# Patient Record
Sex: Male | Born: 1955
Health system: Southern US, Community
[De-identification: ages and names within clinical notes are randomized; demographics above are authoritative.]

## PROBLEM LIST (undated history)

## (undated) DIAGNOSIS — E039 Hypothyroidism, unspecified: Secondary | ICD-10-CM

## (undated) DIAGNOSIS — K219 Gastro-esophageal reflux disease without esophagitis: Secondary | ICD-10-CM

## (undated) DIAGNOSIS — C801 Malignant (primary) neoplasm, unspecified: Secondary | ICD-10-CM

## (undated) DIAGNOSIS — Z974 Presence of external hearing-aid: Secondary | ICD-10-CM

## (undated) DIAGNOSIS — Z972 Presence of dental prosthetic device (complete) (partial): Secondary | ICD-10-CM

## (undated) DIAGNOSIS — M1990 Unspecified osteoarthritis, unspecified site: Secondary | ICD-10-CM

## (undated) DIAGNOSIS — E079 Disorder of thyroid, unspecified: Secondary | ICD-10-CM

## (undated) DIAGNOSIS — I10 Essential (primary) hypertension: Secondary | ICD-10-CM

## (undated) DIAGNOSIS — E785 Hyperlipidemia, unspecified: Secondary | ICD-10-CM

## (undated) HISTORY — DX: Essential (primary) hypertension: I10

## (undated) HISTORY — PX: CHOLECYSTECTOMY: SHX55

## (undated) HISTORY — DX: Malignant (primary) neoplasm, unspecified: C80.1

## (undated) HISTORY — DX: Unspecified osteoarthritis, unspecified site: M19.90

## (undated) HISTORY — DX: Disorder of thyroid, unspecified: E07.9

## (undated) HISTORY — DX: Gastro-esophageal reflux disease without esophagitis: K21.9

## (undated) HISTORY — PX: COLONOSCOPY: SHX174

---

## 1991-09-28 HISTORY — PX: REPLACEMENT TOTAL KNEE: SUR1224

## 2001-09-27 DIAGNOSIS — C801 Malignant (primary) neoplasm, unspecified: Secondary | ICD-10-CM

## 2001-09-27 HISTORY — DX: Malignant (primary) neoplasm, unspecified: C80.1

## 2001-09-27 HISTORY — PX: SINUS SURGERY WITH INSTATRAK: SHX5215

## 2004-06-29 ENCOUNTER — Ambulatory Visit: Payer: Self-pay | Admitting: Oncology

## 2004-07-28 ENCOUNTER — Ambulatory Visit: Payer: Self-pay | Admitting: Oncology

## 2004-09-09 ENCOUNTER — Ambulatory Visit: Payer: Self-pay | Admitting: Otolaryngology

## 2004-10-27 ENCOUNTER — Ambulatory Visit: Payer: Self-pay | Admitting: Oncology

## 2004-11-30 ENCOUNTER — Other Ambulatory Visit: Payer: Self-pay

## 2004-12-07 ENCOUNTER — Inpatient Hospital Stay: Payer: Self-pay | Admitting: Unknown Physician Specialty

## 2005-01-26 ENCOUNTER — Ambulatory Visit: Payer: Self-pay | Admitting: Oncology

## 2005-02-25 ENCOUNTER — Ambulatory Visit: Payer: Self-pay | Admitting: Oncology

## 2005-05-10 ENCOUNTER — Ambulatory Visit: Payer: Self-pay | Admitting: Unknown Physician Specialty

## 2005-05-25 ENCOUNTER — Ambulatory Visit: Payer: Self-pay | Admitting: Pain Medicine

## 2005-06-08 ENCOUNTER — Ambulatory Visit: Payer: Self-pay | Admitting: Pain Medicine

## 2005-06-10 ENCOUNTER — Ambulatory Visit: Payer: Self-pay | Admitting: Oncology

## 2005-06-22 ENCOUNTER — Ambulatory Visit: Payer: Self-pay | Admitting: Pain Medicine

## 2005-06-27 ENCOUNTER — Ambulatory Visit: Payer: Self-pay | Admitting: Oncology

## 2005-07-06 ENCOUNTER — Ambulatory Visit: Payer: Self-pay | Admitting: Physician Assistant

## 2005-12-07 ENCOUNTER — Ambulatory Visit: Payer: Self-pay | Admitting: Oncology

## 2005-12-26 ENCOUNTER — Ambulatory Visit: Payer: Self-pay | Admitting: Oncology

## 2006-06-15 ENCOUNTER — Ambulatory Visit: Payer: Self-pay | Admitting: Oncology

## 2006-06-27 ENCOUNTER — Ambulatory Visit: Payer: Self-pay | Admitting: Oncology

## 2006-10-14 ENCOUNTER — Ambulatory Visit: Payer: Self-pay | Admitting: Ophthalmology

## 2006-12-21 ENCOUNTER — Ambulatory Visit: Payer: Self-pay | Admitting: Oncology

## 2006-12-27 ENCOUNTER — Ambulatory Visit: Payer: Self-pay | Admitting: Oncology

## 2007-03-10 ENCOUNTER — Emergency Department: Payer: Self-pay | Admitting: Emergency Medicine

## 2007-03-10 ENCOUNTER — Other Ambulatory Visit: Payer: Self-pay

## 2007-04-12 ENCOUNTER — Ambulatory Visit: Payer: Self-pay | Admitting: Unknown Physician Specialty

## 2007-05-29 ENCOUNTER — Ambulatory Visit: Payer: Self-pay | Admitting: Oncology

## 2007-06-21 ENCOUNTER — Ambulatory Visit: Payer: Self-pay | Admitting: Oncology

## 2007-06-28 ENCOUNTER — Ambulatory Visit: Payer: Self-pay | Admitting: Oncology

## 2007-06-29 ENCOUNTER — Ambulatory Visit: Payer: Self-pay | Admitting: Gastroenterology

## 2007-11-26 ENCOUNTER — Ambulatory Visit: Payer: Self-pay | Admitting: Oncology

## 2007-12-20 ENCOUNTER — Ambulatory Visit: Payer: Self-pay | Admitting: Oncology

## 2007-12-27 ENCOUNTER — Ambulatory Visit: Payer: Self-pay | Admitting: Oncology

## 2008-01-26 ENCOUNTER — Ambulatory Visit: Payer: Self-pay | Admitting: Oncology

## 2008-03-03 ENCOUNTER — Emergency Department: Payer: Self-pay | Admitting: Emergency Medicine

## 2008-06-27 ENCOUNTER — Ambulatory Visit: Payer: Self-pay | Admitting: Oncology

## 2008-07-25 ENCOUNTER — Ambulatory Visit: Payer: Self-pay | Admitting: Oncology

## 2008-07-28 ENCOUNTER — Ambulatory Visit: Payer: Self-pay | Admitting: Oncology

## 2008-12-26 ENCOUNTER — Ambulatory Visit: Payer: Self-pay | Admitting: Oncology

## 2009-01-23 ENCOUNTER — Ambulatory Visit: Payer: Self-pay | Admitting: Oncology

## 2009-01-25 ENCOUNTER — Ambulatory Visit: Payer: Self-pay | Admitting: Oncology

## 2009-02-25 ENCOUNTER — Ambulatory Visit: Payer: Self-pay | Admitting: Oncology

## 2009-03-08 ENCOUNTER — Ambulatory Visit: Payer: Self-pay | Admitting: Otolaryngology

## 2009-03-27 ENCOUNTER — Ambulatory Visit: Payer: Self-pay | Admitting: Oncology

## 2009-04-23 ENCOUNTER — Ambulatory Visit: Payer: Self-pay | Admitting: Oncology

## 2009-04-27 ENCOUNTER — Ambulatory Visit: Payer: Self-pay | Admitting: Oncology

## 2009-08-07 ENCOUNTER — Ambulatory Visit: Payer: Self-pay | Admitting: Unknown Physician Specialty

## 2009-08-27 ENCOUNTER — Ambulatory Visit: Payer: Self-pay | Admitting: Oncology

## 2009-08-29 ENCOUNTER — Ambulatory Visit: Payer: Self-pay | Admitting: Oncology

## 2009-09-27 ENCOUNTER — Ambulatory Visit: Payer: Self-pay | Admitting: Oncology

## 2009-09-27 HISTORY — PX: BACK SURGERY: SHX140

## 2009-10-06 ENCOUNTER — Ambulatory Visit: Payer: Self-pay | Admitting: Otolaryngology

## 2010-01-12 ENCOUNTER — Emergency Department: Payer: Self-pay | Admitting: Emergency Medicine

## 2010-02-25 ENCOUNTER — Ambulatory Visit: Payer: Self-pay | Admitting: Oncology

## 2010-02-27 ENCOUNTER — Ambulatory Visit: Payer: Self-pay | Admitting: Oncology

## 2010-03-27 ENCOUNTER — Ambulatory Visit: Payer: Self-pay | Admitting: Oncology

## 2010-05-20 ENCOUNTER — Ambulatory Visit: Payer: Self-pay | Admitting: Otolaryngology

## 2010-07-13 ENCOUNTER — Ambulatory Visit: Payer: Self-pay | Admitting: Oncology

## 2010-07-28 ENCOUNTER — Ambulatory Visit: Payer: Self-pay | Admitting: Oncology

## 2011-01-12 ENCOUNTER — Ambulatory Visit: Payer: Self-pay | Admitting: Oncology

## 2011-01-26 ENCOUNTER — Ambulatory Visit: Payer: Self-pay | Admitting: Oncology

## 2011-07-15 ENCOUNTER — Ambulatory Visit: Payer: Self-pay | Admitting: Oncology

## 2011-07-29 ENCOUNTER — Ambulatory Visit: Payer: Self-pay | Admitting: Oncology

## 2011-11-29 ENCOUNTER — Ambulatory Visit: Payer: Self-pay | Admitting: Unknown Physician Specialty

## 2011-12-10 ENCOUNTER — Ambulatory Visit: Payer: Self-pay | Admitting: Unknown Physician Specialty

## 2011-12-10 LAB — URINALYSIS, COMPLETE
Bacteria: NONE SEEN
Bilirubin,UR: NEGATIVE
Blood: NEGATIVE
Glucose,UR: NEGATIVE mg/dL (ref 0–75)
Ketone: NEGATIVE
Leukocyte Esterase: NEGATIVE
Nitrite: NEGATIVE
Ph: 6 (ref 4.5–8.0)
Protein: NEGATIVE
RBC,UR: 1 /HPF (ref 0–5)
Specific Gravity: 1.012 (ref 1.003–1.030)
Squamous Epithelial: NONE SEEN
WBC UR: 1 /HPF (ref 0–5)

## 2011-12-10 LAB — BASIC METABOLIC PANEL
Anion Gap: 12 (ref 7–16)
BUN: 12 mg/dL (ref 7–18)
Calcium, Total: 9.2 mg/dL (ref 8.5–10.1)
Chloride: 100 mmol/L (ref 98–107)
Co2: 26 mmol/L (ref 21–32)
Creatinine: 0.92 mg/dL (ref 0.60–1.30)
EGFR (African American): >60
EGFR (Non-African Amer.): >60
Glucose: 107 mg/dL — ABNORMAL HIGH (ref 65–99)
Osmolality: 276 (ref 275–301)
Potassium: 3.7 mmol/L (ref 3.5–5.1)
Sodium: 138 mmol/L (ref 136–145)

## 2011-12-10 LAB — CBC
HCT: 39.9 % — ABNORMAL LOW (ref 40.0–52.0)
HGB: 13.5 g/dL (ref 13.0–18.0)
MCH: 30.1 pg (ref 26.0–34.0)
MCHC: 34 g/dL (ref 32.0–36.0)
MCV: 89 fL (ref 80–100)
Platelet: 280 10*3/uL (ref 150–440)
RBC: 4.5 10*6/uL (ref 4.40–5.90)
RDW: 13.6 % (ref 11.5–14.5)
WBC: 5.9 10*3/uL (ref 3.8–10.6)

## 2011-12-10 LAB — MRSA PCR SCREENING

## 2011-12-17 ENCOUNTER — Ambulatory Visit: Payer: Self-pay | Admitting: Unknown Physician Specialty

## 2012-01-13 ENCOUNTER — Ambulatory Visit: Payer: Self-pay | Admitting: Oncology

## 2012-01-13 LAB — COMPREHENSIVE METABOLIC PANEL
Albumin: 4.4 g/dL (ref 3.4–5.0)
Alkaline Phosphatase: 71 U/L (ref 50–136)
Anion Gap: 7 (ref 7–16)
BUN: 15 mg/dL (ref 7–18)
Bilirubin,Total: 0.4 mg/dL (ref 0.2–1.0)
Calcium, Total: 9.1 mg/dL (ref 8.5–10.1)
Chloride: 102 mmol/L (ref 98–107)
Co2: 29 mmol/L (ref 21–32)
Creatinine: 1 mg/dL (ref 0.60–1.30)
EGFR (African American): >60
EGFR (Non-African Amer.): >60
Glucose: 100 mg/dL — ABNORMAL HIGH (ref 65–99)
Osmolality: 277 (ref 275–301)
Potassium: 3.7 mmol/L (ref 3.5–5.1)
SGOT(AST): 23 U/L (ref 15–37)
SGPT (ALT): 32 U/L
Sodium: 138 mmol/L (ref 136–145)
Total Protein: 7.6 g/dL (ref 6.4–8.2)

## 2012-01-13 LAB — CBC CANCER CENTER
Basophil #: 0 x10 3/mm (ref 0.0–0.1)
Basophil %: 0.9 %
Eosinophil #: 0.2 x10 3/mm (ref 0.0–0.7)
Eosinophil %: 4.1 %
HCT: 40.6 % (ref 40.0–52.0)
HGB: 13.5 g/dL (ref 13.0–18.0)
Lymphocyte #: 2.2 x10 3/mm (ref 1.0–3.6)
Lymphocyte %: 39.6 %
MCH: 29.5 pg (ref 26.0–34.0)
MCHC: 33.3 g/dL (ref 32.0–36.0)
MCV: 88 fL (ref 80–100)
Monocyte #: 0.4 x10 3/mm (ref 0.2–1.0)
Monocyte %: 7.7 %
Neutrophil #: 2.6 x10 3/mm (ref 1.4–6.5)
Neutrophil %: 47.7 %
Platelet: 250 x10 3/mm (ref 150–440)
RBC: 4.59 10*6/uL (ref 4.40–5.90)
RDW: 13.7 % (ref 11.5–14.5)
WBC: 5.5 x10 3/mm (ref 3.8–10.6)

## 2012-01-13 LAB — TSH: Thyroid Stimulating Horm: 1.18 u[IU]/mL

## 2012-01-26 ENCOUNTER — Ambulatory Visit: Payer: Self-pay | Admitting: Oncology

## 2012-07-18 ENCOUNTER — Ambulatory Visit: Payer: Self-pay | Admitting: Oncology

## 2012-07-18 LAB — COMPREHENSIVE METABOLIC PANEL
Albumin: 4.3 g/dL (ref 3.4–5.0)
Alkaline Phosphatase: 79 U/L (ref 50–136)
Anion Gap: 10 (ref 7–16)
BUN: 10 mg/dL (ref 7–18)
Bilirubin,Total: 0.5 mg/dL (ref 0.2–1.0)
Calcium, Total: 9.3 mg/dL (ref 8.5–10.1)
Chloride: 101 mmol/L (ref 98–107)
Co2: 27 mmol/L (ref 21–32)
Creatinine: 1.03 mg/dL (ref 0.60–1.30)
EGFR (African American): >60
EGFR (Non-African Amer.): >60
Glucose: 94 mg/dL (ref 65–99)
Osmolality: 274 (ref 275–301)
Potassium: 4.1 mmol/L (ref 3.5–5.1)
SGOT(AST): 21 U/L (ref 15–37)
SGPT (ALT): 35 U/L (ref 12–78)
Sodium: 138 mmol/L (ref 136–145)
Total Protein: 7.5 g/dL (ref 6.4–8.2)

## 2012-07-18 LAB — CBC CANCER CENTER
Basophil #: 0.1 x10 3/mm (ref 0.0–0.1)
Basophil %: 1 %
Eosinophil #: 0.1 x10 3/mm (ref 0.0–0.7)
Eosinophil %: 2.3 %
HCT: 40.6 % (ref 40.0–52.0)
HGB: 13.5 g/dL (ref 13.0–18.0)
Lymphocyte #: 1.9 x10 3/mm (ref 1.0–3.6)
Lymphocyte %: 32.4 %
MCH: 29 pg (ref 26.0–34.0)
MCHC: 33.2 g/dL (ref 32.0–36.0)
MCV: 87 fL (ref 80–100)
Monocyte #: 0.6 x10 3/mm (ref 0.2–1.0)
Monocyte %: 11 %
Neutrophil #: 3.1 x10 3/mm (ref 1.4–6.5)
Neutrophil %: 53.3 %
Platelet: 273 x10 3/mm (ref 150–440)
RBC: 4.65 10*6/uL (ref 4.40–5.90)
RDW: 13.5 % (ref 11.5–14.5)
WBC: 5.7 x10 3/mm (ref 3.8–10.6)

## 2012-07-18 LAB — TSH: Thyroid Stimulating Horm: 0.625 u[IU]/mL

## 2012-07-28 ENCOUNTER — Ambulatory Visit: Payer: Self-pay | Admitting: Oncology

## 2012-11-22 HISTORY — PX: BREAST SURGERY: SHX581

## 2012-12-05 ENCOUNTER — Encounter: Payer: Self-pay | Admitting: *Deleted

## 2012-12-14 ENCOUNTER — Encounter: Payer: Self-pay | Admitting: General Surgery

## 2012-12-14 ENCOUNTER — Ambulatory Visit (INDEPENDENT_AMBULATORY_CARE_PROVIDER_SITE_OTHER): Payer: PRIVATE HEALTH INSURANCE | Admitting: General Surgery

## 2012-12-14 VITALS — BP 138/90 | HR 88 | Resp 14 | Ht 73.0 in | Wt 162.0 lb

## 2012-12-14 DIAGNOSIS — N62 Hypertrophy of breast: Secondary | ICD-10-CM

## 2012-12-14 NOTE — Progress Notes (Addendum)
Subjective:     Patient ID: Marcus Wright, male   DOB: 1955-12-29, 57 y.o.   MRN: [id number]  HPI Patient here today following up from a right breast biopsy done 11-22-12, benign. Currently being treated for ear infection but has not started medications yet saw Dr Willeen Cass today.   Review of Systems  Constitutional: Negative.   HENT: Positive for ear pain.   Respiratory: Negative.   Cardiovascular: Negative.        Objective:   Physical Exam Thickening behind right areolar areais about 2.5-3.0 cm in diameter and less tenderthat at the time of his original evaluation on February 26.     Assessment:     Gynecomastia     Plan:     The options for management were reviewed: 1) expectant management as he has had a previous episode resolved spontaneously in the past versus 2) treatment with tamoxifen. The slight risk of DVT with this medication was reviewed.  As the patient is presently asymptomatic, observation will be planned. He was encouraged to call if he develops discomfort in the interval or appreciates an increasing size. We'll plan for a repeat exam in 6 months with an ultrasound at that time.

## 2012-12-14 NOTE — Patient Instructions (Addendum)
Discussed with patient hormone therapy vs conservative observation.

## 2013-03-23 ENCOUNTER — Ambulatory Visit: Payer: Self-pay | Admitting: Otolaryngology

## 2013-06-20 ENCOUNTER — Other Ambulatory Visit (INDEPENDENT_AMBULATORY_CARE_PROVIDER_SITE_OTHER): Payer: Medicare Other

## 2013-06-20 ENCOUNTER — Encounter: Payer: Self-pay | Admitting: General Surgery

## 2013-06-20 ENCOUNTER — Ambulatory Visit (INDEPENDENT_AMBULATORY_CARE_PROVIDER_SITE_OTHER): Payer: Medicare Other | Admitting: General Surgery

## 2013-06-20 VITALS — BP 136/74 | HR 78 | Resp 12 | Ht 73.0 in | Wt 196.0 lb

## 2013-06-20 DIAGNOSIS — N62 Hypertrophy of breast: Secondary | ICD-10-CM | POA: Insufficient documentation

## 2013-06-20 NOTE — Progress Notes (Signed)
Patient ID: Marcus Wright, male   DOB: [birth year], 57 y.o.   MRN: [id number]  Chief Complaint  Patient presents with  . Follow-up    office ultrasound    HPI Marcus Wright is a 57 y.o. male.  Here today for right breast ultrasound.  A right breast biopsy was done 11-22-12, showing gynecomastia.  The last few months he does feel that the left breast is developing a focal area of thickening similar to that which had previously been identified on the right.  He is having difficulty with sinus infections presently being managed by the ENT service. 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  HPI  Past Medical History  Diagnosis Date  . Hypertension   . Cancer 2003    skin'  . Thyroid disorder     Past Surgical History  Procedure Laterality Date  . Sinus surgery with instatrak  2003    cancer  . Back surgery  2011  . Replacement total knee Right 1993  . Cholecystectomy    . Colonoscopy    . Breast surgery Right 11-22-12    History reviewed. No pertinent family history.  Social History History  Substance Use Topics  . Smoking status: Never Smoker   . Smokeless tobacco: Never Used  . Alcohol Use: No    No Known Allergies  Current Outpatient Prescriptions  Medication Sig Dispense Refill  . amLODipine (NORVASC) 5 MG tablet Take 5 mg by mouth as needed.      Marland Kitchen esomeprazole (NEXIUM) 20 MG capsule Take 20 mg by mouth daily before breakfast.      . levothyroxine (SYNTHROID, LEVOTHROID) 75 MCG tablet Take 75 mcg by mouth daily.       No current facility-administered medications for this visit.    Review of Systems Review of Systems  Constitutional: Negative.   Respiratory: Negative.   Cardiovascular: Negative.     Blood pressure 136/74, pulse 78, resp. rate 12, height 6\' 1"  (1.854 m), weight 196 lb (88.905 kg).  Physical Exam Physical Exam  Constitutional: He is oriented to person, place, and time. He appears  well-developed and well-nourished.  Neck: No thyromegaly present.  Cardiovascular: Normal rate, regular rhythm and normal heart sounds.   No murmur heard. Pulmonary/Chest: Effort normal and breath sounds normal.  There is modest thickening behind the left areola measuring less than 2 cm in diameter this is fairly soft   Minimal thickening behind the right areola, much less prominent than prior to his biopsy.  Lymphadenopathy:    He has no cervical adenopathy.    He has no axillary adenopathy.  Neurological: He is alert and oriented to person, place, and time.  Skin: Skin is dry.    Data Reviewed Ultrasound examination of the retroareolar area of both breasts was completed. On the left side the tissue behind the nipple/areolar complex measures 1.25 x 1.5 one bite 1.1 cm in maximum dimension.  In the right breast the area measures 1.26 x 1.43 x 1.79 cm.  At the time of his March 2014 examination ultrasound examination of the right breast showed an irregular hypoechoic area measuring 1 x 1.5 x 2.5 cm.   Assessment    Gynecomastia    Plan    Considering the previous right breast biopsy showed only gynecomastia, and the similar clinical and ultrasound appearance on today's exam for the left breast, observation as planned. A repeat ultrasound of both breasts will be scheduled in 6 months.  Earline Mayotte 06/21/2013, 7:13 AM

## 2013-06-20 NOTE — Addendum Note (Signed)
Addended by: Currie Paris on: 06/20/2013 04:17 PM   Modules accepted: Medications

## 2013-06-21 ENCOUNTER — Encounter: Payer: Self-pay | Admitting: General Surgery

## 2013-08-31 ENCOUNTER — Emergency Department: Payer: Self-pay | Admitting: Emergency Medicine

## 2013-12-17 ENCOUNTER — Encounter: Payer: Self-pay | Admitting: General Surgery

## 2013-12-17 ENCOUNTER — Ambulatory Visit (INDEPENDENT_AMBULATORY_CARE_PROVIDER_SITE_OTHER): Payer: Medicare Other | Admitting: General Surgery

## 2013-12-17 VITALS — BP 120/74 | HR 74 | Resp 12 | Ht 73.0 in | Wt 197.0 lb

## 2013-12-17 DIAGNOSIS — N62 Hypertrophy of breast: Secondary | ICD-10-CM

## 2013-12-17 NOTE — Patient Instructions (Signed)
Patient to return as needed. The patient is aware to call back for any questions or concerns. 

## 2013-12-17 NOTE — Progress Notes (Signed)
Patient ID: Marcus Wright, male   DOB: 1956-07-14, 58 y.o.   MRN: [id number]  Chief Complaint  Patient presents with  . Follow-up    follow up for gynocomastia     HPI Marcus Wright is a 58 y.o. male who presents for a 6 month for gynecomastia. The patient denies any new breast problems at this time. He states he thinks his lumps have resolved themselves.   HPI  Past Medical History  Diagnosis Date  . Hypertension   . Cancer 2003    skin'  . Thyroid disorder     Past Surgical History  Procedure Laterality Date  . Sinus surgery with instatrak  2003    cancer  . Back surgery  2011  . Replacement total knee Right 1993  . Cholecystectomy    . Colonoscopy    . Breast surgery Right 11-22-12    No family history on file.  Social History History  Substance Use Topics  . Smoking status: Never Smoker   . Smokeless tobacco: Never Used  . Alcohol Use: No    No Known Allergies  Current Outpatient Prescriptions  Medication Sig Dispense Refill  . amLODipine (NORVASC) 5 MG tablet Take 5 mg by mouth as needed.      Marland Kitchen esomeprazole (NEXIUM) 20 MG capsule Take 20 mg by mouth daily before breakfast.      . levothyroxine (SYNTHROID, LEVOTHROID) 75 MCG tablet Take 75 mcg by mouth daily.       No current facility-administered medications for this visit.    Review of Systems Review of Systems  Constitutional: Negative.   Respiratory: Negative.   Cardiovascular: Negative.     Blood pressure 120/74, pulse 74, resp. rate 12, height 6\' 1"  (1.854 m), weight 197 lb (89.359 kg).  Physical Exam Physical Exam  Constitutional: He is oriented to person, place, and time. He appears well-developed and well-nourished.  Neck: Neck supple. No thyromegaly present.  Cardiovascular: Normal rate, regular rhythm and normal heart sounds.   No murmur heard. Pulmonary/Chest: Effort normal and breath sounds normal. Right breast exhibits no inverted nipple, no mass, no nipple discharge, no skin change  and no tenderness. Left breast exhibits no inverted nipple, no mass, no nipple discharge, no skin change and no tenderness.    Lymphadenopathy:    He has no cervical adenopathy.    He has no axillary adenopathy.  Neurological: He is alert and oriented to person, place, and time.  Skin: Skin is warm and dry.    Data Reviewed November 22, 2012 right retroareolar tissue biopsy showed evidence of gynecomastia.  Assessment    Benign breast exam. No evidence of nodularity or pronounced thickening working repeat ultrasound.     Plan    The patient was encouraged to call should he develop any recurrent symptoms or appreciate any thickening. Otherwise he'll return on an as needed basis.        Marcus Wright 12/17/2013, 9:04 PM

## 2013-12-19 ENCOUNTER — Encounter: Payer: Self-pay | Admitting: General Surgery

## 2014-02-07 DIAGNOSIS — M5410 Radiculopathy, site unspecified: Secondary | ICD-10-CM | POA: Insufficient documentation

## 2014-02-07 DIAGNOSIS — Z9889 Other specified postprocedural states: Secondary | ICD-10-CM | POA: Insufficient documentation

## 2014-02-07 DIAGNOSIS — M5412 Radiculopathy, cervical region: Secondary | ICD-10-CM | POA: Insufficient documentation

## 2014-02-07 DIAGNOSIS — S161XXA Strain of muscle, fascia and tendon at neck level, initial encounter: Secondary | ICD-10-CM | POA: Insufficient documentation

## 2014-02-27 DIAGNOSIS — S299XXA Unspecified injury of thorax, initial encounter: Secondary | ICD-10-CM | POA: Insufficient documentation

## 2014-02-27 DIAGNOSIS — IMO0002 Reserved for concepts with insufficient information to code with codable children: Secondary | ICD-10-CM | POA: Insufficient documentation

## 2014-06-06 ENCOUNTER — Ambulatory Visit: Payer: Self-pay | Admitting: Orthopedic Surgery

## 2014-07-04 DIAGNOSIS — J301 Allergic rhinitis due to pollen: Secondary | ICD-10-CM | POA: Insufficient documentation

## 2014-11-04 DIAGNOSIS — H6981 Other specified disorders of Eustachian tube, right ear: Secondary | ICD-10-CM | POA: Diagnosis not present

## 2014-11-04 DIAGNOSIS — H6531 Chronic mucoid otitis media, right ear: Secondary | ICD-10-CM | POA: Diagnosis not present

## 2014-11-04 DIAGNOSIS — H902 Conductive hearing loss, unspecified: Secondary | ICD-10-CM | POA: Diagnosis not present

## 2014-11-27 DIAGNOSIS — E039 Hypothyroidism, unspecified: Secondary | ICD-10-CM | POA: Diagnosis not present

## 2014-11-27 DIAGNOSIS — M1990 Unspecified osteoarthritis, unspecified site: Secondary | ICD-10-CM | POA: Diagnosis not present

## 2014-11-27 DIAGNOSIS — Z23 Encounter for immunization: Secondary | ICD-10-CM | POA: Diagnosis not present

## 2014-11-27 DIAGNOSIS — N400 Enlarged prostate without lower urinary tract symptoms: Secondary | ICD-10-CM | POA: Diagnosis not present

## 2014-11-27 DIAGNOSIS — I10 Essential (primary) hypertension: Secondary | ICD-10-CM | POA: Diagnosis not present

## 2014-11-27 DIAGNOSIS — R12 Heartburn: Secondary | ICD-10-CM | POA: Diagnosis not present

## 2014-11-28 DIAGNOSIS — E039 Hypothyroidism, unspecified: Secondary | ICD-10-CM | POA: Diagnosis not present

## 2014-12-09 DIAGNOSIS — H9193 Unspecified hearing loss, bilateral: Secondary | ICD-10-CM | POA: Diagnosis not present

## 2014-12-09 DIAGNOSIS — H6504 Acute serous otitis media, recurrent, right ear: Secondary | ICD-10-CM | POA: Diagnosis not present

## 2014-12-30 DIAGNOSIS — H6504 Acute serous otitis media, recurrent, right ear: Secondary | ICD-10-CM | POA: Diagnosis not present

## 2014-12-31 DIAGNOSIS — H40001 Preglaucoma, unspecified, right eye: Secondary | ICD-10-CM | POA: Diagnosis not present

## 2015-01-02 DIAGNOSIS — H40001 Preglaucoma, unspecified, right eye: Secondary | ICD-10-CM | POA: Diagnosis not present

## 2015-01-13 DIAGNOSIS — H663X1 Other chronic suppurative otitis media, right ear: Secondary | ICD-10-CM | POA: Diagnosis not present

## 2015-01-13 DIAGNOSIS — H6531 Chronic mucoid otitis media, right ear: Secondary | ICD-10-CM | POA: Diagnosis not present

## 2015-01-13 DIAGNOSIS — H906 Mixed conductive and sensorineural hearing loss, bilateral: Secondary | ICD-10-CM | POA: Diagnosis not present

## 2015-01-13 DIAGNOSIS — Z79899 Other long term (current) drug therapy: Secondary | ICD-10-CM | POA: Diagnosis not present

## 2015-01-13 DIAGNOSIS — Z923 Personal history of irradiation: Secondary | ICD-10-CM | POA: Diagnosis not present

## 2015-01-13 DIAGNOSIS — Z87891 Personal history of nicotine dependence: Secondary | ICD-10-CM | POA: Diagnosis not present

## 2015-01-19 NOTE — Op Note (Signed)
PATIENT NAME:  Marcus Wright, Marcus Wright MR#:  [id number] DATE OF BIRTH:  1956/07/02  DATE OF PROCEDURE:  12/17/2011  PREOPERATIVE DIAGNOSIS: Right L5 radiculopathy with L4-L5 spondylosis.   POSTOPERATIVE DIAGNOSIS: Right L5 radiculopathy with L4-L5 spondylosis.   PROCEDURE PERFORMED: Right L4-L5 microdiskectomy, partial facetectomy and foraminotomy with diskectomy.   SURGEON: Alysia Penna. Vernette Moise, MD   ASSISTANT: None.   ANESTHESIA: General.   ESTIMATED BLOOD LOSS: 25 mL.  REPLACEMENT: 1100 mL of crystalloid.   DRAINS: None.   COMPLICATIONS: None.   BRIEF CLINICAL NOTE AND PATHOLOGY: The patient had persistent radiating leg pain unresponsive to conservative treatment. Work-up showed evidence of lateral disk at L4-L5. The options, risks, and benefits were discussed, and the patient elected to proceed with the operative procedure. At time of the procedure, there was significant lateral disk protrusion as well as facet hypertrophy. There was significant compression of the L5 nerve root.   DESCRIPTION OF PROCEDURE: Preop antibiotics, adequate general anesthesia, prone position, all prominences well padded. Routine prepping and draping. Appropriate timeout was called. With the use of the C-arm and a spinal needle, appropriate level was marked. Routine posterior approach was made. The fascia as opened in line with the incision. Subperiosteal dissection was performed. Self-retaining retractor was placed. Lateral fluoroscopy again was used to identify the appropriate level.   The microscope was brought onto the field. The bur was used, partial facetectomy and foraminotomy were performed, and the ligamentum flavum was carefully elevated. The nerve root was decompressed, was mobilized medially. Disk space was entered through the lateral protrusion. Cleaning was continued until three passes revealed no further disk material. The area was thoroughly irrigated, hemostasis was good. The area was covered with Surgiflo.  The fascia was closed with #2 quill. Subcutaneous was closed with 0 quill. The skin was closed with skin glue. A soft sterile dressing was applied. Sponge, needle and instrument counts were reported as correct prior to and after wound closure. The patient was awakened and taken to the postanesthesia care unit having tolerated the procedure well.   ____________________________ Alysia Penna. Mauri Pole, MD jcc:cbb D: 12/17/2011 18:05:45 ET T: 12/18/2011 12:13:42 ET JOB#: [id number]  cc: Alysia Penna. Mauri Pole, MD, <Dictator> Alysia Penna Nathanael Krist MD ELECTRONICALLY SIGNED 12/20/2011 9:21

## 2015-03-03 DIAGNOSIS — Z9889 Other specified postprocedural states: Secondary | ICD-10-CM | POA: Diagnosis not present

## 2015-03-03 DIAGNOSIS — H9193 Unspecified hearing loss, bilateral: Secondary | ICD-10-CM | POA: Diagnosis not present

## 2015-03-03 DIAGNOSIS — H906 Mixed conductive and sensorineural hearing loss, bilateral: Secondary | ICD-10-CM | POA: Diagnosis not present

## 2015-03-03 DIAGNOSIS — H6531 Chronic mucoid otitis media, right ear: Secondary | ICD-10-CM | POA: Diagnosis not present

## 2015-03-03 DIAGNOSIS — Z85819 Personal history of malignant neoplasm of unspecified site of lip, oral cavity, and pharynx: Secondary | ICD-10-CM | POA: Diagnosis not present

## 2015-04-30 DIAGNOSIS — H6531 Chronic mucoid otitis media, right ear: Secondary | ICD-10-CM | POA: Diagnosis not present

## 2015-04-30 DIAGNOSIS — H6981 Other specified disorders of Eustachian tube, right ear: Secondary | ICD-10-CM | POA: Diagnosis not present

## 2015-04-30 DIAGNOSIS — H902 Conductive hearing loss, unspecified: Secondary | ICD-10-CM | POA: Diagnosis not present

## 2015-05-20 DIAGNOSIS — H903 Sensorineural hearing loss, bilateral: Secondary | ICD-10-CM | POA: Insufficient documentation

## 2015-07-03 DIAGNOSIS — H40001 Preglaucoma, unspecified, right eye: Secondary | ICD-10-CM | POA: Diagnosis not present

## 2015-07-31 DIAGNOSIS — H6533 Chronic mucoid otitis media, bilateral: Secondary | ICD-10-CM | POA: Diagnosis not present

## 2015-07-31 DIAGNOSIS — H6983 Other specified disorders of Eustachian tube, bilateral: Secondary | ICD-10-CM | POA: Diagnosis not present

## 2015-07-31 DIAGNOSIS — R1314 Dysphagia, pharyngoesophageal phase: Secondary | ICD-10-CM | POA: Diagnosis not present

## 2015-07-31 DIAGNOSIS — H902 Conductive hearing loss, unspecified: Secondary | ICD-10-CM | POA: Diagnosis not present

## 2015-09-10 DIAGNOSIS — J069 Acute upper respiratory infection, unspecified: Secondary | ICD-10-CM | POA: Diagnosis not present

## 2015-09-10 DIAGNOSIS — J040 Acute laryngitis: Secondary | ICD-10-CM | POA: Diagnosis not present

## 2015-10-01 DIAGNOSIS — H6531 Chronic mucoid otitis media, right ear: Secondary | ICD-10-CM | POA: Diagnosis not present

## 2015-10-01 DIAGNOSIS — H6981 Other specified disorders of Eustachian tube, right ear: Secondary | ICD-10-CM | POA: Diagnosis not present

## 2015-10-01 DIAGNOSIS — H902 Conductive hearing loss, unspecified: Secondary | ICD-10-CM | POA: Diagnosis not present

## 2015-10-01 DIAGNOSIS — J019 Acute sinusitis, unspecified: Secondary | ICD-10-CM | POA: Diagnosis not present

## 2015-10-02 ENCOUNTER — Other Ambulatory Visit: Payer: Self-pay | Admitting: Family Medicine

## 2015-10-02 MED ORDER — LEVOTHYROXINE SODIUM 50 MCG PO TABS: 50.0000 ug | ORAL_TABLET | Freq: Every day | ORAL | Status: DC

## 2015-10-09 ENCOUNTER — Other Ambulatory Visit: Payer: Self-pay | Admitting: Family Medicine

## 2015-11-06 ENCOUNTER — Encounter: Payer: Self-pay | Admitting: Family Medicine

## 2015-11-06 ENCOUNTER — Ambulatory Visit (INDEPENDENT_AMBULATORY_CARE_PROVIDER_SITE_OTHER): Payer: Medicare Other | Admitting: Family Medicine

## 2015-11-06 VITALS — BP 115/74 | HR 66 | Temp 97.6°F | Ht 73.4 in | Wt 176.0 lb

## 2015-11-06 DIAGNOSIS — Z23 Encounter for immunization: Secondary | ICD-10-CM | POA: Diagnosis not present

## 2015-11-06 DIAGNOSIS — E079 Disorder of thyroid, unspecified: Secondary | ICD-10-CM | POA: Diagnosis not present

## 2015-11-06 DIAGNOSIS — Z8522 Personal history of malignant neoplasm of nasal cavities, middle ear, and accessory sinuses: Secondary | ICD-10-CM | POA: Diagnosis not present

## 2015-11-06 DIAGNOSIS — E039 Hypothyroidism, unspecified: Secondary | ICD-10-CM | POA: Insufficient documentation

## 2015-11-06 DIAGNOSIS — H66006 Acute suppurative otitis media without spontaneous rupture of ear drum, recurrent, bilateral: Secondary | ICD-10-CM | POA: Diagnosis not present

## 2015-11-06 DIAGNOSIS — I10 Essential (primary) hypertension: Secondary | ICD-10-CM | POA: Diagnosis not present

## 2015-11-06 MED ORDER — AMOXICILLIN-POT CLAVULANATE 875-125 MG PO TABS: 1.0000 | ORAL_TABLET | Freq: Two times a day (BID) | ORAL | Status: DC

## 2015-11-06 NOTE — Assessment & Plan Note (Signed)
No longer following with ONC. Following with ENT. Continue to monitor.

## 2015-11-06 NOTE — Progress Notes (Signed)
BP 115/74 mmHg  Pulse 66  Temp(Src) 97.6 F (36.4 C)  Ht 6' 1.4" (1.864 m)  Wt 176 lb (79.833 kg)  BMI 22.98 kg/m2  SpO2 100%   Subjective:    Patient ID: Marcus Wright, male    DOB: Feb 04, 1956, 60 y.o.   MRN: [id number]  HPI: Marcus Wright is a 60 y.o. male  Chief Complaint  Patient presents with  . Hypothyroidism  . Ear Pain    Right Ear   HYPOTHYROIDISM Thyroid control status:controlled Satisfied with current treatment? yes Medication side effects: no Medication compliance: excellent compliance Etiology of hypothyroidism: S/p radiation for the sinus cancer Recent dose adjustment:no Fatigue: yes Cold intolerance: yes Heat intolerance: yes Weight gain: no Weight loss: yes Constipation: yes Diarrhea/loose stools: no Palpitations: yes Lower extremity edema: no Anxiety/depressed mood: no  HYPERTENSION Hypertension status: controlled  Previous BP meds: amlodipine Aspirin: no Recurrent headaches: no Visual changes: no Palpitations: yes Dyspnea: no Chest pain: no Lower extremity edema: no Dizzy/lightheaded: no  EAR PAIN Duration: couple of days Involved ear(s): right Severity:  mild  Quality:  dull and sore Fever: no Otorrhea: no Upper respiratory infection symptoms: no Pruritus: no Hearing loss: yes Water immersion no Using Q-tips: no Recurrent otitis media: yes Status: worse Treatments attempted: none  Relevant past medical, surgical, family and social history reviewed and updated as indicated. Interim medical history since our last visit reviewed. Allergies and medications reviewed and updated.  Review of Systems  Constitutional: Negative.   HENT: Positive for ear pain and hearing loss. Negative for congestion, dental problem, drooling, ear discharge, facial swelling, mouth sores, nosebleeds, postnasal drip, rhinorrhea, sinus pressure, sneezing, sore throat, tinnitus, trouble swallowing and voice change.   Respiratory: Negative.    Cardiovascular: Negative.   Endocrine: Positive for cold intolerance and heat intolerance. Negative for polydipsia, polyphagia and polyuria.  Psychiatric/Behavioral: Negative.     Per HPI unless specifically indicated above     Objective:    BP 115/74 mmHg  Pulse 66  Temp(Src) 97.6 F (36.4 C)  Ht 6' 1.4" (1.864 m)  Wt 176 lb (79.833 kg)  BMI 22.98 kg/m2  SpO2 100%  Wt Readings from Last 3 Encounters:  11/06/15 176 lb (79.833 kg)  12/17/13 197 lb (89.359 kg)  06/20/13 196 lb (88.905 kg)    Physical Exam  Constitutional: He is oriented to person, place, and time. He appears well-developed and well-nourished. No distress.  HENT:  Head: Normocephalic and atraumatic.  Right Ear: Hearing normal. Tympanic membrane is injected, scarred and bulging.  Left Ear: Hearing normal. Tympanic membrane is injected, scarred and bulging.  Nose: Nose normal.  Mouth/Throat: Oropharynx is clear and moist. No oropharyngeal exudate.  Eyes: Conjunctivae, EOM and lids are normal. Pupils are equal, round, and reactive to light. Right eye exhibits no discharge. Left eye exhibits no discharge. No scleral icterus.  Cardiovascular: Normal rate, regular rhythm, normal heart sounds and intact distal pulses.  Exam reveals no gallop and no friction rub.   No murmur heard. Pulmonary/Chest: Effort normal and breath sounds normal. No respiratory distress. He has no wheezes. He has no rales. He exhibits no tenderness.  Musculoskeletal: Normal range of motion.  Neurological: He is alert and oriented to person, place, and time.  Skin: Skin is warm, dry and intact. No rash noted. No erythema. No pallor.  Psychiatric: He has a normal mood and affect. His speech is normal and behavior is normal. Judgment and thought content normal. Cognition and memory are normal.  Nursing note and vitals reviewed.   Results for orders placed or performed in visit on 07/18/12  Floyd  Result Value Ref Range   WBC 5.7  3.8-10.6 x10 3/mm    RBC 4.65 4.40-5.90 x10 6/mm    HGB 13.5 13.0-18.0 g/dL   HCT 40.6 40.0-52.0 %   MCV 87 80-100 fL   MCH 29.0 26.0-34.0 pg   MCHC 33.2 32.0-36.0 g/dL   RDW 13.5 11.5-14.5 %   Platelet 273 150-440 x10 3/mm    Neutrophil % 53.3 %   Lymphocyte % 32.4 %   Monocyte % 11.0 %   Eosinophil % 2.3 %   Basophil % 1.0 %   Neutrophil # 3.1 1.4-6.5 x10 3/mm    Lymphocyte # 1.9 1.0-3.6 x10 3/mm    Monocyte # 0.6 0.2-1.0 x10 3/mm    Eosinophil # 0.1 0.0-0.7 x10 3/mm    Basophil # 0.1 0.0-0.1 x10 3/mm   Comprehensive metabolic panel  Result Value Ref Range   Glucose 94 65-99 mg/dL   BUN 10 7-18 mg/dL   Creatinine 1.03 0.60-1.30 mg/dL   Sodium 138 136-145 mmol/L   Potassium 4.1 3.5-5.1 mmol/L   Chloride 101 98-107 mmol/L   Co2 27 21-32 mmol/L   Calcium, Total 9.3 8.5-10.1 mg/dL   SGOT(AST) 21 15-37 Unit/L   SGPT (ALT) 35 12-78 U/L   Alkaline Phosphatase 79 50-136 Unit/L   Albumin 4.3 3.4-5.0 g/dL   Total Protein 7.5 6.4-8.2 g/dL   Bilirubin,Total 0.5 0.2-1.0 mg/dL   Osmolality 274 275-301   Anion Gap 10 7-16   EGFR (African American) >60    EGFR (Non-African Amer.) >60   TSH  Result Value Ref Range   Thyroid Stimulating Horm 0.625 uIU/mL      Assessment & Plan:   Problem List Items Addressed This Visit      Cardiovascular and Mediastinum   Hypertension    Normal BP today. Will not restart amlodipine for risk of hypotension. Continue to monitor.         Endocrine   Thyroid disorder - Primary    Sounds like he may need dose adjustment. Will check levels today and adjust as needed. Continue to monitor.       Relevant Orders   Thyroid Panel With TSH     Other   History of sinus cancer    No longer following with ONC. Following with ENT. Continue to monitor.        Other Visit Diagnoses    Immunization due        Flu shot given today.    Relevant Orders    Flu Vaccine QUAD 36+ mos PF IM (Fluarix & Fluzone Quad PF) (Completed)    Recurrent  suppurative otitis media of both ears without spontaneous rupture of tympanic membrane, unspecified chronicity        Will treat with augmentin. Rechcek 2 weeks to confirm resolution. Continue to monitor.     Relevant Medications    amoxicillin-clavulanate (AUGMENTIN) 875-125 MG tablet        Follow up plan: Return in about 2 weeks (around 11/20/2015) for Recheck on ears.

## 2015-11-06 NOTE — Assessment & Plan Note (Signed)
Sounds like he may need dose adjustment. Will check levels today and adjust as needed. Continue to monitor.

## 2015-11-06 NOTE — Assessment & Plan Note (Signed)
Normal BP today. Will not restart amlodipine for risk of hypotension. Continue to monitor.

## 2015-11-07 ENCOUNTER — Telehealth: Payer: Self-pay | Admitting: Family Medicine

## 2015-11-07 LAB — THYROID PANEL WITH TSH
Free Thyroxine Index: 1.1 — ABNORMAL LOW (ref 1.2–4.9)
T3 Uptake Ratio: 24 % (ref 24–39)
T4, Total: 4.4 ug/dL — ABNORMAL LOW (ref 4.5–12.0)
TSH: 2 u[IU]/mL (ref 0.450–4.500)

## 2015-11-07 MED ORDER — LEVOTHYROXINE SODIUM 50 MCG PO TABS: 50.0000 ug | ORAL_TABLET | Freq: Every day | ORAL | Status: DC

## 2015-11-07 NOTE — Telephone Encounter (Signed)
Please let him know that his thyroid is looking good, so I sent a year supply to his pharmacy. Thanks!

## 2015-11-07 NOTE — Telephone Encounter (Signed)
Patient notified

## 2015-11-28 DIAGNOSIS — H6500 Acute serous otitis media, unspecified ear: Secondary | ICD-10-CM | POA: Diagnosis not present

## 2015-11-28 DIAGNOSIS — H6530 Chronic mucoid otitis media, unspecified ear: Secondary | ICD-10-CM | POA: Insufficient documentation

## 2015-11-28 DIAGNOSIS — H6531 Chronic mucoid otitis media, right ear: Secondary | ICD-10-CM | POA: Diagnosis not present

## 2015-12-25 DIAGNOSIS — H906 Mixed conductive and sensorineural hearing loss, bilateral: Secondary | ICD-10-CM | POA: Diagnosis not present

## 2015-12-30 DIAGNOSIS — H40001 Preglaucoma, unspecified, right eye: Secondary | ICD-10-CM | POA: Diagnosis not present

## 2016-01-07 DIAGNOSIS — Z461 Encounter for fitting and adjustment of hearing aid: Secondary | ICD-10-CM | POA: Insufficient documentation

## 2016-01-12 DIAGNOSIS — H40001 Preglaucoma, unspecified, right eye: Secondary | ICD-10-CM | POA: Diagnosis not present

## 2016-03-18 DIAGNOSIS — H6983 Other specified disorders of Eustachian tube, bilateral: Secondary | ICD-10-CM | POA: Diagnosis not present

## 2016-03-18 DIAGNOSIS — H663X3 Other chronic suppurative otitis media, bilateral: Secondary | ICD-10-CM | POA: Diagnosis not present

## 2016-03-18 DIAGNOSIS — H9213 Otorrhea, bilateral: Secondary | ICD-10-CM | POA: Diagnosis not present

## 2016-03-18 DIAGNOSIS — H902 Conductive hearing loss, unspecified: Secondary | ICD-10-CM | POA: Diagnosis not present

## 2016-05-20 DIAGNOSIS — H6980 Other specified disorders of Eustachian tube, unspecified ear: Secondary | ICD-10-CM | POA: Diagnosis not present

## 2016-05-20 DIAGNOSIS — H6531 Chronic mucoid otitis media, right ear: Secondary | ICD-10-CM | POA: Diagnosis not present

## 2016-05-20 DIAGNOSIS — H7201 Central perforation of tympanic membrane, right ear: Secondary | ICD-10-CM | POA: Diagnosis not present

## 2016-05-21 ENCOUNTER — Ambulatory Visit (INDEPENDENT_AMBULATORY_CARE_PROVIDER_SITE_OTHER): Payer: Medicare Other | Admitting: Family Medicine

## 2016-05-21 ENCOUNTER — Encounter: Payer: Self-pay | Admitting: Family Medicine

## 2016-05-21 ENCOUNTER — Other Ambulatory Visit: Payer: Self-pay | Admitting: Family Medicine

## 2016-05-21 VITALS — BP 159/95 | HR 72 | Temp 97.6°F | Wt 178.0 lb

## 2016-05-21 DIAGNOSIS — E785 Hyperlipidemia, unspecified: Secondary | ICD-10-CM

## 2016-05-21 DIAGNOSIS — Z125 Encounter for screening for malignant neoplasm of prostate: Secondary | ICD-10-CM

## 2016-05-21 DIAGNOSIS — I10 Essential (primary) hypertension: Secondary | ICD-10-CM

## 2016-05-21 DIAGNOSIS — Z23 Encounter for immunization: Secondary | ICD-10-CM

## 2016-05-21 DIAGNOSIS — Z13220 Encounter for screening for lipoid disorders: Secondary | ICD-10-CM

## 2016-05-21 DIAGNOSIS — K5900 Constipation, unspecified: Secondary | ICD-10-CM | POA: Diagnosis not present

## 2016-05-21 LAB — UA/M W/RFLX CULTURE, ROUTINE
Bilirubin, UA: NEGATIVE
Glucose, UA: NEGATIVE
Ketones, UA: NEGATIVE
Leukocytes, UA: NEGATIVE
Nitrite, UA: NEGATIVE
Protein, UA: NEGATIVE
RBC, UA: NEGATIVE
Specific Gravity, UA: 1.01 (ref 1.005–1.030)
Urobilinogen, Ur: 0.2 mg/dL (ref 0.2–1.0)
pH, UA: 7 (ref 5.0–7.5)

## 2016-05-21 LAB — MICROALBUMIN, URINE WAIVED
Creatinine, Urine Waived: 10 mg/dL (ref 10–300)
Microalb, Ur Waived: 10 mg/L (ref 0–19)
Microalb/Creat Ratio: <30 mg/g (ref <30)

## 2016-05-21 LAB — LIPID PANEL PICCOLO, WAIVED
Chol/HDL Ratio Piccolo,Waive: 5.4 mg/dL — ABNORMAL HIGH
Cholesterol Piccolo, Waived: 251 mg/dL — ABNORMAL HIGH (ref <200)
HDL Chol Piccolo, Waived: 47 mg/dL — ABNORMAL LOW (ref >59)
LDL Chol Calc Piccolo Waived: 146 mg/dL — ABNORMAL HIGH (ref <100)
Triglycerides Piccolo,Waived: 292 mg/dL — ABNORMAL HIGH (ref <150)
VLDL Chol Calc Piccolo,Waive: 58 mg/dL — ABNORMAL HIGH (ref <30)

## 2016-05-21 MED ORDER — POLYETHYLENE GLYCOL 3350 17 GM/SCOOP PO POWD: ORAL | 1 refills | Status: DC

## 2016-05-21 MED ORDER — AMLODIPINE BESYLATE 2.5 MG PO TABS: 2.5000 mg | ORAL_TABLET | Freq: Every day | ORAL | 2 refills | Status: DC

## 2016-05-21 NOTE — Assessment & Plan Note (Signed)
Will work on diet and exercise and recheck at physical.

## 2016-05-21 NOTE — Assessment & Plan Note (Signed)
Not under good control. Will restart his amlodipine at a lower dose and recheck in 1 month.

## 2016-05-21 NOTE — Patient Instructions (Addendum)
Tdap Vaccine (Tetanus, Diphtheria and Pertussis): What You Need to Know 1. Why get vaccinated? Tetanus, diphtheria and pertussis are very serious diseases. Tdap vaccine can protect us from these diseases. And, Tdap vaccine given to pregnant women can protect newborn babies against pertussis. TETANUS (Lockjaw) is rare in the [place] today. It causes painful muscle tightening and stiffness, usually all over the body.  It can lead to tightening of muscles in the head and neck so you can't open your mouth, swallow, or sometimes even breathe. Tetanus kills about 1 out of 10 people who are infected even after receiving the best medical care. DIPHTHERIA is also rare in the [place] today. It can cause a thick coating to form in the back of the throat.  It can lead to breathing problems, heart failure, paralysis, and death. PERTUSSIS (Whooping Cough) causes severe coughing spells, which can cause difficulty breathing, vomiting and disturbed sleep.  It can also lead to weight loss, incontinence, and rib fractures. Up to 2 in 100 adolescents and 5 in 100 adults with pertussis are hospitalized or have complications, which could include pneumonia or death. These diseases are caused by bacteria. Diphtheria and pertussis are spread from person to person through secretions from coughing or sneezing. Tetanus enters the body through cuts, scratches, or wounds. Before vaccines, as many as 200,000 cases of diphtheria, 200,000 cases of pertussis, and hundreds of cases of tetanus, were reported in the [place] each year. Since vaccination began, reports of cases for tetanus and diphtheria have dropped by about 99% and for pertussis by about 80%. 2. Tdap vaccine Tdap vaccine can protect adolescents and adults from tetanus, diphtheria, and pertussis. One dose of Tdap is routinely given at age 11 or 12. People who did not get Tdap at that age should get it as soon as possible. Tdap is especially important  for healthcare professionals and anyone having close contact with a baby younger than 12 months. Pregnant women should get a dose of Tdap during every pregnancy, to protect the newborn from pertussis. Infants are most at risk for severe, life-threatening complications from pertussis. Another vaccine, called Td, protects against tetanus and diphtheria, but not pertussis. A Td booster should be given every 10 years. Tdap may be given as one of these boosters if you have never gotten Tdap before. Tdap may also be given after a severe cut or burn to prevent tetanus infection. Your doctor or the person giving you the vaccine can give you more information. Tdap may safely be given at the same time as other vaccines. 3. Some people should not get this vaccine  A person who has ever had a life-threatening allergic reaction after a previous dose of any diphtheria, tetanus or pertussis containing vaccine, OR has a severe allergy to any part of this vaccine, should not get Tdap vaccine. Tell the person giving the vaccine about any severe allergies.  Anyone who had coma or long repeated seizures within 7 days after a childhood dose of DTP or DTaP, or a previous dose of Tdap, should not get Tdap, unless a cause other than the vaccine was found. They can still get Td.  Talk to your doctor if you:  have seizures or another nervous system problem,  had severe pain or swelling after any vaccine containing diphtheria, tetanus or pertussis,  ever had a condition called Guillain-Barr Syndrome (GBS),  aren't feeling well on the day the shot is scheduled. 4. Risks With any medicine, including vaccines, there is   a chance of side effects. These are usually mild and go away on their own. Serious reactions are also possible but are rare. Most people who get Tdap vaccine do not have any problems with it. Mild problems following Tdap (Did not interfere with activities)  Pain where the shot was given (about 3 in 4  adolescents or 2 in 3 adults)  Redness or swelling where the shot was given (about 1 person in 5)  Mild fever of at least 100.4F (up to about 1 in 25 adolescents or 1 in 100 adults)  Headache (about 3 or 4 people in 10)  Tiredness (about 1 person in 3 or 4)  Nausea, vomiting, diarrhea, stomach ache (up to 1 in 4 adolescents or 1 in 10 adults)  Chills, sore joints (about 1 person in 10)  Body aches (about 1 person in 3 or 4)  Rash, swollen glands (uncommon) Moderate problems following Tdap (Interfered with activities, but did not require medical attention)  Pain where the shot was given (up to 1 in 5 or 6)  Redness or swelling where the shot was given (up to about 1 in 16 adolescents or 1 in 12 adults)  Fever over 102F (about 1 in 100 adolescents or 1 in 250 adults)  Headache (about 1 in 7 adolescents or 1 in 10 adults)  Nausea, vomiting, diarrhea, stomach ache (up to 1 or 3 people in 100)  Swelling of the entire arm where the shot was given (up to about 1 in 500). Severe problems following Tdap (Unable to perform usual activities; required medical attention)  Swelling, severe pain, bleeding and redness in the arm where the shot was given (rare). Problems that could happen after any vaccine:  People sometimes faint after a medical procedure, including vaccination. Sitting or lying down for about 15 minutes can help prevent fainting, and injuries caused by a fall. Tell your doctor if you feel dizzy, or have vision changes or ringing in the ears.  Some people get severe pain in the shoulder and have difficulty moving the arm where a shot was given. This happens very rarely.  Any medication can cause a severe allergic reaction. Such reactions from a vaccine are very rare, estimated at fewer than 1 in a million doses, and would happen within a few minutes to a few hours after the vaccination. As with any medicine, there is a very remote chance of a vaccine causing a serious  injury or death. The safety of vaccines is always being monitored. For more information, visit: www.cdc.gov/vaccinesafety/ 5. What if there is a serious problem? What should I look for?  Look for anything that concerns you, such as signs of a severe allergic reaction, very high fever, or unusual behavior.  Signs of a severe allergic reaction can include hives, swelling of the face and throat, difficulty breathing, a fast heartbeat, dizziness, and weakness. These would usually start a few minutes to a few hours after the vaccination. What should I do?  If you think it is a severe allergic reaction or other emergency that can't wait, call 9-1-1 or get the person to the nearest hospital. Otherwise, call your doctor.  Afterward, the reaction should be reported to the Vaccine Adverse Event Reporting System (VAERS). Your doctor might file this report, or you can do it yourself through the VAERS web site at www.vaers.hhs.gov, or by calling 1-800-822-7967. VAERS does not give medical advice.  6. The National Vaccine Injury Compensation Program The National Vaccine Injury Compensation Program (  VICP) is a federal program that was created to compensate people who may have been injured by certain vaccines. Persons who believe they may have been injured by a vaccine can learn about the program and about filing a claim by calling 980-853-3171 or visiting the [employer] website at GoldCloset.com.ee. There is a time limit to file a claim for compensation. 7. How can I learn more?  Ask your doctor. He or she can give you the vaccine package insert or suggest other sources of information.  Call your local or state health department.  Contact the Centers for Disease Control and Prevention (CDC):  Call 807-711-6081 (1-800-CDC-INFO) or  Visit CDC's website at http://hunter.com/ CDC Tdap Vaccine VIS (11/20/13)   This information is not intended to replace advice given to you by your health care  provider. Make sure you discuss any questions you have with your health care provider.   Document Released: 03/14/2012 Document Revised: 10/04/2014 Document Reviewed: 12/26/2013 Elsevier Interactive Patient Education 2016 [employer].  Constipation, Adult Constipation is when a person:  Poops (has a bowel movement) less than 3 times a week.  Has a hard time pooping.  Has poop that is dry, hard, or bigger than normal. HOME CARE   Eat foods with a lot of fiber in them. This includes fruits, vegetables, beans, and whole grains such as brown rice.  Avoid fatty foods and foods with a lot of sugar. This includes french fries, hamburgers, cookies, candy, and soda.  If you are not getting enough fiber from food, take products with added fiber in them (supplements).  Drink enough fluid to keep your pee (urine) clear or pale yellow.  Exercise on a regular basis, or as told by your doctor.  Go to the restroom when you feel like you need to poop. Do not hold it.  Only take medicine as told by your doctor. Do not take medicines that help you poop (laxatives) without talking to your doctor first. GET HELP RIGHT AWAY IF:   You have bright red blood in your poop (stool).  Your constipation lasts more than 4 days or gets worse.  You have belly (abdominal) or butt (rectal) pain.  You have thin poop (as thin as a pencil).  You lose weight, and it cannot be explained. MAKE SURE YOU:   Understand these instructions.  Will watch your condition.  Will get help right away if you are not doing well or get worse.   This information is not intended to replace advice given to you by your health care provider. Make sure you discuss any questions you have with your health care provider.   Document Released: 03/01/2008 Document Revised: 10/04/2014 Document Reviewed: 06/25/2013 Elsevier Interactive Patient Education [employer].

## 2016-05-21 NOTE — Progress Notes (Signed)
BP (!) 159/95 (BP Location: Left Arm, Patient Position: Sitting, Cuff Size: Normal)   Pulse 72   Temp 97.6 F (36.4 C)   Wt 178 lb (80.7 kg)   SpO2 100%   BMI 23.23 kg/m    Subjective:    Patient ID: Marcus Wright, male    DOB: [birth year], 61 y.o.   MRN: [id number]  HPI: Marcus Wright is a 60 y.o. male  Chief Complaint  Patient presents with  . Constipation   Has been constipated for about 6 months. Has only been going once every 2-3 weeks. Has been eating very little. Notes that he has only little rabbit pellets. Will take phillips and will have very large bowel movements. Will work well for a couple of days and then will not got again. Has not had any cancer in his abdomen- history relatively unclear.  Nature: bloating Location: groin  Severity: mild  Radiation: no Episode duration: until he has a bowel movement Frequency: intermittent Alleviating factors: better with bowel movement Aggravating factors: worse without a bowel movement Treatments attempted: phillips 2x a month Constipation: yes Diarrhea: no Mucous in the stool: no Heartburn: yes Bloating:yes Flatulence: yes Nausea: no Vomiting: no Melena or hematochezia: no Rash: no Jaundice: no Fever: no Weight loss: no  HYPERTENSION Hypertension status: uncontrolled  Satisfied with current treatment? yes Duration of hypertension: chronic BP monitoring frequency:  not checking BP medication side effects:  no Medication compliance: Was taken off meds 6 months ago.  Previous BP meds: amlodipine Aspirin: no Recurrent headaches: no Visual changes: no Palpitations: no Dyspnea: no Chest pain: no Lower extremity edema: no Dizzy/lightheaded: no  Relevant past medical, surgical, family and social history reviewed and updated as indicated. Interim medical history since our last visit reviewed. Allergies and medications reviewed and updated.  Review of Systems  Constitutional: Negative.     Respiratory: Negative.   Cardiovascular: Negative.   Gastrointestinal: Positive for abdominal distention, abdominal pain and constipation. Negative for anal bleeding, blood in stool, diarrhea, nausea, rectal pain and vomiting.  Psychiatric/Behavioral: Negative.     Per HPI unless specifically indicated above     Objective:    BP (!) 159/95 (BP Location: Left Arm, Patient Position: Sitting, Cuff Size: Normal)   Pulse 72   Temp 97.6 F (36.4 C)   Wt 178 lb (80.7 kg)   SpO2 100%   BMI 23.23 kg/m   Wt Readings from Last 3 Encounters:  05/21/16 178 lb (80.7 kg)  11/06/15 176 lb (79.8 kg)  12/17/13 197 lb (89.4 kg)    Physical Exam  Constitutional: He is oriented to person, place, and time. He appears well-developed and well-nourished. No distress.  HENT:  Head: Normocephalic and atraumatic.  Right Ear: Hearing normal.  Left Ear: Hearing normal.  Nose: Nose normal.  Eyes: Conjunctivae and lids are normal. Right eye exhibits no discharge. Left eye exhibits no discharge. No scleral icterus.  Cardiovascular: Normal rate, regular rhythm, normal heart sounds and intact distal pulses.  Exam reveals no gallop and no friction rub.   No murmur heard. Pulmonary/Chest: Effort normal and breath sounds normal. No respiratory distress. He has no wheezes. He has no rales. He exhibits no tenderness.  Abdominal: Soft. Bowel sounds are normal. He exhibits no distension and no mass. There is no tenderness. There is no rebound and no guarding.  Musculoskeletal: Normal range of motion.  Neurological: He is alert and oriented to person, place, and time.  Skin: Skin is warm, dry  and intact. No rash noted. He is not diaphoretic. No erythema. No pallor.  Psychiatric: He has a normal mood and affect. His speech is normal and behavior is normal. Judgment and thought content normal. Cognition and memory are normal.  Nursing note and vitals reviewed.   Results for orders placed or performed in visit on  11/06/15  Thyroid Panel With TSH  Result Value Ref Range   TSH 2.000 0.450 - 4.500 uIU/mL   T4, Total 4.4 (L) 4.5 - 12.0 ug/dL   T3 Uptake Ratio 24 24 - 39 %   Free Thyroxine Index 1.1 (L) 1.2 - 4.9      Assessment & Plan:   Problem List Items Addressed This Visit      Cardiovascular and Mediastinum   Hypertension - Primary    Not under good control. Will restart his amlodipine at a lower dose and recheck in 1 month.       Relevant Medications   amLODipine (NORVASC) 2.5 MG tablet     Other   Hyperlipidemia    Will work on diet and exercise and recheck at physical.      Relevant Medications   amLODipine (NORVASC) 2.5 MG tablet    Other Visit Diagnoses    Screening for cholesterol level       Checking labs today. Await results.    Screening for prostate cancer       Checking labs today. Await results.    Need for diphtheria-tetanus-pertussis (Tdap) vaccine       Tdap given today.   Relevant Orders   Tdap vaccine greater than or equal to 7yo IM (Completed)   Immunization due       Rx for shingles vaccine given today to get at his pharmacy.   Constipation, unspecified constipation type       Will check labs. Miralax nightly. Will get him into GI. Increase fluids and fiber.    Relevant Orders   TSH   Ambulatory referral to Gastroenterology       Follow up plan: Return in about 4 weeks (around 06/18/2016) for BP and constipation.

## 2016-05-22 LAB — COMPREHENSIVE METABOLIC PANEL
ALT: 15 IU/L (ref 0–44)
AST: 21 IU/L (ref 0–40)
Albumin/Globulin Ratio: 2.1 (ref 1.2–2.2)
Albumin: 4.4 g/dL (ref 3.6–4.8)
Alkaline Phosphatase: 68 IU/L (ref 39–117)
BUN/Creatinine Ratio: 7 — ABNORMAL LOW (ref 10–24)
BUN: 6 mg/dL — ABNORMAL LOW (ref 8–27)
Bilirubin Total: 0.5 mg/dL (ref 0.0–1.2)
CO2: 26 mmol/L (ref 18–29)
Calcium: 9.4 mg/dL (ref 8.6–10.2)
Chloride: 98 mmol/L (ref 96–106)
Creatinine, Ser: 0.91 mg/dL (ref 0.76–1.27)
GFR calc Af Amer: 105 mL/min/{1.73_m2} (ref >59)
GFR calc non Af Amer: 91 mL/min/{1.73_m2} (ref >59)
Globulin, Total: 2.1 g/dL (ref 1.5–4.5)
Glucose: 84 mg/dL (ref 65–99)
Potassium: 4.4 mmol/L (ref 3.5–5.2)
Sodium: 140 mmol/L (ref 134–144)
Total Protein: 6.5 g/dL (ref 6.0–8.5)

## 2016-05-22 LAB — CBC WITH DIFFERENTIAL/PLATELET
Basophils Absolute: 0.1 10*3/uL (ref 0.0–0.2)
Basos: 1 %
EOS (ABSOLUTE): 0.3 10*3/uL (ref 0.0–0.4)
Eos: 6 %
Hematocrit: 40.1 % (ref 37.5–51.0)
Hemoglobin: 13.4 g/dL (ref 12.6–17.7)
Immature Grans (Abs): 0 10*3/uL (ref 0.0–0.1)
Immature Granulocytes: 0 %
Lymphocytes Absolute: 2.2 10*3/uL (ref 0.7–3.1)
Lymphs: 42 %
MCH: 28.1 pg (ref 26.6–33.0)
MCHC: 33.4 g/dL (ref 31.5–35.7)
MCV: 84 fL (ref 79–97)
Monocytes Absolute: 0.5 10*3/uL (ref 0.1–0.9)
Monocytes: 10 %
Neutrophils Absolute: 2.1 10*3/uL (ref 1.4–7.0)
Neutrophils: 41 %
Platelets: 254 10*3/uL (ref 150–379)
RBC: 4.77 x10E6/uL (ref 4.14–5.80)
RDW: 14 % (ref 12.3–15.4)
WBC: 5.2 10*3/uL (ref 3.4–10.8)

## 2016-05-22 LAB — PSA: Prostate Specific Ag, Serum: 0.5 ng/mL (ref 0.0–4.0)

## 2016-05-22 LAB — TSH: TSH: 1.58 u[IU]/mL (ref 0.450–4.500)

## 2016-05-24 ENCOUNTER — Encounter: Payer: Self-pay | Admitting: Family Medicine

## 2016-06-21 ENCOUNTER — Encounter: Payer: Self-pay | Admitting: Family Medicine

## 2016-06-21 ENCOUNTER — Ambulatory Visit (INDEPENDENT_AMBULATORY_CARE_PROVIDER_SITE_OTHER): Payer: Medicare Other | Admitting: Family Medicine

## 2016-06-21 VITALS — BP 139/82 | HR 65 | Temp 97.6°F | Wt 175.0 lb

## 2016-06-21 DIAGNOSIS — K5900 Constipation, unspecified: Secondary | ICD-10-CM | POA: Diagnosis not present

## 2016-06-21 DIAGNOSIS — I10 Essential (primary) hypertension: Secondary | ICD-10-CM | POA: Diagnosis not present

## 2016-06-21 DIAGNOSIS — Z23 Encounter for immunization: Secondary | ICD-10-CM

## 2016-06-21 MED ORDER — AMLODIPINE BESYLATE 2.5 MG PO TABS: 2.5000 mg | ORAL_TABLET | Freq: Every day | ORAL | 1 refills | Status: DC

## 2016-06-21 NOTE — Assessment & Plan Note (Signed)
Under good control. Continue current regimen. Continue to monitor. Call with any concerns. 

## 2016-06-21 NOTE — Progress Notes (Signed)
BP 139/82 (BP Location: Left Arm, Patient Position: Sitting, Cuff Size: Normal)   Pulse 65   Temp 97.6 F (36.4 C)   Wt 175 lb (79.4 kg)   SpO2 99%   BMI 22.84 kg/m    Subjective:    Patient ID: Marcus Wright, male    DOB: December 20, 1955, 60 y.o.   MRN: [id number]  HPI: Marcus Wright is a 60 y.o. male  Chief Complaint  Patient presents with  . Hypertension  . Constipation   HYPERTENSION Hypertension status: controlled  Satisfied with current treatment? yes Duration of hypertension: chronic BP monitoring frequency:  not checking BP medication side effects:  no Medication compliance: excellent compliance Previous BP meds: amlodipine Aspirin: no Recurrent headaches: no Visual changes: no Palpitations: no Dyspnea: no Chest pain: no Lower extremity edema: no Dizzy/lightheaded: no  Constipation- resolved with taking his vitamins again. Feeling well.   Relevant past medical, surgical, family and social history reviewed and updated as indicated. Interim medical history since our last visit reviewed. Allergies and medications reviewed and updated.  Review of Systems  Constitutional: Negative.   Respiratory: Negative.   Cardiovascular: Negative.   Psychiatric/Behavioral: Negative.     Per HPI unless specifically indicated above     Objective:    BP 139/82 (BP Location: Left Arm, Patient Position: Sitting, Cuff Size: Normal)   Pulse 65   Temp 97.6 F (36.4 C)   Wt 175 lb (79.4 kg)   SpO2 99%   BMI 22.84 kg/m   Wt Readings from Last 3 Encounters:  06/21/16 175 lb (79.4 kg)  05/21/16 178 lb (80.7 kg)  11/06/15 176 lb (79.8 kg)    Physical Exam  Constitutional: He is oriented to person, place, and time. He appears well-developed and well-nourished. No distress.  HENT:  Head: Normocephalic and atraumatic.  Right Ear: Hearing normal.  Left Ear: Hearing normal.  Nose: Nose normal.  Eyes: Conjunctivae and lids are normal. Right eye exhibits no  discharge. Left eye exhibits no discharge. No scleral icterus.  Cardiovascular: Normal rate, regular rhythm, normal heart sounds and intact distal pulses.  Exam reveals no gallop and no friction rub.   No murmur heard. Pulmonary/Chest: Effort normal and breath sounds normal. No respiratory distress. He has no wheezes. He has no rales. He exhibits no tenderness.  Musculoskeletal: Normal range of motion.  Neurological: He is alert and oriented to person, place, and time.  Skin: Skin is warm, dry and intact. No rash noted. He is not diaphoretic. No erythema. No pallor.  Psychiatric: He has a normal mood and affect. His speech is normal and behavior is normal. Judgment and thought content normal. Cognition and memory are normal.  Nursing note and vitals reviewed.   Results for orders placed or performed in visit on 05/21/16  CBC with Differential/Platelet  Result Value Ref Range   WBC 5.2 3.4 - 10.8 x10E3/uL   RBC 4.77 4.14 - 5.80 x10E6/uL   Hemoglobin 13.4 12.6 - 17.7 g/dL   Hematocrit 40.1 37.5 - 51.0 %   MCV 84 79 - 97 fL   MCH 28.1 26.6 - 33.0 pg   MCHC 33.4 31.5 - 35.7 g/dL   RDW 14.0 12.3 - 15.4 %   Platelets 254 150 - 379 x10E3/uL   Neutrophils 41 %   Lymphs 42 %   Monocytes 10 %   Eos 6 %   Basos 1 %   Neutrophils Absolute 2.1 1.4 - 7.0 x10E3/uL   Lymphocytes Absolute 2.2  0.7 - 3.1 x10E3/uL   Monocytes Absolute 0.5 0.1 - 0.9 x10E3/uL   EOS (ABSOLUTE) 0.3 0.0 - 0.4 x10E3/uL   Basophils Absolute 0.1 0.0 - 0.2 x10E3/uL   Immature Granulocytes 0 %   Immature Grans (Abs) 0.0 0.0 - 0.1 x10E3/uL  Comprehensive metabolic panel  Result Value Ref Range   Glucose 84 65 - 99 mg/dL   BUN 6 (L) 8 - 27 mg/dL   Creatinine, Ser 0.91 0.76 - 1.27 mg/dL   GFR calc non Af Amer 91 >59 mL/min/1.73   GFR calc Af Amer 105 >59 mL/min/1.73   BUN/Creatinine Ratio 7 (L) 10 - 24   Sodium 140 134 - 144 mmol/L   Potassium 4.4 3.5 - 5.2 mmol/L   Chloride 98 96 - 106 mmol/L   CO2 26 18 - 29 mmol/L    Calcium 9.4 8.6 - 10.2 mg/dL   Total Protein 6.5 6.0 - 8.5 g/dL   Albumin 4.4 3.6 - 4.8 g/dL   Globulin, Total 2.1 1.5 - 4.5 g/dL   Albumin/Globulin Ratio 2.1 1.2 - 2.2   Bilirubin Total 0.5 0.0 - 1.2 mg/dL   Alkaline Phosphatase 68 39 - 117 IU/L   AST 21 0 - 40 IU/L   ALT 15 0 - 44 IU/L  Lipid Panel Piccolo, Waived  Result Value Ref Range   Cholesterol Piccolo, Waived 251 (H) <200 mg/dL   HDL Chol Piccolo, Waived 47 (L) >59 mg/dL   Triglycerides Piccolo,Waived 292 (H) <150 mg/dL   Chol/HDL Ratio Piccolo,Waive 5.4 (H) mg/dL   LDL Chol Calc Piccolo Waived 146 (H) <100 mg/dL   VLDL Chol Calc Piccolo,Waive 58 (H) <30 mg/dL  Microalbumin, Urine Waived  Result Value Ref Range   Microalb, Ur Waived 10 0 - 19 mg/L   Creatinine, Urine Waived 10 10 - 300 mg/dL   Microalb/Creat Ratio <30 <30 mg/g  UA/M w/rflx Culture, Routine  Result Value Ref Range   Specific Gravity, UA 1.010 1.005 - 1.030   pH, UA 7.0 5.0 - 7.5   Color, UA Yellow Yellow   Appearance Ur Clear Clear   Leukocytes, UA Negative Negative   Protein, UA Negative Negative/Trace   Glucose, UA Negative Negative   Ketones, UA Negative Negative   RBC, UA Negative Negative   Bilirubin, UA Negative Negative   Urobilinogen, Ur 0.2 0.2 - 1.0 mg/dL   Nitrite, UA Negative Negative  PSA  Result Value Ref Range   Prostate Specific Ag, Serum 0.5 0.0 - 4.0 ng/mL  TSH  Result Value Ref Range   TSH 1.580 0.450 - 4.500 uIU/mL      Assessment & Plan:   Problem List Items Addressed This Visit      Cardiovascular and Mediastinum   Hypertension - Primary    Under good control. Continue current regimen. Continue to monitor. Call with any concerns.       Relevant Medications   amLODipine (NORVASC) 2.5 MG tablet    Other Visit Diagnoses    Constipation, unspecified constipation type       Improved. Regular again. Continue to monitor. Call with any concerns.    Immunization due       Flu shot given today.   Relevant Orders   Flu  Vaccine QUAD 36+ mos PF IM (Fluarix & Fluzone Quad PF) (Completed)       Follow up plan: Return in about 6 months (around 12/19/2016) for Physical.

## 2016-06-21 NOTE — Patient Instructions (Addendum)

## 2016-06-22 ENCOUNTER — Emergency Department
Admission: EM | Admit: 2016-06-22 | Discharge: 2016-06-22 | Disposition: A | Discharge status: Home / Self Care | Payer: Medicare Other | Attending: Emergency Medicine | Admitting: Emergency Medicine

## 2016-06-22 ENCOUNTER — Telehealth: Payer: Self-pay | Admitting: Family Medicine

## 2016-06-22 ENCOUNTER — Encounter: Payer: Self-pay | Admitting: Emergency Medicine

## 2016-06-22 DIAGNOSIS — I10 Essential (primary) hypertension: Secondary | ICD-10-CM | POA: Diagnosis not present

## 2016-06-22 DIAGNOSIS — Z85828 Personal history of other malignant neoplasm of skin: Secondary | ICD-10-CM | POA: Insufficient documentation

## 2016-06-22 DIAGNOSIS — R1110 Vomiting, unspecified: Secondary | ICD-10-CM | POA: Diagnosis not present

## 2016-06-22 DIAGNOSIS — Z79899 Other long term (current) drug therapy: Secondary | ICD-10-CM | POA: Insufficient documentation

## 2016-06-22 DIAGNOSIS — Z8522 Personal history of malignant neoplasm of nasal cavities, middle ear, and accessory sinuses: Secondary | ICD-10-CM | POA: Diagnosis not present

## 2016-06-22 DIAGNOSIS — R519 Headache, unspecified: Secondary | ICD-10-CM

## 2016-06-22 DIAGNOSIS — R51 Headache: Secondary | ICD-10-CM | POA: Diagnosis not present

## 2016-06-22 MED ORDER — GABAPENTIN 300 MG PO CAPS: ORAL_CAPSULE | ORAL | 0 refills | Status: DC

## 2016-06-22 MED ORDER — GABAPENTIN 300 MG PO CAPS
300.0000 mg | ORAL_CAPSULE | Freq: Once | ORAL | Status: AC
  Administered 2016-06-22: 300 mg via ORAL

## 2016-06-22 MED ORDER — GABAPENTIN 300 MG PO CAPS
ORAL_CAPSULE | ORAL | Status: AC
  Filled 2016-06-22: qty 1

## 2016-06-22 NOTE — ED Notes (Signed)
Pt. 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 Pt. Reports throbbing and loud sound echoing the past 2 weeks, worse the past 2 days. Pt. States vomiting the past 2 nights.

## 2016-06-22 NOTE — ED Triage Notes (Signed)
Brought in via family with headache    describes pain as throbbing   Vomited last pm denies any trauma   Or fever.Marland Kitchen

## 2016-06-22 NOTE — Telephone Encounter (Signed)
Pt would like a to have gabapentin 300mg  capsule sent to [facility].

## 2016-06-22 NOTE — Telephone Encounter (Signed)
I'm not sure what he's talking about and he has not been on that medicine that I can see.

## 2016-06-22 NOTE — ED Provider Notes (Addendum)
[facility] Emergency Department Provider Note   ____________________________________________   First MD Initiated Contact with Patient 06/22/16 1948     (approximate)  I have reviewed the triage vital signs and the nursing notes.   HISTORY  Chief Complaint Headache   HPI Marcus Wright is a 60 y.o. 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 He says that he ran out of his gabapentin about a week ago which helps control his pain he says the pain is been slowly increasing and is to the bilateral parts of his head and feels like a swelling pressure. He also says that the headaches have been associated with vomiting and he vomited the last time yesterday. He says previously when he has had these headaches that he has also vomited. He denies any dizziness. Also says that he has tenderness bilaterally and wears bilateral hearing aids. Denies any ear pain.   Past Medical History:  Diagnosis Date  . Cancer [facility]) 2003   skin'  . Hypertension   . Thyroid disorder     Patient Active Problem List   Diagnosis Date Noted  . Hyperlipidemia 05/21/2016  . History of sinus cancer   . Hypertension   . Thyroid disorder   . Gynecomastia, male 06/20/2013    Past Surgical History:  Procedure Laterality Date  . BACK SURGERY  2011  . BREAST SURGERY Right 11-22-12  . CHOLECYSTECTOMY    . COLONOSCOPY    . REPLACEMENT TOTAL KNEE Right 1993  . SINUS SURGERY WITH INSTATRAK  2003   cancer    Prior to Admission medications   Medication Sig Start Date End Date Taking? Authorizing Provider  amLODipine (NORVASC) 2.5 MG tablet Take 1 tablet (2.5 mg total) by mouth daily. 06/21/16   Megan P Johnson, DO  levothyroxine (SYNTHROID, LEVOTHROID) 50 MCG tablet Take 1 tablet (50 mcg total) by mouth daily. 11/07/15   Megan P Johnson, DO  OVER  THE COUNTER MEDICATION Vitamins    Historical Provider, MD    Allergies Doxazosin  No family history on file.  Social History Social History  Substance Use Topics  . Smoking status: Never Smoker  . Smokeless tobacco: Never Used  . Alcohol use Yes     Comment: on occasion    Review of Systems Constitutional: No fever/chills Eyes: No visual changes. ENT: No sore throat. Cardiovascular: Denies chest pain. Respiratory: Denies shortness of breath. Gastrointestinal: No abdominal pain.  No diarrhea.  No constipation. Genitourinary: Negative for dysuria. Musculoskeletal: Negative for back pain. Skin: Negative for rash. Neurological: Negative for focal weakness or numbness.  10-point ROS otherwise negative.  ____________________________________________   PHYSICAL EXAM:  VITAL SIGNS: ED Triage Vitals  Enc Vitals Group     BP 06/22/16 1842 133/79     Pulse Rate 06/22/16 1842 71     Resp 06/22/16 1842 20     Temp 06/22/16 1842 97.8 F (36.6 C)     Temp Source 06/22/16 1842 Oral     SpO2 06/22/16 1842 99 %     Weight 06/22/16 1842 175 lb (79.4 kg)     Height 06/22/16 1842 6\' 2"  (1.88 m)     Head Circumference --      Peak Flow --      Pain Score 06/22/16 1841 10     Pain Loc --  Pain Edu? --      Excl. in [place]? --     Constitutional: Alert and oriented. Well appearing and in no acute distress. Eyes: Conjunctivae are normal. PERRL. EOMI. Head: Atraumatic.Bilateral scarred TMs but without any bulging or erythema. Nose: No congestion/rhinnorhea. Mouth/Throat: Mucous membranes are moist.   Neck: No stridor.   Cardiovascular: Normal rate, regular rhythm. Grossly normal heart sounds.   Respiratory: Normal respiratory effort.  No retractions. Lungs CTAB. Gastrointestinal: Soft and nontender. No distention.  Musculoskeletal: No lower extremity tenderness nor edema.  No joint effusions. Neurologic:  Normal speech and language. No gross focal neurologic deficits are  appreciated. No gait instability. Skin:  Skin is warm, dry and intact. No rash noted. Psychiatric: Mood and affect are normal. Speech and behavior are normal.  ____________________________________________   LABS (all labs ordered are listed, but only abnormal results are displayed)  Labs Reviewed - No data to display ____________________________________________  EKG   ____________________________________________  RADIOLOGY   ____________________________________________   PROCEDURES  Procedure(s) performed:   Procedures  Critical Care performed:   ____________________________________________   INITIAL IMPRESSION / ASSESSMENT AND PLAN / ED COURSE  Pertinent labs & imaging results that were available during my care of the patient were reviewed by me and considered in my medical decision making (see chart for details). ----------------------------------------- 9:40 PM on 06/22/2016 -----------------------------------------   Patient says that the pain on the right side of his head is completely gone. He says that the pain a left assault 6 out of 10 but is improving. I will be discharging him at this time as he says that he is ready to go home. I will refill his prescription for gabapentin and he will follow-up with his primary care doctor for further evaluation. Excellent this plan to the patient as well as his wife was at bedside and they're understanding willing to comply. Likely nerve irritation from remote issue of radiation damage which the patient has experienced multiple times in the past.  Clinical Course     ____________________________________________   FINAL CLINICAL IMPRESSION(S) / ED DIAGNOSES  Headache.    NEW MEDICATIONS STARTED DURING THIS VISIT:  New Prescriptions   No medications on file     Note:  This document was prepared using Dragon voice recognition software and may include unintentional dictation errors.    Orbie Pyo, MD 06/22/16 2141  Gabapentin instructions copied from previous prescription.    Orbie Pyo, MD 06/22/16 [phone]

## 2016-06-22 NOTE — Telephone Encounter (Signed)
Called and left a message for patient to return my call.  

## 2016-06-22 NOTE — Telephone Encounter (Signed)
Dr.Johnson, is this something that was discussed at the visit yesterday?

## 2016-06-24 NOTE — Telephone Encounter (Signed)
Unable to get in touch with patient will see if patient calls back.

## 2016-06-28 ENCOUNTER — Encounter: Payer: Self-pay | Admitting: Gastroenterology

## 2016-06-28 ENCOUNTER — Ambulatory Visit (INDEPENDENT_AMBULATORY_CARE_PROVIDER_SITE_OTHER): Payer: Medicare Other | Admitting: Gastroenterology

## 2016-06-28 ENCOUNTER — Other Ambulatory Visit: Payer: Self-pay

## 2016-06-28 VITALS — BP 103/72 | HR 65 | Temp 97.8°F | Ht 73.0 in | Wt 180.0 lb

## 2016-06-28 DIAGNOSIS — K5909 Other constipation: Secondary | ICD-10-CM

## 2016-06-28 DIAGNOSIS — K5900 Constipation, unspecified: Secondary | ICD-10-CM | POA: Diagnosis not present

## 2016-06-28 MED ORDER — PEG 3350-KCL-NA BICARB-NACL 420 G PO SOLR: 4000.0000 mL | ORAL | 0 refills | Status: DC

## 2016-06-28 NOTE — Progress Notes (Addendum)
Gastroenterology Consultation  Referring Provider:     Valerie Roys, DO Primary Care Physician:  Park Liter, DO Primary Gastroenterologist:  Dr. Allen Norris     Reason for Consultation:     Constipation        HPI:   Marcus Wright is a 60 y.o. y/o male referred for consultation & management of Constipation by Dr. Park Liter, DO.  This patient comes today for evaluation due to constipation. The patient states that he has had severe constipation since having treatment for his sinus cancer. The patient denies any nausea or vomiting. He also denies any black stools or bloody stools. The patient states he had a colonoscopy many years ago but does not know how long ago was. He is significantly hard of hearing. The patient comes with his wife who gives most of his medical history. There is no report of any personal history of colon cancer colon polyps. He believes that a lot of the symptoms are related to his treatment for his sinus cancer.  Past Medical History:  Diagnosis Date  . Cancer [facility]) 2003   skin'  . Hypertension   . Thyroid disorder     Past Surgical History:  Procedure Laterality Date  . BACK SURGERY  2011  . BREAST SURGERY Right 11-22-12  . CHOLECYSTECTOMY    . COLONOSCOPY    . REPLACEMENT TOTAL KNEE Right 1993  . SINUS SURGERY WITH INSTATRAK  2003   cancer    Prior to Admission medications   Medication Sig Start Date End Date Taking? Authorizing Provider  amLODipine (NORVASC) 2.5 MG tablet Take 1 tablet (2.5 mg total) by mouth daily. 06/21/16  Yes Megan P Johnson, DO  gabapentin (NEURONTIN) 300 MG capsule Take 1 capsule at bedtime for 4 days then increase to 1 capsule twice a day if no improvement. 06/22/16 06/22/17 Yes Orbie Pyo, MD  levothyroxine (SYNTHROID, LEVOTHROID) 50 MCG tablet Take 1 tablet (50 mcg total) by mouth daily. 11/07/15  Yes Megan P Johnson, DO  OVER THE COUNTER MEDICATION Vitamins   Yes Historical Provider, MD  CIPRODEX otic  suspension  04/20/16   Historical Provider, MD  polyethylene glycol powder (GLYCOLAX/MIRALAX) powder  05/21/16   Historical Provider, MD    History reviewed. No pertinent family history.   Social History  Substance Use Topics  . Smoking status: Never Smoker  . Smokeless tobacco: Never Used  . Alcohol use Yes     Comment: on occasion    Allergies as of 06/28/2016 - Review Complete 06/28/2016  Allergen Reaction Noted  . Doxazosin Other (See Comments) 11/06/2015    Review of Systems:    All systems reviewed and negative except where noted in HPI.   Physical Exam:  BP 103/72   Pulse 65   Temp 97.8 F (36.6 C) (Oral)   Ht 6\' 1"  (1.854 m)   Wt 180 lb (81.6 kg)   BMI 23.75 kg/m  No LMP for male patient. Psych:  Alert and cooperative. Normal mood and affect. General:   Alert,  Well-developed, well-nourished, pleasant and cooperative in NAD Head:  Normocephalic and atraumatic. Eyes:  Sclera clear, no icterus.   Conjunctiva pink. Ears:  Normal auditory acuity. Nose:  No deformity, discharge, or lesions. Mouth:  No deformity or lesions,oropharynx pink & moist. Neck:  Supple; no masses or thyromegaly. Lungs:  Respirations even and unlabored.  Clear throughout to auscultation.   No wheezes, crackles, or rhonchi. No acute distress. Heart:  Regular rate  and rhythm; no murmurs, clicks, rubs, or gallops. Abdomen:  Normal bowel sounds.  No bruits.  Soft, non-tender and non-distended without masses, hepatosplenomegaly or hernias noted.  No guarding or rebound tenderness.  Negative Carnett sign.   Rectal:  Deferred.  Msk:  Symmetrical without gross deformities.  Good, equal movement & strength bilaterally. Pulses:  Normal pulses noted. Extremities:  No clubbing or edema.  No cyanosis. Neurologic:  Alert and oriented x3;  grossly normal neurologically. Skin:  Intact without significant lesions or rashes.  No jaundice. Lymph Nodes:  No significant cervical adenopathy. Psych:  Alert and  cooperative. Normal mood and affect.  Imaging Studies: No results found.  Assessment and Plan:   Marcus Wright is a 60 y.o. y/o male who comes in with constipation and the patient did have some weight loss but has gained some that weight back. There is no report of any black stools or bloody stools. The patient has not had a colonoscopy in many years. The patient will be set up for colonoscopy. I have discussed risks & benefits which include, but are not limited to, bleeding, infection, perforation & drug reaction.  The patient agrees with this plan & written consent will be obtained.      Note: This dictation was prepared with Dragon dictation along with smaller phrase technology. Any transcriptional errors that result from this process are unintentional.

## 2016-06-30 ENCOUNTER — Other Ambulatory Visit: Payer: Self-pay

## 2016-07-10 DIAGNOSIS — H9313 Tinnitus, bilateral: Secondary | ICD-10-CM | POA: Diagnosis not present

## 2016-07-14 ENCOUNTER — Encounter: Payer: Self-pay | Admitting: *Deleted

## 2016-07-15 NOTE — Discharge Instructions (Signed)

## 2016-07-19 ENCOUNTER — Ambulatory Visit
Admission: RE | Admit: 2016-07-19 | Discharge: 2016-07-19 | Disposition: A | Discharge status: Home / Self Care | Payer: Medicare Other | Source: Ambulatory Visit | Attending: Gastroenterology | Admitting: Gastroenterology

## 2016-07-19 ENCOUNTER — Ambulatory Visit: Payer: Medicare Other | Admitting: Anesthesiology

## 2016-07-19 ENCOUNTER — Encounter
Admission: RE | Disposition: A | Discharge status: Home / Self Care | Payer: Self-pay | Source: Ambulatory Visit | Attending: Gastroenterology

## 2016-07-19 ENCOUNTER — Ambulatory Visit: Admit: 2016-07-19 | Payer: Medicare Other | Admitting: Gastroenterology

## 2016-07-19 DIAGNOSIS — Z87891 Personal history of nicotine dependence: Secondary | ICD-10-CM | POA: Insufficient documentation

## 2016-07-19 DIAGNOSIS — K5900 Constipation, unspecified: Secondary | ICD-10-CM | POA: Insufficient documentation

## 2016-07-19 DIAGNOSIS — I10 Essential (primary) hypertension: Secondary | ICD-10-CM | POA: Diagnosis not present

## 2016-07-19 DIAGNOSIS — Z96651 Presence of right artificial knee joint: Secondary | ICD-10-CM | POA: Insufficient documentation

## 2016-07-19 DIAGNOSIS — R194 Change in bowel habit: Secondary | ICD-10-CM

## 2016-07-19 DIAGNOSIS — K648 Other hemorrhoids: Secondary | ICD-10-CM | POA: Insufficient documentation

## 2016-07-19 HISTORY — PX: COLONOSCOPY WITH PROPOFOL: SHX5780

## 2016-07-19 HISTORY — DX: Presence of dental prosthetic device (complete) (partial): Z97.2

## 2016-07-19 HISTORY — DX: Presence of external hearing-aid: Z97.4

## 2016-07-19 SURGERY — COLONOSCOPY WITH PROPOFOL
Anesthesia: Monitor Anesthesia Care | Wound class: Contaminated

## 2016-07-19 SURGERY — COLONOSCOPY WITH PROPOFOL
Anesthesia: General

## 2016-07-19 MED ORDER — STERILE WATER FOR IRRIGATION IR SOLN
Status: DC | PRN
  Administered 2016-07-19: 08:00:00

## 2016-07-19 MED ORDER — LACTATED RINGERS IV SOLN
INTRAVENOUS | Status: DC
  Administered 2016-07-19: 08:00:00 via INTRAVENOUS

## 2016-07-19 MED ORDER — OXYCODONE HCL 5 MG PO TABS: 5.0000 mg | ORAL_TABLET | Freq: Once | ORAL | Status: DC | PRN

## 2016-07-19 MED ORDER — LIDOCAINE HCL (CARDIAC) 20 MG/ML IV SOLN
INTRAVENOUS | Status: DC | PRN
  Administered 2016-07-19: 50 mg via INTRAVENOUS

## 2016-07-19 MED ORDER — OXYCODONE HCL 5 MG/5ML PO SOLN: 5.0000 mg | Freq: Once | ORAL | Status: DC | PRN

## 2016-07-19 MED ORDER — PROPOFOL 10 MG/ML IV BOLUS
INTRAVENOUS | Status: DC | PRN
  Administered 2016-07-19 (×3): 20 mg via INTRAVENOUS
  Administered 2016-07-19: 50 mg via INTRAVENOUS
  Administered 2016-07-19: 20 mg via INTRAVENOUS

## 2016-07-19 SURGICAL SUPPLY — 23 items

## 2016-07-19 NOTE — Op Note (Signed)
[facility] Gastroenterology Patient Name: Marcus Wright Procedure Date: 07/19/2016 8:21 AM MRN: [id number] Account #: [id number] Date of Birth: 03-02-1956 Admit Type: Outpatient Age: 60 Room: [facility] OR ROOM 01 Gender: Male Note Status: Finalized Procedure:            Colonoscopy Indications:          Change in bowel habits, Constipation Providers:            Lucilla Lame MD, MD Referring MD:         Valerie Roys (Referring MD) Medicines:            Propofol per Anesthesia Complications:        No immediate complications. Procedure:            Pre-Anesthesia Assessment:                       - Prior to the procedure, a History and Physical was                        performed, and patient medications and allergies were                        reviewed. The patient's tolerance of previous                        anesthesia was also reviewed. The risks and benefits of                        the procedure and the sedation options and risks were                        discussed with the patient. All questions were                        answered, and informed consent was obtained. Prior                        Anticoagulants: The patient has taken no previous                        anticoagulant or antiplatelet agents. ASA Grade                        Assessment: II - A patient with mild systemic disease.                        After reviewing the risks and benefits, the patient was                        deemed in satisfactory condition to undergo the                        procedure.                       After obtaining informed consent, the colonoscope was                        passed under direct vision. Throughout the procedure,  the patient's blood pressure, pulse, and oxygen                        saturations were monitored continuously. The Olympus                        CF-HQ190L Colonoscope (S#. [id number]) was introduced          through the anus and advanced to the the cecum,                        identified by appendiceal orifice and ileocecal valve.                        The colonoscopy was performed without difficulty. The                        patient tolerated the procedure well. The quality of                        the bowel preparation was excellent. Findings:      The perianal and digital rectal examinations were normal.      Non-bleeding internal hemorrhoids were found during retroflexion. The       hemorrhoids were Grade II (internal hemorrhoids that prolapse but reduce       spontaneously). Impression:           - Non-bleeding internal hemorrhoids.                       - No specimens collected. Recommendation:       - Discharge patient to home.                       - Resume previous diet.                       - Continue present medications. Procedure Code(s):    --- Professional ---                       [phone], Colonoscopy, flexible; diagnostic, including                        collection of specimen(s) by brushing or washing, when                        performed (separate procedure) Diagnosis Code(s):    --- Professional ---                       R19.4, Change in bowel habit                       K59.00, Constipation, unspecified CPT copyright 2016 American Medical Association. All rights reserved. The codes documented in this report are preliminary and upon coder review may  be revised to meet current compliance requirements. Lucilla Lame MD, MD 07/19/2016 8:38:19 AM This report has been signed electronically. Number of Addenda: 0 Note Initiated On: 07/19/2016 8:21 AM Scope Withdrawal Time: 0 hours 6 minutes 32 seconds  Total Procedure Duration: 0 hours 10 minutes 24 seconds       [facility]

## 2016-07-19 NOTE — Transfer of Care (Signed)
Immediate Anesthesia Transfer of Care Note  Patient: Marcus Wright  Procedure(s) Performed: Procedure(s): COLONOSCOPY WITH PROPOFOL (N/A)  Patient Location: PACU  Anesthesia Type: MAC  Level of Consciousness: awake, alert  and patient cooperative  Airway and Oxygen Therapy: Patient Spontanous Breathing and Patient connected to supplemental oxygen  Post-op Assessment: Post-op Vital signs reviewed, Patient's Cardiovascular Status Stable, Respiratory Function Stable, Patent Airway and No signs of Nausea or vomiting  Post-op Vital Signs: Reviewed and stable  Complications: No apparent anesthesia complications

## 2016-07-19 NOTE — H&P (Signed)
Lucilla Lame, MD [facility] [address] [place], [place] [address] Phone: [phone] Fax : [phone]  Primary Care Physician:  Park Liter, DO Primary Gastroenterologist:  Dr. Allen Norris  Pre-Procedure History & Physical: HPI:  Marcus Wright is a 60 y.o. male is here for an colonoscopy.   Past Medical History:  Diagnosis Date  . Cancer [facility]) 2003   skin'  . Hypertension   . Thyroid disorder   . Wears dentures    full upper  . Wears hearing aid     Past Surgical History:  Procedure Laterality Date  . BACK SURGERY  2011  . BREAST SURGERY Right 11-22-12  . CHOLECYSTECTOMY    . COLONOSCOPY    . REPLACEMENT TOTAL KNEE Right 1993  . SINUS SURGERY WITH INSTATRAK  2003   cancer    Prior to Admission medications   Medication Sig Start Date End Date Taking? Authorizing Provider  amLODipine (NORVASC) 2.5 MG tablet Take 1 tablet (2.5 mg total) by mouth daily. 06/21/16  Yes Megan P Johnson, DO  gabapentin (NEURONTIN) 300 MG capsule Take 1 capsule at bedtime for 4 days then increase to 1 capsule twice a day if no improvement. 06/22/16 06/22/17 Yes Orbie Pyo, MD  levothyroxine (SYNTHROID, LEVOTHROID) 50 MCG tablet Take 1 tablet (50 mcg total) by mouth daily. 11/07/15  Yes Megan P Johnson, DO  Multiple Vitamin (MULTIVITAMIN) tablet Take 1 tablet by mouth daily.   Yes Historical Provider, MD  polyethylene glycol-electrolytes (TRILYTE) 420 g solution Take 4,000 mLs by mouth as directed. Drink one 8 oz glass every 20 mins until stools are clear. 06/28/16  Yes Lucilla Lame, MD  esomeprazole (NEXIUM) 20 MG capsule Take 20 mg by mouth daily as needed.    Historical Provider, MD    Allergies as of 06/30/2016 - Review Complete 06/28/2016  Allergen Reaction Noted  . Doxazosin Other (See Comments) 11/06/2015    History reviewed. No pertinent family history.  Social History   Social History  . Marital status: Married    Spouse name: N/A  . Number of children: N/A  .  Years of education: N/A   Occupational History  . Not on file.   Social History Main Topics  . Smoking status: Former Research scientist (life sciences)  . Smokeless tobacco: Never Used     Comment: quit 20+ yrs ago  . Alcohol use 3.6 oz/week    6 Cans of beer per week     Comment: on occasion  . Drug use: No  . Sexual activity: Not on file   Other Topics Concern  . Not on file   Social History Narrative  . No narrative on file    Review of Systems: See HPI, otherwise negative ROS  Physical Exam: BP (!) 143/95   Pulse 73   Temp 97.4 F (36.3 C)   Resp 16   Ht 6\' 1"  (1.854 m)   Wt 177 lb (80.3 kg)   SpO2 100%   BMI 23.35 kg/m  General:   Alert,  pleasant and cooperative in NAD Head:  Normocephalic and atraumatic. Neck:  Supple; no masses or thyromegaly. Lungs:  Clear throughout to auscultation.    Heart:  Regular rate and rhythm. Abdomen:  Soft, nontender and nondistended. Normal bowel sounds, without guarding, and without rebound.   Neurologic:  Alert and  oriented x4;  grossly normal neurologically.  Impression/Plan: Marcus Wright is here for an colonoscopy to be performed for constipation  Risks, benefits, limitations, and alternatives regarding  colonoscopy have been reviewed with the patient.  Questions have been answered.  All parties agreeable.   Lucilla Lame, MD  07/19/2016, 7:47 AM

## 2016-07-19 NOTE — Anesthesia Postprocedure Evaluation (Signed)
Anesthesia Post Note  Patient: Marcus Wright  Procedure(s) Performed: Procedure(s) (LRB): COLONOSCOPY WITH PROPOFOL (N/A)  Patient location during evaluation: PACU Anesthesia Type: MAC Level of consciousness: awake and alert Pain management: pain level controlled Vital Signs Assessment: post-procedure vital signs reviewed and stable Respiratory status: spontaneous breathing Cardiovascular status: blood pressure returned to baseline Postop Assessment: no headache Anesthetic complications: no    Jaci Standard, III,  Desiray Orchard D

## 2016-07-19 NOTE — Anesthesia Preprocedure Evaluation (Addendum)
Anesthesia Evaluation  Patient identified by MRN, date of birth, ID band Patient awake    Reviewed: Allergy & Precautions, H&P , NPO status , Patient's Chart, lab work & pertinent test results  Airway Mallampati: II  TM Distance: >3 FB Neck ROM: full    Dental no notable dental hx.    Pulmonary former smoker,    Pulmonary exam normal        Cardiovascular hypertension, On Medications Normal cardiovascular exam     Neuro/Psych    GI/Hepatic negative GI ROS, Neg liver ROS,   Endo/Other  negative endocrine ROS  Renal/GU negative Renal ROS     Musculoskeletal   Abdominal   Peds  Hematology negative hematology ROS (+)   Anesthesia Other Findings   Reproductive/Obstetrics negative OB ROS                            Anesthesia Physical Anesthesia Plan  ASA: II  Anesthesia Plan: MAC   Post-op Pain Management:    Induction:   Airway Management Planned:   Additional Equipment:   Intra-op Plan:   Post-operative Plan:   Informed Consent:   Plan Discussed with:   Anesthesia Plan Comments:         Anesthesia Quick Evaluation

## 2016-07-19 NOTE — Anesthesia Procedure Notes (Signed)
Procedure Name: MAC Performed by: Shajuana Mclucas Pre-anesthesia Checklist: Patient identified, Emergency Drugs available, Suction available, Timeout performed and Patient being monitored Patient Re-evaluated:Patient Re-evaluated prior to inductionOxygen Delivery Method: Nasal cannula Placement Confirmation: positive ETCO2       

## 2016-07-20 ENCOUNTER — Encounter: Payer: Self-pay | Admitting: Gastroenterology

## 2016-08-26 DIAGNOSIS — H6533 Chronic mucoid otitis media, bilateral: Secondary | ICD-10-CM | POA: Insufficient documentation

## 2016-08-26 DIAGNOSIS — H6532 Chronic mucoid otitis media, left ear: Secondary | ICD-10-CM | POA: Diagnosis not present

## 2016-08-26 DIAGNOSIS — L919 Hypertrophic disorder of the skin, unspecified: Secondary | ICD-10-CM | POA: Diagnosis not present

## 2016-08-26 DIAGNOSIS — D481 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: Secondary | ICD-10-CM | POA: Insufficient documentation

## 2016-08-26 DIAGNOSIS — H61899 Other specified disorders of external ear, unspecified ear: Secondary | ICD-10-CM | POA: Diagnosis not present

## 2016-09-04 ENCOUNTER — Other Ambulatory Visit: Payer: Self-pay | Admitting: Family Medicine

## 2016-09-08 ENCOUNTER — Other Ambulatory Visit: Payer: Self-pay | Admitting: Orthopedic Surgery

## 2016-09-08 DIAGNOSIS — M5412 Radiculopathy, cervical region: Secondary | ICD-10-CM | POA: Diagnosis not present

## 2016-09-08 DIAGNOSIS — Z9889 Other specified postprocedural states: Secondary | ICD-10-CM

## 2016-09-09 DIAGNOSIS — H6531 Chronic mucoid otitis media, right ear: Secondary | ICD-10-CM | POA: Diagnosis not present

## 2016-09-09 DIAGNOSIS — H6532 Chronic mucoid otitis media, left ear: Secondary | ICD-10-CM | POA: Diagnosis not present

## 2016-09-09 DIAGNOSIS — H6061 Unspecified chronic otitis externa, right ear: Secondary | ICD-10-CM | POA: Diagnosis not present

## 2016-09-15 ENCOUNTER — Other Ambulatory Visit: Payer: Self-pay | Admitting: Family Medicine

## 2016-09-21 ENCOUNTER — Other Ambulatory Visit: Payer: Self-pay | Admitting: Orthopedic Surgery

## 2016-09-21 DIAGNOSIS — M5412 Radiculopathy, cervical region: Secondary | ICD-10-CM

## 2016-09-23 DIAGNOSIS — H906 Mixed conductive and sensorineural hearing loss, bilateral: Secondary | ICD-10-CM | POA: Insufficient documentation

## 2016-09-23 DIAGNOSIS — H6061 Unspecified chronic otitis externa, right ear: Secondary | ICD-10-CM | POA: Insufficient documentation

## 2016-09-29 NOTE — OR Nursing (Signed)
Called pt for pre myelogram instruction, wife answered due to pt being severely hearing disabled. When instructed that he will be here for 5-6 hours wife said they would not be able to do that 1/5 could we rescheduled it for 1/12. When I called back with new date and time wife said pt was refusing it. I encouraged her to call Ordering physician and let them know and she said they knew. I called [facility] to let them know pt is declining myelogram. They were going to call pt and will call scheduling to cancel if pt and MD agree.

## 2016-10-01 ENCOUNTER — Ambulatory Visit: Admission: RE | Admit: 2016-10-01 | Payer: Medicare Other | Source: Ambulatory Visit

## 2016-10-01 ENCOUNTER — Ambulatory Visit: Payer: Medicare Other

## 2016-10-07 ENCOUNTER — Encounter: Payer: Self-pay | Admitting: Emergency Medicine

## 2016-10-07 ENCOUNTER — Emergency Department
Admission: EM | Admit: 2016-10-07 | Discharge: 2016-10-07 | Disposition: A | Discharge status: Home / Self Care | Payer: Medicare Other | Attending: Emergency Medicine | Admitting: Emergency Medicine

## 2016-10-07 DIAGNOSIS — I10 Essential (primary) hypertension: Secondary | ICD-10-CM | POA: Insufficient documentation

## 2016-10-07 DIAGNOSIS — M542 Cervicalgia: Secondary | ICD-10-CM | POA: Insufficient documentation

## 2016-10-07 DIAGNOSIS — G8929 Other chronic pain: Secondary | ICD-10-CM

## 2016-10-07 DIAGNOSIS — Z87891 Personal history of nicotine dependence: Secondary | ICD-10-CM | POA: Diagnosis not present

## 2016-10-07 DIAGNOSIS — Z85828 Personal history of other malignant neoplasm of skin: Secondary | ICD-10-CM | POA: Diagnosis not present

## 2016-10-07 DIAGNOSIS — Z79899 Other long term (current) drug therapy: Secondary | ICD-10-CM | POA: Insufficient documentation

## 2016-10-07 MED ORDER — KETOROLAC TROMETHAMINE 60 MG/2ML IM SOLN
30.0000 mg | Freq: Once | INTRAMUSCULAR | Status: AC
  Administered 2016-10-07: 30 mg via INTRAMUSCULAR
  Filled 2016-10-07: qty 2

## 2016-10-07 MED ORDER — HYDROMORPHONE HCL 1 MG/ML IJ SOLN
1.0000 mg | Freq: Once | INTRAMUSCULAR | Status: AC
  Administered 2016-10-07: 1 mg via INTRAMUSCULAR
  Filled 2016-10-07: qty 1

## 2016-10-07 NOTE — ED Notes (Signed)
Pt alert and oriented X4, active, cooperative, pt in NAD. RR even and unlabored, color WNL.  Pt informed to return if any life threatening symptoms occur.   

## 2016-10-07 NOTE — ED Provider Notes (Signed)
[facility] Emergency Department Provider Note   ____________________________________________   First MD Initiated Contact with Patient 10/07/16 2147     (approximate)  I have reviewed the triage vital signs and the nursing notes.   HISTORY  Chief Complaint Back Pain   HPI Marcus Wright is a 61 y.o. male patient complain of proximal 1 month of neck and upper back pain. Patient state he is scheduled for CT myelogram in the morning. Patient stated no relief or gabapentin for his pain. Patient is asked for pain relief until his procedure tomorrow morning. Patient rates his pain as a 10 over 10. Patient described a pain as "sharp".  Past Medical History:  Diagnosis Date  . Cancer [facility]) 2003   skin'  . Hypertension   . Thyroid disorder   . Wears dentures    full upper  . Wears hearing aid     Patient Active Problem List   Diagnosis Date Noted  . Change in bowel habits   . Colonic constipation   . Hyperlipidemia 05/21/2016  . History of sinus cancer   . Hypertension   . Thyroid disorder   . Injury of chest wall 02/27/2014  . Thoracic sprain and strain 02/27/2014  . Cervical radicular pain 02/07/2014  . Cervical strain, acute 02/07/2014  . History of neck surgery 02/07/2014  . Radicular pain 02/07/2014  . Strain of neck muscle 02/07/2014  . Gynecomastia, male 06/20/2013    Past Surgical History:  Procedure Laterality Date  . BACK SURGERY  2011  . BREAST SURGERY Right 11-22-12  . CHOLECYSTECTOMY    . COLONOSCOPY    . COLONOSCOPY WITH PROPOFOL N/A 07/19/2016   Procedure: COLONOSCOPY WITH PROPOFOL;  Surgeon: Lucilla Lame, MD;  Location: [place];  Service: Endoscopy;  Laterality: N/A;  . REPLACEMENT TOTAL KNEE Right 1993  . SINUS SURGERY WITH INSTATRAK  2003   cancer    Prior to Admission medications   Medication Sig Start Date End Date Taking? Authorizing Provider  amLODipine (NORVASC) 2.5 MG tablet Take 1 tablet (2.5 mg  total) by mouth daily. 06/21/16   Megan P Johnson, DO  esomeprazole (NEXIUM) 20 MG capsule Take 20 mg by mouth daily as needed.    Historical Provider, MD  gabapentin (NEURONTIN) 300 MG capsule Take 1 capsule at bedtime for 4 days then increase to 1 capsule twice a day if no improvement. 06/22/16 06/22/17  Orbie Pyo, MD  levothyroxine (SYNTHROID, LEVOTHROID) 50 MCG tablet Take 1 tablet (50 mcg total) by mouth daily. 11/07/15   Megan P Johnson, DO  Multiple Vitamin (MULTIVITAMIN) tablet Take 1 tablet by mouth daily.    Historical Provider, MD  polyethylene glycol-electrolytes (TRILYTE) 420 g solution Take 4,000 mLs by mouth as directed. Drink one 8 oz glass every 20 mins until stools are clear. 06/28/16   Lucilla Lame, MD    Allergies Doxazosin  No family history on file.  Social History Social History  Substance Use Topics  . Smoking status: Former Research scientist (life sciences)  . Smokeless tobacco: Never Used     Comment: quit 20+ yrs ago  . Alcohol use 3.6 oz/week    6 Cans of beer per week     Comment: on occasion    Review of Systems Constitutional: No fever/chills Eyes: No visual changes. ENT: No sore throat. Wear hearing aids Cardiovascular: Denies chest pain. Respiratory: Denies shortness of breath. Gastrointestinal: No abdominal pain.  No nausea, no vomiting.  No diarrhea.  No constipation.  Genitourinary: Negative for dysuria. Musculoskeletal: Negative for back pain. Skin: Negative for rash. Neurological: Negative for headaches, focal weakness or numbness. Endocrine:Hypertension   ____________________________________________   PHYSICAL EXAM:  VITAL SIGNS: ED Triage Vitals  Enc Vitals Group     BP 10/07/16 2135 (!) 100/56     Pulse Rate 10/07/16 2135 76     Resp 10/07/16 2135 18     Temp 10/07/16 2135 97.3 F (36.3 C)     Temp Source 10/07/16 2135 Oral     SpO2 10/07/16 2135 97 %     Weight 10/07/16 2131 175 lb (79.4 kg)     Height 10/07/16 2131 6\' 1"  (1.854 m)      Head Circumference --      Peak Flow --      Pain Score 10/07/16 2131 10     Pain Loc --      Pain Edu? --      Excl. in [place]? --     Constitutional: Alert and oriented. Well appearing and in no acute distress. Eyes: Conjunctivae are normal. PERRL. EOMI. Head: Atraumatic. Nose: No congestion/rhinnorhea. Mouth/Throat: Mucous membranes are moist.  Oropharynx non-erythematous. Neck: No stridor.   cervical spine tenderness to palpation C4-C6. Hematological/Lymphatic/Immunilogical: No cervical lymphadenopathy. Cardiovascular: Normal rate, regular rhythm. Grossly normal heart sounds.  Good peripheral circulation. Respiratory: Normal respiratory effort.  No retractions. Lungs CTAB. Gastrointestinal: Soft and nontender. No distention. No abdominal bruits. No CVA tenderness. Musculoskeletal: No lower extremity tenderness nor edema.  No joint effusions. Neurologic:  Normal speech and language. No gross focal neurologic deficits are appreciated. No gait instability. Skin:  Skin is warm, dry and intact. No rash noted. Psychiatric: Mood and affect are normal. Speech and behavior are normal.  ____________________________________________   LABS (all labs ordered are listed, but only abnormal results are displayed)  Labs Reviewed - No data to display ____________________________________________  EKG   ____________________________________________  RADIOLOGY   ____________________________________________   PROCEDURES  Procedure(s) performed: None  Procedures  Critical Care performed: No  ____________________________________________   INITIAL IMPRESSION / ASSESSMENT AND PLAN / ED COURSE  Pertinent labs & imaging results that were available during my care of the patient were reviewed by me and considered in my medical decision making (see chart for details).  Cervical pain. Patient given discharge care instructions. Patient advised to follow-up with scheduled imaging in the  morning.  Clinical Course      ____________________________________________   FINAL CLINICAL IMPRESSION(S) / ED DIAGNOSES  Final diagnoses:  Neck pain of over 3 months duration      NEW MEDICATIONS STARTED DURING THIS VISIT:  New Prescriptions   No medications on file     Note:  This document was prepared using Dragon voice recognition software and may include unintentional dictation errors.    Sable Feil, PA-C 10/07/16 2155    Lisa Roca, MD 10/07/16 [phone]

## 2016-10-07 NOTE — Discharge Instructions (Signed)
Follow-up with scheduled imaging in the morning.

## 2016-10-07 NOTE — ED Triage Notes (Signed)
Pt to triage via w/c with no distress noted; pt reports x 3-4 weeks having; has appt in the morning for a CT scan of neck & back; st has ruptured disk "in my shoulder blades" and no pain relief from gabapentin

## 2016-10-08 ENCOUNTER — Ambulatory Visit: Payer: Medicare Other

## 2016-10-08 ENCOUNTER — Ambulatory Visit
Admission: RE | Admit: 2016-10-08 | Discharge: 2016-10-08 | Disposition: A | Discharge status: Home / Self Care | Payer: Medicare Other | Source: Ambulatory Visit | Attending: Orthopedic Surgery | Admitting: Orthopedic Surgery

## 2016-10-22 ENCOUNTER — Other Ambulatory Visit: Payer: Self-pay | Admitting: Orthopedic Surgery

## 2016-10-22 DIAGNOSIS — M5412 Radiculopathy, cervical region: Secondary | ICD-10-CM

## 2016-10-29 ENCOUNTER — Ambulatory Visit
Admission: RE | Admit: 2016-10-29 | Discharge: 2016-10-29 | Disposition: A | Discharge status: Home / Self Care | Payer: Medicare Other | Source: Ambulatory Visit | Attending: Orthopedic Surgery | Admitting: Orthopedic Surgery

## 2016-10-29 DIAGNOSIS — M4722 Other spondylosis with radiculopathy, cervical region: Secondary | ICD-10-CM | POA: Insufficient documentation

## 2016-10-29 DIAGNOSIS — M4802 Spinal stenosis, cervical region: Secondary | ICD-10-CM | POA: Insufficient documentation

## 2016-10-29 DIAGNOSIS — M5412 Radiculopathy, cervical region: Secondary | ICD-10-CM

## 2016-10-29 DIAGNOSIS — Z981 Arthrodesis status: Secondary | ICD-10-CM | POA: Insufficient documentation

## 2016-10-29 DIAGNOSIS — M4322 Fusion of spine, cervical region: Secondary | ICD-10-CM | POA: Diagnosis not present

## 2016-10-29 MED ORDER — IOPAMIDOL (ISOVUE-M 300) INJECTION 61%
15.0000 mL | Freq: Once | INTRAMUSCULAR | Status: AC | PRN
  Administered 2016-10-29: 15 mL via INTRATHECAL

## 2016-11-09 ENCOUNTER — Other Ambulatory Visit: Payer: Self-pay | Admitting: Family Medicine

## 2016-11-17 DIAGNOSIS — H6531 Chronic mucoid otitis media, right ear: Secondary | ICD-10-CM | POA: Diagnosis not present

## 2016-11-17 DIAGNOSIS — H9193 Unspecified hearing loss, bilateral: Secondary | ICD-10-CM | POA: Diagnosis not present

## 2016-11-17 DIAGNOSIS — H906 Mixed conductive and sensorineural hearing loss, bilateral: Secondary | ICD-10-CM | POA: Diagnosis not present

## 2016-11-17 DIAGNOSIS — M8730 Other secondary osteonecrosis, unspecified bone: Secondary | ICD-10-CM | POA: Diagnosis not present

## 2016-11-17 DIAGNOSIS — Y842 Radiological procedure and radiotherapy as the cause of abnormal reaction of the patient, or of later complication, without mention of misadventure at the time of the procedure: Secondary | ICD-10-CM | POA: Diagnosis not present

## 2016-11-17 DIAGNOSIS — H6061 Unspecified chronic otitis externa, right ear: Secondary | ICD-10-CM | POA: Diagnosis not present

## 2016-11-17 DIAGNOSIS — Z974 Presence of external hearing-aid: Secondary | ICD-10-CM | POA: Diagnosis not present

## 2016-12-20 ENCOUNTER — Ambulatory Visit (INDEPENDENT_AMBULATORY_CARE_PROVIDER_SITE_OTHER): Payer: Medicare Other | Admitting: Family Medicine

## 2016-12-20 ENCOUNTER — Encounter: Payer: Self-pay | Admitting: Family Medicine

## 2016-12-20 VITALS — BP 138/83 | HR 76 | Temp 98.5°F | Resp 17 | Ht 72.1 in | Wt 174.0 lb

## 2016-12-20 DIAGNOSIS — Z1159 Encounter for screening for other viral diseases: Secondary | ICD-10-CM

## 2016-12-20 DIAGNOSIS — E079 Disorder of thyroid, unspecified: Secondary | ICD-10-CM

## 2016-12-20 DIAGNOSIS — I10 Essential (primary) hypertension: Secondary | ICD-10-CM | POA: Diagnosis not present

## 2016-12-20 DIAGNOSIS — Z0000 Encounter for general adult medical examination without abnormal findings: Secondary | ICD-10-CM | POA: Diagnosis not present

## 2016-12-20 DIAGNOSIS — Z114 Encounter for screening for human immunodeficiency virus [HIV]: Secondary | ICD-10-CM

## 2016-12-20 DIAGNOSIS — E782 Mixed hyperlipidemia: Secondary | ICD-10-CM | POA: Diagnosis not present

## 2016-12-20 DIAGNOSIS — Z8522 Personal history of malignant neoplasm of nasal cavities, middle ear, and accessory sinuses: Secondary | ICD-10-CM | POA: Diagnosis not present

## 2016-12-20 DIAGNOSIS — Z125 Encounter for screening for malignant neoplasm of prostate: Secondary | ICD-10-CM | POA: Diagnosis not present

## 2016-12-20 LAB — MICROALBUMIN, URINE WAIVED
Creatinine, Urine Waived: 50 mg/dL (ref 10–300)
Microalb, Ur Waived: 10 mg/L (ref 0–19)
Microalb/Creat Ratio: <30 mg/g (ref <30)

## 2016-12-20 LAB — UA/M W/RFLX CULTURE, ROUTINE
Bilirubin, UA: NEGATIVE
Glucose, UA: NEGATIVE
Ketones, UA: NEGATIVE
Leukocytes, UA: NEGATIVE
Nitrite, UA: NEGATIVE
Protein, UA: NEGATIVE
RBC, UA: NEGATIVE
Specific Gravity, UA: 1.015 (ref 1.005–1.030)
Urobilinogen, Ur: 0.2 mg/dL (ref 0.2–1.0)
pH, UA: 6.5 (ref 5.0–7.5)

## 2016-12-20 LAB — MICROSCOPIC EXAMINATION
Bacteria, UA: NONE SEEN
RBC, UA: NONE SEEN /hpf (ref 0–?)
WBC, UA: NONE SEEN /hpf (ref 0–?)

## 2016-12-20 MED ORDER — AMLODIPINE BESYLATE 2.5 MG PO TABS: 2.5000 mg | ORAL_TABLET | Freq: Every day | ORAL | 1 refills | Status: DC

## 2016-12-20 NOTE — Assessment & Plan Note (Signed)
Under fair control. Continue current regimen. Continue to monitor. Call with any concerns.  

## 2016-12-20 NOTE — Assessment & Plan Note (Signed)
Rechecking levels today. Will treat as needed. Call with any concerns.  

## 2016-12-20 NOTE — Assessment & Plan Note (Signed)
Now with dysphagia. Continue to follow with ENT- holding on stretching due to tissue quality.

## 2016-12-20 NOTE — Progress Notes (Signed)
BP 138/83 (BP Location: Left Arm, Patient Position: Sitting, Cuff Size: Normal)   Pulse 76   Temp 98.5 F (36.9 C) (Oral)   Resp 17   Ht 6' 0.1" (1.831 m)   Wt 174 lb (78.9 kg)   SpO2 100%   BMI 23.53 kg/m    Subjective:    Patient ID: Marcus Wright, male    DOB: 12/04/1955, 61 y.o.   MRN: [id number]  HPI: Marcus Wright is a 61 y.o. male presenting on 12/20/2016 for comprehensive medical examination. Current medical complaints include:  HYPERTENSION / HYPERLIPIDEMIA Satisfied with current treatment? yes Duration of hypertension: chronic BP monitoring frequency: not checking BP range:  BP medication side effects: no Past BP meds: amlodipine Duration of hyperlipidemia: chronic Cholesterol medication side effects: Not on anything Cholesterol supplements: none Past cholesterol medications: none Medication compliance: excellent compliance Aspirin: no Recent stressors: no Recurrent headaches: no Visual changes: no Palpitations: no Dyspnea: no Chest pain: no Lower extremity edema: no Dizzy/lightheaded: no  HYPOTHYROIDISM Thyroid control status:stable Satisfied with current treatment? yes Medication side effects: no Medication compliance: excellent compliance Recent dose adjustment:no Fatigue: no Cold intolerance: no Heat intolerance: no Weight gain: no Weight loss: no Constipation: yes Diarrhea/loose stools: no Palpitations: no Lower extremity edema: no Anxiety/depressed mood: no  DYSPHAGIA- due to radiation from the sinus cancer Duration: chronic Description of symptom: solid foods Onset: Immediately upon swallowing Location of dysphagia: throat Dysphagia to solids only: yes Dysphagia to solids & liquids: no  Frequency:constant  Progressively getting worse: no Alleviatiating factors: nothing Provoking factors: certain foods Status: stable EGD: no Weight loss: no Sensation of lump in throat: no Heartburn: no Odynophagia: no Nausea:  no Vomiting: no Drooling/nasal regurgitation/food spillage: no Coughing/choking/dysphonia: no Dysarthria: no Hematemesis: no Regurgitation of undigested food/halitosis: no Chest pain: no  Interim Problems from his last visit: no  Functional Status Survey: Is the patient deaf or have difficulty hearing?: Yes Does the patient have difficulty seeing, even when wearing glasses/contacts?: No Does the patient have difficulty concentrating, remembering, or making decisions?: No Does the patient have difficulty walking or climbing stairs?: No Does the patient have difficulty dressing or bathing?: No Does the patient have difficulty doing errands alone such as visiting a doctor's office or shopping?: No  FALL RISK: Fall Risk  12/20/2016  Falls in the past year? No    Depression Screen Depression screen [facility] 2/9 12/20/2016  Decreased Interest 0  Down, Depressed, Hopeless 0  PHQ - 2 Score 0    Advanced Directives SEE APPROPRIATE AREA OF CHART  Past Medical History:  Past Medical History:  Diagnosis Date  . Cancer [facility]) 2003   skin'  . Hypertension   . Thyroid disorder   . Wears dentures    full upper  . Wears hearing aid     Surgical History:  Past Surgical History:  Procedure Laterality Date  . BACK SURGERY  2011  . BREAST SURGERY Right 11-22-12  . CHOLECYSTECTOMY    . COLONOSCOPY    . COLONOSCOPY WITH PROPOFOL N/A 07/19/2016   Procedure: COLONOSCOPY WITH PROPOFOL;  Surgeon: Lucilla Lame, MD;  Location: [place];  Service: Endoscopy;  Laterality: N/A;  . REPLACEMENT TOTAL KNEE Right 1993  . SINUS SURGERY WITH INSTATRAK  2003   cancer    Medications:  Current Outpatient Prescriptions on File Prior to Visit  Medication Sig  . levothyroxine (SYNTHROID, LEVOTHROID) 50 MCG tablet TAKE 1 TABLET (50 MCG TOTAL) BY MOUTH DAILY.  . Multiple  Vitamin (MULTIVITAMIN) tablet Take 1 tablet by mouth daily.   No current facility-administered medications on file prior to  visit.     Allergies:  Allergies  Allergen Reactions  . Doxazosin Other (See Comments)    Not sure of reaction    Social History:  Social History   Social History  . Marital status: Married    Spouse name: N/A  . Number of children: N/A  . Years of education: N/A   Occupational History  . Not on file.   Social History Main Topics  . Smoking status: Former Research scientist (life sciences)  . Smokeless tobacco: Never Used     Comment: quit 20+ yrs ago  . Alcohol use 3.6 oz/week    6 Cans of beer per week     Comment: on occasion  . Drug use: No  . Sexual activity: Not on file   Other Topics Concern  . Not on file   Social History Narrative  . No narrative on file   History  Smoking Status  . Former Smoker  Smokeless Tobacco  . Never Used    Comment: quit 20+ yrs ago   History  Alcohol Use  . 3.6 oz/week  . 6 Cans of beer per week    Comment: on occasion    Family History:  No family history on file.  Past medical history, surgical history, medications, allergies, family history and social history reviewed with patient today and changes made to appropriate areas of the chart.   Review of Systems  Constitutional: Negative.   HENT: Positive for hearing loss. Negative for congestion, ear discharge, ear pain, nosebleeds, sinus pain, sore throat and tinnitus.   Eyes: Negative.   Respiratory: Negative.  Negative for stridor.   Cardiovascular: Negative.   Gastrointestinal: Positive for constipation. Negative for abdominal pain, blood in stool, diarrhea, heartburn, melena, nausea and vomiting.       Trouble swallowing- no meats  Genitourinary: Negative.   Musculoskeletal: Positive for back pain. Negative for falls, joint pain, myalgias and neck pain.  Skin: Negative.   Neurological: Negative.   Endo/Heme/Allergies: Positive for environmental allergies and polydipsia. Does not bruise/bleed easily.  Psychiatric/Behavioral: Negative.     All other ROS negative except what is listed  above and in the HPI.      Objective:    BP 138/83 (BP Location: Left Arm, Patient Position: Sitting, Cuff Size: Normal)   Pulse 76   Temp 98.5 F (36.9 C) (Oral)   Resp 17   Ht 6' 0.1" (1.831 m)   Wt 174 lb (78.9 kg)   SpO2 100%   BMI 23.53 kg/m   Wt Readings from Last 3 Encounters:  12/20/16 174 lb (78.9 kg)  10/29/16 179 lb (81.2 kg)  10/07/16 175 lb (79.4 kg)    Physical Exam  Constitutional: He is oriented to person, place, and time. He appears well-developed and well-nourished. No distress.  HENT:  Head: Normocephalic and atraumatic.  Right Ear: Hearing, tympanic membrane, external ear and ear canal normal.  Left Ear: Hearing, tympanic membrane, external ear and ear canal normal.  Nose: Nose normal.  Mouth/Throat: Uvula is midline, oropharynx is clear and moist and mucous membranes are normal. No oropharyngeal exudate.  Eyes: Conjunctivae, EOM and lids are normal. Pupils are equal, round, and reactive to light. Right eye exhibits no discharge. Left eye exhibits no discharge. No scleral icterus.  Neck: Normal range of motion. No JVD present. No tracheal deviation present. No thyromegaly present.  Cardiovascular: Normal rate,  regular rhythm, normal heart sounds and intact distal pulses.  Exam reveals no gallop and no friction rub.   No murmur heard. Pulmonary/Chest: Effort normal and breath sounds normal. No stridor. No respiratory distress. He has no wheezes. He has no rales. He exhibits no tenderness.  Abdominal: Soft. Bowel sounds are normal. He exhibits no distension and no mass. There is no tenderness. There is no rebound and no guarding. Hernia confirmed negative in the right inguinal area and confirmed negative in the left inguinal area.  Genitourinary: Rectum normal, prostate normal, testes normal and penis normal. Rectal exam shows no internal hemorrhoid, no fissure, no mass, no tenderness and anal tone normal. Prostate is not enlarged and not tender. Cremasteric reflex  is present. Right testis shows no mass, no swelling and no tenderness. Right testis is descended. Cremasteric reflex is not absent on the right side. Left testis shows no mass, no swelling and no tenderness. Left testis is descended. Cremasteric reflex is not absent on the left side. Circumcised. No penile tenderness.  Musculoskeletal: Normal range of motion. He exhibits no edema, tenderness or deformity.  Lymphadenopathy:    He has no cervical adenopathy.  Neurological: He is alert and oriented to person, place, and time. He has normal reflexes. He displays normal reflexes. No cranial nerve deficit. He exhibits normal muscle tone. Coordination normal.  Skin: Skin is warm, dry and intact. No rash noted. He is not diaphoretic. No erythema. No pallor.  Psychiatric: He has a normal mood and affect. His speech is normal and behavior is normal. Judgment and thought content normal. Cognition and memory are normal.  Nursing note and vitals reviewed.   6CIT Screen 12/20/2016  What Year? 0 points  What month? 0 points  What time? 0 points  Count back from 20 0 points  Months in reverse 2 points  Repeat phrase 4 points  Total Score 6     Results for orders placed or performed in visit on 05/21/16  CBC with Differential/Platelet  Result Value Ref Range   WBC 5.2 3.4 - 10.8 x10E3/uL   RBC 4.77 4.14 - 5.80 x10E6/uL   Hemoglobin 13.4 12.6 - 17.7 g/dL   Hematocrit 40.1 37.5 - 51.0 %   MCV 84 79 - 97 fL   MCH 28.1 26.6 - 33.0 pg   MCHC 33.4 31.5 - 35.7 g/dL   RDW 14.0 12.3 - 15.4 %   Platelets 254 150 - 379 x10E3/uL   Neutrophils 41 %   Lymphs 42 %   Monocytes 10 %   Eos 6 %   Basos 1 %   Neutrophils Absolute 2.1 1.4 - 7.0 x10E3/uL   Lymphocytes Absolute 2.2 0.7 - 3.1 x10E3/uL   Monocytes Absolute 0.5 0.1 - 0.9 x10E3/uL   EOS (ABSOLUTE) 0.3 0.0 - 0.4 x10E3/uL   Basophils Absolute 0.1 0.0 - 0.2 x10E3/uL   Immature Granulocytes 0 %   Immature Grans (Abs) 0.0 0.0 - 0.1 x10E3/uL    Comprehensive metabolic panel  Result Value Ref Range   Glucose 84 65 - 99 mg/dL   BUN 6 (L) 8 - 27 mg/dL   Creatinine, Ser 0.91 0.76 - 1.27 mg/dL   GFR calc non Af Amer 91 >59 mL/min/1.73   GFR calc Af Amer 105 >59 mL/min/1.73   BUN/Creatinine Ratio 7 (L) 10 - 24   Sodium 140 134 - 144 mmol/L   Potassium 4.4 3.5 - 5.2 mmol/L   Chloride 98 96 - 106 mmol/L   CO2 26 18 -  29 mmol/L   Calcium 9.4 8.6 - 10.2 mg/dL   Total Protein 6.5 6.0 - 8.5 g/dL   Albumin 4.4 3.6 - 4.8 g/dL   Globulin, Total 2.1 1.5 - 4.5 g/dL   Albumin/Globulin Ratio 2.1 1.2 - 2.2   Bilirubin Total 0.5 0.0 - 1.2 mg/dL   Alkaline Phosphatase 68 39 - 117 IU/L   AST 21 0 - 40 IU/L   ALT 15 0 - 44 IU/L  Lipid Panel Piccolo, Waived  Result Value Ref Range   Cholesterol Piccolo, Waived 251 (H) <200 mg/dL   HDL Chol Piccolo, Waived 47 (L) >59 mg/dL   Triglycerides Piccolo,Waived 292 (H) <150 mg/dL   Chol/HDL Ratio Piccolo,Waive 5.4 (H) mg/dL   LDL Chol Calc Piccolo Waived 146 (H) <100 mg/dL   VLDL Chol Calc Piccolo,Waive 58 (H) <30 mg/dL  Microalbumin, Urine Waived  Result Value Ref Range   Microalb, Ur Waived 10 0 - 19 mg/L   Creatinine, Urine Waived 10 10 - 300 mg/dL   Microalb/Creat Ratio <30 <30 mg/g  UA/M w/rflx Culture, Routine  Result Value Ref Range   Specific Gravity, UA 1.010 1.005 - 1.030   pH, UA 7.0 5.0 - 7.5   Color, UA Yellow Yellow   Appearance Ur Clear Clear   Leukocytes, UA Negative Negative   Protein, UA Negative Negative/Trace   Glucose, UA Negative Negative   Ketones, UA Negative Negative   RBC, UA Negative Negative   Bilirubin, UA Negative Negative   Urobilinogen, Ur 0.2 0.2 - 1.0 mg/dL   Nitrite, UA Negative Negative  PSA  Result Value Ref Range   Prostate Specific Ag, Serum 0.5 0.0 - 4.0 ng/mL  TSH  Result Value Ref Range   TSH 1.580 0.450 - 4.500 uIU/mL      Assessment & Plan:   Problem List Items Addressed This Visit      Cardiovascular and Mediastinum   Hypertension     Under fair control. Continue current regimen. Continue to monitor. Call with any concerns.       Relevant Medications   amLODipine (NORVASC) 2.5 MG tablet   Other Relevant Orders   CBC with Differential/Platelet   Comprehensive metabolic panel   Microalbumin, Urine Waived   UA/M w/rflx Culture, Routine     Endocrine   Thyroid disorder    Rechecking levels today. Will adjust dose as needed. Call with any concerns.       Relevant Orders   CBC with Differential/Platelet   Comprehensive metabolic panel   TSH     Other   History of sinus cancer    Now with dysphagia. Continue to follow with ENT- holding on stretching due to tissue quality.      Hyperlipidemia    Rechecking levels today. Will treat as needed. Call with any concerns.       Relevant Medications   amLODipine (NORVASC) 2.5 MG tablet   Other Relevant Orders   CBC with Differential/Platelet   Comprehensive metabolic panel   Lipid Panel w/o Chol/HDL Ratio    Other Visit Diagnoses    Medicare annual wellness visit, subsequent    -  Primary   Preventative care discussed today. Vaccines up to date. Labs checked. Colonoscopy up to date. Continue to monitor.    Screening for prostate cancer       Checking labs today. Await results.    Relevant Orders   PSA   Screening for HIV without presence of risk factors  Checking labs today. Await results.    Relevant Orders   HIV antibody   Need for hepatitis C screening test       Checking labs today. Await results.    Relevant Orders   Hepatitis C Antibody       Preventative Services:  Health Risk Assessment and Personalized Prevention Plan: Done today Bone Mass Measurements: N/A CVD Screening: Done today Colon Cancer Screening: Up to date Depression Screening: Done today Diabetes Screening: Done today Glaucoma Screening: See your eye doctor Hepatitis B vaccine: N/A Hepatitis C screening: Done today HIV Screening: Done today Flu Vaccine: Up to date Lung  cancer Screening: N/A Obesity Screening: Done today Pneumonia Vaccines (2): N/A STI Screening: N/A PSA screening: Done today  Discussed aspirin prophylaxis for myocardial infarction prevention and decision was made to start ASA  LABORATORY TESTING:  Health maintenance labs ordered today as discussed above.   The natural history of prostate cancer and ongoing controversy regarding screening and potential treatment outcomes of prostate cancer has been discussed with the patient. The meaning of a false positive PSA and a false negative PSA has been discussed. He indicates understanding of the limitations of this screening test and wishes  to proceed with screening PSA testing.   IMMUNIZATIONS:   - Tdap: Tetanus vaccination status reviewed: last tetanus booster within 10 years. - Influenza: Up to date - Pneumovax: Not applicable - Prevnar: Not applicable - Zostavax vaccine: Not applicable  SCREENING: - Colonoscopy: Up to date  Discussed with patient purpose of the colonoscopy is to detect colon cancer at curable precancerous or early stages   PATIENT COUNSELING:    Sexuality: Discussed sexually transmitted diseases, partner selection, use of condoms, avoidance of unintended pregnancy  and contraceptive alternatives.   Advised to avoid cigarette smoking.  I discussed with the patient that most people either abstain from alcohol or drink within safe limits (<=14/week and <=4 drinks/occasion for males, <=7/weeks and <= 3 drinks/occasion for females) and that the risk for alcohol disorders and other health effects rises proportionally with the number of drinks per week and how often a drinker exceeds daily limits.  Discussed cessation/primary prevention of drug use and availability of treatment for abuse.   Diet: Encouraged to adjust caloric intake to maintain  or achieve ideal body weight, to reduce intake of dietary saturated fat and total fat, to limit sodium intake by avoiding high  sodium foods and not adding table salt, and to maintain adequate dietary potassium and calcium preferably from fresh fruits, vegetables, and low-fat dairy products.    stressed the importance of regular exercise  Injury prevention: Discussed safety belts, safety helmets, smoke detector, smoking near bedding or upholstery.   Dental health: Discussed importance of regular tooth brushing, flossing, and dental visits.   Follow up plan: NEXT PREVENTATIVE PHYSICAL DUE IN 1 YEAR. Return in about 6 months (around 06/22/2017) for Follow up BP/Cholesterol.

## 2016-12-20 NOTE — Patient Instructions (Addendum)
Preventative Services:  Health Risk Assessment and Personalized Prevention Plan: Done today Bone Mass Measurements: N/A CVD Screening: Done today Colon Cancer Screening: Up to date Depression Screening: Done today Diabetes Screening: Done today Glaucoma Screening: See your eye doctor Hepatitis B vaccine: N/A Hepatitis C screening: Done today HIV Screening: Done today Flu Vaccine: Up to date Lung cancer Screening: N/A Obesity Screening: Done today Pneumonia Vaccines (2): N/A STI Screening: N/A PSA screening: Done today   Health Maintenance, Male A healthy lifestyle and preventive care is important for your health and wellness. Ask your health care provider about what schedule of regular examinations is right for you. What should I know about weight and diet?  Eat a Healthy Diet  Eat plenty of vegetables, fruits, whole grains, low-fat dairy products, and lean protein.  Do not eat a lot of foods high in solid fats, added sugars, or salt. Maintain a Healthy Weight  Regular exercise can help you achieve or maintain a healthy weight. You should:  Do at least 150 minutes of exercise each week. The exercise should increase your heart rate and make you sweat (moderate-intensity exercise).  Do strength-training exercises at least twice a week. Watch Your Levels of Cholesterol and Blood Lipids  Have your blood tested for lipids and cholesterol every 5 years starting at 63 years of age. If you are at high risk for heart disease, you should start having your blood tested when you are 63 years old. You may need to have your cholesterol levels checked more often if:  Your lipid or cholesterol levels are high.  You are older than 63 years of age.  You are at high risk for heart disease. What should I know about cancer screening? Many types of cancers can be detected early and may often be prevented. Lung Cancer  You should be screened every year for lung cancer if:  You are a current  smoker who has smoked for at least 30 years.  You are a former smoker who has quit within the past 15 years.  Talk to your health care provider about your screening options, when you should start screening, and how often you should be screened. Colorectal Cancer  Routine colorectal cancer screening usually begins at 63 years of age and should be repeated every 5-10 years until you are 63 years old. You may need to be screened more often if early forms of precancerous polyps or small growths are found. Your health care provider may recommend screening at an earlier age if you have risk factors for colon cancer.  Your health care provider may recommend using home test kits to check for hidden blood in the stool.  A small camera at the end of a tube can be used to examine your colon (sigmoidoscopy or colonoscopy). This checks for the earliest forms of colorectal cancer. Prostate and Testicular Cancer  Depending on your age and overall health, your health care provider may do certain tests to screen for prostate and testicular cancer.  Talk to your health care provider about any symptoms or concerns you have about testicular or prostate cancer. Skin Cancer  Check your skin from head to toe regularly.  Tell your health care provider about any new moles or changes in moles, especially if:  There is a change in a mole's size, shape, or color.  You have a mole that is larger than a pencil eraser.  Always use sunscreen. Apply sunscreen liberally and repeat throughout the day.  Protect yourself by  wearing long sleeves, pants, a wide-brimmed hat, and sunglasses when outside. What should I know about heart disease, diabetes, and high blood pressure?  If you are 62-29 years of age, have your blood pressure checked every 3-5 years. If you are 2 years of age or older, have your blood pressure checked every year. You should have your blood pressure measured twice-once when you are at a hospital or  clinic, and once when you are not at a hospital or clinic. Record the average of the two measurements. To check your blood pressure when you are not at a hospital or clinic, you can use:  An automated blood pressure machine at a pharmacy.  A home blood pressure monitor.  Talk to your health care provider about your target blood pressure.  If you are between 82-17 years old, ask your health care provider if you should take aspirin to prevent heart disease.  Have regular diabetes screenings by checking your fasting blood sugar level.  If you are at a normal weight and have a low risk for diabetes, have this test once every three years after the age of 87.  If you are overweight and have a high risk for diabetes, consider being tested at a younger age or more often.  A one-time screening for abdominal aortic aneurysm (AAA) by ultrasound is recommended for men aged 7-75 years who are current or former smokers. What should I know about preventing infection? Hepatitis B  If you have a higher risk for hepatitis B, you should be screened for this virus. Talk with your health care provider to find out if you are at risk for hepatitis B infection. Hepatitis C  Blood testing is recommended for:  Everyone born from [birth year] through 1967.  Anyone with known risk factors for hepatitis C. Sexually Transmitted Diseases (STDs)  You should be screened each year for STDs including gonorrhea and chlamydia if:  You are sexually active and are younger than 63 years of age.  You are older than 64 years of age and your health care provider tells you that you are at risk for this type of infection.  Your sexual activity has changed since you were last screened and you are at an increased risk for chlamydia or gonorrhea. Ask your health care provider if you are at risk.  Talk with your health care provider about whether you are at high risk of being infected with HIV. Your health care provider may recommend a  prescription medicine to help prevent HIV infection. What else can I do?  Schedule regular health, dental, and eye exams.  Stay current with your vaccines (immunizations).  Do not use any tobacco products, such as cigarettes, chewing tobacco, and e-cigarettes. If you need help quitting, ask your health care provider.  Limit alcohol intake to no more than 2 drinks per day. One drink equals 12 ounces of beer, 5 ounces of wine, or 1 ounces of hard liquor.  Do not use street drugs.  Do not share needles.  Ask your health care provider for help if you need support or information about quitting drugs.  Tell your health care provider if you often feel depressed.  Tell your health care provider if you have ever been abused or do not feel safe at home. This information is not intended to replace advice given to you by your health care provider. Make sure you discuss any questions you have with your health care provider. Document Released: 03/11/2008 Document Revised: 05/12/2016 Document Reviewed:  06/17/2015 Elsevier Interactive Patient Education  2017 [employer] Directive Advance directives are legal documents that let you make choices ahead of time about your health care and medical treatment in case you become unable to communicate for yourself. Advance directives are a way for you to communicate your wishes to family, friends, and health care providers. This can help convey your decisions about end-of-life care if you become unable to communicate. Discussing and writing advance directives should happen over time rather than all at once. Advance directives can be changed depending on your situation and what you want, even after you have signed the advance directives. If you do not have an advance directive, some states assign family decision makers to act on your behalf based on how closely you are related to them. Each state has its own laws regarding advance directives. You may  want to check with your health care provider, attorney, or state representative about the laws in your state. There are different types of advance directives, such as:  Medical power of attorney.  Living will.  Do not resuscitate (DNR) or do not attempt resuscitation (DNAR) order. Health care proxy and medical power of attorney A health care proxy, also called a health care agent, is a person who is appointed to make medical decisions for you in cases in which you are unable to make the decisions yourself. Generally, people choose someone they know well and trust to represent their preferences. Make sure to ask this person for an agreement to act as your proxy. A proxy may have to exercise judgment in the event of a medical decision for which your wishes are not known. A medical power of attorney is a legal document that names your health care proxy. Depending on the laws in your state, after the document is written, it may also need to be:  Signed.  Notarized.  Dated.  Copied.  Witnessed.  Incorporated into your medical record. You may also want to appoint someone to manage your financial affairs in a situation in which you are unable to do so. This is called a durable power of attorney for finances. It is a separate legal document from the durable power of attorney for health care. You may choose the same person or someone different from your health care proxy to act as your agent in financial matters. If you do not appoint a proxy, or if there is a concern that the proxy is not acting in your best interests, a court-appointed guardian may be designated to act on your behalf. Living will A living will is a set of instructions documenting your wishes about medical care when you cannot express them yourself. Health care providers should keep a copy of your living will in your medical record. You may want to give a copy to family members or friends. To alert caregivers in case of an  emergency, you can place a card in your wallet to let them know that you have a living will and where they can find it. A living will is used if you become:  Terminally ill.  Incapacitated.  Unable to communicate or make decisions. Items to consider in your living will include:  The use or non-use of life-sustaining equipment, such as dialysis machines and breathing machines (ventilators).  A DNR or DNAR order, which is the instruction not to use cardiopulmonary resuscitation (CPR) if breathing or heartbeat stops.  The use or non-use of tube feeding.  Withholding of food and fluids.  Comfort (palliative) care when the goal becomes comfort rather than a cure.  Organ and tissue donation. A living will does not give instructions for distributing your money and property if you should pass away. It is recommended that you seek the advice of a lawyer when writing a will. Decisions about taxes, beneficiaries, and asset distribution will be legally binding. This process can relieve your family and friends of any concerns surrounding disputes or questions that may come up about the distribution of your assets. DNR or DNAR A DNR or DNAR order is a request not to have CPR in the event that your heart stops beating or you stop breathing. If a DNR or DNAR order has not been made and shared, a health care provider will try to help any patient whose heart has stopped or who has stopped breathing. If you plan to have surgery, talk with your health care provider about how your DNR or DNAR order will be followed if problems occur. Summary  Advance directives are the legal documents that allow you to make choices ahead of time about your health care and medical treatment in case you become unable to communicate for yourself.  The process of discussing and writing advance directives should happen over time. You can change the advance directives, even after you have signed them.  Advance directives include  DNR or DNAR orders, living wills, and designating an agent as your medical power of attorney. This information is not intended to replace advice given to you by your health care provider. Make sure you discuss any questions you have with your health care provider. Document Released: 12/21/2007 Document Revised: 08/02/2016 Document Reviewed: 08/02/2016 Elsevier Interactive Patient Education  2017 [employer].

## 2016-12-20 NOTE — Assessment & Plan Note (Signed)
Rechecking levels today. Will adjust dose as needed. Call with any concerns.  

## 2016-12-21 ENCOUNTER — Encounter: Payer: Self-pay | Admitting: Family Medicine

## 2016-12-21 LAB — TSH: TSH: 1.44 u[IU]/mL (ref 0.450–4.500)

## 2016-12-21 LAB — COMPREHENSIVE METABOLIC PANEL
ALT: 14 IU/L (ref 0–44)
AST: 17 IU/L (ref 0–40)
Albumin/Globulin Ratio: 1.8 (ref 1.2–2.2)
Albumin: 4.4 g/dL (ref 3.6–4.8)
Alkaline Phosphatase: 64 IU/L (ref 39–117)
BUN/Creatinine Ratio: 15 (ref 10–24)
BUN: 12 mg/dL (ref 8–27)
Bilirubin Total: 0.4 mg/dL (ref 0.0–1.2)
CO2: 26 mmol/L (ref 18–29)
Calcium: 9.5 mg/dL (ref 8.6–10.2)
Chloride: 98 mmol/L (ref 96–106)
Creatinine, Ser: 0.82 mg/dL (ref 0.76–1.27)
GFR calc Af Amer: 110 mL/min/{1.73_m2} (ref >59)
GFR calc non Af Amer: 95 mL/min/{1.73_m2} (ref >59)
Globulin, Total: 2.4 g/dL (ref 1.5–4.5)
Glucose: 90 mg/dL (ref 65–99)
Potassium: 4.4 mmol/L (ref 3.5–5.2)
Sodium: 139 mmol/L (ref 134–144)
Total Protein: 6.8 g/dL (ref 6.0–8.5)

## 2016-12-21 LAB — CBC WITH DIFFERENTIAL/PLATELET
Basophils Absolute: 0 10*3/uL (ref 0.0–0.2)
Basos: 1 %
EOS (ABSOLUTE): 0.2 10*3/uL (ref 0.0–0.4)
Eos: 4 %
Hematocrit: 37.6 % (ref 37.5–51.0)
Hemoglobin: 13.4 g/dL (ref 13.0–17.7)
Immature Grans (Abs): 0 10*3/uL (ref 0.0–0.1)
Immature Granulocytes: 0 %
Lymphocytes Absolute: 2.2 10*3/uL (ref 0.7–3.1)
Lymphs: 44 %
MCH: 29.3 pg (ref 26.6–33.0)
MCHC: 35.6 g/dL (ref 31.5–35.7)
MCV: 82 fL (ref 79–97)
Monocytes Absolute: 0.5 10*3/uL (ref 0.1–0.9)
Monocytes: 11 %
Neutrophils Absolute: 2 10*3/uL (ref 1.4–7.0)
Neutrophils: 40 %
Platelets: 248 10*3/uL (ref 150–379)
RBC: 4.58 x10E6/uL (ref 4.14–5.80)
RDW: 13.9 % (ref 12.3–15.4)
WBC: 5 10*3/uL (ref 3.4–10.8)

## 2016-12-21 LAB — LIPID PANEL W/O CHOL/HDL RATIO
Cholesterol, Total: 261 mg/dL — ABNORMAL HIGH (ref 100–199)
HDL: 40 mg/dL (ref >39)
LDL Calculated: 178 mg/dL — ABNORMAL HIGH (ref 0–99)
Triglycerides: 213 mg/dL — ABNORMAL HIGH (ref 0–149)
VLDL Cholesterol Cal: 43 mg/dL — ABNORMAL HIGH (ref 5–40)

## 2016-12-21 LAB — PSA: Prostate Specific Ag, Serum: 0.6 ng/mL (ref 0.0–4.0)

## 2016-12-21 LAB — HIV ANTIBODY (ROUTINE TESTING W REFLEX): HIV Screen 4th Generation wRfx: NONREACTIVE

## 2016-12-21 LAB — HEPATITIS C ANTIBODY: Hep C Virus Ab: <0.1 s/co ratio (ref 0.0–0.9)

## 2016-12-21 MED ORDER — LEVOTHYROXINE SODIUM 50 MCG PO TABS: 50.0000 ug | ORAL_TABLET | Freq: Every day | ORAL | 3 refills | Status: DC

## 2016-12-31 DIAGNOSIS — H6061 Unspecified chronic otitis externa, right ear: Secondary | ICD-10-CM | POA: Diagnosis not present

## 2016-12-31 DIAGNOSIS — R9089 Other abnormal findings on diagnostic imaging of central nervous system: Secondary | ICD-10-CM | POA: Diagnosis not present

## 2016-12-31 DIAGNOSIS — M799 Soft tissue disorder, unspecified: Secondary | ICD-10-CM | POA: Diagnosis not present

## 2016-12-31 DIAGNOSIS — H6531 Chronic mucoid otitis media, right ear: Secondary | ICD-10-CM | POA: Diagnosis not present

## 2017-01-04 ENCOUNTER — Other Ambulatory Visit: Payer: Self-pay | Admitting: Family Medicine

## 2017-01-19 DIAGNOSIS — H938X1 Other specified disorders of right ear: Secondary | ICD-10-CM | POA: Diagnosis not present

## 2017-01-19 DIAGNOSIS — H9193 Unspecified hearing loss, bilateral: Secondary | ICD-10-CM | POA: Diagnosis not present

## 2017-01-24 DIAGNOSIS — H9193 Unspecified hearing loss, bilateral: Secondary | ICD-10-CM | POA: Insufficient documentation

## 2017-01-24 DIAGNOSIS — H938X1 Other specified disorders of right ear: Secondary | ICD-10-CM | POA: Insufficient documentation

## 2017-02-08 DIAGNOSIS — G44029 Chronic cluster headache, not intractable: Secondary | ICD-10-CM | POA: Insufficient documentation

## 2017-02-08 DIAGNOSIS — M1990 Unspecified osteoarthritis, unspecified site: Secondary | ICD-10-CM | POA: Insufficient documentation

## 2017-02-26 ENCOUNTER — Encounter: Payer: Self-pay | Admitting: Emergency Medicine

## 2017-02-26 ENCOUNTER — Emergency Department
Admission: EM | Admit: 2017-02-26 | Discharge: 2017-02-26 | Disposition: A | Discharge status: Home / Self Care | Payer: Medicare Other | Attending: Emergency Medicine | Admitting: Emergency Medicine

## 2017-02-26 ENCOUNTER — Emergency Department: Payer: Medicare Other

## 2017-02-26 DIAGNOSIS — Z79899 Other long term (current) drug therapy: Secondary | ICD-10-CM | POA: Diagnosis not present

## 2017-02-26 DIAGNOSIS — J181 Lobar pneumonia, unspecified organism: Secondary | ICD-10-CM | POA: Insufficient documentation

## 2017-02-26 DIAGNOSIS — Z87891 Personal history of nicotine dependence: Secondary | ICD-10-CM | POA: Insufficient documentation

## 2017-02-26 DIAGNOSIS — I10 Essential (primary) hypertension: Secondary | ICD-10-CM | POA: Diagnosis not present

## 2017-02-26 DIAGNOSIS — Z85828 Personal history of other malignant neoplasm of skin: Secondary | ICD-10-CM | POA: Insufficient documentation

## 2017-02-26 DIAGNOSIS — J189 Pneumonia, unspecified organism: Secondary | ICD-10-CM

## 2017-02-26 DIAGNOSIS — Z9049 Acquired absence of other specified parts of digestive tract: Secondary | ICD-10-CM | POA: Diagnosis not present

## 2017-02-26 DIAGNOSIS — R0602 Shortness of breath: Secondary | ICD-10-CM | POA: Diagnosis present

## 2017-02-26 LAB — COMPREHENSIVE METABOLIC PANEL
ALT: 12 U/L — ABNORMAL LOW (ref 17–63)
AST: 19 U/L (ref 15–41)
Albumin: 4.4 g/dL (ref 3.5–5.0)
Alkaline Phosphatase: 66 U/L (ref 38–126)
Anion gap: 7 (ref 5–15)
BUN: 13 mg/dL (ref 6–20)
CO2: 29 mmol/L (ref 22–32)
Calcium: 9 mg/dL (ref 8.9–10.3)
Chloride: 103 mmol/L (ref 101–111)
Creatinine, Ser: 0.93 mg/dL (ref 0.61–1.24)
GFR calc Af Amer: >60 mL/min (ref >60)
GFR calc non Af Amer: >60 mL/min (ref >60)
Glucose, Bld: 113 mg/dL — ABNORMAL HIGH (ref 65–99)
Potassium: 3.5 mmol/L (ref 3.5–5.1)
Sodium: 139 mmol/L (ref 135–145)
Total Bilirubin: 0.8 mg/dL (ref 0.3–1.2)
Total Protein: 7.3 g/dL (ref 6.5–8.1)

## 2017-02-26 LAB — CBC
HCT: 39 % — ABNORMAL LOW (ref 40.0–52.0)
Hemoglobin: 13.6 g/dL (ref 13.0–18.0)
MCH: 29.6 pg (ref 26.0–34.0)
MCHC: 35 g/dL (ref 32.0–36.0)
MCV: 84.7 fL (ref 80.0–100.0)
Platelets: 262 10*3/uL (ref 150–440)
RBC: 4.61 MIL/uL (ref 4.40–5.90)
RDW: 13.5 % (ref 11.5–14.5)
WBC: 9.3 10*3/uL (ref 3.8–10.6)

## 2017-02-26 LAB — TROPONIN I: Troponin I: <0.03 ng/mL (ref <0.03)

## 2017-02-26 MED ORDER — LEVOFLOXACIN 500 MG PO TABS: 500.0000 mg | ORAL_TABLET | Freq: Every day | ORAL | 0 refills | Status: AC

## 2017-02-26 NOTE — ED Triage Notes (Signed)
Pt brought in by [facility] from home. Per EMS they were called out for shortness of breath. EMS reports that when they arrived pts pupils were pin point. Pt was seen at [facility] yesterday for ear pain and given oxycodone. Pt states that since he woke up this morning around 0900 he has felt short of breath. Pt currently 99% on room air.

## 2017-02-26 NOTE — ED Provider Notes (Signed)
[facility] Emergency Department Provider Note   ____________________________________________    I have reviewed the triage vital signs and the nursing notes.   HISTORY  Chief Complaint Shortness of Breath  Patient is very hard of hearing   HPI Marcus Wright is a 61 y.o. male who presents with complaints of shortness of breath. Per EMS patient felt short of breath when getting out of bed today. He was recently seen at [facility] for an ongoing right ear problem, he is apparently on antibiotic drops for this. He denies fevers or chills. Does have a productive cough. No calf pain or swelling. No chest pain.has ear surgery scheduled for the 6 of this month. Has ENT follow-up in 2 days   Past Medical History:  Diagnosis Date  . Cancer [facility]) 2003   skin'  . Hypertension   . Thyroid disorder   . Wears dentures    full upper  . Wears hearing aid     Patient Active Problem List   Diagnosis Date Noted  . Colonic constipation   . Hyperlipidemia 05/21/2016  . History of sinus cancer   . Hypertension   . Thyroid disorder   . Cervical radicular pain 02/07/2014  . History of neck surgery 02/07/2014  . Gynecomastia, male 06/20/2013    Past Surgical History:  Procedure Laterality Date  . BACK SURGERY  2011  . BREAST SURGERY Right 11-22-12  . CHOLECYSTECTOMY    . COLONOSCOPY    . COLONOSCOPY WITH PROPOFOL N/A 07/19/2016   Procedure: COLONOSCOPY WITH PROPOFOL;  Surgeon: Lucilla Lame, MD;  Location: [facility];  Service: Endoscopy;  Laterality: N/A;  . REPLACEMENT TOTAL KNEE Right 1993  . SINUS SURGERY WITH INSTATRAK  2003   cancer    Prior to Admission medications   Medication Sig Start Date End Date Taking? Authorizing Provider  amLODipine (NORVASC) 2.5 MG tablet Take 1 tablet (2.5 mg total) by mouth daily. 12/20/16  Yes Johnson, Megan P, DO  levothyroxine (SYNTHROID, LEVOTHROID) 50 MCG tablet Take 1 tablet (50 mcg total) by mouth daily.  12/21/16  Yes Johnson, Megan P, DO  Oxycodone HCl 10 MG TABS Take 5 mg by mouth every 6 (six) hours as needed. 02/22/17  Yes [provider]  levofloxacin (LEVAQUIN) 500 MG tablet Take 1 tablet (500 mg total) by mouth daily. 02/26/17 03/08/17  Lavonia Drafts, MD     Allergies Doxazosin  No family history on file.  Social History Social History  Substance Use Topics  . Smoking status: Former Research scientist (life sciences)  . Smokeless tobacco: Never Used     Comment: quit 20+ yrs ago  . Alcohol use 3.6 oz/week    6 Cans of beer per week     Comment: on occasion    Review of Systems  Constitutional: No fever/chills Eyes: No visual changes.  RKY:[facility] of hearing, ongoing right ear pain Cardiovascular: Denies chest pain. Respiratory:as above Gastrointestinal: No abdominal pain.  No nausea, no vomiting.   Genitourinary: Negative for dysuria. Musculoskeletal: Negative for back pain. Skin: Negative for rash. Neurological: Negative for headaches or weakness   ____________________________________________   PHYSICAL EXAM:  VITAL SIGNS: ED Triage Vitals  Enc Vitals Group     BP 02/26/17 1219 113/60     Pulse Rate 02/26/17 1219 77     Resp 02/26/17 1219 10     Temp 02/26/17 1219 98.1 F (36.7 C)     Temp Source 02/26/17 1219 Oral     SpO2 02/26/17  1210 99 %     Weight --      Height --      Head Circumference --      Peak Flow --      Pain Score --      Pain Loc --      Pain Edu? --      Excl. in [place]? --     Constitutional: Alert and oriented. No acute distress. Pleasant and interactive Eyes: Conjunctivae are normal.  Head: Atraumatic. Nose: No congestion/rhinnorhea. Ears: Right ear is slightly swollen, EOM swelling with clearish discharge Mouth/Throat: Mucous membranes are moist.    Cardiovascular: Normal rate, regular rhythm. Grossly normal heart sounds.  Good peripheral circulation. Respiratory: Normal respiratory effort.  No retractions. Lungs CTAB. Gastrointestinal: Soft and  nontender. No distention.  No CVA tenderness. Genitourinary: deferred Musculoskeletal: No lower extremity tenderness nor edema.  Warm and well perfused Neurologic:  Normal speech and language. No gross focal neurologic deficits are appreciated.  Skin:  Skin is warm, dry and intact. No rash noted. Psychiatric: Mood and affect are normal. Speech and behavior are normal.  ____________________________________________   LABS (all labs ordered are listed, but only abnormal results are displayed)  Labs Reviewed  CBC - Abnormal; Notable for the following:       Result Value   HCT 39.0 (*)    All other components within normal limits  COMPREHENSIVE METABOLIC PANEL - Abnormal; Notable for the following:    Glucose, Bld 113 (*)    ALT 12 (*)    All other components within normal limits  TROPONIN I   ____________________________________________  EKG  None ____________________________________________  RADIOLOGY  Left base pneumonia ____________________________________________   PROCEDURES  Procedure(s) performed: No    Critical Care performed: No ____________________________________________   INITIAL IMPRESSION / ASSESSMENT AND PLAN / ED COURSE  Pertinent labs & imaging results that were available during my care of the patient were reviewed by me and considered in my medical decision making (see chart for details).  Patient presents with complaints of shortness of breath, he is comfortable with normal vitals here in the emergency department. His ear swollen but based on review of records this is not new. He has appropriate follow-up scheduled for this. Lab work is overall reassuring. Pneumonia on chest x-ray.   Patient ambulating well, vitals unchanged. I feel he is appropriate for outpatient discharge, will d/c with levaquin. Patient and family comfortable with this plan    ____________________________________________   FINAL CLINICAL IMPRESSION(S) / ED  DIAGNOSES  Final diagnoses:  Community acquired pneumonia of left lower lobe of lung ([place])      NEW MEDICATIONS STARTED DURING THIS VISIT:  New Prescriptions   LEVOFLOXACIN (LEVAQUIN) 500 MG TABLET    Take 1 tablet (500 mg total) by mouth daily.     Note:  This document was prepared using Dragon voice recognition software and may include unintentional dictation errors.    Lavonia Drafts, MD 02/26/17 [phone]

## 2017-02-27 ENCOUNTER — Emergency Department
Admission: EM | Admit: 2017-02-27 | Discharge: 2017-02-27 | Disposition: A | Discharge status: Home / Self Care | Payer: Medicare Other | Attending: Emergency Medicine | Admitting: Emergency Medicine

## 2017-02-27 DIAGNOSIS — I10 Essential (primary) hypertension: Secondary | ICD-10-CM | POA: Diagnosis not present

## 2017-02-27 DIAGNOSIS — Z85828 Personal history of other malignant neoplasm of skin: Secondary | ICD-10-CM | POA: Insufficient documentation

## 2017-02-27 DIAGNOSIS — H60501 Unspecified acute noninfective otitis externa, right ear: Secondary | ICD-10-CM

## 2017-02-27 DIAGNOSIS — R05 Cough: Secondary | ICD-10-CM | POA: Insufficient documentation

## 2017-02-27 DIAGNOSIS — H9201 Otalgia, right ear: Secondary | ICD-10-CM

## 2017-02-27 DIAGNOSIS — Z87891 Personal history of nicotine dependence: Secondary | ICD-10-CM | POA: Insufficient documentation

## 2017-02-27 MED ORDER — LIDOCAINE HCL (PF) 1 % IJ SOLN
2.0000 mL | Freq: Once | INTRAMUSCULAR | Status: AC
  Administered 2017-02-27: 2 mL

## 2017-02-27 MED ORDER — LIDOCAINE HCL 4 % EX SOLN
Freq: Once | CUTANEOUS | Status: DC
  Filled 2017-02-27: qty 50

## 2017-02-27 MED ORDER — CIPROFLOXACIN-DEXAMETHASONE 0.3-0.1 % OT SUSP
4.0000 [drp] | Freq: Once | OTIC | Status: AC
  Administered 2017-02-27: 4 [drp] via OTIC
  Filled 2017-02-27: qty 7.5

## 2017-02-27 NOTE — ED Triage Notes (Signed)
Reports right ear pain.  Reports given antibiotics but states he is unable to swallow them.  Patient was diagnosed with pneumonia Friday.

## 2017-02-27 NOTE — Discharge Instructions (Signed)
1. Apply Ciprodex eardrops - 4 drops to right ear twice daily 7 days. 2. Your antibiotic (Levaquin) has been split in half for you. Please take as previously prescribed.  3. Return to the ER for worsening symptoms, persistent vomiting, difficulty breathing or other concerns.

## 2017-02-27 NOTE — ED Provider Notes (Signed)
[facility] Emergency Department Provider Note   ____________________________________________   First MD Initiated Contact with Patient 02/27/17 [phone]     (approximate)  I have reviewed the triage vital signs and the nursing notes.   HISTORY  Chief Complaint Otalgia    HPI Marcus Wright is a 61 y.o. male who presents to the ED from home with a chief complaint of right ear pain. Patient was seen yesterday in the emergency department and prescribed Levaquin for pneumonia. Patient states he is unable to swallow the Levaquin pills because they are too big.Complains of pain and swelling to his right ear. Spouse states he has an ENT appointment with Dr. Richardson Landry on Monday but he could not wait the weekend. Also is scheduled for surgery at [facility] next week for right ear surgery. Patient denies recent swimming or discharge from the ear. Denies fall/injury/trauma. Denies fever, chills, chest pain, shortness of breath, abdominal pain, nausea, vomiting, diarrhea. Denies recent travel or trauma. Nothing makes his symptoms better or worse.   Past Medical History:  Diagnosis Date  . Cancer [facility]) 2003   skin'  . Hypertension   . Thyroid disorder   . Wears dentures    full upper  . Wears hearing aid     Patient Active Problem List   Diagnosis Date Noted  . Colonic constipation   . Hyperlipidemia 05/21/2016  . History of sinus cancer   . Hypertension   . Thyroid disorder   . Cervical radicular pain 02/07/2014  . History of neck surgery 02/07/2014  . Gynecomastia, male 06/20/2013    Past Surgical History:  Procedure Laterality Date  . BACK SURGERY  2011  . BREAST SURGERY Right 11-22-12  . CHOLECYSTECTOMY    . COLONOSCOPY    . COLONOSCOPY WITH PROPOFOL N/A 07/19/2016   Procedure: COLONOSCOPY WITH PROPOFOL;  Surgeon: Lucilla Lame, MD;  Location: [facility];  Service: Endoscopy;  Laterality: N/A;  . REPLACEMENT TOTAL KNEE Right 1993  . SINUS SURGERY  WITH INSTATRAK  2003   cancer    Prior to Admission medications   Medication Sig Start Date End Date Taking? Authorizing Provider  amLODipine (NORVASC) 2.5 MG tablet Take 1 tablet (2.5 mg total) by mouth daily. 12/20/16   Johnson, Megan P, DO  levofloxacin (LEVAQUIN) 500 MG tablet Take 1 tablet (500 mg total) by mouth daily. 02/26/17 03/08/17  Lavonia Drafts, MD  levothyroxine (SYNTHROID, LEVOTHROID) 50 MCG tablet Take 1 tablet (50 mcg total) by mouth daily. 12/21/16   Johnson, Megan P, DO  Oxycodone HCl 10 MG TABS Take 5 mg by mouth every 6 (six) hours as needed. 02/22/17   [provider]    Allergies Doxazosin  No family history on file.  Social History Social History  Substance Use Topics  . Smoking status: Former Research scientist (life sciences)  . Smokeless tobacco: Never Used     Comment: quit 20+ yrs ago  . Alcohol use 3.6 oz/week    6 Cans of beer per week     Comment: on occasion    Review of Systems  Constitutional: No fever/chills. Eyes: No visual changes. ENT: Positive for pain and swelling to right ear. No sore throat. Cardiovascular: Denies chest pain. Respiratory: Positive for cough. Denies shortness of breath. Gastrointestinal: No abdominal pain.  No nausea, no vomiting.  No diarrhea.  No constipation. Genitourinary: Negative for dysuria. Musculoskeletal: Negative for back pain. Skin: Negative for rash. Neurological: Negative for headaches, focal weakness or numbness.   ____________________________________________  PHYSICAL EXAM:  VITAL SIGNS: ED Triage Vitals  Enc Vitals Group     BP 02/27/17 0027 96/64     Pulse Rate 02/27/17 0027 79     Resp 02/27/17 0027 20     Temp 02/27/17 0027 98.6 F (37 C)     Temp Source 02/27/17 0027 Oral     SpO2 02/27/17 0027 99 %     Weight 02/27/17 0025 175 lb (79.4 kg)     Height 02/27/17 0025 6\' 1"  (1.854 m)     Head Circumference --      Peak Flow --      Pain Score 02/27/17 0246 10     Pain Loc --      Pain Edu? --       Excl. in [place]? --     Constitutional: Alert and oriented. Well appearing and in mild acute distress. Eyes: Conjunctivae are normal. PERRL. EOMI. Head: Atraumatic. Ears: Hearing device to left ear. Right external canal swollen with debris. Unable to visualize TM secondary to swelling. Nose: No congestion/rhinnorhea. Mouth/Throat: Mucous membranes are moist.  Oropharynx non-erythematous. Neck: No stridor.  Supple neck without meningismus. Cardiovascular: Normal rate, regular rhythm. Grossly normal heart sounds.  Good peripheral circulation. Respiratory: Normal respiratory effort.  No retractions. Lungs CTAB. Gastrointestinal: Soft and nontender. No distention. No abdominal bruits. No CVA tenderness. Musculoskeletal: No lower extremity tenderness nor edema.  No joint effusions. Neurologic:  Normal speech and language. No gross focal neurologic deficits are appreciated. No gait instability. Skin:  Skin is warm, dry and intact. No rash noted. Psychiatric: Mood and affect are normal. Speech and behavior are normal.  ____________________________________________   LABS (all labs ordered are listed, but only abnormal results are displayed)  Labs Reviewed - No data to display ____________________________________________  EKG  None ____________________________________________  RADIOLOGY  Dg Chest 2 View  Result Date: 02/26/2017 CLINICAL DATA:  Shortness of breath EXAM: CHEST  2 VIEW COMPARISON:  12/10/2011 FINDINGS: Patchy airspace disease noted left lung base. Right lung clear. The cardiopericardial silhouette is within normal limits for size. The visualized bony structures of the thorax are intact. IMPRESSION: Patchy airspace disease left base suggests pneumonia. Electronically Signed   By: Misty Stanley M.D.   On: 02/26/2017 14:06    ____________________________________________   PROCEDURES  Procedure(s) performed: None  Procedures  Critical Care performed:  No  ____________________________________________   INITIAL IMPRESSION / ASSESSMENT AND PLAN / ED COURSE  Pertinent labs & imaging results that were available during my care of the patient were reviewed by me and considered in my medical decision making (see chart for details).  61 year old male who presents with right otalgia secondary to otitis externa. Will place ear wick, Ciprodex and lidocaine for pain. Nurse will split patient's Levaquin for him so that he may be able to swallow them strict return precautions given. Patient and spouse verbalize understanding and agree with plan of care.      ____________________________________________   FINAL CLINICAL IMPRESSION(S) / ED DIAGNOSES  Final diagnoses:  Otalgia of right ear  Acute otitis externa of right ear, unspecified type      NEW MEDICATIONS STARTED DURING THIS VISIT:  New Prescriptions   No medications on file     Note:  This document was prepared using Dragon voice recognition software and may include unintentional dictation errors.    Paulette Blanch, MD 02/27/17 [phone]

## 2017-02-27 NOTE — ED Notes (Signed)
Pt seen here earlier on Saturday and diagnosed with left lower lobe pneumonia; return tonight with "burning" in his right ear that he is unable to tolerate at home; pt assisted to wheelchair upon arrival;

## 2017-05-10 ENCOUNTER — Ambulatory Visit (INDEPENDENT_AMBULATORY_CARE_PROVIDER_SITE_OTHER): Payer: Medicare Other | Admitting: Family Medicine

## 2017-05-10 VITALS — BP 121/74 | HR 64 | Temp 97.9°F

## 2017-05-10 DIAGNOSIS — H903 Sensorineural hearing loss, bilateral: Secondary | ICD-10-CM

## 2017-05-10 DIAGNOSIS — H9190 Unspecified hearing loss, unspecified ear: Secondary | ICD-10-CM | POA: Insufficient documentation

## 2017-05-10 DIAGNOSIS — H9213 Otorrhea, bilateral: Secondary | ICD-10-CM | POA: Diagnosis not present

## 2017-05-10 DIAGNOSIS — Z8522 Personal history of malignant neoplasm of nasal cavities, middle ear, and accessory sinuses: Secondary | ICD-10-CM | POA: Diagnosis not present

## 2017-05-10 NOTE — Assessment & Plan Note (Addendum)
Will get him into a different ENT. Referral generated today to see [facility], however they are out of network. Referral to [facility] ENT generated today.

## 2017-05-10 NOTE — Patient Instructions (Addendum)
Marcus Wright- you do not have any wax in your ears today. They are clear, but they are very moist. We've gotten a referral for you to see a different ENT in [place]  [address], [place]. [name]  [name], [name] [address]  [phone]

## 2017-05-10 NOTE — Assessment & Plan Note (Signed)
Would like another opinion.  Referral to [facility] ENT made today. Await appointment. Ears clear today, no sign of any wax.

## 2017-05-10 NOTE — Progress Notes (Signed)
BP 121/74 (BP Location: Left Arm, Patient Position: Sitting, Cuff Size: Normal)   Pulse 64   Temp 97.9 F (36.6 C)   SpO2 99%    Subjective:    Patient ID: Marcus Wright, male    DOB: [birth year], 61 y.o.   MRN: [id number]  HPI: Marcus Wright is a 61 y.o. male-  Chief Complaint  Patient presents with  . Cerumen Impaction   EAG CLOGGED- ENT doesn't want to see him any more because of insurance. Referred him to [facility] and he is not happy with the care there. Has had infection in his R ear for a while. He would like to see someone else. Interview limited by severe hearing loss. Duration: months Involved ear(s):  bilateral R>L Sensation of feeling clogged/plugged: yes Decreased/muffled hearing:yes Ear pain: yes Fever: no Otorrhea: yes Hearing loss: yes Upper respiratory infection symptoms: no Using Q-Tips: no Status: worse History of cerumenosis: yes Treatments attempted: antibiotics, has seen ENT a couple of times   Relevant past medical, surgical, family and social history reviewed and updated as indicated. Interim medical history since our last visit reviewed. Allergies and medications reviewed and updated.  Review of Systems  Constitutional: Negative.   HENT: Positive for ear discharge and hearing loss. Negative for congestion, dental problem, drooling, ear pain, facial swelling, mouth sores, nosebleeds, postnasal drip, rhinorrhea, sinus pain, sinus pressure, sneezing, sore throat, tinnitus, trouble swallowing and voice change.   Respiratory: Negative.   Cardiovascular: Negative.   Psychiatric/Behavioral: Negative.     Per HPI unless specifically indicated above     Objective:    BP 121/74 (BP Location: Left Arm, Patient Position: Sitting, Cuff Size: Normal)   Pulse 64   Temp 97.9 F (36.6 C)   SpO2 99%   Wt Readings from Last 3 Encounters:  02/27/17 175 lb (79.4 kg)  12/20/16 174 lb (78.9 kg)  10/29/16 179 lb (81.2 kg)    Physical Exam    Constitutional: He is oriented to person, place, and time. He appears well-developed and well-nourished. No distress.  HENT:  Head: Normocephalic and atraumatic.  Right Ear: There is drainage. Decreased hearing is noted.  Left Ear: There is drainage. Decreased hearing is noted.  Nose: Nose normal.  Lump in R EAC, hole in L EAC, very wet, no pus today, significant scarring on both TMs. No wax  Eyes: Conjunctivae and lids are normal. Right eye exhibits no discharge. Left eye exhibits no discharge. No scleral icterus.  Cardiovascular: Normal rate, regular rhythm, normal heart sounds and intact distal pulses.  Exam reveals no gallop and no friction rub.   No murmur heard. Pulmonary/Chest: Effort normal and breath sounds normal. No respiratory distress. He has no wheezes. He has no rales. He exhibits no tenderness.  Musculoskeletal: Normal range of motion.  Neurological: He is alert and oriented to person, place, and time.  Skin: Skin is warm, dry and intact. No rash noted. He is not diaphoretic. No erythema. No pallor.  Psychiatric: He has a normal mood and affect. His speech is normal and behavior is normal. Judgment and thought content normal. Cognition and memory are normal.  Nursing note and vitals reviewed.   Results for orders placed or performed during the hospital encounter of 02/26/17  CBC  Result Value Ref Range   WBC 9.3 3.8 - 10.6 K/uL   RBC 4.61 4.40 - 5.90 MIL/uL   Hemoglobin 13.6 13.0 - 18.0 g/dL   HCT 39.0 (L) 40.0 - 52.0 %  MCV 84.7 80.0 - 100.0 fL   MCH 29.6 26.0 - 34.0 pg   MCHC 35.0 32.0 - 36.0 g/dL   RDW 13.5 11.5 - 14.5 %   Platelets 262 150 - 440 K/uL  Comprehensive metabolic panel  Result Value Ref Range   Sodium 139 135 - 145 mmol/L   Potassium 3.5 3.5 - 5.1 mmol/L   Chloride 103 101 - 111 mmol/L   CO2 29 22 - 32 mmol/L   Glucose, Bld 113 (H) 65 - 99 mg/dL   BUN 13 6 - 20 mg/dL   Creatinine, Ser 0.93 0.61 - 1.24 mg/dL   Calcium 9.0 8.9 - 10.3 mg/dL    Total Protein 7.3 6.5 - 8.1 g/dL   Albumin 4.4 3.5 - 5.0 g/dL   AST 19 15 - 41 U/L   ALT 12 (L) 17 - 63 U/L   Alkaline Phosphatase 66 38 - 126 U/L   Total Bilirubin 0.8 0.3 - 1.2 mg/dL   GFR calc non Af Amer >60 >60 mL/min   GFR calc Af Amer >60 >60 mL/min   Anion gap 7 5 - 15  Troponin I  Result Value Ref Range   Troponin I <0.03 <0.03 ng/mL      Assessment & Plan:   Problem List Items Addressed This Visit      Nervous and Auditory   Hearing loss - Primary    Would like another opinion.  Referral to [facility] ENT made today. Await appointment. Ears clear today, no sign of any wax.       Relevant Orders   Ambulatory referral to ENT     Other   History of sinus cancer    Will get him into a different ENT. Referral generated today to see [facility], however they are out of network. Referral to [facility] ENT generated today.       Relevant Orders   Ambulatory referral to ENT    Other Visit Diagnoses    Otorrhea of both ears       Referral to ENT made today. Appointment scheduled   Relevant Orders   Ambulatory referral to ENT       Follow up plan: Return September, for 6 month follow up.

## 2017-05-27 DIAGNOSIS — H66002 Acute suppurative otitis media without spontaneous rupture of ear drum, left ear: Secondary | ICD-10-CM | POA: Insufficient documentation

## 2017-06-22 ENCOUNTER — Ambulatory Visit (INDEPENDENT_AMBULATORY_CARE_PROVIDER_SITE_OTHER): Payer: Medicare Other | Admitting: Family Medicine

## 2017-06-22 ENCOUNTER — Encounter: Payer: Self-pay | Admitting: Family Medicine

## 2017-06-22 VITALS — BP 124/82 | HR 67 | Temp 97.6°F | Wt 172.4 lb

## 2017-06-22 DIAGNOSIS — E782 Mixed hyperlipidemia: Secondary | ICD-10-CM

## 2017-06-22 DIAGNOSIS — E079 Disorder of thyroid, unspecified: Secondary | ICD-10-CM | POA: Diagnosis not present

## 2017-06-22 DIAGNOSIS — Z23 Encounter for immunization: Secondary | ICD-10-CM | POA: Diagnosis not present

## 2017-06-22 DIAGNOSIS — I10 Essential (primary) hypertension: Secondary | ICD-10-CM | POA: Diagnosis not present

## 2017-06-22 DIAGNOSIS — H938X1 Other specified disorders of right ear: Secondary | ICD-10-CM

## 2017-06-22 DIAGNOSIS — H7091 Unspecified mastoiditis, right ear: Secondary | ICD-10-CM

## 2017-06-22 MED ORDER — CIPROFLOXACIN-DEXAMETHASONE 0.3-0.1 % OT SUSP: 4.0000 [drp] | Freq: Two times a day (BID) | OTIC | 0 refills | Status: DC

## 2017-06-22 NOTE — Patient Instructions (Signed)
[facility] ENT 07/14/2017 3:45PM

## 2017-06-22 NOTE — Assessment & Plan Note (Signed)
According to notes, this appears to be chronic, unclear what ENT's plans were for it, unclear by chart review going back over a year what plan is. No tenderness to palpation of his mastoid today. Will get back into see [facility] ENT- appointment scheduled for 07/14/2017 3:45PM- first available. Will treat drainage with ciprodex- Rx sent to his pharmacy.

## 2017-06-22 NOTE — Assessment & Plan Note (Signed)
Under good control. Refills given. Continue current regimen. Continue to monitor.

## 2017-06-22 NOTE — Assessment & Plan Note (Signed)
Stable. Rechecking levels today. Await results. Call with any concerns.  

## 2017-06-22 NOTE — Progress Notes (Signed)
BP 124/82 (BP Location: Left Arm, Patient Position: Sitting, Cuff Size: Normal)   Pulse 67   Temp 97.6 F (36.4 C)   Wt 172 lb 6 oz (78.2 kg)   SpO2 100%   BMI 22.74 kg/m    Subjective:    Patient ID: [name], male    DOB: [birth year], 61 y.o.   MRN: [id number]  HPI: Marcus Wright is a 61 y.o. male  Chief Complaint  Patient presents with  . Hypertension   Patient returns today for regular follow up on his BP and cholesterol. Very concerned about his ear as it has been draining. He stated last visit that he was not happy with his care at [facility] and wanted to see someone else. We attempted to get him into see [facility], but they were not able to see him due to insurance. We referred him to [facility] ENT for a 2nd opinion. He went to see [facility] and they strongly recommended getting him back into [facility]  ENT notes from [facility] reviewed- he had a R ME mass that was likely inflammatory, but they wanted to get further imaging and then possibly biopsy it. He had a biopsy done in May. He was supposed to have surgery in June, however this was canceled. Per review of Care Everywhere, it is unclear what the plan was, and he has not been back to see them since then. Had a CT of his head done on 02/21/17 which showed: There is slight increase in the soft tissue thickening of the right EAC with similar appearance of erosion of the inferior aspect of the osseous EAC compared to the prior CT.  --Nearly completely opacified right middle ear, erosion of the scutum and poor visualization of the right ossicular chain is similar to prior. -- Soft tissue lesion in the inferior aspect of the left EAC with an embedded myringotomy tube and a slight interval increase in the amount of adjacent osseous erosions, possibly representing cholesteatoma. --Partial ossicular chain prosthesis, left middle ear and inner ear structures are unchanged from prior.  Call made to [facility]- they state that patient refused follow up  appointment with them for post-op and didn't want to reschedule an appointment with them  Saw ENT in [place] on 05/27/17- their note reviewed and they recommended that he get back into [facility] for treatment of his ear.   HYPERTENSION / HYPERLIPIDEMIA Satisfied with current treatment? yes Duration of hypertension: chronic BP monitoring frequency: not checking BP medication side effects: no Duration of hyperlipidemia: chronic Cholesterol medication side effects: Not on anything Cholesterol supplements: none Past cholesterol medications: none Medication compliance: excellent compliance Aspirin: no Recent stressors: no Recurrent headaches: no Visual changes: no Palpitations: no Dyspnea: no Chest pain: no Lower extremity edema: no Dizzy/lightheaded: no  HYPOTHYROIDISM Thyroid control status:controlled Satisfied with current treatment? yes Medication side effects: no Medication compliance: excellent compliance Recent dose adjustment:no Fatigue: no Cold intolerance: no Heat intolerance: no Weight gain: no Weight loss: no Constipation: no Diarrhea/loose stools: no Palpitations: no Lower extremity edema: no Anxiety/depressed mood: no  Relevant past medical, surgical, family and social history reviewed and updated as indicated. Interim medical history since our last visit reviewed. Allergies and medications reviewed and updated.  Review of Systems  Constitutional: Negative.   HENT: Positive for ear discharge and hearing loss. Negative for congestion, dental problem, drooling, ear pain, facial swelling, mouth sores, nosebleeds, postnasal drip, rhinorrhea, sinus pain, sinus pressure, sneezing, sore throat, tinnitus, trouble swallowing and voice change.  Respiratory: Negative.   Cardiovascular: Negative.   Psychiatric/Behavioral: Negative.     Per HPI unless specifically indicated above     Objective:    BP 124/82 (BP Location: Left Arm, Patient Position: Sitting, Cuff Size:  Normal)   Pulse 67   Temp 97.6 F (36.4 C)   Wt 172 lb 6 oz (78.2 kg)   SpO2 100%   BMI 22.74 kg/m   Wt Readings from Last 3 Encounters:  06/22/17 172 lb 6 oz (78.2 kg)  02/27/17 175 lb (79.4 kg)  12/20/16 174 lb (78.9 kg)    Physical Exam  Constitutional: He is oriented to person, place, and time. He appears well-developed and well-nourished. No distress.  HENT:  Head: Normocephalic and atraumatic.  Right Ear: Hearing normal.  Left Ear: Hearing normal.  Nose: Nose normal.  Mouth/Throat: Oropharynx is clear and moist. No oropharyngeal exudate.  Drainage out of R ear, no cerumen, perforated TM  Eyes: Pupils are equal, round, and reactive to light. Conjunctivae, EOM and lids are normal. Right eye exhibits no discharge. Left eye exhibits no discharge. No scleral icterus.  Cardiovascular: Normal rate, regular rhythm, normal heart sounds and intact distal pulses.  Exam reveals no gallop and no friction rub.   No murmur heard. Pulmonary/Chest: Effort normal and breath sounds normal. No respiratory distress. He has no wheezes. He has no rales. He exhibits no tenderness.  Musculoskeletal: Normal range of motion.  Neurological: He is alert and oriented to person, place, and time.  Skin: Skin is warm, dry and intact. No rash noted. He is not diaphoretic. No erythema. No pallor.  Psychiatric: He has a normal mood and affect. His speech is normal and behavior is normal. Judgment and thought content normal. Cognition and memory are normal.  Nursing note and vitals reviewed.   Results for orders placed or performed during the hospital encounter of 02/26/17  CBC  Result Value Ref Range   WBC 9.3 3.8 - 10.6 K/uL   RBC 4.61 4.40 - 5.90 MIL/uL   Hemoglobin 13.6 13.0 - 18.0 g/dL   HCT 39.0 (L) 40.0 - 52.0 %   MCV 84.7 80.0 - 100.0 fL   MCH 29.6 26.0 - 34.0 pg   MCHC 35.0 32.0 - 36.0 g/dL   RDW 13.5 11.5 - 14.5 %   Platelets 262 150 - 440 K/uL  Comprehensive metabolic panel  Result Value  Ref Range   Sodium 139 135 - 145 mmol/L   Potassium 3.5 3.5 - 5.1 mmol/L   Chloride 103 101 - 111 mmol/L   CO2 29 22 - 32 mmol/L   Glucose, Bld 113 (H) 65 - 99 mg/dL   BUN 13 6 - 20 mg/dL   Creatinine, Ser 0.93 0.61 - 1.24 mg/dL   Calcium 9.0 8.9 - 10.3 mg/dL   Total Protein 7.3 6.5 - 8.1 g/dL   Albumin 4.4 3.5 - 5.0 g/dL   AST 19 15 - 41 U/L   ALT 12 (L) 17 - 63 U/L   Alkaline Phosphatase 66 38 - 126 U/L   Total Bilirubin 0.8 0.3 - 1.2 mg/dL   GFR calc non Af Amer >60 >60 mL/min   GFR calc Af Amer >60 >60 mL/min   Anion gap 7 5 - 15  Troponin I  Result Value Ref Range   Troponin I <0.03 <0.03 ng/mL      Assessment & Plan:   Problem List Items Addressed This Visit      Cardiovascular and Mediastinum  Hypertension - Primary    Under good control. Refills given. Continue current regimen. Continue to monitor.       Relevant Orders   Comprehensive metabolic panel     Endocrine   Thyroid disorder    Stable. Rechecking levels today. Await results. Call with any concerns.       Relevant Orders   TSH     Musculoskeletal and Integument   Mastoiditis of right side    According to notes, this appears to be chronic, unclear what ENT's plans were for it, unclear by chart review going back over a year what plan is. No tenderness to palpation of his mastoid today. Will get back into see [facility] ENT- appointment scheduled for 07/14/2017 3:45PM- first available. Will treat drainage with ciprodex- Rx sent to his pharmacy.      Relevant Orders   Ambulatory referral to ENT     Other   Hyperlipidemia    Stable. Rechecking levels today. Await results. Call with any concerns.       Relevant Orders   Comprehensive metabolic panel   Lipid Panel w/o Chol/HDL Ratio    Other Visit Diagnoses    Mass of right ear       I am not able to see results of biopsy in Care Everywhere. Referral back to [facility]- appointment scheduled for 10/18 at 3:45   Relevant Orders   Ambulatory referral to ENT     Need for influenza vaccination       Flu shot given today.   Relevant Orders   Flu Vaccine QUAD 6+ mos PF IM (Fluarix Quad PF) (Completed)       Follow up plan: Return in about 2 months (around 08/22/2017) for follow up ear.

## 2017-06-23 LAB — COMPREHENSIVE METABOLIC PANEL
ALT: 16 IU/L (ref 0–44)
AST: 19 IU/L (ref 0–40)
Albumin/Globulin Ratio: 1.9 (ref 1.2–2.2)
Albumin: 4.5 g/dL (ref 3.6–4.8)
Alkaline Phosphatase: 64 IU/L (ref 39–117)
BUN/Creatinine Ratio: 10 (ref 10–24)
BUN: 10 mg/dL (ref 8–27)
Bilirubin Total: 0.3 mg/dL (ref 0.0–1.2)
CO2: 27 mmol/L (ref 20–29)
Calcium: 10 mg/dL (ref 8.6–10.2)
Chloride: 100 mmol/L (ref 96–106)
Creatinine, Ser: 0.97 mg/dL (ref 0.76–1.27)
GFR calc Af Amer: 96 mL/min/{1.73_m2} (ref >59)
GFR calc non Af Amer: 83 mL/min/{1.73_m2} (ref >59)
Globulin, Total: 2.4 g/dL (ref 1.5–4.5)
Glucose: 80 mg/dL (ref 65–99)
Potassium: 4.6 mmol/L (ref 3.5–5.2)
Sodium: 140 mmol/L (ref 134–144)
Total Protein: 6.9 g/dL (ref 6.0–8.5)

## 2017-06-23 LAB — LIPID PANEL W/O CHOL/HDL RATIO
Cholesterol, Total: 259 mg/dL — ABNORMAL HIGH (ref 100–199)
HDL: 39 mg/dL — ABNORMAL LOW (ref >39)
LDL Calculated: 173 mg/dL — ABNORMAL HIGH (ref 0–99)
Triglycerides: 236 mg/dL — ABNORMAL HIGH (ref 0–149)
VLDL Cholesterol Cal: 47 mg/dL — ABNORMAL HIGH (ref 5–40)

## 2017-06-23 LAB — TSH: TSH: 1.8 u[IU]/mL (ref 0.450–4.500)

## 2017-06-24 ENCOUNTER — Telehealth: Payer: Self-pay | Admitting: Family Medicine

## 2017-06-24 NOTE — Telephone Encounter (Signed)
Please let him know that his labs look good, but his cholesterol is still really high. He has a 16.1% risk of having a cardiac event with that cholesterol, so I recommend that we start him on some medicine. If he's OK with that, let me know and I'll send him through some medicine to his pharmacy. Thanks!

## 2017-06-24 NOTE — Telephone Encounter (Signed)
Called and left a message for patient to return my call.  

## 2017-06-27 MED ORDER — ATORVASTATIN CALCIUM 40 MG PO TABS
40.0000 mg | ORAL_TABLET | Freq: Every day | ORAL | 1 refills | Status: DC
Start: 2017-06-27 — End: 2017-08-23

## 2017-06-27 NOTE — Telephone Encounter (Signed)
Patient's wife notified, she said to go ahead and send it, she will let him know.

## 2017-08-22 ENCOUNTER — Encounter: Payer: Self-pay | Admitting: Family Medicine

## 2017-08-22 ENCOUNTER — Ambulatory Visit: Payer: Medicare Other | Admitting: Family Medicine

## 2017-08-22 VITALS — BP 110/74 | HR 70 | Temp 97.9°F | Wt 179.3 lb

## 2017-08-22 DIAGNOSIS — H7091 Unspecified mastoiditis, right ear: Secondary | ICD-10-CM | POA: Diagnosis not present

## 2017-08-22 DIAGNOSIS — M67449 Ganglion, unspecified hand: Secondary | ICD-10-CM

## 2017-08-22 DIAGNOSIS — E782 Mixed hyperlipidemia: Secondary | ICD-10-CM | POA: Diagnosis not present

## 2017-08-22 MED ORDER — CIPROFLOXACIN-DEXAMETHASONE 0.3-0.1 % OT SUSP: 4.0000 [drp] | Freq: Two times a day (BID) | OTIC | 0 refills | Status: DC

## 2017-08-22 NOTE — Assessment & Plan Note (Signed)
Letter to ENT generated to try to confirm treatment plan. Refill of ciprodex given today. Call with any concern.

## 2017-08-22 NOTE — Assessment & Plan Note (Signed)
No side effects from the medication- rechecking levels today. Continue current regimen. Continue to monitor. Refills given today. Call with any concerns.

## 2017-08-22 NOTE — Progress Notes (Signed)
BP 110/74 (BP Location: Left Arm, Patient Position: Sitting, Cuff Size: Normal)   Pulse 70   Temp 97.9 F (36.6 C)   Wt 179 lb 5 oz (81.3 kg)   SpO2 100%   BMI 23.66 kg/m    Subjective:    Patient ID: Marcus Wright, male    DOB: 11/04/1955, 61 y.o.   MRN: [id number]  HPI: Marcus Wright is a 61 y.o. male  Chief Complaint  Patient presents with  . Ear Problem    follow up  . Hyperlipidemia   Notes from ENT at [facility] reviewed which state: "bilateral severe to profound mixed hearing loss, bilateral ORN, and right ME granulation/polyps that causes drainage. The latter is likely inflammatory, we biopsied it and is benign. He has elected to not undergo any surgery for either COM on the right, or hearing loss bilaterally." Per their note- they will continue medical management. Unclear per this note what that entails, difficult to understand from patient given his profound hearing loss. Unclear when he is following up with him again.   HYPERLIPIDEMIA- started on atorvastatin last visit Hyperlipidemia status: excellent compliance Satisfied with current treatment?  yes Side effects:  no Medication compliance: excellent compliance Past cholesterol meds: atorvastain (lipitor) Supplements: none Aspirin:  no The 10-year ASCVD risk score Mikey Bussing DC Jr., et al., 2013) is: 14%   Values used to calculate the score:     Age: 40 years     Sex: Male     Is Non-Hispanic African American: No     Diabetic: No     Tobacco smoker: No     Systolic Blood Pressure: 106 mmHg     Is BP treated: Yes     HDL Cholesterol: 39 mg/dL     Total Cholesterol: 259 mg/dL Chest pain:  no Coronary artery disease:  no  Has a ganglion cyst on his L thumb that's bothering him when he rakes leaves or uses his hand. Would like it removed.  Relevant past medical, surgical, family and social history reviewed and updated as indicated. Interim medical history since our last visit reviewed. Allergies and medications  reviewed and updated.  Review of Systems  Constitutional: Negative.   HENT: Positive for ear discharge and hearing loss. Negative for congestion, dental problem, drooling, ear pain, facial swelling, mouth sores, nosebleeds, postnasal drip, rhinorrhea, sinus pressure, sinus pain, sneezing, sore throat, tinnitus, trouble swallowing and voice change.   Respiratory: Negative.   Cardiovascular: Negative.   Psychiatric/Behavioral: Negative.     Per HPI unless specifically indicated above     Objective:    BP 110/74 (BP Location: Left Arm, Patient Position: Sitting, Cuff Size: Normal)   Pulse 70   Temp 97.9 F (36.6 C)   Wt 179 lb 5 oz (81.3 kg)   SpO2 100%   BMI 23.66 kg/m   Wt Readings from Last 3 Encounters:  08/22/17 179 lb 5 oz (81.3 kg)  06/22/17 172 lb 6 oz (78.2 kg)  02/27/17 175 lb (79.4 kg)    Physical Exam  Constitutional: He is oriented to person, place, and time. He appears well-developed and well-nourished. No distress.  HENT:  Head: Normocephalic and atraumatic.  Right Ear: Hearing normal.  Left Ear: Hearing normal.  Nose: Nose normal.  Profoundly hard of hearing   Eyes: Conjunctivae and lids are normal. Right eye exhibits no discharge. Left eye exhibits no discharge. No scleral icterus.  Cardiovascular: Normal rate, regular rhythm, normal heart sounds and intact distal pulses.  Exam reveals no gallop and no friction rub.  No murmur heard. Pulmonary/Chest: Effort normal and breath sounds normal. No respiratory distress. He has no wheezes. He has no rales. He exhibits no tenderness.  Musculoskeletal: Normal range of motion.  Ganglion cyst on the flexor surface of L thumb  Neurological: He is alert and oriented to person, place, and time.  Skin: Skin is warm, dry and intact. No rash noted. He is not diaphoretic. No erythema. No pallor.  Psychiatric: He has a normal mood and affect. His speech is normal and behavior is normal. Judgment and thought content normal.  Cognition and memory are normal.  Nursing note and vitals reviewed.   Results for orders placed or performed in visit on 06/22/17  Comprehensive metabolic panel  Result Value Ref Range   Glucose 80 65 - 99 mg/dL   BUN 10 8 - 27 mg/dL   Creatinine, Ser 0.97 0.76 - 1.27 mg/dL   GFR calc non Af Amer 83 >59 mL/min/1.73   GFR calc Af Amer 96 >59 mL/min/1.73   BUN/Creatinine Ratio 10 10 - 24   Sodium 140 134 - 144 mmol/L   Potassium 4.6 3.5 - 5.2 mmol/L   Chloride 100 96 - 106 mmol/L   CO2 27 20 - 29 mmol/L   Calcium 10.0 8.6 - 10.2 mg/dL   Total Protein 6.9 6.0 - 8.5 g/dL   Albumin 4.5 3.6 - 4.8 g/dL   Globulin, Total 2.4 1.5 - 4.5 g/dL   Albumin/Globulin Ratio 1.9 1.2 - 2.2   Bilirubin Total 0.3 0.0 - 1.2 mg/dL   Alkaline Phosphatase 64 39 - 117 IU/L   AST 19 0 - 40 IU/L   ALT 16 0 - 44 IU/L  Lipid Panel w/o Chol/HDL Ratio  Result Value Ref Range   Cholesterol, Total 259 (H) 100 - 199 mg/dL   Triglycerides 236 (H) 0 - 149 mg/dL   HDL 39 (L) >39 mg/dL   VLDL Cholesterol Cal 47 (H) 5 - 40 mg/dL   LDL Calculated 173 (H) 0 - 99 mg/dL  TSH  Result Value Ref Range   TSH 1.800 0.450 - 4.500 uIU/mL      Assessment & Plan:   Problem List Items Addressed This Visit      Musculoskeletal and Integument   Mastoiditis of right side    Letter to ENT generated to try to confirm treatment plan. Refill of ciprodex given today. Call with any concern.         Other   Hyperlipidemia - Primary    No side effects from the medication- rechecking levels today. Continue current regimen. Continue to monitor. Refills given today. Call with any concerns.       Relevant Orders   Lipid Panel w/o Chol/HDL Ratio   Comprehensive metabolic panel    Other Visit Diagnoses    Ganglion cyst of finger       Really bothering him. Will refer to hand specialist. Call with any concerns.    Relevant Orders   Ambulatory referral to Hand Surgery       Follow up plan: Return in about 3 months (around  11/22/2017) for Follow up.

## 2017-08-23 ENCOUNTER — Other Ambulatory Visit: Payer: Self-pay | Admitting: Family Medicine

## 2017-08-23 ENCOUNTER — Encounter: Payer: Self-pay | Admitting: Family Medicine

## 2017-08-23 LAB — COMPREHENSIVE METABOLIC PANEL
ALT: 17 IU/L (ref 0–44)
AST: 18 IU/L (ref 0–40)
Albumin/Globulin Ratio: 2.3 — ABNORMAL HIGH (ref 1.2–2.2)
Albumin: 4.5 g/dL (ref 3.6–4.8)
Alkaline Phosphatase: 69 IU/L (ref 39–117)
BUN/Creatinine Ratio: 12 (ref 10–24)
BUN: 10 mg/dL (ref 8–27)
Bilirubin Total: 0.3 mg/dL (ref 0.0–1.2)
CO2: 24 mmol/L (ref 20–29)
Calcium: 9.2 mg/dL (ref 8.6–10.2)
Chloride: 99 mmol/L (ref 96–106)
Creatinine, Ser: 0.84 mg/dL (ref 0.76–1.27)
GFR calc Af Amer: 108 mL/min/{1.73_m2} (ref >59)
GFR calc non Af Amer: 93 mL/min/{1.73_m2} (ref >59)
Globulin, Total: 2 g/dL (ref 1.5–4.5)
Glucose: 89 mg/dL (ref 65–99)
Potassium: 4.4 mmol/L (ref 3.5–5.2)
Sodium: 140 mmol/L (ref 134–144)
Total Protein: 6.5 g/dL (ref 6.0–8.5)

## 2017-08-23 LAB — LIPID PANEL W/O CHOL/HDL RATIO
Cholesterol, Total: 273 mg/dL — ABNORMAL HIGH (ref 100–199)
HDL: 34 mg/dL — ABNORMAL LOW (ref >39)
LDL Calculated: 166 mg/dL — ABNORMAL HIGH (ref 0–99)
Triglycerides: 365 mg/dL — ABNORMAL HIGH (ref 0–149)
VLDL Cholesterol Cal: 73 mg/dL — ABNORMAL HIGH (ref 5–40)

## 2017-08-23 MED ORDER — ATORVASTATIN CALCIUM 80 MG PO TABS: 80.0000 mg | ORAL_TABLET | Freq: Every day | ORAL | 1 refills | Status: DC

## 2017-08-25 ENCOUNTER — Other Ambulatory Visit: Payer: Self-pay | Admitting: Family Medicine

## 2017-11-22 ENCOUNTER — Encounter: Payer: Self-pay | Admitting: Family Medicine

## 2017-11-22 ENCOUNTER — Ambulatory Visit: Payer: Medicare Other | Admitting: Family Medicine

## 2017-11-22 VITALS — BP 115/77 | HR 76 | Temp 98.2°F | Wt 179.3 lb

## 2017-11-22 DIAGNOSIS — E782 Mixed hyperlipidemia: Secondary | ICD-10-CM | POA: Diagnosis not present

## 2017-11-22 DIAGNOSIS — I10 Essential (primary) hypertension: Secondary | ICD-10-CM | POA: Diagnosis not present

## 2017-11-22 MED ORDER — ATORVASTATIN CALCIUM 80 MG PO TABS: 80.0000 mg | ORAL_TABLET | Freq: Every day | ORAL | 1 refills | Status: DC

## 2017-11-22 MED ORDER — AMLODIPINE BESYLATE 2.5 MG PO TABS: 2.5000 mg | ORAL_TABLET | Freq: Every day | ORAL | 1 refills | Status: DC

## 2017-11-22 NOTE — Progress Notes (Signed)
BP 115/77 (BP Location: Left Arm, Patient Position: Sitting, Cuff Size: Normal)   Pulse 76   Temp 98.2 F (36.8 C)   Wt 179 lb 5 oz (81.3 kg)   SpO2 100%   BMI 23.66 kg/m    Subjective:    Patient ID: Marcus Wright, male    DOB: 1956/06/29, 62 y.o.   MRN: [id number]  HPI: Marcus Wright is a 62 y.o. male  Chief Complaint  Patient presents with  . Hyperlipidemia  . Hypertension   HYPERTENSION / [place] Satisfied with current treatment? yes Duration of hypertension: chronic BP monitoring frequency: not checking BP medication side effects: no Past BP meds: amlodipine Duration of hyperlipidemia: chronic Cholesterol medication side effects: no Cholesterol supplements: none Past cholesterol medications: atorvastatin Medication compliance: excellent compliance Aspirin: no Recent stressors: no Recurrent headaches: no Visual changes: no Palpitations: no Dyspnea: no Chest pain: no Lower extremity edema: no Dizzy/lightheaded: no   Relevant past medical, surgical, family and social history reviewed and updated as indicated. Interim medical history since our last visit reviewed. Allergies and medications reviewed and updated.  Review of Systems  Constitutional: Negative.   Respiratory: Negative.   Cardiovascular: Negative.   Psychiatric/Behavioral: Negative.     Per HPI unless specifically indicated above     Objective:    BP 115/77 (BP Location: Left Arm, Patient Position: Sitting, Cuff Size: Normal)   Pulse 76   Temp 98.2 F (36.8 C)   Wt 179 lb 5 oz (81.3 kg)   SpO2 100%   BMI 23.66 kg/m   Wt Readings from Last 3 Encounters:  11/22/17 179 lb 5 oz (81.3 kg)  08/22/17 179 lb 5 oz (81.3 kg)  06/22/17 172 lb 6 oz (78.2 kg)    Physical Exam  Constitutional: He is oriented to person, place, and time. He appears well-developed and well-nourished. No distress.  HENT:  Head: Normocephalic and atraumatic.  Right Ear: Hearing normal.  Left Ear:  Hearing normal.  Nose: Nose normal.  Eyes: Conjunctivae and lids are normal. Right eye exhibits no discharge. Left eye exhibits no discharge. No scleral icterus.  Cardiovascular: Normal rate, regular rhythm, normal heart sounds and intact distal pulses. Exam reveals no gallop and no friction rub.  No murmur heard. Pulmonary/Chest: Effort normal and breath sounds normal. No respiratory distress. He has no wheezes. He has no rales. He exhibits no tenderness.  Musculoskeletal: Normal range of motion.  Neurological: He is alert and oriented to person, place, and time.  Skin: Skin is warm, dry and intact. No rash noted. He is not diaphoretic. No erythema. No pallor.  Psychiatric: He has a normal mood and affect. His speech is normal and behavior is normal. Judgment and thought content normal. Cognition and memory are normal.  Nursing note and vitals reviewed.   Results for orders placed or performed in visit on 08/22/17  Lipid Panel w/o Chol/HDL Ratio  Result Value Ref Range   Cholesterol, Total 273 (H) 100 - 199 mg/dL   Triglycerides 365 (H) 0 - 149 mg/dL   HDL 34 (L) >39 mg/dL   VLDL Cholesterol Cal 73 (H) 5 - 40 mg/dL   LDL Calculated 166 (H) 0 - 99 mg/dL  Comprehensive metabolic panel  Result Value Ref Range   Glucose 89 65 - 99 mg/dL   BUN 10 8 - 27 mg/dL   Creatinine, Ser 0.84 0.76 - 1.27 mg/dL   GFR calc non Af Amer 93 >59 mL/min/1.73   GFR calc Af  Amer 108 >59 mL/min/1.73   BUN/Creatinine Ratio 12 10 - 24   Sodium 140 134 - 144 mmol/L   Potassium 4.4 3.5 - 5.2 mmol/L   Chloride 99 96 - 106 mmol/L   CO2 24 20 - 29 mmol/L   Calcium 9.2 8.6 - 10.2 mg/dL   Total Protein 6.5 6.0 - 8.5 g/dL   Albumin 4.5 3.6 - 4.8 g/dL   Globulin, Total 2.0 1.5 - 4.5 g/dL   Albumin/Globulin Ratio 2.3 (H) 1.2 - 2.2   Bilirubin Total 0.3 0.0 - 1.2 mg/dL   Alkaline Phosphatase 69 39 - 117 IU/L   AST 18 0 - 40 IU/L   ALT 17 0 - 44 IU/L      Assessment & Plan:   Problem List Items Addressed This  Visit      Cardiovascular and Mediastinum   Hypertension    Under good control. Continue current regimen. Continue to monitor. Call with any concerns.       Relevant Medications   atorvastatin (LIPITOR) 80 MG tablet   amLODipine (NORVASC) 2.5 MG tablet   Other Relevant Orders   Comprehensive metabolic panel     Other   Hyperlipidemia - Primary    Under good control. Continue current regimen. Continue to monitor. Call with any concerns.       Relevant Medications   atorvastatin (LIPITOR) 80 MG tablet   amLODipine (NORVASC) 2.5 MG tablet   Other Relevant Orders   Lipid Panel w/o Chol/HDL Ratio   Comprehensive metabolic panel       Follow up plan: Return in about 6 months (around 05/22/2018) for Wellness/physical.

## 2017-11-22 NOTE — Assessment & Plan Note (Signed)
Under good control. Continue current regimen. Continue to monitor. Call with any concerns. 

## 2017-11-23 ENCOUNTER — Encounter: Payer: Self-pay | Admitting: Family Medicine

## 2017-11-23 LAB — COMPREHENSIVE METABOLIC PANEL
ALT: 14 IU/L (ref 0–44)
AST: 13 IU/L (ref 0–40)
Albumin/Globulin Ratio: 2 (ref 1.2–2.2)
Albumin: 4.5 g/dL (ref 3.6–4.8)
Alkaline Phosphatase: 66 IU/L (ref 39–117)
BUN/Creatinine Ratio: 8 — ABNORMAL LOW (ref 10–24)
BUN: 8 mg/dL (ref 8–27)
Bilirubin Total: 0.3 mg/dL (ref 0.0–1.2)
CO2: 25 mmol/L (ref 20–29)
Calcium: 9.7 mg/dL (ref 8.6–10.2)
Chloride: 100 mmol/L (ref 96–106)
Creatinine, Ser: 1 mg/dL (ref 0.76–1.27)
GFR calc Af Amer: 92 mL/min/{1.73_m2} (ref >59)
GFR calc non Af Amer: 80 mL/min/{1.73_m2} (ref >59)
Globulin, Total: 2.2 g/dL (ref 1.5–4.5)
Glucose: 62 mg/dL — ABNORMAL LOW (ref 65–99)
Potassium: 4.5 mmol/L (ref 3.5–5.2)
Sodium: 140 mmol/L (ref 134–144)
Total Protein: 6.7 g/dL (ref 6.0–8.5)

## 2017-11-23 LAB — LIPID PANEL W/O CHOL/HDL RATIO
Cholesterol, Total: 257 mg/dL — ABNORMAL HIGH (ref 100–199)
HDL: 33 mg/dL — ABNORMAL LOW (ref >39)
LDL Calculated: 145 mg/dL — ABNORMAL HIGH (ref 0–99)
Triglycerides: 393 mg/dL — ABNORMAL HIGH (ref 0–149)
VLDL Cholesterol Cal: 79 mg/dL — ABNORMAL HIGH (ref 5–40)

## 2017-12-31 ENCOUNTER — Other Ambulatory Visit: Payer: Self-pay | Admitting: Family Medicine

## 2018-01-09 DIAGNOSIS — E039 Hypothyroidism, unspecified: Secondary | ICD-10-CM | POA: Diagnosis not present

## 2018-01-09 DIAGNOSIS — Z87891 Personal history of nicotine dependence: Secondary | ICD-10-CM | POA: Diagnosis not present

## 2018-01-09 DIAGNOSIS — M4050 Lordosis, unspecified, site unspecified: Secondary | ICD-10-CM | POA: Diagnosis not present

## 2018-01-09 DIAGNOSIS — M4682 Other specified inflammatory spondylopathies, cervical region: Secondary | ICD-10-CM | POA: Diagnosis not present

## 2018-01-09 DIAGNOSIS — M47814 Spondylosis without myelopathy or radiculopathy, thoracic region: Secondary | ICD-10-CM | POA: Diagnosis not present

## 2018-01-09 DIAGNOSIS — M4184 Other forms of scoliosis, thoracic region: Secondary | ICD-10-CM | POA: Diagnosis not present

## 2018-01-09 DIAGNOSIS — M47812 Spondylosis without myelopathy or radiculopathy, cervical region: Secondary | ICD-10-CM | POA: Diagnosis not present

## 2018-01-09 DIAGNOSIS — I10 Essential (primary) hypertension: Secondary | ICD-10-CM | POA: Diagnosis not present

## 2018-01-09 DIAGNOSIS — M5030 Other cervical disc degeneration, unspecified cervical region: Secondary | ICD-10-CM | POA: Diagnosis not present

## 2018-01-09 DIAGNOSIS — Z7951 Long term (current) use of inhaled steroids: Secondary | ICD-10-CM | POA: Diagnosis not present

## 2018-01-09 DIAGNOSIS — M5134 Other intervertebral disc degeneration, thoracic region: Secondary | ICD-10-CM | POA: Diagnosis not present

## 2018-01-09 DIAGNOSIS — Z981 Arthrodesis status: Secondary | ICD-10-CM | POA: Diagnosis not present

## 2018-01-09 DIAGNOSIS — Z79899 Other long term (current) drug therapy: Secondary | ICD-10-CM | POA: Diagnosis not present

## 2018-01-09 DIAGNOSIS — M419 Scoliosis, unspecified: Secondary | ICD-10-CM | POA: Diagnosis not present

## 2018-01-19 DIAGNOSIS — H6531 Chronic mucoid otitis media, right ear: Secondary | ICD-10-CM | POA: Diagnosis not present

## 2018-01-19 DIAGNOSIS — T162XXA Foreign body in left ear, initial encounter: Secondary | ICD-10-CM | POA: Diagnosis not present

## 2018-02-19 ENCOUNTER — Emergency Department
Admission: EM | Admit: 2018-02-19 | Discharge: 2018-02-19 | Disposition: A | Discharge status: Home / Self Care | Payer: Medicare Other | Attending: Emergency Medicine | Admitting: Emergency Medicine

## 2018-02-19 ENCOUNTER — Emergency Department: Payer: Medicare Other

## 2018-02-19 ENCOUNTER — Encounter: Payer: Self-pay | Admitting: Emergency Medicine

## 2018-02-19 DIAGNOSIS — W108XXA Fall (on) (from) other stairs and steps, initial encounter: Secondary | ICD-10-CM | POA: Diagnosis not present

## 2018-02-19 DIAGNOSIS — Y92008 Other place in unspecified non-institutional (private) residence as the place of occurrence of the external cause: Secondary | ICD-10-CM | POA: Insufficient documentation

## 2018-02-19 DIAGNOSIS — Z87891 Personal history of nicotine dependence: Secondary | ICD-10-CM | POA: Diagnosis not present

## 2018-02-19 DIAGNOSIS — I10 Essential (primary) hypertension: Secondary | ICD-10-CM | POA: Diagnosis not present

## 2018-02-19 DIAGNOSIS — Z79899 Other long term (current) drug therapy: Secondary | ICD-10-CM | POA: Diagnosis not present

## 2018-02-19 DIAGNOSIS — S93402A Sprain of unspecified ligament of left ankle, initial encounter: Secondary | ICD-10-CM

## 2018-02-19 DIAGNOSIS — Y939 Activity, unspecified: Secondary | ICD-10-CM | POA: Insufficient documentation

## 2018-02-19 DIAGNOSIS — S8992XA Unspecified injury of left lower leg, initial encounter: Secondary | ICD-10-CM | POA: Diagnosis not present

## 2018-02-19 DIAGNOSIS — Y92009 Unspecified place in unspecified non-institutional (private) residence as the place of occurrence of the external cause: Secondary | ICD-10-CM

## 2018-02-19 DIAGNOSIS — Y999 Unspecified external cause status: Secondary | ICD-10-CM | POA: Insufficient documentation

## 2018-02-19 DIAGNOSIS — M79605 Pain in left leg: Secondary | ICD-10-CM | POA: Diagnosis not present

## 2018-02-19 DIAGNOSIS — Z85820 Personal history of malignant melanoma of skin: Secondary | ICD-10-CM | POA: Diagnosis not present

## 2018-02-19 DIAGNOSIS — M25472 Effusion, left ankle: Secondary | ICD-10-CM | POA: Diagnosis not present

## 2018-02-19 DIAGNOSIS — W19XXXA Unspecified fall, initial encounter: Secondary | ICD-10-CM

## 2018-02-19 NOTE — ED Triage Notes (Signed)
Pt comes into the ED via POV c/o a fall from where he fell off his porch today.  Patient c/o left foot and leg pain.  Patient has no obvious deformity noted to it at this time.  Patient in NAD with even and unlabored respirations.

## 2018-02-19 NOTE — ED Provider Notes (Signed)
[facility] Emergency Department Provider Note ____________________________________________  Time seen: 1827  I have reviewed the triage vital signs and the nursing notes.  HISTORY  Chief Complaint  Fall  HPI Marcus Wright is a 62 y.o. male presents to the ED accompanied by his wife, for evaluation of injury sustained following a fall.  Patient describes he fell off his porch earlier today.  He apparently missed one of the steps coming down the deck. From that accident he sustained an injury to his left foot and left leg.  His primary concern is pain to the lateral ankle. He notes abrasions superficially, and tightness to the joint. He denies any head injury, chest pain, back pain, or lacerations.  Past Medical History:  Diagnosis Date  . Cancer [facility]) 2003   skin'  . Hypertension   . Thyroid disorder   . Wears dentures    full upper  . Wears hearing aid     Patient Active Problem List   Diagnosis Date Noted  . Mastoiditis of right side 06/22/2017  . Hearing loss 05/10/2017  . Colonic constipation   . Hyperlipidemia 05/21/2016  . History of sinus cancer   . Hypertension   . Thyroid disorder   . Cervical radicular pain 02/07/2014  . History of neck surgery 02/07/2014  . Gynecomastia, male 06/20/2013    Past Surgical History:  Procedure Laterality Date  . BACK SURGERY  2011  . BREAST SURGERY Right 11-22-12  . CHOLECYSTECTOMY    . COLONOSCOPY    . COLONOSCOPY WITH PROPOFOL N/A 07/19/2016   Procedure: COLONOSCOPY WITH PROPOFOL;  Surgeon: Lucilla Lame, MD;  Location: [place];  Service: Endoscopy;  Laterality: N/A;  . REPLACEMENT TOTAL KNEE Right 1993  . SINUS SURGERY WITH INSTATRAK  2003   cancer    Prior to Admission medications   Medication Sig Start Date End Date Taking? Authorizing Provider  amLODipine (NORVASC) 2.5 MG tablet Take 1 tablet (2.5 mg total) by mouth daily. 11/22/17   Johnson, Megan P, DO  atorvastatin (LIPITOR) 40  MG tablet TAKE 1 TABLET BY MOUTH EVERY DAY 12/31/17   Johnson, Megan P, DO  atorvastatin (LIPITOR) 80 MG tablet Take 1 tablet (80 mg total) by mouth daily. This is the new higher dose, take 2 of the 40mg  until you run out of that, then start this one 11/22/17   Park Liter P, DO  levothyroxine (SYNTHROID, LEVOTHROID) 50 MCG tablet TAKE 1 TABLET (50 MCG TOTAL) BY MOUTH DAILY. 12/31/17   Park Liter P, DO    Allergies Doxazosin  No family history on file.  Social History Social History   Tobacco Use  . Smoking status: Former Research scientist (life sciences)  . Smokeless tobacco: Never Used  . Tobacco comment: quit 20+ yrs ago  Substance Use Topics  . Alcohol use: Yes    Alcohol/week: 3.6 oz    Types: 6 Cans of beer per week    Comment: on occasion  . Drug use: No    Review of Systems  Constitutional: Negative for fever. Cardiovascular: Negative for chest pain. Respiratory: Negative for shortness of breath. Gastrointestinal: Negative for abdominal pain, vomiting and diarrhea. Musculoskeletal: Negative for back pain. Left ankle pain as above.  Skin: Negative for rash. Neurological: Negative for headaches, focal weakness or numbness. ____________________________________________  PHYSICAL EXAM:  VITAL SIGNS: ED Triage Vitals [02/19/18 1806]  Enc Vitals Group     BP 129/90     Pulse Rate 67     Resp 18  Temp 98 F (36.7 C)     Temp Source Oral     SpO2 100 %     Weight 175 lb (79.4 kg)     Height 6\' 1"  (1.854 m)     Head Circumference      Peak Flow      Pain Score 10     Pain Loc      Pain Edu?      Excl. in [place]?     Constitutional: Alert and oriented. Well appearing and in no distress. Head: Normocephalic and atraumatic. Eyes: Conjunctivae are normal. Normal extraocular movements Ears: hearing aids noted bilaterally Cardiovascular: Normal rate, regular rhythm. Normal distal pulses and capillary refill. Respiratory: Normal respiratory effort. Musculoskeletal: Left ankle with  swelling noted laterally.  Is also small overlying superficial abrasion to the lateral malleolus.  Patient is able to demonstrate normal ankle flexion extension range.  No calf or Achilles tenderness is noted.  Nontender with normal range of motion in all extremities.  Neurologic: Antalgic gait without ataxia. Normal speech and language. No gross focal neurologic deficits are appreciated. Skin:  Skin is warm, dry and intact. No rash noted. ____________________________________________   RADIOLOGY  Left Tibia/Fibula negative  Left Foot negative ____________________________________________  PROCEDURES  Procedures Ace wrap ____________________________________________  INITIAL IMPRESSION / ASSESSMENT AND PLAN / ED COURSE  Patient with ED evaluation of injury sustained following a mechanical fall at home.  Patient missed a step on his back deck, and sustained an ankle injury.  His exam and x-ray are negative for any acute fracture or dislocation.  He will be placed in an Ace wrap for support.  He will dose over-the-counter ibuprofen or Tylenol as needed for pain relief.  He may see his primary provider or Dr. Vickki Muff in podiatry for ongoing symptom management. ____________________________________________  FINAL CLINICAL IMPRESSION(S) / ED DIAGNOSES  Final diagnoses:  Fall in home, initial encounter  Sprain of left ankle, unspecified ligament, initial encounter      Melvenia Needles, PA-C 02/19/18 Consuello Closs, MD 02/19/18 2314

## 2018-02-19 NOTE — Discharge Instructions (Signed)
Your exam and x-ray are negative for fracture or dislocation. You have an ankle sprain and some abrasion. Wear the ace bandage for support. Rest with the leg elevated and apply ice to reduce swelling. See Dr. Vickki Muff in podiatry, for ongoing symptoms.

## 2018-02-19 NOTE — ED Notes (Addendum)
FIRST NURSE NOTE:  Pt HOH, c/o left foot pain from fall

## 2018-02-21 ENCOUNTER — Other Ambulatory Visit: Payer: Self-pay

## 2018-02-21 NOTE — Progress Notes (Signed)
This encounter was created in error - please disregard.

## 2018-02-21 NOTE — Addendum Note (Signed)
Addended byCalla Kicks T on: 02/21/2018 10:42 AM   Modules accepted: Level of Service, SmartSet

## 2018-02-23 ENCOUNTER — Telehealth: Payer: Self-pay

## 2018-02-23 NOTE — Telephone Encounter (Signed)
Copied from [place] [phone]. Topic: Medicare AWV >> Feb 23, 2018  2:52 PM [place], [place] A, LPN wrote: Reason for CRM: Called to schedule medicare annual wellness visit with NHA- Juaquina Machnik,LPN at [facility]. Please schedule anytime. Any questions please contact Joanette Gula on skype or by phone- [phone]

## 2018-05-17 DIAGNOSIS — H6060 Unspecified chronic otitis externa, unspecified ear: Secondary | ICD-10-CM | POA: Diagnosis not present

## 2018-05-17 DIAGNOSIS — H6123 Impacted cerumen, bilateral: Secondary | ICD-10-CM | POA: Diagnosis not present

## 2018-05-17 DIAGNOSIS — H7440 Polyp of middle ear, unspecified ear: Secondary | ICD-10-CM | POA: Diagnosis not present

## 2018-05-22 ENCOUNTER — Encounter: Payer: Self-pay | Admitting: Family Medicine

## 2018-05-22 ENCOUNTER — Ambulatory Visit (INDEPENDENT_AMBULATORY_CARE_PROVIDER_SITE_OTHER): Payer: Medicare Other | Admitting: Family Medicine

## 2018-05-22 VITALS — BP 113/74 | HR 71 | Temp 97.6°F | Wt 182.1 lb

## 2018-05-22 DIAGNOSIS — I10 Essential (primary) hypertension: Secondary | ICD-10-CM

## 2018-05-22 DIAGNOSIS — E782 Mixed hyperlipidemia: Secondary | ICD-10-CM

## 2018-05-22 DIAGNOSIS — Z0000 Encounter for general adult medical examination without abnormal findings: Secondary | ICD-10-CM

## 2018-05-22 DIAGNOSIS — E079 Disorder of thyroid, unspecified: Secondary | ICD-10-CM

## 2018-05-22 DIAGNOSIS — Z125 Encounter for screening for malignant neoplasm of prostate: Secondary | ICD-10-CM

## 2018-05-22 DIAGNOSIS — Z23 Encounter for immunization: Secondary | ICD-10-CM

## 2018-05-22 LAB — UA/M W/RFLX CULTURE, ROUTINE
Bilirubin, UA: NEGATIVE
Glucose, UA: NEGATIVE
Ketones, UA: NEGATIVE
Leukocytes, UA: NEGATIVE
Nitrite, UA: NEGATIVE
Protein, UA: NEGATIVE
RBC, UA: NEGATIVE
Specific Gravity, UA: 1.01 (ref 1.005–1.030)
Urobilinogen, Ur: 0.2 mg/dL (ref 0.2–1.0)
pH, UA: 7 (ref 5.0–7.5)

## 2018-05-22 MED ORDER — AMLODIPINE BESYLATE 2.5 MG PO TABS: 2.5000 mg | ORAL_TABLET | Freq: Every day | ORAL | 1 refills | Status: DC

## 2018-05-22 MED ORDER — ATORVASTATIN CALCIUM 80 MG PO TABS: 80.0000 mg | ORAL_TABLET | Freq: Every day | ORAL | 1 refills | Status: DC

## 2018-05-22 NOTE — Progress Notes (Signed)
BP 113/74 (BP Location: Left Arm, Patient Position: Sitting, Cuff Size: Normal)   Pulse 71   Temp 97.6 F (36.4 C)   Wt 182 lb 1 oz (82.6 kg)   SpO2 98%   BMI 24.02 kg/m    Subjective:    Patient ID: Marcus Wright, male    DOB: [birth year], 62 y.o.   MRN: [id number]  HPI: Marcus Wright is a 62 y.o. male presenting on 05/22/2018 for comprehensive medical examination. Current medical complaints include:  HYPERTENSION / HYPERLIPIDEMIA Satisfied with current treatment? yes Duration of hypertension: chronic BP monitoring frequency: not checking BP range:  BP medication side effects: no Past BP meds: amlodipine Duration of hyperlipidemia: chronic Cholesterol medication side effects: no Cholesterol supplements: none Past cholesterol medications: atorvastatin Medication compliance: excellent compliance Aspirin: no Recent stressors: no Recurrent headaches: no Visual changes: no Palpitations: no Dyspnea: no Chest pain: no Lower extremity edema: no Dizzy/lightheaded: no  HYPOTHYROIDISM Thyroid control status:controlled Satisfied with current treatment? yes Medication side effects: no Medication compliance: excellent compliance Recent dose adjustment:no Fatigue: no Cold intolerance: no Heat intolerance: no Weight gain: no Weight loss: no Constipation: no Diarrhea/loose stools: no Palpitations: no Lower extremity edema: no Anxiety/depressed mood: no  He currently lives with: wife Interim Problems from his last visit: no  Functional Status Survey: Is the patient deaf or have difficulty hearing?: Yes Does the patient have difficulty seeing, even when wearing glasses/contacts?: No Does the patient have difficulty concentrating, remembering, or making decisions?: Yes Does the patient have difficulty walking or climbing stairs?: No Does the patient have difficulty dressing or bathing?: No Does the patient have difficulty doing errands alone such as visiting a  doctor's office or shopping?: No  FALL RISK: Fall Risk  05/22/2018 12/20/2016  Falls in the past year? Yes No  Number falls in past yr: 2 or more -  Injury with Fall? Yes -    Depression Screen Depression screen [facility] 2/9 05/22/2018 12/20/2016  Decreased Interest 3 0  Down, Depressed, Hopeless 0 0  PHQ - 2 Score 3 0  Altered sleeping 1 -  Tired, decreased energy 0 -  Change in appetite 0 -  Feeling bad or failure about yourself  0 -  Trouble concentrating 0 -  Moving slowly or fidgety/restless 0 -  Suicidal thoughts 0 -  PHQ-9 Score 4 -  Difficult doing work/chores Not difficult at all -    Advanced Directives Information provided  Past Medical History:  Past Medical History:  Diagnosis Date  . Cancer [facility]) 2003   skin'  . Hypertension   . Thyroid disorder   . Wears dentures    full upper  . Wears hearing aid     Surgical History:  Past Surgical History:  Procedure Laterality Date  . BACK SURGERY  2011  . BREAST SURGERY Right 11-22-12  . CHOLECYSTECTOMY    . COLONOSCOPY    . COLONOSCOPY WITH PROPOFOL N/A 07/19/2016   Procedure: COLONOSCOPY WITH PROPOFOL;  Surgeon: Lucilla Lame, MD;  Location: [place];  Service: Endoscopy;  Laterality: N/A;  . REPLACEMENT TOTAL KNEE Right 1993  . SINUS SURGERY WITH INSTATRAK  2003   cancer    Medications:  Current Outpatient Medications on File Prior to Visit  Medication Sig  . levothyroxine (SYNTHROID, LEVOTHROID) 50 MCG tablet TAKE 1 TABLET (50 MCG TOTAL) BY MOUTH DAILY.  . Multiple Vitamin (MULTI-VITAMINS) TABS Take by mouth.   No current facility-administered medications on file prior to visit.  Allergies:  Allergies  Allergen Reactions  . Doxazosin Other (See Comments)    Not sure of reaction    Social History:  Social History   Socioeconomic History  . Marital status: Married    Spouse name: Not on file  . Number of children: Not on file  . Years of education: Not on file  . Highest education  level: Not on file  Occupational History  . Not on file  Social Needs  . Financial resource strain: Not on file  . Food insecurity:    Worry: Not on file    Inability: Not on file  . Transportation needs:    Medical: Not on file    Non-medical: Not on file  Tobacco Use  . Smoking status: Former Research scientist (life sciences)  . Smokeless tobacco: Never Used  . Tobacco comment: quit 20+ yrs ago  Substance and Sexual Activity  . Alcohol use: Yes    Alcohol/week: 6.0 standard drinks    Types: 6 Cans of beer per week    Comment: on occasion  . Drug use: No  . Sexual activity: Not on file  Lifestyle  . Physical activity:    Days per week: Not on file    Minutes per session: Not on file  . Stress: Not on file  Relationships  . Social connections:    Talks on phone: Not on file    Gets together: Not on file    Attends religious service: Not on file    Active member of club or organization: Not on file    Attends meetings of clubs or organizations: Not on file    Relationship status: Not on file  . Intimate partner violence:    Fear of current or ex partner: Not on file    Emotionally abused: Not on file    Physically abused: Not on file    Forced sexual activity: Not on file  Other Topics Concern  . Not on file  Social History Narrative  . Not on file   Social History   Tobacco Use  Smoking Status Former Smoker  Smokeless Tobacco Never Used  Tobacco Comment   quit 20+ yrs ago   Social History   Substance and Sexual Activity  Alcohol Use Yes  . Alcohol/week: 6.0 standard drinks  . Types: 6 Cans of beer per week   Comment: on occasion    Family History:  History reviewed. No pertinent family history.  Past medical history, surgical history, medications, allergies, family history and social history reviewed with patient today and changes made to appropriate areas of the chart.   Review of Systems  Constitutional: Negative.   HENT: Positive for ear discharge and hearing loss.  Negative for congestion, ear pain, nosebleeds, sinus pain, sore throat and tinnitus.   Eyes: Negative.   Respiratory: Negative.  Negative for stridor.   Cardiovascular: Negative.   Gastrointestinal: Negative.   Genitourinary: Negative.   Musculoskeletal: Positive for myalgias. Negative for back pain, falls, joint pain and neck pain.  Skin: Negative.  Negative for itching and rash.  Neurological: Positive for dizziness. Negative for tingling, tremors, sensory change, speech change, focal weakness, seizures, loss of consciousness, weakness and headaches.  Endo/Heme/Allergies: Negative.   Psychiatric/Behavioral: Negative.    All other ROS negative except what is listed above and in the HPI.      Objective:    BP 113/74 (BP Location: Left Arm, Patient Position: Sitting, Cuff Size: Normal)   Pulse 71   Temp 97.6 F (  36.4 C)   Wt 182 lb 1 oz (82.6 kg)   SpO2 98%   BMI 24.02 kg/m   Wt Readings from Last 3 Encounters:  05/22/18 182 lb 1 oz (82.6 kg)  02/19/18 175 lb (79.4 kg)  11/22/17 179 lb 5 oz (81.3 kg)    Physical Exam  Constitutional: He is oriented to person, place, and time. He appears well-developed and well-nourished. No distress.  HENT:  Head: Normocephalic and atraumatic.  Right Ear: External ear normal. Tympanic membrane is scarred. Decreased hearing is noted.  Left Ear: External ear normal. Tympanic membrane is scarred. Decreased hearing is noted.  Nose: Nose normal.  Mouth/Throat: Uvula is midline, oropharynx is clear and moist and mucous membranes are normal. No oropharyngeal exudate.  Eyes: Pupils are equal, round, and reactive to light. Conjunctivae, EOM and lids are normal. Right eye exhibits no discharge. Left eye exhibits no discharge. No scleral icterus.  Neck: Normal range of motion. Neck supple. No JVD present. No tracheal deviation present. No thyromegaly present.  Cardiovascular: Normal rate, regular rhythm, normal heart sounds and intact distal pulses. Exam  reveals no gallop and no friction rub.  No murmur heard. Pulmonary/Chest: Effort normal and breath sounds normal. No stridor. No respiratory distress. He has no wheezes. He has no rales. He exhibits no tenderness.  Abdominal: He exhibits no distension and no mass. There is no tenderness. There is no rebound and no guarding. No hernia.  Genitourinary:  Genitourinary Comments: Penis and prostate exam deferred with shared decision making  Musculoskeletal: Normal range of motion. He exhibits no edema, tenderness or deformity.  Lymphadenopathy:    He has no cervical adenopathy.  Neurological: He is alert and oriented to person, place, and time. He displays normal reflexes. No cranial nerve deficit or sensory deficit. He exhibits normal muscle tone. Coordination normal.  Skin: Skin is warm, dry and intact. Capillary refill takes less than 2 seconds. No rash noted. He is not diaphoretic. No erythema. No pallor.  Psychiatric: He has a normal mood and affect. His speech is normal and behavior is normal. Judgment and thought content normal. Cognition and memory are normal.  Nursing note and vitals reviewed.   6CIT Screen 05/22/2018 12/20/2016  What Year? 4 points 0 points  What month? 0 points 0 points  What time? 0 points 0 points  Count back from 20 0 points 0 points  Months in reverse 2 points 2 points  Repeat phrase 2 points 4 points  Total Score 8 6     Results for orders placed or performed in visit on 11/22/17  Lipid Panel w/o Chol/HDL Ratio  Result Value Ref Range   Cholesterol, Total 257 (H) 100 - 199 mg/dL   Triglycerides 393 (H) 0 - 149 mg/dL   HDL 33 (L) >39 mg/dL   VLDL Cholesterol Cal 79 (H) 5 - 40 mg/dL   LDL Calculated 145 (H) 0 - 99 mg/dL  Comprehensive metabolic panel  Result Value Ref Range   Glucose 62 (L) 65 - 99 mg/dL   BUN 8 8 - 27 mg/dL   Creatinine, Ser 1.00 0.76 - 1.27 mg/dL   GFR calc non Af Amer 80 >59 mL/min/1.73   GFR calc Af Amer 92 >59 mL/min/1.73    BUN/Creatinine Ratio 8 (L) 10 - 24   Sodium 140 134 - 144 mmol/L   Potassium 4.5 3.5 - 5.2 mmol/L   Chloride 100 96 - 106 mmol/L   CO2 25 20 - 29 mmol/L  Calcium 9.7 8.6 - 10.2 mg/dL   Total Protein 6.7 6.0 - 8.5 g/dL   Albumin 4.5 3.6 - 4.8 g/dL   Globulin, Total 2.2 1.5 - 4.5 g/dL   Albumin/Globulin Ratio 2.0 1.2 - 2.2   Bilirubin Total 0.3 0.0 - 1.2 mg/dL   Alkaline Phosphatase 66 39 - 117 IU/L   AST 13 0 - 40 IU/L   ALT 14 0 - 44 IU/L      Assessment & Plan:   Problem List Items Addressed This Visit      Cardiovascular and Mediastinum   Hypertension    Under good control. Continue current regimen. Continue to monitor. Call with any concerns. Refills given today.      Relevant Medications   amLODipine (NORVASC) 2.5 MG tablet   atorvastatin (LIPITOR) 80 MG tablet   Other Relevant Orders   Comprehensive metabolic panel   CBC with Differential/Platelet   UA/M w/rflx Culture, Routine     Endocrine   Thyroid disorder    Under good control. Continue current regimen. Continue to monitor. Call with any concerns. Refills given today.      Relevant Orders   Comprehensive metabolic panel   TSH   CBC with Differential/Platelet     Other   Hyperlipidemia    Under good control. Continue current regimen. Continue to monitor. Call with any concerns. Refills given today.      Relevant Medications   amLODipine (NORVASC) 2.5 MG tablet   atorvastatin (LIPITOR) 80 MG tablet   Other Relevant Orders   Comprehensive metabolic panel   Lipid Panel w/o Chol/HDL Ratio   CBC with Differential/Platelet    Other Visit Diagnoses    Medicare annual wellness visit, subsequent    -  Primary   Preventative care discussed today as below. Call with any concerns. Continue to monitor.    Routine general medical examination at a health care facility       Vaccines up to date. Labs drawn today. Await results. Continue diet and exercise. Call with any concerns. Colonoscopy up to date.     Screening for prostate cancer       Labs drawn today, await results.    Relevant Orders   PSA   Immunization due       Flu shot given today.   Relevant Orders   Flu Vaccine QUAD 6+ mos PF IM (Fluarix Quad PF) (Completed)       Preventative Services:  Health Risk Assessment and Personalized Prevention Plan: Done today Bone Mass Measurements: N/A CVD Screening: Done today Colon Cancer Screening: up to date Depression Screening: Done today Diabetes Screening: Done today Glaucoma Screening: See your eye doctor Hepatitis B vaccine: N/A Hepatitis C screening: Up to date HIV Screening: Up to date Flu Vaccine: Given today Lung cancer Screening: N/A Obesity Screening: Done today Pneumonia Vaccines (2): N/A STI Screening: N/A PSA screening: Drawn today.  LABORATORY TESTING:  Health maintenance labs ordered today as discussed above.   The natural history of prostate cancer and ongoing controversy regarding screening and potential treatment outcomes of prostate cancer has been discussed with the patient. The meaning of a false positive PSA and a false negative PSA has been discussed. He indicates understanding of the limitations of this screening test and wishes to proceed with screening PSA testing.   IMMUNIZATIONS:   - Tdap: Tetanus vaccination status reviewed: last tetanus booster within 10 years. - Influenza: Administered today - Pneumovax: Not applicable - Prevnar: Not applicable - Zostavax vaccine: Given  elsewhere  SCREENING: - Colonoscopy: Up to date  Discussed with patient purpose of the colonoscopy is to detect colon cancer at curable precancerous or early stages   PATIENT COUNSELING:    Sexuality: Discussed sexually transmitted diseases, partner selection, use of condoms, avoidance of unintended pregnancy  and contraceptive alternatives.   Advised to avoid cigarette smoking.  I discussed with the patient that most people either abstain from alcohol or drink within  safe limits (<=14/week and <=4 drinks/occasion for males, <=7/weeks and <= 3 drinks/occasion for females) and that the risk for alcohol disorders and other health effects rises proportionally with the number of drinks per week and how often a drinker exceeds daily limits.  Discussed cessation/primary prevention of drug use and availability of treatment for abuse.   Diet: Encouraged to adjust caloric intake to maintain  or achieve ideal body weight, to reduce intake of dietary saturated fat and total fat, to limit sodium intake by avoiding high sodium foods and not adding table salt, and to maintain adequate dietary potassium and calcium preferably from fresh fruits, vegetables, and low-fat dairy products.    stressed the importance of regular exercise  Injury prevention: Discussed safety belts, safety helmets, smoke detector, smoking near bedding or upholstery.   Dental health: Discussed importance of regular tooth brushing, flossing, and dental visits.   Follow up plan: NEXT PREVENTATIVE PHYSICAL DUE IN 1 YEAR. Return in about 6 months (around 11/22/2018) for 6 month follow up.

## 2018-05-22 NOTE — Assessment & Plan Note (Signed)
Under good control. Continue current regimen. Continue to monitor. Call with any concerns. Refills given today. 

## 2018-05-22 NOTE — Patient Instructions (Addendum)
Preventative Services:  Health Risk Assessment and Personalized Prevention Plan: Done today Bone Mass Measurements: N/A CVD Screening: Done today Colon Cancer Screening: up to date Depression Screening: Done today Diabetes Screening: Done today Glaucoma Screening: See your eye doctor Hepatitis B vaccine: N/A Hepatitis C screening: Up to date HIV Screening: Up to date Flu Vaccine: Given today Lung cancer Screening: N/A Obesity Screening: Done today Pneumonia Vaccines (2): N/A STI Screening: N/A PSA screening: Drawn today.   Health Maintenance, Male A healthy lifestyle and preventive care is important for your health and wellness. Ask your health care provider about what schedule of regular examinations is right for you. What should I know about weight and diet? Eat a Healthy Diet  Eat plenty of vegetables, fruits, whole grains, low-fat dairy products, and lean protein.  Do not eat a lot of foods high in solid fats, added sugars, or salt.  Maintain a Healthy Weight Regular exercise can help you achieve or maintain a healthy weight. You should:  Do at least 150 minutes of exercise each week. The exercise should increase your heart rate and make you sweat (moderate-intensity exercise).  Do strength-training exercises at least twice a week.  Watch Your Levels of Cholesterol and Blood Lipids  Have your blood tested for lipids and cholesterol every 5 years starting at 64 years of age. If you are at high risk for heart disease, you should start having your blood tested when you are 64 years old. You may need to have your cholesterol levels checked more often if: ? Your lipid or cholesterol levels are high. ? You are older than 64 years of age. ? You are at high risk for heart disease.  What should I know about cancer screening? Many types of cancers can be detected early and may often be prevented. Lung Cancer  You should be screened every year for lung cancer if: ? You are a  current smoker who has smoked for at least 30 years. ? You are a former smoker who has quit within the past 15 years.  Talk to your health care provider about your screening options, when you should start screening, and how often you should be screened.  Colorectal Cancer  Routine colorectal cancer screening usually begins at 64 years of age and should be repeated every 5-10 years until you are 64 years old. You may need to be screened more often if early forms of precancerous polyps or small growths are found. Your health care provider may recommend screening at an earlier age if you have risk factors for colon cancer.  Your health care provider may recommend using home test kits to check for hidden blood in the stool.  A small camera at the end of a tube can be used to examine your colon (sigmoidoscopy or colonoscopy). This checks for the earliest forms of colorectal cancer.  Prostate and Testicular Cancer  Depending on your age and overall health, your health care provider may do certain tests to screen for prostate and testicular cancer.  Talk to your health care provider about any symptoms or concerns you have about testicular or prostate cancer.  Skin Cancer  Check your skin from head to toe regularly.  Tell your health care provider about any new moles or changes in moles, especially if: ? There is a change in a mole's size, shape, or color. ? You have a mole that is larger than a pencil eraser.  Always use sunscreen. Apply sunscreen liberally and repeat throughout the  day.  Protect yourself by wearing long sleeves, pants, a wide-brimmed hat, and sunglasses when outside.  What should I know about heart disease, diabetes, and high blood pressure?  If you are 62-39 years of age, have your blood pressure checked every 3-5 years. If you are 25 years of age or older, have your blood pressure checked every year. You should have your blood pressure measured twice-once when you are at  a hospital or clinic, and once when you are not at a hospital or clinic. Record the average of the two measurements. To check your blood pressure when you are not at a hospital or clinic, you can use: ? An automated blood pressure machine at a pharmacy. ? A home blood pressure monitor.  Talk to your health care provider about your target blood pressure.  If you are between 48-70 years old, ask your health care provider if you should take aspirin to prevent heart disease.  Have regular diabetes screenings by checking your fasting blood sugar level. ? If you are at a normal weight and have a low risk for diabetes, have this test once every three years after the age of 63. ? If you are overweight and have a high risk for diabetes, consider being tested at a younger age or more often.  A one-time screening for abdominal aortic aneurysm (AAA) by ultrasound is recommended for men aged 32-75 years who are current or former smokers. What should I know about preventing infection? Hepatitis B If you have a higher risk for hepatitis B, you should be screened for this virus. Talk with your health care provider to find out if you are at risk for hepatitis B infection. Hepatitis C Blood testing is recommended for:  Everyone born from [birth year] through 1967.  Anyone with known risk factors for hepatitis C.  Sexually Transmitted Diseases (STDs)  You should be screened each year for STDs including gonorrhea and chlamydia if: ? You are sexually active and are younger than 64 years of age. ? You are older than 64 years of age and your health care provider tells you that you are at risk for this type of infection. ? Your sexual activity has changed since you were last screened and you are at an increased risk for chlamydia or gonorrhea. Ask your health care provider if you are at risk.  Talk with your health care provider about whether you are at high risk of being infected with HIV. Your health care provider  may recommend a prescription medicine to help prevent HIV infection.  What else can I do?  Schedule regular health, dental, and eye exams.  Stay current with your vaccines (immunizations).  Do not use any tobacco products, such as cigarettes, chewing tobacco, and e-cigarettes. If you need help quitting, ask your health care provider.  Limit alcohol intake to no more than 2 drinks per day. One drink equals 12 ounces of beer, 5 ounces of wine, or 1 ounces of hard liquor.  Do not use street drugs.  Do not share needles.  Ask your health care provider for help if you need support or information about quitting drugs.  Tell your health care provider if you often feel depressed.  Tell your health care provider if you have ever been abused or do not feel safe at home. This information is not intended to replace advice given to you by your health care provider. Make sure you discuss any questions you have with your health care provider. Document Released:  03/11/2008 Document Revised: 05/12/2016 Document Reviewed: 06/17/2015 Elsevier Interactive Patient Education  [employer].

## 2018-05-22 NOTE — Progress Notes (Deleted)
There were no vitals taken for this visit.   Subjective:    Patient ID: Marcus Wright, male    DOB: 1956/07/24, 62 y.o.   MRN: [id number]  HPI: Marcus Wright is a 62 y.o. male  No chief complaint on file.  HYPERTENSION / HYPERLIPIDEMIA Satisfied with current treatment? {Blank single:19197::"yes","no"} Duration of hypertension: {Blank single:19197::"chronic","months","years"} BP monitoring frequency: {Blank single:19197::"not checking","rarely","daily","weekly","monthly","a few times a day","a few times a week","a few times a month"} BP range:  BP medication side effects: {Blank single:19197::"yes","no"} Past BP meds: {Blank XBMWUXLK:44010::"UVOZ","DGUYQIHKVQ","QVZDGLOVFI/EPPIRJJOAC","ZYSAYTKZ","SWFUXNATFT","DDUKGURKYH/CWCB","JSEGBTDVVO (bystolic)","carvedilol","chlorthalidone","clonidine","diltiazem","exforge HCT","HCTZ","irbesartan (avapro)","labetalol","lisinopril","lisinopril-HCTZ","losartan (cozaar)","methyldopa","nifedipine","olmesartan (benicar)","olmesartan-HCTZ","quinapril","ramipril","spironalactone","tekturna","valsartan","valsartan-HCTZ","verapamil"} Duration of hyperlipidemia: {Blank single:19197::"chronic","months","years"} Cholesterol medication side effects: {Blank single:19197::"yes","no"} Cholesterol supplements: {Blank multiple:19196::"none","fish oil","niacin","red yeast rice"} Past cholesterol medications: {Blank multiple:19196::"none","atorvastain (lipitor)","lovastatin (mevacor)","pravastatin (pravachol)","rosuvastatin (crestor)","simvastatin (zocor)","vytorin","fenofibrate (tricor)","gemfibrozil","ezetimide (zetia)","niaspan","lovaza"} Medication compliance: {Blank single:19197::"excellent compliance","good compliance","fair compliance","poor compliance"} Aspirin: {Blank single:19197::"yes","no"} Recent stressors: {Blank single:19197::"yes","no"} Recurrent headaches: {Blank single:19197::"yes","no"} Visual changes: {Blank  single:19197::"yes","no"} Palpitations: {Blank single:19197::"yes","no"} Dyspnea: {Blank single:19197::"yes","no"} Chest pain: {Blank single:19197::"yes","no"} Lower extremity edema: {Blank single:19197::"yes","no"} Dizzy/lightheaded: {Blank single:19197::"yes","no"}  HYPOTHYROIDISM Thyroid control status:{Blank single:19197::"controlled","uncontrolled","better","worse","exacerbated","stable"} Satisfied with current treatment? {Blank single:19197::"yes","no"} Medication side effects: {Blank single:19197::"yes","no"} Medication compliance: {Blank single:19197::"excellent compliance","good compliance","fair compliance","poor compliance"} Etiology of hypothyroidism:  Recent dose adjustment:{Blank single:19197::"yes","no"} Fatigue: {Blank single:19197::"yes","no"} Cold intolerance: {Blank single:19197::"yes","no"} Heat intolerance: {Blank single:19197::"yes","no"} Weight gain: {Blank single:19197::"yes","no"} Weight loss: {Blank single:19197::"yes","no"} Constipation: {Blank single:19197::"yes","no"} Diarrhea/loose stools: {Blank single:19197::"yes","no"} Palpitations: {Blank single:19197::"yes","no"} Lower extremity edema: {Blank single:19197::"yes","no"} Anxiety/depressed mood: {Blank single:19197::"yes","no"}   Relevant past medical, surgical, family and social history reviewed and updated as indicated. Interim medical history since our last visit reviewed. Allergies and medications reviewed and updated.  Review of Systems  Per HPI unless specifically indicated above     Objective:    There were no vitals taken for this visit.  Wt Readings from Last 3 Encounters:  02/19/18 175 lb (79.4 kg)  11/22/17 179 lb 5 oz (81.3 kg)  08/22/17 179 lb 5 oz (81.3 kg)    Physical Exam  Results for orders placed or performed in visit on 11/22/17  Lipid Panel w/o Chol/HDL Ratio  Result Value Ref Range   Cholesterol, Total 257 (H) 100 - 199 mg/dL   Triglycerides 393 (H) 0 - 149 mg/dL    HDL 33 (L) >39 mg/dL   VLDL Cholesterol Cal 79 (H) 5 - 40 mg/dL   LDL Calculated 145 (H) 0 - 99 mg/dL  Comprehensive metabolic panel  Result Value Ref Range   Glucose 62 (L) 65 - 99 mg/dL   BUN 8 8 - 27 mg/dL   Creatinine, Ser 1.00 0.76 - 1.27 mg/dL   GFR calc non Af Amer 80 >59 mL/min/1.73   GFR calc Af Amer 92 >59 mL/min/1.73   BUN/Creatinine Ratio 8 (L) 10 - 24   Sodium 140 134 - 144 mmol/L   Potassium 4.5 3.5 - 5.2 mmol/L   Chloride 100 96 - 106 mmol/L   CO2 25 20 - 29 mmol/L   Calcium 9.7 8.6 - 10.2 mg/dL   Total Protein 6.7 6.0 - 8.5 g/dL   Albumin 4.5 3.6 - 4.8 g/dL   Globulin, Total 2.2 1.5 - 4.5 g/dL   Albumin/Globulin Ratio 2.0 1.2 - 2.2   Bilirubin Total 0.3 0.0 - 1.2 mg/dL   Alkaline Phosphatase 66 39 - 117 IU/L   AST 13 0 - 40 IU/L   ALT 14 0 - 44 IU/L      Assessment & Plan:   Problem List Items Addressed This Visit      Cardiovascular and Mediastinum  Hypertension - Primary     Endocrine   Thyroid disorder     Other   Hyperlipidemia       Follow up plan: No follow-ups on file.

## 2018-05-23 ENCOUNTER — Ambulatory Visit: Payer: Medicare Other | Admitting: Family Medicine

## 2018-05-23 ENCOUNTER — Telehealth: Payer: Self-pay | Admitting: Family Medicine

## 2018-05-23 LAB — LIPID PANEL W/O CHOL/HDL RATIO
Cholesterol, Total: 266 mg/dL — ABNORMAL HIGH (ref 100–199)
HDL: 37 mg/dL — ABNORMAL LOW (ref >39)
LDL Calculated: 190 mg/dL — ABNORMAL HIGH (ref 0–99)
Triglycerides: 194 mg/dL — ABNORMAL HIGH (ref 0–149)
VLDL Cholesterol Cal: 39 mg/dL (ref 5–40)

## 2018-05-23 LAB — CBC WITH DIFFERENTIAL/PLATELET
Basophils Absolute: 0.1 10*3/uL (ref 0.0–0.2)
Basos: 1 %
EOS (ABSOLUTE): 0.3 10*3/uL (ref 0.0–0.4)
Eos: 6 %
Hematocrit: 37.8 % (ref 37.5–51.0)
Hemoglobin: 13 g/dL (ref 13.0–17.7)
Immature Grans (Abs): 0 10*3/uL (ref 0.0–0.1)
Immature Granulocytes: 0 %
Lymphocytes Absolute: 2.5 10*3/uL (ref 0.7–3.1)
Lymphs: 46 %
MCH: 28.6 pg (ref 26.6–33.0)
MCHC: 34.4 g/dL (ref 31.5–35.7)
MCV: 83 fL (ref 79–97)
Monocytes Absolute: 0.5 10*3/uL (ref 0.1–0.9)
Monocytes: 9 %
Neutrophils Absolute: 2.1 10*3/uL (ref 1.4–7.0)
Neutrophils: 38 %
Platelets: 256 10*3/uL (ref 150–450)
RBC: 4.55 x10E6/uL (ref 4.14–5.80)
RDW: 12.9 % (ref 12.3–15.4)
WBC: 5.4 10*3/uL (ref 3.4–10.8)

## 2018-05-23 LAB — TSH: TSH: 1.9 u[IU]/mL (ref 0.450–4.500)

## 2018-05-23 LAB — COMPREHENSIVE METABOLIC PANEL
ALT: 16 IU/L (ref 0–44)
AST: 18 IU/L (ref 0–40)
Albumin/Globulin Ratio: 2 (ref 1.2–2.2)
Albumin: 4.5 g/dL (ref 3.6–4.8)
Alkaline Phosphatase: 69 IU/L (ref 39–117)
BUN/Creatinine Ratio: 10 (ref 10–24)
BUN: 9 mg/dL (ref 8–27)
Bilirubin Total: 0.5 mg/dL (ref 0.0–1.2)
CO2: 24 mmol/L (ref 20–29)
Calcium: 9.5 mg/dL (ref 8.6–10.2)
Chloride: 100 mmol/L (ref 96–106)
Creatinine, Ser: 0.89 mg/dL (ref 0.76–1.27)
GFR calc Af Amer: 105 mL/min/{1.73_m2} (ref >59)
GFR calc non Af Amer: 91 mL/min/{1.73_m2} (ref >59)
Globulin, Total: 2.3 g/dL (ref 1.5–4.5)
Glucose: 68 mg/dL (ref 65–99)
Potassium: 4.2 mmol/L (ref 3.5–5.2)
Sodium: 141 mmol/L (ref 134–144)
Total Protein: 6.8 g/dL (ref 6.0–8.5)

## 2018-05-23 LAB — PSA: Prostate Specific Ag, Serum: 0.4 ng/mL (ref 0.0–4.0)

## 2018-05-23 MED ORDER — ROSUVASTATIN CALCIUM 20 MG PO TABS: 20.0000 mg | ORAL_TABLET | Freq: Every day | ORAL | 1 refills | Status: DC

## 2018-05-23 NOTE — Telephone Encounter (Signed)
Patients wife notified

## 2018-05-23 NOTE — Telephone Encounter (Signed)
Please let him know that his labs came back under good control except his cholesterol is really high, so I sent him in a new medicine to his pharmacy to get it back down where it needs to be.

## 2018-06-01 ENCOUNTER — Encounter: Payer: Self-pay | Admitting: Family Medicine

## 2018-06-22 ENCOUNTER — Other Ambulatory Visit: Payer: Self-pay | Admitting: Family Medicine

## 2018-07-19 ENCOUNTER — Emergency Department
Admission: EM | Admit: 2018-07-19 | Discharge: 2018-07-19 | Disposition: A | Discharge status: Home / Self Care | Payer: Medicare Other | Attending: Emergency Medicine | Admitting: Emergency Medicine

## 2018-07-19 ENCOUNTER — Other Ambulatory Visit: Payer: Self-pay

## 2018-07-19 ENCOUNTER — Emergency Department: Payer: Medicare Other

## 2018-07-19 ENCOUNTER — Encounter: Payer: Self-pay | Admitting: Emergency Medicine

## 2018-07-19 DIAGNOSIS — R0602 Shortness of breath: Secondary | ICD-10-CM | POA: Insufficient documentation

## 2018-07-19 DIAGNOSIS — Z77098 Contact with and (suspected) exposure to other hazardous, chiefly nonmedicinal, chemicals: Secondary | ICD-10-CM

## 2018-07-19 DIAGNOSIS — Z96651 Presence of right artificial knee joint: Secondary | ICD-10-CM | POA: Diagnosis not present

## 2018-07-19 DIAGNOSIS — Z87891 Personal history of nicotine dependence: Secondary | ICD-10-CM | POA: Diagnosis not present

## 2018-07-19 DIAGNOSIS — Z79899 Other long term (current) drug therapy: Secondary | ICD-10-CM | POA: Insufficient documentation

## 2018-07-19 DIAGNOSIS — I10 Essential (primary) hypertension: Secondary | ICD-10-CM | POA: Insufficient documentation

## 2018-07-19 MED ORDER — ALBUTEROL SULFATE (2.5 MG/3ML) 0.083% IN NEBU
2.5000 mg | INHALATION_SOLUTION | Freq: Once | RESPIRATORY_TRACT | Status: AC
Start: 2018-07-19 — End: 2018-07-19
  Administered 2018-07-19: 2.5 mg via RESPIRATORY_TRACT
  Filled 2018-07-19: qty 3

## 2018-07-19 MED ORDER — METHYLPREDNISOLONE SODIUM SUCC 125 MG IJ SOLR
125.0000 mg | Freq: Once | INTRAMUSCULAR | Status: AC
  Administered 2018-07-19: 125 mg via INTRAVENOUS
  Filled 2018-07-19: qty 2

## 2018-07-19 NOTE — ED Notes (Signed)
Poison control contacted-no further action advised by pharmacist
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
## 2018-07-19 NOTE — Discharge Instructions (Addendum)
Return to the ER for new, worsening, persistent shortness of breath or tightness in your throat, difficulty speaking, weakness, or any other new or worsening symptoms that concern you.
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
## 2018-07-19 NOTE — ED Notes (Signed)
Pt ambulatory to toilet without difficulty. 

## 2018-08-02 DIAGNOSIS — H6531 Chronic mucoid otitis media, right ear: Secondary | ICD-10-CM | POA: Diagnosis not present

## 2018-08-02 DIAGNOSIS — H6123 Impacted cerumen, bilateral: Secondary | ICD-10-CM | POA: Diagnosis not present

## 2018-08-02 DIAGNOSIS — H6062 Unspecified chronic otitis externa, left ear: Secondary | ICD-10-CM | POA: Diagnosis not present

## 2018-09-13 DIAGNOSIS — H6531 Chronic mucoid otitis media, right ear: Secondary | ICD-10-CM | POA: Diagnosis not present

## 2018-09-13 DIAGNOSIS — H6062 Unspecified chronic otitis externa, left ear: Secondary | ICD-10-CM | POA: Diagnosis not present

## 2018-09-13 DIAGNOSIS — H6123 Impacted cerumen, bilateral: Secondary | ICD-10-CM | POA: Diagnosis not present

## 2018-11-12 ENCOUNTER — Other Ambulatory Visit: Payer: Self-pay | Admitting: Family Medicine

## 2018-11-13 NOTE — Telephone Encounter (Signed)
Requested Prescriptions  Pending Prescriptions Disp Refills  . rosuvastatin (CRESTOR) 20 MG tablet [Pharmacy Med Name: ROSUVASTATIN CALCIUM 20 MG TAB] 90 tablet 1    Sig: TAKE 1 TABLET BY MOUTH EVERY DAY     Cardiovascular:  Antilipid - Statins Failed - 11/12/2018  9:43 AM      Failed - Total Cholesterol in normal range and within 360 days    Cholesterol, Total  Date Value Ref Range Status  05/22/2018 266 (H) 100 - 199 mg/dL Final   Cholesterol Piccolo, Waived  Date Value Ref Range Status  05/21/2016 251 (H) <200 mg/dL Final    Comment:                            Desirable                <200                         Borderline High      200- 239                         High                     >239          Failed - LDL in normal range and within 360 days    LDL Calculated  Date Value Ref Range Status  05/22/2018 190 (H) 0 - 99 mg/dL Final         Failed - HDL in normal range and within 360 days    HDL  Date Value Ref Range Status  05/22/2018 37 (L) >39 mg/dL Final         Failed - Triglycerides in normal range and within 360 days    Triglycerides  Date Value Ref Range Status  05/22/2018 194 (H) 0 - 149 mg/dL Final   Triglycerides Piccolo,Waived  Date Value Ref Range Status  05/21/2016 292 (H) <150 mg/dL Final    Comment:                            Normal                   <150                         Borderline High     150 - 199                         High                200 - 499                         Very High                >499          Passed - Patient is not pregnant      Passed - Valid encounter within last 12 months    Recent Outpatient Visits          5 months ago Medicare annual wellness visit, subsequent   [employer], [place], DO   11 months ago Mixed hyperlipidemia   [facility] Johnson, [place] P, DO   1 year ago Mixed hyperlipidemia   [facility] [place], [place], DO   1 year ago Essential  hypertension   [place], [place], DO   1 year ago Sensorineural hearing loss (SNHL) of both ears   [facility] [place], [place], DO      Future Appointments            In 1 week Wynetta Emery, Barb Merino, DO [employer] [employer], [facility]

## 2018-11-22 ENCOUNTER — Ambulatory Visit (INDEPENDENT_AMBULATORY_CARE_PROVIDER_SITE_OTHER): Payer: Medicare Other | Admitting: Family Medicine

## 2018-11-22 ENCOUNTER — Encounter: Payer: Self-pay | Admitting: Family Medicine

## 2018-11-22 VITALS — BP 114/71 | HR 68 | Temp 97.8°F | Ht 73.0 in | Wt 186.3 lb

## 2018-11-22 DIAGNOSIS — E079 Disorder of thyroid, unspecified: Secondary | ICD-10-CM | POA: Diagnosis not present

## 2018-11-22 DIAGNOSIS — M47894 Other spondylosis, thoracic region: Secondary | ICD-10-CM | POA: Diagnosis not present

## 2018-11-22 DIAGNOSIS — M47814 Spondylosis without myelopathy or radiculopathy, thoracic region: Secondary | ICD-10-CM | POA: Insufficient documentation

## 2018-11-22 DIAGNOSIS — I10 Essential (primary) hypertension: Secondary | ICD-10-CM

## 2018-11-22 DIAGNOSIS — E782 Mixed hyperlipidemia: Secondary | ICD-10-CM

## 2018-11-22 MED ORDER — ROSUVASTATIN CALCIUM 20 MG PO TABS: 20.0000 mg | ORAL_TABLET | Freq: Every day | ORAL | 1 refills | Status: DC

## 2018-11-22 MED ORDER — AMLODIPINE BESYLATE 2.5 MG PO TABS: 2.5000 mg | ORAL_TABLET | Freq: Every day | ORAL | 1 refills | Status: DC

## 2018-11-22 NOTE — Assessment & Plan Note (Signed)
Rechecking levels today. Will treat as needed. Call with any concerns.  

## 2018-11-22 NOTE — Assessment & Plan Note (Signed)
Under good control on current regimen. Continue current regimen. Continue to monitor. Call with any concerns. Refills given. Labs checked today.  

## 2018-11-22 NOTE — Progress Notes (Signed)
BP 114/71 (BP Location: Left Arm, Patient Position: Sitting, Cuff Size: Normal)   Pulse 68   Temp 97.8 F (36.6 C)   Ht 6\' 1"  (1.854 m)   Wt 186 lb 5 oz (84.5 kg)   SpO2 97%   BMI 24.58 kg/m    Subjective:    Patient ID: [name], male    DOB: [birth year], 63 y.o.   MRN: [id number]  HPI: Marcus Wright is a 63 y.o. male  Chief Complaint  Patient presents with  . Hyperlipidemia  . Hypertension  . Hypothyroidism   HYPERTENSION / HYPERLIPIDEMIA Satisfied with current treatment? yes Duration of hypertension: chronic BP monitoring frequency: not checking BP medication side effects: no Past BP meds: amlodipine Duration of hyperlipidemia: chronic Cholesterol medication side effects: no Cholesterol supplements: none Past cholesterol medications: crestor Medication compliance: excellent compliance Aspirin: no Recent stressors: no Recurrent headaches: no Visual changes: no Palpitations: no Dyspnea: no Chest pain: no Lower extremity edema: no Dizzy/lightheaded: no  Relevant past medical, surgical, family and social history reviewed and updated as indicated. Interim medical history since our last visit reviewed. Allergies and medications reviewed and updated.  Review of Systems  Constitutional: Negative.   Respiratory: Negative.   Cardiovascular: Negative.   Musculoskeletal: Positive for back pain and myalgias. Negative for arthralgias, gait problem, joint swelling, neck pain and neck stiffness.  Skin: Negative.   Neurological: Negative.   Psychiatric/Behavioral: Negative.     Per HPI unless specifically indicated above     Objective:    BP 114/71 (BP Location: Left Arm, Patient Position: Sitting, Cuff Size: Normal)   Pulse 68   Temp 97.8 F (36.6 C)   Ht 6\' 1"  (1.854 m)   Wt 186 lb 5 oz (84.5 kg)   SpO2 97%   BMI 24.58 kg/m   Wt Readings from Last 3 Encounters:  11/22/18 186 lb 5 oz (84.5 kg)  07/19/18 180 lb (81.6 kg)  05/22/18 182 lb 1 oz  (82.6 kg)    Physical Exam Vitals signs and nursing note reviewed.  Constitutional:      General: He is not in acute distress.    Appearance: Normal appearance. He is not ill-appearing, toxic-appearing or diaphoretic.  HENT:     Head: Normocephalic and atraumatic.     Right Ear: External ear normal.     Left Ear: External ear normal.     Nose: Nose normal.     Mouth/Throat:     Mouth: Mucous membranes are moist.     Pharynx: Oropharynx is clear.  Eyes:     General: No scleral icterus.       Right eye: No discharge.        Left eye: No discharge.     Extraocular Movements: Extraocular movements intact.     Conjunctiva/sclera: Conjunctivae normal.     Pupils: Pupils are equal, round, and reactive to light.  Neck:     Musculoskeletal: Normal range of motion and neck supple.  Cardiovascular:     Rate and Rhythm: Normal rate and regular rhythm.     Pulses: Normal pulses.     Heart sounds: Normal heart sounds. No murmur. No friction rub. No gallop.   Pulmonary:     Effort: Pulmonary effort is normal. No respiratory distress.     Breath sounds: Normal breath sounds. No stridor. No wheezing, rhonchi or rales.  Chest:     Chest wall: No tenderness.  Musculoskeletal: Normal range of motion.  Skin:  General: Skin is warm and dry.     Capillary Refill: Capillary refill takes less than 2 seconds.     Coloration: Skin is not jaundiced or pale.     Findings: No bruising, erythema, lesion or rash.  Neurological:     General: No focal deficit present.     Mental Status: He is alert and oriented to person, place, and time. Mental status is at baseline.  Psychiatric:        Mood and Affect: Mood normal.        Behavior: Behavior normal.        Thought Content: Thought content normal.        Judgment: Judgment normal.     Results for orders placed or performed in visit on 05/22/18  Comprehensive metabolic panel  Result Value Ref Range   Glucose 68 65 - 99 mg/dL   BUN 9 8 - 27  mg/dL   Creatinine, Ser 0.89 0.76 - 1.27 mg/dL   GFR calc non Af Amer 91 >59 mL/min/1.73   GFR calc Af Amer 105 >59 mL/min/1.73   BUN/Creatinine Ratio 10 10 - 24   Sodium 141 134 - 144 mmol/L   Potassium 4.2 3.5 - 5.2 mmol/L   Chloride 100 96 - 106 mmol/L   CO2 24 20 - 29 mmol/L   Calcium 9.5 8.6 - 10.2 mg/dL   Total Protein 6.8 6.0 - 8.5 g/dL   Albumin 4.5 3.6 - 4.8 g/dL   Globulin, Total 2.3 1.5 - 4.5 g/dL   Albumin/Globulin Ratio 2.0 1.2 - 2.2   Bilirubin Total 0.5 0.0 - 1.2 mg/dL   Alkaline Phosphatase 69 39 - 117 IU/L   AST 18 0 - 40 IU/L   ALT 16 0 - 44 IU/L  Lipid Panel w/o Chol/HDL Ratio  Result Value Ref Range   Cholesterol, Total 266 (H) 100 - 199 mg/dL   Triglycerides 194 (H) 0 - 149 mg/dL   HDL 37 (L) >39 mg/dL   VLDL Cholesterol Cal 39 5 - 40 mg/dL   LDL Calculated 190 (H) 0 - 99 mg/dL  TSH  Result Value Ref Range   TSH 1.900 0.450 - 4.500 uIU/mL  CBC with Differential/Platelet  Result Value Ref Range   WBC 5.4 3.4 - 10.8 x10E3/uL   RBC 4.55 4.14 - 5.80 x10E6/uL   Hemoglobin 13.0 13.0 - 17.7 g/dL   Hematocrit 37.8 37.5 - 51.0 %   MCV 83 79 - 97 fL   MCH 28.6 26.6 - 33.0 pg   MCHC 34.4 31.5 - 35.7 g/dL   RDW 12.9 12.3 - 15.4 %   Platelets 256 150 - 450 x10E3/uL   Neutrophils 38 Not Estab. %   Lymphs 46 Not Estab. %   Monocytes 9 Not Estab. %   Eos 6 Not Estab. %   Basos 1 Not Estab. %   Neutrophils Absolute 2.1 1.4 - 7.0 x10E3/uL   Lymphocytes Absolute 2.5 0.7 - 3.1 x10E3/uL   Monocytes Absolute 0.5 0.1 - 0.9 x10E3/uL   EOS (ABSOLUTE) 0.3 0.0 - 0.4 x10E3/uL   Basophils Absolute 0.1 0.0 - 0.2 x10E3/uL   Immature Granulocytes 0 Not Estab. %   Immature Grans (Abs) 0.0 0.0 - 0.1 x10E3/uL  UA/M w/rflx Culture, Routine  Result Value Ref Range   Specific Gravity, UA 1.010 1.005 - 1.030   pH, UA 7.0 5.0 - 7.5   Color, UA Yellow Yellow   Appearance Ur Clear Clear   Leukocytes, UA Negative Negative  Protein, UA Negative Negative/Trace   Glucose, UA  Negative Negative   Ketones, UA Negative Negative   RBC, UA Negative Negative   Bilirubin, UA Negative Negative   Urobilinogen, Ur 0.2 0.2 - 1.0 mg/dL   Nitrite, UA Negative Negative  PSA  Result Value Ref Range   Prostate Specific Ag, Serum 0.4 0.0 - 4.0 ng/mL      Assessment & Plan:   Problem List Items Addressed This Visit      Cardiovascular and Mediastinum   Hypertension - Primary    Under good control on current regimen. Continue current regimen. Continue to monitor. Call with any concerns. Refills given. Labs checked today       Relevant Medications   rosuvastatin (CRESTOR) 20 MG tablet   amLODipine (NORVASC) 2.5 MG tablet   Other Relevant Orders   Comprehensive metabolic panel     Endocrine   Thyroid disorder    Rechecking levels today. Will treat as needed. Call with any concerns.      Relevant Orders   Comprehensive metabolic panel   TSH     Musculoskeletal and Integument   DJD (degenerative joint disease), thoracic     Other   Hyperlipidemia    Under good control on current regimen. Continue current regimen. Continue to monitor. Call with any concerns. Refills given. Labs checked today      Relevant Medications   rosuvastatin (CRESTOR) 20 MG tablet   amLODipine (NORVASC) 2.5 MG tablet   Other Relevant Orders   Comprehensive metabolic panel   Lipid Panel w/o Chol/HDL Ratio       Follow up plan: Return in about 6 months (around 05/23/2019) for Wellness and physical.

## 2018-11-22 NOTE — Patient Instructions (Signed)

## 2018-11-23 ENCOUNTER — Encounter: Payer: Self-pay | Admitting: Family Medicine

## 2018-11-23 LAB — COMPREHENSIVE METABOLIC PANEL
ALT: 15 IU/L (ref 0–44)
AST: 17 IU/L (ref 0–40)
Albumin/Globulin Ratio: 2.1 (ref 1.2–2.2)
Albumin: 4.5 g/dL (ref 3.8–4.8)
Alkaline Phosphatase: 60 IU/L (ref 39–117)
BUN/Creatinine Ratio: 10 (ref 10–24)
BUN: 9 mg/dL (ref 8–27)
Bilirubin Total: 0.4 mg/dL (ref 0.0–1.2)
CO2: 26 mmol/L (ref 20–29)
Calcium: 9.7 mg/dL (ref 8.6–10.2)
Chloride: 101 mmol/L (ref 96–106)
Creatinine, Ser: 0.86 mg/dL (ref 0.76–1.27)
GFR calc Af Amer: 106 mL/min/{1.73_m2} (ref >59)
GFR calc non Af Amer: 92 mL/min/{1.73_m2} (ref >59)
Globulin, Total: 2.1 g/dL (ref 1.5–4.5)
Glucose: 77 mg/dL (ref 65–99)
Potassium: 4.1 mmol/L (ref 3.5–5.2)
Sodium: 139 mmol/L (ref 134–144)
Total Protein: 6.6 g/dL (ref 6.0–8.5)

## 2018-11-23 LAB — LIPID PANEL W/O CHOL/HDL RATIO
Cholesterol, Total: 137 mg/dL (ref 100–199)
HDL: 37 mg/dL — ABNORMAL LOW (ref >39)
LDL Calculated: 64 mg/dL (ref 0–99)
Triglycerides: 179 mg/dL — ABNORMAL HIGH (ref 0–149)
VLDL Cholesterol Cal: 36 mg/dL (ref 5–40)

## 2018-11-23 LAB — TSH: TSH: 1.1 u[IU]/mL (ref 0.450–4.500)

## 2018-12-11 ENCOUNTER — Other Ambulatory Visit: Payer: Self-pay | Admitting: Family Medicine

## 2018-12-15 ENCOUNTER — Telehealth: Payer: Self-pay

## 2018-12-22 ENCOUNTER — Telehealth: Payer: Self-pay

## 2019-01-05 ENCOUNTER — Telehealth: Payer: Self-pay

## 2019-01-12 ENCOUNTER — Telehealth: Payer: Self-pay

## 2019-01-19 ENCOUNTER — Telehealth: Payer: Self-pay

## 2019-01-31 DIAGNOSIS — H6531 Chronic mucoid otitis media, right ear: Secondary | ICD-10-CM | POA: Diagnosis not present

## 2019-01-31 DIAGNOSIS — H6062 Unspecified chronic otitis externa, left ear: Secondary | ICD-10-CM | POA: Diagnosis not present

## 2019-01-31 DIAGNOSIS — H6123 Impacted cerumen, bilateral: Secondary | ICD-10-CM | POA: Diagnosis not present

## 2019-02-06 ENCOUNTER — Telehealth: Payer: Self-pay

## 2019-02-08 ENCOUNTER — Telehealth: Payer: Self-pay

## 2019-02-13 ENCOUNTER — Telehealth: Payer: Self-pay | Admitting: *Deleted

## 2019-02-13 ENCOUNTER — Ambulatory Visit: Payer: Self-pay | Admitting: *Deleted

## 2019-02-13 NOTE — Chronic Care Management (AMB) (Signed)
°  Chronic Care Management   Outreach Note  02/13/2019 Name: Marcus Wright MRN: [id number] DOB: 26-Feb-1956  Referred by: Valerie Roys, DO Reason for referral : No chief complaint on file.   Third unsuccessful telephone outreach was attempted today. The patient was referred to the case management team for assistance with chronic care management and care coordination. The patient's primary care provider has been notified of our unsuccessful attempts to make or maintain contact with the patient. The care management team is pleased to engage with this patient at any time in the future should he/she be interested in assistance from the care management team.   Follow Up Plan: The care management team is available to follow up with the patient after provider conversation with the patient regarding recommendation for care management engagement and subsequent re-referral to the care management team.   [place]  [email or web address]   ??[phone]

## 2019-02-13 NOTE — Telephone Encounter (Signed)
@  [facility]   Outreach Note  02/13/2019 Name: Marcus Wright MRN: [id number] DOB: [birth year]  Referred by: Valerie Roys, DO Reason for referral : Chronic Care Management (Third CCM outreach call)   Third unsuccessful telephone outreach was attempted today. The patient was referred to the case management team for assistance with chronic care management and care coordination. The patient's primary care provider has been notified of our unsuccessful attempts to make or maintain contact with the patient. The care management team is pleased to engage with this patient at any time in the future should he/she be interested in assistance from the care management team.   Follow Up Plan: The care management team is available to follow up with the patient after provider conversation with the patient regarding recommendation for care management engagement and subsequent re-referral to the care management team.  [place]  [email or web address]   ??[phone]

## 2019-02-16 ENCOUNTER — Telehealth: Payer: Self-pay

## 2019-04-26 ENCOUNTER — Encounter: Payer: Self-pay | Admitting: *Deleted

## 2019-04-26 ENCOUNTER — Emergency Department: Payer: Medicare Other

## 2019-04-26 ENCOUNTER — Emergency Department
Admission: EM | Admit: 2019-04-26 | Discharge: 2019-04-26 | Disposition: A | Discharge status: Home / Self Care | Payer: Medicare Other | Attending: Student in an Organized Health Care Education/Training Program | Admitting: Student in an Organized Health Care Education/Training Program

## 2019-04-26 ENCOUNTER — Other Ambulatory Visit: Payer: Self-pay

## 2019-04-26 DIAGNOSIS — L03116 Cellulitis of left lower limb: Secondary | ICD-10-CM | POA: Insufficient documentation

## 2019-04-26 DIAGNOSIS — R200 Anesthesia of skin: Secondary | ICD-10-CM | POA: Diagnosis not present

## 2019-04-26 DIAGNOSIS — M25562 Pain in left knee: Secondary | ICD-10-CM | POA: Diagnosis present

## 2019-04-26 DIAGNOSIS — Z87891 Personal history of nicotine dependence: Secondary | ICD-10-CM | POA: Diagnosis not present

## 2019-04-26 DIAGNOSIS — I10 Essential (primary) hypertension: Secondary | ICD-10-CM | POA: Diagnosis not present

## 2019-04-26 DIAGNOSIS — R29818 Other symptoms and signs involving the nervous system: Secondary | ICD-10-CM | POA: Diagnosis not present

## 2019-04-26 DIAGNOSIS — Z96651 Presence of right artificial knee joint: Secondary | ICD-10-CM | POA: Insufficient documentation

## 2019-04-26 DIAGNOSIS — R0602 Shortness of breath: Secondary | ICD-10-CM | POA: Diagnosis not present

## 2019-04-26 DIAGNOSIS — Z79899 Other long term (current) drug therapy: Secondary | ICD-10-CM | POA: Diagnosis not present

## 2019-04-26 DIAGNOSIS — Z85828 Personal history of other malignant neoplasm of skin: Secondary | ICD-10-CM | POA: Diagnosis not present

## 2019-04-26 LAB — COMPREHENSIVE METABOLIC PANEL
ALT: 13 U/L (ref 0–44)
AST: 20 U/L (ref 15–41)
Albumin: 4.9 g/dL (ref 3.5–5.0)
Alkaline Phosphatase: 63 U/L (ref 38–126)
Anion gap: 8 (ref 5–15)
BUN: 10 mg/dL (ref 8–23)
CO2: 27 mmol/L (ref 22–32)
Calcium: 9.5 mg/dL (ref 8.9–10.3)
Chloride: 101 mmol/L (ref 98–111)
Creatinine, Ser: 0.76 mg/dL (ref 0.61–1.24)
GFR calc Af Amer: >60 mL/min (ref >60)
GFR calc non Af Amer: >60 mL/min (ref >60)
Glucose, Bld: 103 mg/dL — ABNORMAL HIGH (ref 70–99)
Potassium: 3.3 mmol/L — ABNORMAL LOW (ref 3.5–5.1)
Sodium: 136 mmol/L (ref 135–145)
Total Bilirubin: 0.9 mg/dL (ref 0.3–1.2)
Total Protein: 7.5 g/dL (ref 6.5–8.1)

## 2019-04-26 LAB — PROTIME-INR
INR: 1 (ref 0.8–1.2)
Prothrombin Time: 13.4 seconds (ref 11.4–15.2)

## 2019-04-26 LAB — DIFFERENTIAL
Abs Immature Granulocytes: 0.01 10*3/uL (ref 0.00–0.07)
Basophils Absolute: 0.1 10*3/uL (ref 0.0–0.1)
Basophils Relative: 1 %
Eosinophils Absolute: 0.2 10*3/uL (ref 0.0–0.5)
Eosinophils Relative: 3 %
Immature Granulocytes: 0 %
Lymphocytes Relative: 29 %
Lymphs Abs: 2.2 10*3/uL (ref 0.7–4.0)
Monocytes Absolute: 0.6 10*3/uL (ref 0.1–1.0)
Monocytes Relative: 7 %
Neutro Abs: 4.6 10*3/uL (ref 1.7–7.7)
Neutrophils Relative %: 60 %

## 2019-04-26 LAB — CBC
HCT: 34.3 % — ABNORMAL LOW (ref 39.0–52.0)
Hemoglobin: 11.8 g/dL — ABNORMAL LOW (ref 13.0–17.0)
MCH: 28.8 pg (ref 26.0–34.0)
MCHC: 34.4 g/dL (ref 30.0–36.0)
MCV: 83.7 fL (ref 80.0–100.0)
Platelets: 227 10*3/uL (ref 150–400)
RBC: 4.1 MIL/uL — ABNORMAL LOW (ref 4.22–5.81)
RDW: 12.5 % (ref 11.5–15.5)
WBC: 7.7 10*3/uL (ref 4.0–10.5)
nRBC: 0 % (ref 0.0–0.2)

## 2019-04-26 LAB — APTT: aPTT: 30 seconds (ref 24–36)

## 2019-04-26 MED ORDER — SODIUM CHLORIDE 0.9% FLUSH: 3.0000 mL | Freq: Once | INTRAVENOUS | Status: DC

## 2019-04-26 MED ORDER — SULFAMETHOXAZOLE-TRIMETHOPRIM 800-160 MG PO TABS: 1.0000 | ORAL_TABLET | Freq: Two times a day (BID) | ORAL | 0 refills | Status: AC

## 2019-04-26 MED ORDER — TRAMADOL HCL 50 MG PO TABS: 50.0000 mg | ORAL_TABLET | Freq: Four times a day (QID) | ORAL | 0 refills | Status: DC | PRN

## 2019-04-26 MED ORDER — ACETAMINOPHEN 500 MG PO TABS
1000.0000 mg | ORAL_TABLET | Freq: Once | ORAL | Status: AC
  Administered 2019-04-26: 1000 mg via ORAL
  Filled 2019-04-26: qty 2

## 2019-04-26 MED ORDER — SULFAMETHOXAZOLE-TRIMETHOPRIM 800-160 MG PO TABS
1.0000 | ORAL_TABLET | Freq: Once | ORAL | Status: AC
  Administered 2019-04-26: 19:00:00 1 via ORAL
  Filled 2019-04-26: qty 1

## 2019-04-26 MED ORDER — TRAMADOL HCL 50 MG PO TABS
50.0000 mg | ORAL_TABLET | Freq: Once | ORAL | Status: AC
  Administered 2019-04-26: 50 mg via ORAL
  Filled 2019-04-26: qty 1

## 2019-04-26 NOTE — ED Provider Notes (Signed)
[facility] Emergency Department Provider Note    First MD Initiated Contact with Patient 04/26/19 1804     (approximate)  I have reviewed the triage vital signs and the nursing notes.   HISTORY  Chief Complaint Abscess and Numbness    HPI Marcus Wright is a 63 y.o. male the below listed past medical history presents the ER with primary complaint of moderate to severe left knee pain.  Patient is very hard of hearing much the history provided by family member at bedside.  Reported was outside got bit by an insect several times.  This is several days ago and then over the past 2 days started having worsening swelling and pain to superior aspect of the left knee.  Felt he was developing a boil.  He did try to squeeze and pop it.  Did have some purulent drainage but since then is developed worsening redness and pain around that location.  No measured fevers.  On the way over here was riding in the car with family members started feeling shortness of breath as well as stating that he was having tingling in the left side.  States it was brief in nature and related to the pain from the redness.  Denies any history of stroke.  No blurry vision.  No numbness or tingling at this time.    Past Medical History:  Diagnosis Date  . Cancer [facility]) 2003   skin'  . Hypertension   . Thyroid disorder   . Wears dentures    full upper  . Wears hearing aid    History reviewed. No pertinent family history. Past Surgical History:  Procedure Laterality Date  . BACK SURGERY  2011  . BREAST SURGERY Right 11-22-12  . CHOLECYSTECTOMY    . COLONOSCOPY    . COLONOSCOPY WITH PROPOFOL N/A 07/19/2016   Procedure: COLONOSCOPY WITH PROPOFOL;  Surgeon: Lucilla Lame, MD;  Location: [place];  Service: Endoscopy;  Laterality: N/A;  . REPLACEMENT TOTAL KNEE Right 1993  . SINUS SURGERY WITH INSTATRAK  2003   cancer   Patient Active Problem List   Diagnosis Date Noted  . DJD  (degenerative joint disease), thoracic 11/22/2018  . Mastoiditis of right side 06/22/2017  . Hearing loss 05/10/2017  . Colonic constipation   . Hyperlipidemia 05/21/2016  . History of sinus cancer   . Hypertension   . Thyroid disorder   . Cervical radicular pain 02/07/2014  . History of neck surgery 02/07/2014  . Gynecomastia, male 06/20/2013      Prior to Admission medications   Medication Sig Start Date End Date Taking? Authorizing Provider  amLODipine (NORVASC) 2.5 MG tablet Take 1 tablet (2.5 mg total) by mouth daily. 11/22/18   Johnson, Megan P, DO  levothyroxine (SYNTHROID, LEVOTHROID) 50 MCG tablet TAKE 1 TABLET (50 MCG TOTAL) BY MOUTH DAILY. 12/11/18   Johnson, Megan P, DO  rosuvastatin (CRESTOR) 20 MG tablet Take 1 tablet (20 mg total) by mouth daily. 11/22/18   Valerie Roys, DO    Allergies Doxazosin    Social History Social History   Tobacco Use  . Smoking status: Former Research scientist (life sciences)  . Smokeless tobacco: Never Used  . Tobacco comment: quit 20+ yrs ago  Substance Use Topics  . Alcohol use: Yes    Alcohol/week: 6.0 standard drinks    Types: 6 Cans of beer per week    Comment: on occasion  . Drug use: No    Review of Systems Patient  denies headaches, rhinorrhea, blurry vision, numbness, shortness of breath, chest pain, edema, cough, abdominal pain, nausea, vomiting, diarrhea, dysuria, fevers, rashes or hallucinations unless otherwise stated above in HPI. ____________________________________________   PHYSICAL EXAM:  VITAL SIGNS: Vitals:   04/26/19 1602  BP: 111/68  Pulse: 67  Resp: 18  Temp: 98.1 F (36.7 C)  SpO2: 100%    Constitutional: Alert and oriented. Very HOH Eyes: Conjunctivae are normal.  Head: Atraumatic. Nose: No congestion/rhinnorhea. Mouth/Throat: Mucous membranes are moist.   Neck: No stridor. Painless ROM.  Cardiovascular: Normal rate, regular rhythm. Grossly normal heart sounds.  Good peripheral circulation. Respiratory: Normal  respiratory effort.  No retractions. Lungs CTAB. Gastrointestinal: Soft and nontender. No distention. No abdominal bruits. No CVA tenderness. Genitourinary:  Musculoskeletal: 6 cm of tender erythema streaking consistent with cellulitis just proximal to the left patella.  There is no fluctuance.  No active drainage.  There is no effusion.  No joint effusions. Neurologic: CN- intact.  No facial droop, Normal FNF.  Normal heel to shin.  Sensation intact bilaterally. Normal speech and language. No gross focal neurologic deficits are appreciated. No gait instability. Skin:  Skin is warm, dry and intact. No rash noted. Psychiatric: Mood and affect are normal. Speech and behavior are normal.  ____________________________________________   LABS (all labs ordered are listed, but only abnormal results are displayed)  Results for orders placed or performed during the hospital encounter of 04/26/19 (from the past 24 hour(s))  Protime-INR     Status: None   Collection Time: 04/26/19  4:20 PM  Result Value Ref Range   Prothrombin Time 13.4 11.4 - 15.2 seconds   INR 1.0 0.8 - 1.2  APTT     Status: None   Collection Time: 04/26/19  4:20 PM  Result Value Ref Range   aPTT 30 24 - 36 seconds  CBC     Status: Abnormal   Collection Time: 04/26/19  4:20 PM  Result Value Ref Range   WBC 7.7 4.0 - 10.5 K/uL   RBC 4.10 (L) 4.22 - 5.81 MIL/uL   Hemoglobin 11.8 (L) 13.0 - 17.0 g/dL   HCT 34.3 (L) 39.0 - 52.0 %   MCV 83.7 80.0 - 100.0 fL   MCH 28.8 26.0 - 34.0 pg   MCHC 34.4 30.0 - 36.0 g/dL   RDW 12.5 11.5 - 15.5 %   Platelets 227 150 - 400 K/uL   nRBC 0.0 0.0 - 0.2 %  Differential     Status: None   Collection Time: 04/26/19  4:20 PM  Result Value Ref Range   Neutrophils Relative % 60 %   Neutro Abs 4.6 1.7 - 7.7 K/uL   Lymphocytes Relative 29 %   Lymphs Abs 2.2 0.7 - 4.0 K/uL   Monocytes Relative 7 %   Monocytes Absolute 0.6 0.1 - 1.0 K/uL   Eosinophils Relative 3 %   Eosinophils Absolute 0.2  0.0 - 0.5 K/uL   Basophils Relative 1 %   Basophils Absolute 0.1 0.0 - 0.1 K/uL   Immature Granulocytes 0 %   Abs Immature Granulocytes 0.01 0.00 - 0.07 K/uL  Comprehensive metabolic panel     Status: Abnormal   Collection Time: 04/26/19  4:20 PM  Result Value Ref Range   Sodium 136 135 - 145 mmol/L   Potassium 3.3 (L) 3.5 - 5.1 mmol/L   Chloride 101 98 - 111 mmol/L   CO2 27 22 - 32 mmol/L   Glucose, Bld 103 (H) 70 - 99  mg/dL   BUN 10 8 - 23 mg/dL   Creatinine, Ser 0.76 0.61 - 1.24 mg/dL   Calcium 9.5 8.9 - 10.3 mg/dL   Total Protein 7.5 6.5 - 8.1 g/dL   Albumin 4.9 3.5 - 5.0 g/dL   AST 20 15 - 41 U/L   ALT 13 0 - 44 U/L   Alkaline Phosphatase 63 38 - 126 U/L   Total Bilirubin 0.9 0.3 - 1.2 mg/dL   GFR calc non Af Amer >60 >60 mL/min   GFR calc Af Amer >60 >60 mL/min   Anion gap 8 5 - 15   ____________________________________________  EKG My review and personal interpretation at Time:   16:19 Indication: knee pain  Rate: 70  Rhythm: sinus Axis: normal Other: normal intervals, no stemi ____________________________________________  RADIOLOGY  I personally reviewed all radiographic images ordered to evaluate for the above acute complaints and reviewed radiology reports and findings.  These findings were personally discussed with the patient.  Please see medical record for radiology report.  ____________________________________________   PROCEDURES  Procedure(s) performed:  Procedures  EMERGENCY DEPARTMENT US SOFT TISSUE INTERPRETATION "Study: Limited Soft Tissue Ultrasound"  INDICATIONS: Soft tissue infection Multiple views of the body part were obtained in real-time with a multi-frequency linear probe  PERFORMED BY: Myself IMAGES ARCHIVED?: No SIDE:Left BODY PART:Lower extremity INTERPRETATION:  Cellulitis present and no foreign body, no effusion      Critical Care performed: no ____________________________________________   INITIAL IMPRESSION /  ASSESSMENT AND PLAN / ED COURSE  Pertinent labs & imaging results that were available during my care of the patient were reviewed by me and considered in my medical decision making (see chart for details).   DDX: abscess, cellulitis, nsti, FB, septic arthritis  Zakery Normington is a 63 y.o. who presents to the ED with symptoms as described above.  He is afebrile hemodynamically stable.  Presentation consistent with cellulitis surrounding likely insect bite.  No evidence of drainable abscess.  CT imaging ordered out of triage shows no evidence of stroke.  Does not seem consistent with TIA or CNS process.  Symptoms not consistent with an STI.  No evidence of foreign body.  Will be given antibiotics.  Discussed signs and symptoms for which he should return to the ER and follow up with PCP.     The patient was evaluated in Emergency Department today for the symptoms described in the history of present illness. He/she was evaluated in the context of the global COVID-19 pandemic, which necessitated consideration that the patient might be at risk for infection with the SARS-CoV-2 virus that causes COVID-19. Institutional protocols and algorithms that pertain to the evaluation of patients at risk for COVID-19 are in a state of rapid change based on information released by regulatory bodies including the CDC and federal and state organizations. These policies and algorithms were followed during the patient's care in the ED.  As part of my medical decision making, I reviewed the following data within the [place] notes reviewed and incorporated, Labs reviewed, notes from prior ED visits and [place] Controlled Substance Database   ____________________________________________   FINAL CLINICAL IMPRESSION(S) / ED DIAGNOSES  Final diagnoses:  Cellulitis of left lower extremity      NEW MEDICATIONS STARTED DURING THIS VISIT:  New Prescriptions   No medications on file      Note:  This document was prepared using Dragon voice recognition software and may include unintentional dictation errors.  Merlyn Lot, MD 04/26/19 [phone]

## 2019-04-26 NOTE — ED Triage Notes (Signed)
Pt presents w/ c/o L knee abscess. Pt also c/o L arm and L leg numbness. Tested sensation and pt able to feel pressure in bilateral extremities. Pt states he feels only deep pressure on L side. Grips equal and moderate. No weakness in L upper extremity. No extinction on L side. Pt c/o numbness starting last night after he applied pressure to abscess and caused it to drain. Pt began c/o shortness of breath when he was sitting in car while his wife went inside somewhere. Pt in no acute respiratory distress at this time. Pt Is extremely hard of hearing.

## 2019-05-14 DIAGNOSIS — H6123 Impacted cerumen, bilateral: Secondary | ICD-10-CM | POA: Diagnosis not present

## 2019-05-14 DIAGNOSIS — H6062 Unspecified chronic otitis externa, left ear: Secondary | ICD-10-CM | POA: Diagnosis not present

## 2019-05-14 DIAGNOSIS — H6531 Chronic mucoid otitis media, right ear: Secondary | ICD-10-CM | POA: Diagnosis not present

## 2019-05-28 ENCOUNTER — Ambulatory Visit (INDEPENDENT_AMBULATORY_CARE_PROVIDER_SITE_OTHER): Payer: Medicare Other | Admitting: Family Medicine

## 2019-05-28 ENCOUNTER — Other Ambulatory Visit: Payer: Self-pay

## 2019-05-28 ENCOUNTER — Ambulatory Visit (INDEPENDENT_AMBULATORY_CARE_PROVIDER_SITE_OTHER): Payer: Medicare Other

## 2019-05-28 VITALS — BP 112/68 | HR 90 | Temp 97.7°F | Resp 16 | Ht 72.0 in | Wt 171.5 lb

## 2019-05-28 VITALS — BP 112/68 | HR 90 | Temp 97.7°F | Ht 73.0 in | Wt 171.3 lb

## 2019-05-28 DIAGNOSIS — Z125 Encounter for screening for malignant neoplasm of prostate: Secondary | ICD-10-CM

## 2019-05-28 DIAGNOSIS — H903 Sensorineural hearing loss, bilateral: Secondary | ICD-10-CM

## 2019-05-28 DIAGNOSIS — I10 Essential (primary) hypertension: Secondary | ICD-10-CM

## 2019-05-28 DIAGNOSIS — E782 Mixed hyperlipidemia: Secondary | ICD-10-CM

## 2019-05-28 DIAGNOSIS — Z23 Encounter for immunization: Secondary | ICD-10-CM | POA: Diagnosis not present

## 2019-05-28 DIAGNOSIS — E079 Disorder of thyroid, unspecified: Secondary | ICD-10-CM

## 2019-05-28 DIAGNOSIS — Z0000 Encounter for general adult medical examination without abnormal findings: Secondary | ICD-10-CM | POA: Diagnosis not present

## 2019-05-28 DIAGNOSIS — H60393 Other infective otitis externa, bilateral: Secondary | ICD-10-CM

## 2019-05-28 LAB — UA/M W/RFLX CULTURE, ROUTINE
Bilirubin, UA: NEGATIVE
Glucose, UA: NEGATIVE
Ketones, UA: NEGATIVE
Leukocytes,UA: NEGATIVE
Nitrite, UA: NEGATIVE
Protein,UA: NEGATIVE
RBC, UA: NEGATIVE
Specific Gravity, UA: 1.01 (ref 1.005–1.030)
Urobilinogen, Ur: 0.2 mg/dL (ref 0.2–1.0)
pH, UA: 6 (ref 5.0–7.5)

## 2019-05-28 LAB — MICROALBUMIN, URINE WAIVED
Creatinine, Urine Waived: 50 mg/dL (ref 10–300)
Microalb, Ur Waived: 10 mg/L (ref 0–19)

## 2019-05-28 MED ORDER — AMLODIPINE BESYLATE 2.5 MG PO TABS: 2.5000 mg | ORAL_TABLET | Freq: Every day | ORAL | 1 refills | Status: DC

## 2019-05-28 MED ORDER — ROSUVASTATIN CALCIUM 20 MG PO TABS: 20.0000 mg | ORAL_TABLET | Freq: Every day | ORAL | 1 refills | Status: DC

## 2019-05-28 MED ORDER — CIPRODEX 0.3-0.1 % OT SUSP: 4.0000 [drp] | Freq: Two times a day (BID) | OTIC | 1 refills | Status: DC

## 2019-05-28 NOTE — Patient Instructions (Signed)
Marcus Wright , Thank you for taking time to come for your Medicare Wellness Visit. I appreciate your ongoing commitment to your health goals. Please review the following plan we discussed and let me know if I can assist you in the future.   Screening recommendations/referrals: Colonoscopy: completed 2017 Recommended yearly ophthalmology/optometry visit for glaucoma screening and checkup Recommended yearly dental visit for hygiene and checkup  Vaccinations: Influenza vaccine: done today Pneumococcal vaccine: not inidcated Tdap vaccine: up to date Shingles vaccine: not indicated    Advanced directives: Advance directive discussed with you today. I have provided a copy for you to complete at home and have notarized. Once this is complete please bring a copy in to our office so we can scan it into your chart.  Conditions/risks identified: fall prevention discussed  Next appointment: Follow up in one year for your annual wellness visit   Preventive Care 40-64 Years, Male Preventive care refers to lifestyle choices and visits with your health care provider that can promote health and wellness. What does preventive care include?  A yearly physical exam. This is also called an annual well check.  Dental exams once or twice a year.  Routine eye exams. Ask your health care provider how often you should have your eyes checked.  Personal lifestyle choices, including:  Daily care of your teeth and gums.  Regular physical activity.  Eating a healthy diet.  Avoiding tobacco and drug use.  Limiting alcohol use.  Practicing safe sex.  Taking low-dose aspirin every day starting at age 74. What happens during an annual well check? The services and screenings done by your health care provider during your annual well check will depend on your age, overall health, lifestyle risk factors, and family history of disease. Counseling  Your health care provider may ask you questions about your:   Alcohol use.  Tobacco use.  Drug use.  Emotional well-being.  Home and relationship well-being.  Sexual activity.  Eating habits.  Work and work Statistician. Screening  You may have the following tests or measurements:  Height, weight, and BMI.  Blood pressure.  Lipid and cholesterol levels. These may be checked every 5 years, or more frequently if you are over 54 years old.  Skin check.  Lung cancer screening. You may have this screening every year starting at age 51 if you have a 30-pack-year history of smoking and currently smoke or have quit within the past 15 years.  Fecal occult blood test (FOBT) of the stool. You may have this test every year starting at age 9.  Flexible sigmoidoscopy or colonoscopy. You may have a sigmoidoscopy every 5 years or a colonoscopy every 10 years starting at age 23.  Prostate cancer screening. Recommendations will vary depending on your family history and other risks.  Hepatitis C blood test.  Hepatitis B blood test.  Sexually transmitted disease (STD) testing.  Diabetes screening. This is done by checking your blood sugar (glucose) after you have not eaten for a while (fasting). You may have this done every 1-3 years. Discuss your test results, treatment options, and if necessary, the need for more tests with your health care provider. Vaccines  Your health care provider may recommend certain vaccines, such as:  Influenza vaccine. This is recommended every year.  Tetanus, diphtheria, and acellular pertussis (Tdap, Td) vaccine. You may need a Td booster every 10 years.  Zoster vaccine. You may need this after age 42.  Pneumococcal 13-valent conjugate (PCV13) vaccine. You may need this  if you have certain conditions and have not been vaccinated.  Pneumococcal polysaccharide (PPSV23) vaccine. You may need one or two doses if you smoke cigarettes or if you have certain conditions. Talk to your health care provider about which  screenings and vaccines you need and how often you need them. This information is not intended to replace advice given to you by your health care provider. Make sure you discuss any questions you have with your health care provider. Document Released: 10/10/2015 Document Revised: 06/02/2016 Document Reviewed: 07/15/2015 Elsevier Interactive Patient Education  2017 [place] Prevention in the Home Falls can cause injuries. They can happen to people of all ages. There are many things you can do to make your home safe and to help prevent falls. What can I do on the outside of my home?  Regularly fix the edges of walkways and driveways and fix any cracks.  Remove anything that might make you trip as you walk through a door, such as a raised step or threshold.  Trim any bushes or trees on the path to your home.  Use bright outdoor lighting.  Clear any walking paths of anything that might make someone trip, such as rocks or tools.  Regularly check to see if handrails are loose or broken. Make sure that both sides of any steps have handrails.  Any raised decks and porches should have guardrails on the edges.  Have any leaves, snow, or ice cleared regularly.  Use sand or salt on walking paths during winter.  Clean up any spills in your garage right away. This includes oil or grease spills. What can I do in the bathroom?  Use night lights.  Install grab bars by the toilet and in the tub and shower. Do not use towel bars as grab bars.  Use non-skid mats or decals in the tub or shower.  If you need to sit down in the shower, use a plastic, non-slip stool.  Keep the floor dry. Clean up any water that spills on the floor as soon as it happens.  Remove soap buildup in the tub or shower regularly.  Attach bath mats securely with double-sided non-slip rug tape.  Do not have throw rugs and other things on the floor that can make you trip. What can I do in the bedroom?  Use  night lights.  Make sure that you have a light by your bed that is easy to reach.  Do not use any sheets or blankets that are too big for your bed. They should not hang down onto the floor.  Have a firm chair that has side arms. You can use this for support while you get dressed.  Do not have throw rugs and other things on the floor that can make you trip. What can I do in the kitchen?  Clean up any spills right away.  Avoid walking on wet floors.  Keep items that you use a lot in easy-to-reach places.  If you need to reach something above you, use a strong step stool that has a grab bar.  Keep electrical cords out of the way.  Do not use floor polish or wax that makes floors slippery. If you must use wax, use non-skid floor wax.  Do not have throw rugs and other things on the floor that can make you trip. What can I do with my stairs?  Do not leave any items on the stairs.  Make sure that there are handrails on  both sides of the stairs and use them. Fix handrails that are broken or loose. Make sure that handrails are as long as the stairways.  Check any carpeting to make sure that it is firmly attached to the stairs. Fix any carpet that is loose or worn.  Avoid having throw rugs at the top or bottom of the stairs. If you do have throw rugs, attach them to the floor with carpet tape.  Make sure that you have a light switch at the top of the stairs and the bottom of the stairs. If you do not have them, ask someone to add them for you. What else can I do to help prevent falls?  Wear shoes that:  Do not have high heels.  Have rubber bottoms.  Are comfortable and fit you well.  Are closed at the toe. Do not wear sandals.  If you use a stepladder:  Make sure that it is fully opened. Do not climb a closed stepladder.  Make sure that both sides of the stepladder are locked into place.  Ask someone to hold it for you, if possible.  Clearly mark and make sure that you  can see:  Any grab bars or handrails.  First and last steps.  Where the edge of each step is.  Use tools that help you move around (mobility aids) if they are needed. These include:  Canes.  Walkers.  Scooters.  Crutches.  Turn on the lights when you go into a dark area. Replace any light bulbs as soon as they burn out.  Set up your furniture so you have a clear path. Avoid moving your furniture around.  If any of your floors are uneven, fix them.  If there are any pets around you, be aware of where they are.  Review your medicines with your doctor. Some medicines can make you feel dizzy. This can increase your chance of falling. Ask your doctor what other things that you can do to help prevent falls. This information is not intended to replace advice given to you by your health care provider. Make sure you discuss any questions you have with your health care provider. Document Released: 07/10/2009 Document Revised: 02/19/2016 Document Reviewed: 10/18/2014 Elsevier Interactive Patient Education  2017 [employer].

## 2019-05-28 NOTE — Assessment & Plan Note (Signed)
Labs drawn today. Await results. Treat as needed. Call with any concerns.   

## 2019-05-28 NOTE — Assessment & Plan Note (Signed)
Under good control on current regimen. Continue current regimen. Continue to monitor. Call with any concerns. Refills given. Labs drawn today.   

## 2019-05-28 NOTE — Patient Instructions (Signed)

## 2019-05-28 NOTE — Progress Notes (Signed)
BP 112/68    Pulse 90    Temp 97.7 F (36.5 C) (Oral)    Ht 6\' 1"  (1.854 m)    Wt 171 lb 5 oz (77.7 kg)    SpO2 96%    BMI 22.60 kg/m    Subjective:    Patient ID: Marcus Wright, male    DOB: 1955/12/18, 63 y.o.   MRN: [id number]  HPI: Marcus Wright is a 63 y.o. male presenting on 05/28/2019 for comprehensive medical examination. Current medical complaints include:  HYPERTENSION / HYPERLIPIDEMIA Satisfied with current treatment? yes Duration of hypertension: chronic BP monitoring frequency: not checking BP medication side effects: no Past BP meds: amlodipine Duration of hyperlipidemia: chronic Cholesterol medication side effects: no Cholesterol supplements: fish oil Past cholesterol medications: crestor Medication compliance: excellent compliance Aspirin: no Recent stressors: yes Recurrent headaches: no Visual changes: no Palpitations: no Dyspnea: no Chest pain: no Lower extremity edema: no Dizzy/lightheaded: no  HYPOTHYROIDISM Thyroid control status:controlled Satisfied with current treatment? yes Medication side effects: yes Medication compliance: excellent compliance Etiology of hypothyroidism:  Recent dose adjustment:no Fatigue: no Cold intolerance: no Heat intolerance: no Weight gain: no Weight loss: no Constipation: no Diarrhea/loose stools: no Palpitations: no Lower extremity edema: no Anxiety/depressed mood: no  He currently lives with: wife Interim Problems from his last visit: no  Functional Status Survey:    FALL RISK: Fall Risk  05/28/2019 05/22/2018 12/20/2016  Falls in the past year? 1 Yes No  Number falls in past yr: 0 2 or more -  Injury with Fall? 1 Yes -    Depression Screen Depression screen [facility] 2/9 05/28/2019 05/22/2018 12/20/2016  Decreased Interest 0 3 0  Down, Depressed, Hopeless 0 0 0  PHQ - 2 Score 0 3 0  Altered sleeping - 1 -  Tired, decreased energy - 0 -  Change in appetite - 0 -  Feeling bad or failure about  yourself  - 0 -  Trouble concentrating - 0 -  Moving slowly or fidgety/restless - 0 -  Suicidal thoughts - 0 -  PHQ-9 Score - 4 -  Difficult doing work/chores - Not difficult at all -    Advanced Directives <no information>  Past Medical History:  Past Medical History:  Diagnosis Date   Cancer ([place]) 2003   skin'   Hypertension    Thyroid disorder    Wears dentures    full upper   Wears hearing aid     Surgical History:  Past Surgical History:  Procedure Laterality Date   BACK SURGERY  2011   BREAST SURGERY Right 11-22-12   CHOLECYSTECTOMY     COLONOSCOPY     COLONOSCOPY WITH PROPOFOL N/A 07/19/2016   Procedure: COLONOSCOPY WITH PROPOFOL;  Surgeon: Lucilla Lame, MD;  Location: [place];  Service: Endoscopy;  Laterality: N/A;   REPLACEMENT TOTAL KNEE Right 1993   SINUS SURGERY WITH INSTATRAK  2003   cancer    Medications:  Current Outpatient Medications on File Prior to Visit  Medication Sig   levothyroxine (SYNTHROID, LEVOTHROID) 50 MCG tablet TAKE 1 TABLET (50 MCG TOTAL) BY MOUTH DAILY.   No current facility-administered medications on file prior to visit.     Allergies:  Allergies  Allergen Reactions   Doxazosin Other (See Comments)    Unknown     Social History:  Social History   Socioeconomic History   Marital status: Married    Spouse name: Not on file   Number of children: Not  on file   Years of education: Not on file   Highest education level: Not on file  Occupational History   Not on file  Social Needs   Financial resource strain: Not very hard   Food insecurity    Worry: Never true    Inability: Never true   Transportation needs    Medical: No    Non-medical: No  Tobacco Use   Smoking status: Former Smoker   Smokeless tobacco: Never Used   Tobacco comment: quit 20+ yrs ago  Substance and Sexual Activity   Alcohol use: Yes    Alcohol/week: 6.0 standard drinks    Types: 6 Cans of beer per week     Comment: on occasion, 1-2 beers or wine occ.    Drug use: No   Sexual activity: Not on file  Lifestyle   Physical activity    Days per week: Not on file    Minutes per session: Not on file   Stress: Not on file  Relationships   Social connections    Talks on phone: Not on file    Gets together: Not on file    Attends religious service: Not on file    Active member of club or organization: Not on file    Attends meetings of clubs or organizations: Not on file    Relationship status: Not on file   Intimate partner violence    Fear of current or ex partner: Not on file    Emotionally abused: Not on file    Physically abused: Not on file    Forced sexual activity: Not on file  Other Topics Concern   Not on file  Social History Narrative   Not on file   Social History   Tobacco Use  Smoking Status Former Smoker  Smokeless Tobacco Never Used  Tobacco Comment   quit 20+ yrs ago   Social History   Substance and Sexual Activity  Alcohol Use Yes   Alcohol/week: 6.0 standard drinks   Types: 6 Cans of beer per week   Comment: on occasion, 1-2 beers or wine occ.     Family History:  No family history on file.  Past medical history, surgical history, medications, allergies, family history and social history reviewed with patient today and changes made to appropriate areas of the chart.   Review of Systems  Constitutional: Negative.   HENT: Positive for ear discharge and hearing loss. Negative for congestion, ear pain, nosebleeds, sinus pain, sore throat and tinnitus.        Drainage out of L ear  Eyes: Negative.   Respiratory: Negative.  Negative for stridor.   Cardiovascular: Negative.   Gastrointestinal: Positive for constipation. Negative for abdominal pain, blood in stool, diarrhea, heartburn, melena, nausea and vomiting.  Genitourinary: Negative.   Musculoskeletal: Negative.   Skin: Negative.   Neurological: Negative.   Endo/Heme/Allergies: Negative.     Psychiatric/Behavioral: Negative.     All other ROS negative except what is listed above and in the HPI.      Objective:    BP 112/68    Pulse 90    Temp 97.7 F (36.5 C) (Oral)    Ht 6\' 1"  (1.854 m)    Wt 171 lb 5 oz (77.7 kg)    SpO2 96%    BMI 22.60 kg/m   Wt Readings from Last 3 Encounters:  05/28/19 171 lb 5 oz (77.7 kg)  05/28/19 171 lb 8 oz (77.8 kg)  11/22/18 186 lb  5 oz (84.5 kg)    Physical Exam Vitals signs and nursing note reviewed.  Constitutional:      General: He is not in acute distress.    Appearance: Normal appearance. He is obese. He is not ill-appearing, toxic-appearing or diaphoretic.  HENT:     Head: Normocephalic and atraumatic.     Right Ear: Tympanic membrane and external ear normal. There is no impacted cerumen.     Left Ear: Tympanic membrane and external ear normal. There is no impacted cerumen.     Ears:     Comments: Profoundly hard of hearing, drainage of pus from both EAC, blood in the R    Nose: Nose normal. No congestion or rhinorrhea.     Mouth/Throat:     Mouth: Mucous membranes are moist.     Pharynx: Oropharynx is clear. No oropharyngeal exudate or posterior oropharyngeal erythema.  Eyes:     General: No scleral icterus.       Right eye: No discharge.        Left eye: No discharge.     Extraocular Movements: Extraocular movements intact.     Conjunctiva/sclera: Conjunctivae normal.     Pupils: Pupils are equal, round, and reactive to light.  Neck:     Musculoskeletal: Normal range of motion and neck supple. No neck rigidity or muscular tenderness.     Vascular: No carotid bruit.  Cardiovascular:     Rate and Rhythm: Normal rate and regular rhythm.     Pulses: Normal pulses.     Heart sounds: No murmur. No friction rub. No gallop.   Pulmonary:     Effort: Pulmonary effort is normal. No respiratory distress.     Breath sounds: Normal breath sounds. No stridor. No wheezing, rhonchi or rales.  Chest:     Chest wall: No tenderness.   Abdominal:     General: Abdomen is flat. Bowel sounds are normal. There is no distension.     Palpations: Abdomen is soft. There is no mass.     Tenderness: There is no abdominal tenderness. There is no right CVA tenderness, left CVA tenderness, guarding or rebound.     Hernia: No hernia is present.  Genitourinary:    Comments: Genital exam deferred with shared decision making Musculoskeletal:        General: No swelling, tenderness, deformity or signs of injury.     Right lower leg: No edema.     Left lower leg: No edema.  Lymphadenopathy:     Cervical: No cervical adenopathy.  Skin:    General: Skin is warm and dry.     Capillary Refill: Capillary refill takes less than 2 seconds.     Coloration: Skin is not jaundiced or pale.     Findings: No bruising, erythema, lesion or rash.  Neurological:     General: No focal deficit present.     Mental Status: He is alert and oriented to person, place, and time.     Cranial Nerves: No cranial nerve deficit.     Sensory: No sensory deficit.     Motor: No weakness.     Coordination: Coordination normal.     Gait: Gait normal.     Deep Tendon Reflexes: Reflexes normal.  Psychiatric:        Mood and Affect: Mood normal.        Behavior: Behavior normal.        Thought Content: Thought content normal.        Judgment:  Judgment normal.     Results for orders placed or performed during the hospital encounter of 04/26/19  Protime-INR  Result Value Ref Range   Prothrombin Time 13.4 11.4 - 15.2 seconds   INR 1.0 0.8 - 1.2  APTT  Result Value Ref Range   aPTT 30 24 - 36 seconds  CBC  Result Value Ref Range   WBC 7.7 4.0 - 10.5 K/uL   RBC 4.10 (L) 4.22 - 5.81 MIL/uL   Hemoglobin 11.8 (L) 13.0 - 17.0 g/dL   HCT 34.3 (L) 39.0 - 52.0 %   MCV 83.7 80.0 - 100.0 fL   MCH 28.8 26.0 - 34.0 pg   MCHC 34.4 30.0 - 36.0 g/dL   RDW 12.5 11.5 - 15.5 %   Platelets 227 150 - 400 K/uL   nRBC 0.0 0.0 - 0.2 %  Differential  Result Value Ref Range    Neutrophils Relative % 60 %   Neutro Abs 4.6 1.7 - 7.7 K/uL   Lymphocytes Relative 29 %   Lymphs Abs 2.2 0.7 - 4.0 K/uL   Monocytes Relative 7 %   Monocytes Absolute 0.6 0.1 - 1.0 K/uL   Eosinophils Relative 3 %   Eosinophils Absolute 0.2 0.0 - 0.5 K/uL   Basophils Relative 1 %   Basophils Absolute 0.1 0.0 - 0.1 K/uL   Immature Granulocytes 0 %   Abs Immature Granulocytes 0.01 0.00 - 0.07 K/uL  Comprehensive metabolic panel  Result Value Ref Range   Sodium 136 135 - 145 mmol/L   Potassium 3.3 (L) 3.5 - 5.1 mmol/L   Chloride 101 98 - 111 mmol/L   CO2 27 22 - 32 mmol/L   Glucose, Bld 103 (H) 70 - 99 mg/dL   BUN 10 8 - 23 mg/dL   Creatinine, Ser 0.76 0.61 - 1.24 mg/dL   Calcium 9.5 8.9 - 10.3 mg/dL   Total Protein 7.5 6.5 - 8.1 g/dL   Albumin 4.9 3.5 - 5.0 g/dL   AST 20 15 - 41 U/L   ALT 13 0 - 44 U/L   Alkaline Phosphatase 63 38 - 126 U/L   Total Bilirubin 0.9 0.3 - 1.2 mg/dL   GFR calc non Af Amer >60 >60 mL/min   GFR calc Af Amer >60 >60 mL/min   Anion gap 8 5 - 15      Assessment & Plan:   Problem List Items Addressed This Visit      Cardiovascular and Mediastinum   Hypertension    Under good control on current regimen. Continue current regimen. Continue to monitor. Call with any concerns. Refills given. Labs drawn today.       Relevant Medications   amLODipine (NORVASC) 2.5 MG tablet   rosuvastatin (CRESTOR) 20 MG tablet   Other Relevant Orders   CBC with Differential/Platelet   Comprehensive metabolic panel   Microalbumin, Urine Waived   TSH   UA/M w/rflx Culture, Routine     Endocrine   Thyroid disorder    Labs drawn today. Await results. Treat as needed. Call with any concerns.       Relevant Orders   CBC with Differential/Platelet   Comprehensive metabolic panel   TSH   UA/M w/rflx Culture, Routine     Nervous and Auditory   Hearing loss   Relevant Orders   CBC with Differential/Platelet   Comprehensive metabolic panel   TSH   UA/M w/rflx  Culture, Routine     Other   Hyperlipidemia  Under good control on current regimen. Continue current regimen. Continue to monitor. Call with any concerns. Refills given. Labs drawn today.       Relevant Medications   amLODipine (NORVASC) 2.5 MG tablet   rosuvastatin (CRESTOR) 20 MG tablet   Other Relevant Orders   CBC with Differential/Platelet   Comprehensive metabolic panel   Lipid Panel w/o Chol/HDL Ratio   TSH   UA/M w/rflx Culture, Routine    Other Visit Diagnoses    Routine general medical examination at a health care facility    -  Primary   Vaccines up to date. Screening labs checked today. Colonoscopy up to date. Continue diet and exercise. Call with any concerns.    Screening for prostate cancer       Labs drawn today. Await results.    Relevant Orders   PSA   Acute infective otitis externa, bilateral       Will treat with ciprodex. Call with any concerns.        Preventative Services:  Health Risk Assessment and Personalized Prevention Plan: Done today Bone Mass Measurements: N/A CVD Screening: Done today Colon Cancer Screening: Up to date Depression Screening: Done today Diabetes Screening: Done today Glaucoma Screening: See your eye doctor Hepatitis B vaccine: N/A Hepatitis C screening: Up to date HIV Screening: Up to date Flu Vaccine: Lung cancer Screening: N/A Obesity Screening: Done today Pneumonia Vaccines (2): N/A STI Screening: N/A PSA screening: Done today  Discussed aspirin prophylaxis for myocardial infarction prevention and decision was made to start ASA  LABORATORY TESTING:  Health maintenance labs ordered today as discussed above.   The natural history of prostate cancer and ongoing controversy regarding screening and potential treatment outcomes of prostate cancer has been discussed with the patient. The meaning of a false positive PSA and a false negative PSA has been discussed. He indicates understanding of the limitations of this  screening test and wishes to proceed with screening PSA testing.   IMMUNIZATIONS:   - Tdap: Tetanus vaccination status reviewed: last tetanus booster within 10 years. - Influenza: Administered today - Pneumovax: Not applicable - Prevnar: Not applicable - Zostavax vaccine: Given elsewhere  SCREENING: - Colonoscopy: Up to date  Discussed with patient purpose of the colonoscopy is to detect colon cancer at curable precancerous or early stages   PATIENT COUNSELING:    Sexuality: Discussed sexually transmitted diseases, partner selection, use of condoms, avoidance of unintended pregnancy  and contraceptive alternatives.   Advised to avoid cigarette smoking.  I discussed with the patient that most people either abstain from alcohol or drink within safe limits (<=14/week and <=4 drinks/occasion for males, <=7/weeks and <= 3 drinks/occasion for females) and that the risk for alcohol disorders and other health effects rises proportionally with the number of drinks per week and how often a drinker exceeds daily limits.  Discussed cessation/primary prevention of drug use and availability of treatment for abuse.   Diet: Encouraged to adjust caloric intake to maintain  or achieve ideal body weight, to reduce intake of dietary saturated fat and total fat, to limit sodium intake by avoiding high sodium foods and not adding table salt, and to maintain adequate dietary potassium and calcium preferably from fresh fruits, vegetables, and low-fat dairy products.    stressed the importance of regular exercise  Injury prevention: Discussed safety belts, safety helmets, smoke detector, smoking near bedding or upholstery.   Dental health: Discussed importance of regular tooth brushing, flossing, and dental visits.   Follow up  plan: NEXT PREVENTATIVE PHYSICAL DUE IN 1 YEAR. Return in about 6 months (around 11/25/2019) for 6 month follow up.

## 2019-05-28 NOTE — Progress Notes (Signed)
Subjective:   Marcus Wright is a 63 y.o. male who presents for Medicare Annual/Subsequent preventive examination.  Review of Systems:   Cardiac Risk Factors include: advanced age (>32men, >14 women);male gender;dyslipidemia;hypertension     Objective:    Vitals: BP 112/68 (BP Location: Right Arm, Patient Position: Sitting, Cuff Size: Normal)   Pulse 90   Temp 97.7 F (36.5 C) (Temporal)   Resp 16   Ht 6' (1.829 m)   Wt 171 lb 8 oz (77.8 kg)   SpO2 96%   BMI 23.26 kg/m   Body mass index is 23.26 kg/m.  Advanced Directives 05/28/2019 04/26/2019 07/19/2018 02/26/2017 12/20/2016 10/29/2016 10/07/2016  Does Patient Have a Medical Advance Directive? No No No No No No No  Would patient like information on creating a medical advance directive? Yes (MAU/Ambulatory/Procedural Areas - Information given) - No - Patient declined No - Patient declined Yes (MAU/Ambulatory/Procedural Areas - Information given) No - Patient declined No - Patient declined    Tobacco Social History   Tobacco Use  Smoking Status Former Smoker  Smokeless Tobacco Never Used  Tobacco Comment   quit 20+ yrs ago     Counseling given: Not Answered Comment: quit 20+ yrs ago   Clinical Intake:  Pre-visit preparation completed: Yes  Pain : No/denies pain     Nutritional Status: BMI of 19-24  Normal Nutritional Risks: None Diabetes: No  How often do you need to have someone help you when you read instructions, pamphlets, or other written materials from your doctor or pharmacy?: 1 - Never  Interpreter Needed?: No  Information entered by ::  ,LPN  Past Medical History:  Diagnosis Date  . Cancer [facility]) 2003   skin'  . Hypertension   . Thyroid disorder   . Wears dentures    full upper  . Wears hearing aid    Past Surgical History:  Procedure Laterality Date  . BACK SURGERY  2011  . BREAST SURGERY Right 11-22-12  . CHOLECYSTECTOMY    . COLONOSCOPY    . COLONOSCOPY WITH PROPOFOL N/A  07/19/2016   Procedure: COLONOSCOPY WITH PROPOFOL;  Surgeon: Lucilla Lame, MD;  Location: [place];  Service: Endoscopy;  Laterality: N/A;  . REPLACEMENT TOTAL KNEE Right 1993  . SINUS SURGERY WITH INSTATRAK  2003   cancer   History reviewed. No pertinent family history. Social History   Socioeconomic History  . Marital status: Married    Spouse name: Not on file  . Number of children: Not on file  . Years of education: Not on file  . Highest education level: Not on file  Occupational History  . Not on file  Social Needs  . Financial resource strain: Not very hard  . Food insecurity    Worry: Never true    Inability: Never true  . Transportation needs    Medical: No    Non-medical: No  Tobacco Use  . Smoking status: Former Research scientist (life sciences)  . Smokeless tobacco: Never Used  . Tobacco comment: quit 20+ yrs ago  Substance and Sexual Activity  . Alcohol use: Yes    Alcohol/week: 6.0 standard drinks    Types: 6 Cans of beer per week    Comment: on occasion, 1-2 beers or wine occ.   . Drug use: No  . Sexual activity: Not on file  Lifestyle  . Physical activity    Days per week: Not on file    Minutes per session: Not on file  . Stress: Not  on file  Relationships  . Social Herbalist on phone: Not on file    Gets together: Not on file    Attends religious service: Not on file    Active member of club or organization: Not on file    Attends meetings of clubs or organizations: Not on file    Relationship status: Not on file  Other Topics Concern  . Not on file  Social History Narrative  . Not on file    Outpatient Encounter Medications as of 05/28/2019  Medication Sig  . amLODipine (NORVASC) 2.5 MG tablet Take 1 tablet (2.5 mg total) by mouth daily.  Marland Kitchen levothyroxine (SYNTHROID, LEVOTHROID) 50 MCG tablet TAKE 1 TABLET (50 MCG TOTAL) BY MOUTH DAILY.  . rosuvastatin (CRESTOR) 20 MG tablet Take 1 tablet (20 mg total) by mouth daily.  . [DISCONTINUED] traMADol  (ULTRAM) 50 MG tablet Take 1 tablet (50 mg total) by mouth every 6 (six) hours as needed. (Patient not taking: Reported on 05/28/2019)   No facility-administered encounter medications on file as of 05/28/2019.     Activities of Daily Living In your present state of health, do you have any difficulty performing the following activities: 05/28/2019  Hearing? Y  Comment biltaeral hearing aids  Vision? Y  Comment reading glasses, goes to  eye center  Difficulty concentrating or making decisions? Y  Comment problems with spelling since radiation  Walking or climbing stairs? Y  Comment some due to balance  Dressing or bathing? N  Doing errands, shopping? N  Preparing Food and eating ? N  Using the Toilet? N  In the past six months, have you accidently leaked urine? N  Do you have problems with loss of bowel control? N  Managing your Medications? N  Managing your Finances? N  Housekeeping or managing your Housekeeping? N  Some recent data might be hidden    Patient Care Team: Valerie Roys, DO as PCP - General (Family Medicine) Clyde Canterbury, MD as Referring Physician (Otolaryngology)   Assessment:   This is a routine wellness examination for Marcus Wright.  Exercise Activities and Dietary recommendations Current Exercise Habits: The patient does not participate in regular exercise at present(completing PT), Exercise limited by: None identified  Goals   None     Fall Risk: Fall Risk  05/28/2019 05/22/2018 12/20/2016  Falls in the past year? 1 Yes No  Number falls in past yr: 0 2 or more -  Injury with Fall? 1 Yes -    FALL RISK PREVENTION PERTAINING TO THE HOME:  Any stairs in or around the home? Yes  If so, are there any without handrails? No   Home free of loose throw rugs in walkways, pet beds, electrical cords, etc? Yes  Adequate lighting in your home to reduce risk of falls? Yes   ASSISTIVE DEVICES UTILIZED TO PREVENT FALLS:  Life alert? No  Use of a cane,  walker or w/c? No  Grab bars in the bathroom? No  Shower chair or bench in shower? No  Elevated toilet seat or a handicapped toilet? No   TIMED UP AND GO:  Was the test performed? Yes .  Length of time to ambulate 10 feet: 11 sec.   GAIT:  Appearance of gait: Gait steady and fast without the use of an assistive device.  Intervention(s) required? No  DME/home health order needed?  No   Depression Screen PHQ 2/9 Scores 05/28/2019 05/22/2018 12/20/2016  PHQ - 2 Score 0 3  0  PHQ- 9 Score - 4 -    Cognitive Function     6CIT Screen 05/22/2018 12/20/2016  What Year? 4 points 0 points  What month? 0 points 0 points  What time? 0 points 0 points  Count back from 20 0 points 0 points  Months in reverse 2 points 2 points  Repeat phrase 2 points 4 points  Total Score 8 6    Immunization History  Administered Date(s) Administered  . Fluad Quad(high Dose 65+) 05/28/2019  . Influenza,inj,Quad PF,6+ Mos 11/06/2015, 06/21/2016, 06/22/2017, 05/22/2018  . Tdap 05/21/2016    Qualifies for Shingles Vaccine? No   Tdap: up to date   Flu Vaccine: done today  Pneumococcal Vaccine: not indicated   Screening Tests Health Maintenance  Topic Date Due  . INFLUENZA VACCINE  04/28/2019  . TETANUS/TDAP  05/21/2026  . COLONOSCOPY  07/19/2026  . Hepatitis C Screening  Completed  . HIV Screening  Completed   Cancer Screenings:  Colorectal Screening: Completed 07/19/2016 . Repeat every 10 years  Lung Cancer Screening: (Low Dose CT Chest recommended if Age 53-80 years, 30 pack-year currently smoking OR have quit w/in 15years.) does not qualify.     Additional Screening:  Hepatitis C Screening: does qualify; Completed 12/20/2016  Vision Screening: Recommended annual ophthalmology exams for early detection of glaucoma and other disorders of the eye. Is the patient up to date with their annual eye exam?  Yes  Who is the provider or what is the name of the office in which the pt attends  annual eye exams? [facility]    Dental Screening: Recommended annual dental exams for proper oral hygiene  Community Resource Referral:  CRR required this visit?  No        Plan:  I have personally reviewed and addressed the Medicare Annual Wellness questionnaire and have noted the following in the patient's chart:  A. Medical and social history B. Use of alcohol, tobacco or illicit drugs  C. Current medications and supplements D. Functional ability and status E.  Nutritional status F.  Physical activity G. Advance directives H. List of other physicians I.  Hospitalizations, surgeries, and ER visits in previous 12 months J.  [place] such as hearing and vision if needed, cognitive and depression L. Referrals and appointments   In addition, I have reviewed and discussed with patient certain preventive protocols, quality metrics, and best practice recommendations. A written personalized care plan for preventive services as well as general preventive health recommendations were provided to patient.   Signed,   Bevelyn Ngo, LPN  [id number] Nurse Health Advisor   Nurse Notes: none

## 2019-05-29 ENCOUNTER — Encounter: Payer: Self-pay | Admitting: Family Medicine

## 2019-05-29 LAB — LIPID PANEL W/O CHOL/HDL RATIO
Cholesterol, Total: 140 mg/dL (ref 100–199)
HDL: 43 mg/dL (ref >39)
LDL Chol Calc (NIH): 78 mg/dL (ref 0–99)
Triglycerides: 101 mg/dL (ref 0–149)
VLDL Cholesterol Cal: 19 mg/dL (ref 5–40)

## 2019-05-29 LAB — CBC WITH DIFFERENTIAL/PLATELET
Basophils Absolute: 0.1 10*3/uL (ref 0.0–0.2)
Basos: 1 %
EOS (ABSOLUTE): 0.3 10*3/uL (ref 0.0–0.4)
Eos: 7 %
Hematocrit: 36 % — ABNORMAL LOW (ref 37.5–51.0)
Hemoglobin: 12.3 g/dL — ABNORMAL LOW (ref 13.0–17.7)
Immature Grans (Abs): 0 10*3/uL (ref 0.0–0.1)
Immature Granulocytes: 0 %
Lymphocytes Absolute: 2.4 10*3/uL (ref 0.7–3.1)
Lymphs: 47 %
MCH: 29.2 pg (ref 26.6–33.0)
MCHC: 34.2 g/dL (ref 31.5–35.7)
MCV: 86 fL (ref 79–97)
Monocytes Absolute: 0.5 10*3/uL (ref 0.1–0.9)
Monocytes: 9 %
Neutrophils Absolute: 1.9 10*3/uL (ref 1.4–7.0)
Neutrophils: 36 %
Platelets: 212 10*3/uL (ref 150–450)
RBC: 4.21 x10E6/uL (ref 4.14–5.80)
RDW: 13 % (ref 11.6–15.4)
WBC: 5.2 10*3/uL (ref 3.4–10.8)

## 2019-05-29 LAB — COMPREHENSIVE METABOLIC PANEL
ALT: 16 IU/L (ref 0–44)
AST: 17 IU/L (ref 0–40)
Albumin/Globulin Ratio: 2.5 — ABNORMAL HIGH (ref 1.2–2.2)
Albumin: 4.7 g/dL (ref 3.8–4.8)
Alkaline Phosphatase: 67 IU/L (ref 39–117)
BUN/Creatinine Ratio: 10 (ref 10–24)
BUN: 8 mg/dL (ref 8–27)
Bilirubin Total: 0.4 mg/dL (ref 0.0–1.2)
CO2: 28 mmol/L (ref 20–29)
Calcium: 9.5 mg/dL (ref 8.6–10.2)
Chloride: 101 mmol/L (ref 96–106)
Creatinine, Ser: 0.77 mg/dL (ref 0.76–1.27)
GFR calc Af Amer: 111 mL/min/{1.73_m2} (ref >59)
GFR calc non Af Amer: 96 mL/min/{1.73_m2} (ref >59)
Globulin, Total: 1.9 g/dL (ref 1.5–4.5)
Glucose: 76 mg/dL (ref 65–99)
Potassium: 4.2 mmol/L (ref 3.5–5.2)
Sodium: 142 mmol/L (ref 134–144)
Total Protein: 6.6 g/dL (ref 6.0–8.5)

## 2019-05-29 LAB — TSH: TSH: 0.704 u[IU]/mL (ref 0.450–4.500)

## 2019-05-29 LAB — PSA: Prostate Specific Ag, Serum: 0.4 ng/mL (ref 0.0–4.0)

## 2019-11-27 ENCOUNTER — Ambulatory Visit: Payer: Medicare Other | Admitting: Family Medicine

## 2019-11-28 ENCOUNTER — Other Ambulatory Visit: Payer: Self-pay | Admitting: Family Medicine

## 2019-11-29 ENCOUNTER — Ambulatory Visit (INDEPENDENT_AMBULATORY_CARE_PROVIDER_SITE_OTHER): Payer: Medicare Other | Admitting: Family Medicine

## 2019-11-29 ENCOUNTER — Encounter: Payer: Self-pay | Admitting: Family Medicine

## 2019-11-29 ENCOUNTER — Other Ambulatory Visit: Payer: Self-pay

## 2019-11-29 VITALS — BP 138/87 | HR 76 | Temp 98.0°F

## 2019-11-29 DIAGNOSIS — E079 Disorder of thyroid, unspecified: Secondary | ICD-10-CM

## 2019-11-29 DIAGNOSIS — I10 Essential (primary) hypertension: Secondary | ICD-10-CM | POA: Diagnosis not present

## 2019-11-29 DIAGNOSIS — E782 Mixed hyperlipidemia: Secondary | ICD-10-CM | POA: Diagnosis not present

## 2019-11-29 DIAGNOSIS — Z23 Encounter for immunization: Secondary | ICD-10-CM

## 2019-11-29 LAB — MICROALBUMIN, URINE WAIVED
Creatinine, Urine Waived: 10 mg/dL (ref 10–300)
Microalb, Ur Waived: 10 mg/L (ref 0–19)
Microalb/Creat Ratio: <30 mg/g (ref <30)

## 2019-11-29 MED ORDER — AMLODIPINE BESYLATE 2.5 MG PO TABS: 2.5000 mg | ORAL_TABLET | Freq: Every day | ORAL | 1 refills | Status: DC

## 2019-11-29 MED ORDER — MELOXICAM 15 MG PO TABS: 15.0000 mg | ORAL_TABLET | Freq: Every day | ORAL | 1 refills | Status: DC

## 2019-11-29 MED ORDER — ROSUVASTATIN CALCIUM 20 MG PO TABS: 20.0000 mg | ORAL_TABLET | Freq: Every day | ORAL | 1 refills | Status: DC

## 2019-11-29 NOTE — Assessment & Plan Note (Signed)
Rechecking labs today. Adjust dose as needed. Call with any concerns.

## 2019-11-29 NOTE — Assessment & Plan Note (Signed)
Under good control on current regimen. Continue current regimen. Continue to monitor. Call with any concerns. Refills given. Labs drawn today.   

## 2019-11-29 NOTE — Patient Instructions (Addendum)
We are recommending the vaccine to everyone who has not had an allergic reaction to any of the components of the vaccine. If you have specific questions about the vaccine, please bring them up with your health care provider to discuss them.   We will likely not be getting the vaccine in the office for the first rounds of vaccinations. The way they are releasing the vaccines is going to be through the health systems (like [facility], [facility], [facility], [facility]), through your county health department, or through the pharmacies.   The [facility] Department is giving vaccines to those 65+ and [employer] and [place] providers start 11/21/19 Call [phone] to schedule  If you are 65+ you can get a vaccine through [facility] by signing up for an appointment.  You can sign up by going to: [email or web address].  You can get more information by going to: [email or web address]  [employer] next door is giving the [employer]- you can call [phone] or stop by there to schedule. Pneumococcal Conjugate Vaccine (PCV13): What You Need to Know 1. Why get vaccinated? Pneumococcal conjugate vaccine (PCV13) can prevent pneumococcal disease. Pneumococcal disease refers to any illness caused by pneumococcal bacteria. These bacteria can cause many types of illnesses, including pneumonia, which is an infection of the lungs. Pneumococcal bacteria are one of the most common causes of pneumonia. Besides pneumonia, pneumococcal bacteria can also cause:  Ear infections  Sinus infections  Meningitis (infection of the tissue covering the brain and spinal cord)  Bacteremia (bloodstream infection) Anyone can get pneumococcal disease, but children under 32 years of age, people with certain medical conditions, adults 64 years or older, and cigarette smokers are at the highest risk. Most pneumococcal infections are mild. However, some can result in long-term problems, such  as brain damage or hearing loss. Meningitis, bacteremia, and pneumonia caused by pneumococcal disease can be fatal. 2. PCV13 PCV13 protects against 13 types of bacteria that cause pneumococcal disease. Infants and young children usually need 4 doses of pneumococcal conjugate vaccine, at 2, 4, 6, and 37-33 months of age. In some cases, a child might need fewer than 4 doses to complete PCV13 vaccination. A dose of PCV23 vaccine is also recommended for anyone 2 years or older with certain medical conditions if they did not already receive PCV13. This vaccine may be given to adults 83 years or older based on discussions between the patient and health care provider. 3. Talk with your health care provider Tell your vaccine provider if the person getting the vaccine:  Has had an allergic reaction after a previous dose of PCV13, to an earlier pneumococcal conjugate vaccine known as PCV7, or to any vaccine containing diphtheria toxoid (for example, DTaP), or has any severe, life-threatening allergies.  In some cases, your health care provider may decide to postpone PCV13 vaccination to a future visit. People with minor illnesses, such as a cold, may be vaccinated. People who are moderately or severely ill should usually wait until they recover before getting PCV13. Your health care provider can give you more information. 4. Risks of a vaccine reaction  Redness, swelling, pain, or tenderness where the shot is given, and fever, loss of appetite, fussiness (irritability), feeling tired, headache, and chills can happen after PCV13. Young children may be at increased risk for seizures caused by fever after PCV13 if it is administered at the same time as inactivated influenza vaccine. Ask your health care provider for more information. People sometimes faint after  medical procedures, including vaccination. Tell your provider if you feel dizzy or have vision changes or ringing in the ears. As with any medicine,  there is a very remote chance of a vaccine causing a severe allergic reaction, other serious injury, or death. 5. What if there is a serious problem? An allergic reaction could occur after the vaccinated person leaves the clinic. If you see signs of a severe allergic reaction (hives, swelling of the face and throat, difficulty breathing, a fast heartbeat, dizziness, or weakness), call 9-1-1 and get the person to the nearest hospital. For other signs that concern you, call your health care provider. Adverse reactions should be reported to the Vaccine Adverse Event Reporting System (VAERS). Your health care provider will usually file this report, or you can do it yourself. Visit the VAERS website at www.vaers.SamedayNews.es or call (430)599-0240. VAERS is only for reporting reactions, and VAERS staff do not give medical advice. 6. The National Vaccine Injury Compensation Program The [employer] Vaccine Injury Compensation Program (VICP) is a federal program that was created to compensate people who may have been injured by certain vaccines. Visit the VICP website at GoldCloset.com.ee or call (629)453-4078 to learn about the program and about filing a claim. There is a time limit to file a claim for compensation. 7. How can I learn more?  Ask your health care provider.  Call your local or state health department.  Contact the Centers for Disease Control and Prevention (CDC): ? Call 231-140-3022 (1-800-CDC-INFO) or ? Visit CDC's website at http://hunter.com/ Vaccine Information Statement PCV13 Vaccine (07/26/2018) This information is not intended to replace advice given to you by your health care provider. Make sure you discuss any questions you have with your health care provider. Document Revised: 01/02/2019 Document Reviewed: 04/25/2018 Elsevier Patient Education  [place].

## 2019-11-29 NOTE — Progress Notes (Signed)
BP 138/87 (BP Location: Left Arm, Patient Position: Sitting, Cuff Size: Normal)   Pulse 76   Temp 98 F (36.7 C) (Oral)   SpO2 97%    Subjective:    Patient ID: Marcus Wright, male    DOB: [birth year], 64 y.o.   MRN: [id number]  HPI: Marcus Wright is a 64 y.o. male  Chief Complaint  Patient presents with  . Hypertension  . Hyperlipidemia  . Hypothyroidism   HYPERTENSION / HYPERLIPIDEMIA Satisfied with current treatment? yes Duration of hypertension: chronic BP monitoring frequency: not checking BP medication side effects: no Past BP meds: amlodipine Duration of hyperlipidemia: chronic Cholesterol medication side effects: no Cholesterol supplements: none Past cholesterol medications: crestor Medication compliance: excellent compliance Aspirin: no Recent stressors: no Recurrent headaches: no Visual changes: no Palpitations: no Dyspnea: no Chest pain: no Lower extremity edema: no Dizzy/lightheaded: no  HYPOTHYROIDISM Thyroid control status:stable Satisfied with current treatment? yes Medication side effects: no Medication compliance: excellent compliance Recent dose adjustment:no Fatigue: no Cold intolerance: no Heat intolerance: no Weight gain: no Weight loss: no Constipation: no Diarrhea/loose stools: no Palpitations: no Lower extremity edema: no Anxiety/depressed mood: no   Relevant past medical, surgical, family and social history reviewed and updated as indicated. Interim medical history since our last visit reviewed. Allergies and medications reviewed and updated.  Review of Systems  Constitutional: Negative.   HENT: Positive for hearing loss.   Respiratory: Negative.   Cardiovascular: Negative.   Gastrointestinal: Negative.   Musculoskeletal: Negative.   Psychiatric/Behavioral: Negative.     Per HPI unless specifically indicated above     Objective:    BP 138/87 (BP Location: Left Arm, Patient Position: Sitting, Cuff Size:  Normal)   Pulse 76   Temp 98 F (36.7 C) (Oral)   SpO2 97%   Wt Readings from Last 3 Encounters:  05/28/19 171 lb 5 oz (77.7 kg)  05/28/19 171 lb 8 oz (77.8 kg)  11/22/18 186 lb 5 oz (84.5 kg)    Physical Exam Vitals and nursing note reviewed.  Constitutional:      General: He is not in acute distress.    Appearance: Normal appearance. He is not ill-appearing, toxic-appearing or diaphoretic.  HENT:     Head: Normocephalic and atraumatic.     Right Ear: External ear normal.     Left Ear: External ear normal.     Nose: Nose normal.     Mouth/Throat:     Mouth: Mucous membranes are moist.     Pharynx: Oropharynx is clear.  Eyes:     General: No scleral icterus.       Right eye: No discharge.        Left eye: No discharge.     Extraocular Movements: Extraocular movements intact.     Conjunctiva/sclera: Conjunctivae normal.     Pupils: Pupils are equal, round, and reactive to light.  Cardiovascular:     Rate and Rhythm: Normal rate and regular rhythm.     Pulses: Normal pulses.     Heart sounds: Normal heart sounds. No murmur. No friction rub. No gallop.   Pulmonary:     Effort: Pulmonary effort is normal. No respiratory distress.     Breath sounds: Normal breath sounds. No stridor. No wheezing, rhonchi or rales.  Chest:     Chest wall: No tenderness.  Musculoskeletal:        General: Normal range of motion.     Cervical back: Normal range of motion and neck  supple.  Skin:    General: Skin is warm and dry.     Capillary Refill: Capillary refill takes less than 2 seconds.     Coloration: Skin is not jaundiced or pale.     Findings: No bruising, erythema, lesion or rash.  Neurological:     General: No focal deficit present.     Mental Status: He is alert and oriented to person, place, and time. Mental status is at baseline.  Psychiatric:        Mood and Affect: Mood normal.        Behavior: Behavior normal.        Thought Content: Thought content normal.         Judgment: Judgment normal.     Results for orders placed or performed in visit on 05/28/19  CBC with Differential/Platelet  Result Value Ref Range   WBC 5.2 3.4 - 10.8 x10E3/uL   RBC 4.21 4.14 - 5.80 x10E6/uL   Hemoglobin 12.3 (L) 13.0 - 17.7 g/dL   Hematocrit 36.0 (L) 37.5 - 51.0 %   MCV 86 79 - 97 fL   MCH 29.2 26.6 - 33.0 pg   MCHC 34.2 31.5 - 35.7 g/dL   RDW 13.0 11.6 - 15.4 %   Platelets 212 150 - 450 x10E3/uL   Neutrophils 36 Not Estab. %   Lymphs 47 Not Estab. %   Monocytes 9 Not Estab. %   Eos 7 Not Estab. %   Basos 1 Not Estab. %   Neutrophils Absolute 1.9 1.4 - 7.0 x10E3/uL   Lymphocytes Absolute 2.4 0.7 - 3.1 x10E3/uL   Monocytes Absolute 0.5 0.1 - 0.9 x10E3/uL   EOS (ABSOLUTE) 0.3 0.0 - 0.4 x10E3/uL   Basophils Absolute 0.1 0.0 - 0.2 x10E3/uL   Immature Granulocytes 0 Not Estab. %   Immature Grans (Abs) 0.0 0.0 - 0.1 x10E3/uL  Comprehensive metabolic panel  Result Value Ref Range   Glucose 76 65 - 99 mg/dL   BUN 8 8 - 27 mg/dL   Creatinine, Ser 0.77 0.76 - 1.27 mg/dL   GFR calc non Af Amer 96 >59 mL/min/1.73   GFR calc Af Amer 111 >59 mL/min/1.73   BUN/Creatinine Ratio 10 10 - 24   Sodium 142 134 - 144 mmol/L   Potassium 4.2 3.5 - 5.2 mmol/L   Chloride 101 96 - 106 mmol/L   CO2 28 20 - 29 mmol/L   Calcium 9.5 8.6 - 10.2 mg/dL   Total Protein 6.6 6.0 - 8.5 g/dL   Albumin 4.7 3.8 - 4.8 g/dL   Globulin, Total 1.9 1.5 - 4.5 g/dL   Albumin/Globulin Ratio 2.5 (H) 1.2 - 2.2   Bilirubin Total 0.4 0.0 - 1.2 mg/dL   Alkaline Phosphatase 67 39 - 117 IU/L   AST 17 0 - 40 IU/L   ALT 16 0 - 44 IU/L  Lipid Panel w/o Chol/HDL Ratio  Result Value Ref Range   Cholesterol, Total 140 100 - 199 mg/dL   Triglycerides 101 0 - 149 mg/dL   HDL 43 >39 mg/dL   VLDL Cholesterol Cal 19 5 - 40 mg/dL   LDL Chol Calc (NIH) 78 0 - 99 mg/dL   Lipid Comment: CANCELED   Microalbumin, Urine Waived  Result Value Ref Range   Microalb, Ur Waived 10 0 - 19 mg/L   Creatinine, Urine  Waived 50 10 - 300 mg/dL   Microalb/Creat Ratio 30-300 (H) <30 mg/g  PSA  Result Value Ref Range   Prostate  Specific Ag, Serum 0.4 0.0 - 4.0 ng/mL  TSH  Result Value Ref Range   TSH 0.704 0.450 - 4.500 uIU/mL  UA/M w/rflx Culture, Routine   Specimen: Urine   URINE  Result Value Ref Range   Specific Gravity, UA 1.010 1.005 - 1.030   pH, UA 6.0 5.0 - 7.5   Color, UA Yellow Yellow   Appearance Ur Clear Clear   Leukocytes,UA Negative Negative   Protein,UA Negative Negative/Trace   Glucose, UA Negative Negative   Ketones, UA Negative Negative   RBC, UA Negative Negative   Bilirubin, UA Negative Negative   Urobilinogen, Ur 0.2 0.2 - 1.0 mg/dL   Nitrite, UA Negative Negative      Assessment & Plan:   Problem List Items Addressed This Visit      Cardiovascular and Mediastinum   Hypertension - Primary    Under good control on current regimen. Continue current regimen. Continue to monitor. Call with any concerns. Refills given. Labs drawn today.       Relevant Medications   rosuvastatin (CRESTOR) 20 MG tablet   amLODipine (NORVASC) 2.5 MG tablet   Other Relevant Orders   CBC with Differential/Platelet   Comprehensive metabolic panel   Microalbumin, Urine Waived     Endocrine   Thyroid disorder    Rechecking labs today. Adjust dose as needed. Call with any concerns.       Relevant Orders   CBC with Differential/Platelet   Comprehensive metabolic panel   TSH     Other   Hyperlipidemia    Under good control on current regimen. Continue current regimen. Continue to monitor. Call with any concerns. Refills given. Labs drawn today.       Relevant Medications   rosuvastatin (CRESTOR) 20 MG tablet   amLODipine (NORVASC) 2.5 MG tablet   Other Relevant Orders   CBC with Differential/Platelet   Comprehensive metabolic panel   Lipid Panel w/o Chol/HDL Ratio       Follow up plan: Return in about 6 months (around 05/31/2020) for Physical.

## 2019-11-30 LAB — COMPREHENSIVE METABOLIC PANEL
ALT: 17 IU/L (ref 0–44)
AST: 22 IU/L (ref 0–40)
Albumin/Globulin Ratio: 2.4 — ABNORMAL HIGH (ref 1.2–2.2)
Albumin: 4.8 g/dL (ref 3.8–4.8)
Alkaline Phosphatase: 71 IU/L (ref 39–117)
BUN/Creatinine Ratio: 10 (ref 10–24)
BUN: 8 mg/dL (ref 8–27)
Bilirubin Total: 0.6 mg/dL (ref 0.0–1.2)
CO2: 26 mmol/L (ref 20–29)
Calcium: 9.6 mg/dL (ref 8.6–10.2)
Chloride: 98 mmol/L (ref 96–106)
Creatinine, Ser: 0.82 mg/dL (ref 0.76–1.27)
GFR calc Af Amer: 107 mL/min/{1.73_m2} (ref >59)
GFR calc non Af Amer: 93 mL/min/{1.73_m2} (ref >59)
Globulin, Total: 2 g/dL (ref 1.5–4.5)
Glucose: 87 mg/dL (ref 65–99)
Potassium: 4.5 mmol/L (ref 3.5–5.2)
Sodium: 138 mmol/L (ref 134–144)
Total Protein: 6.8 g/dL (ref 6.0–8.5)

## 2019-11-30 LAB — CBC WITH DIFFERENTIAL/PLATELET
Basophils Absolute: 0.1 10*3/uL (ref 0.0–0.2)
Basos: 1 %
EOS (ABSOLUTE): 0.4 10*3/uL (ref 0.0–0.4)
Eos: 6 %
Hematocrit: 37.4 % — ABNORMAL LOW (ref 37.5–51.0)
Hemoglobin: 12.7 g/dL — ABNORMAL LOW (ref 13.0–17.7)
Immature Grans (Abs): 0 10*3/uL (ref 0.0–0.1)
Immature Granulocytes: 0 %
Lymphocytes Absolute: 2.6 10*3/uL (ref 0.7–3.1)
Lymphs: 40 %
MCH: 29.1 pg (ref 26.6–33.0)
MCHC: 34 g/dL (ref 31.5–35.7)
MCV: 86 fL (ref 79–97)
Monocytes Absolute: 0.7 10*3/uL (ref 0.1–0.9)
Monocytes: 11 %
Neutrophils Absolute: 2.7 10*3/uL (ref 1.4–7.0)
Neutrophils: 42 %
Platelets: 282 10*3/uL (ref 150–450)
RBC: 4.36 x10E6/uL (ref 4.14–5.80)
RDW: 13 % (ref 11.6–15.4)
WBC: 6.4 10*3/uL (ref 3.4–10.8)

## 2019-11-30 LAB — TSH: TSH: 0.842 u[IU]/mL (ref 0.450–4.500)

## 2019-11-30 LAB — LIPID PANEL W/O CHOL/HDL RATIO
Cholesterol, Total: 148 mg/dL (ref 100–199)
HDL: 40 mg/dL (ref >39)
LDL Chol Calc (NIH): 79 mg/dL (ref 0–99)
Triglycerides: 171 mg/dL — ABNORMAL HIGH (ref 0–149)
VLDL Cholesterol Cal: 29 mg/dL (ref 5–40)

## 2019-12-02 ENCOUNTER — Encounter: Payer: Self-pay | Admitting: Family Medicine

## 2020-01-17 DIAGNOSIS — H6123 Impacted cerumen, bilateral: Secondary | ICD-10-CM | POA: Diagnosis not present

## 2020-01-17 DIAGNOSIS — H6531 Chronic mucoid otitis media, right ear: Secondary | ICD-10-CM | POA: Diagnosis not present

## 2020-01-17 DIAGNOSIS — J019 Acute sinusitis, unspecified: Secondary | ICD-10-CM | POA: Diagnosis not present

## 2020-02-20 ENCOUNTER — Other Ambulatory Visit: Payer: Self-pay | Admitting: Family Medicine

## 2020-02-20 NOTE — Telephone Encounter (Signed)
Requested Prescriptions  Pending Prescriptions Disp Refills  . levothyroxine (SYNTHROID) 50 MCG tablet [Pharmacy Med Name: LEVOTHYROXINE 50 MCG TABLET] 90 tablet 0    Sig: TAKE 1 TABLET BY MOUTH EVERY DAY     Endocrinology:  Hypothyroid Agents Failed - 02/20/2020  1:19 AM      Failed - TSH needs to be rechecked within 3 months after an abnormal result. Refill until TSH is due.      Passed - TSH in normal range and within 360 days    TSH  Date Value Ref Range Status  11/29/2019 0.842 0.450 - 4.500 uIU/mL Final         Passed - Valid encounter within last 12 months    Recent Outpatient Visits          2 months ago Essential hypertension   [place], Megan P, DO   8 months ago Routine general medical examination at a health care facility   [place], [place], DO   1 year ago Essential hypertension   [place], [place], DO   1 year ago Medicare annual wellness visit, subsequent   [place], [place], DO   2 years ago Mixed hyperlipidemia   [place], [place], DO      Future Appointments            In 3 months Wynetta Emery, Barb Merino, DO [employer]

## 2020-02-25 ENCOUNTER — Emergency Department: Payer: Medicare Other

## 2020-02-25 ENCOUNTER — Other Ambulatory Visit: Payer: Self-pay

## 2020-02-25 ENCOUNTER — Emergency Department
Admission: EM | Admit: 2020-02-25 | Discharge: 2020-02-25 | Disposition: A | Discharge status: Home / Self Care | Payer: Medicare Other | Attending: Emergency Medicine | Admitting: Emergency Medicine

## 2020-02-25 DIAGNOSIS — R404 Transient alteration of awareness: Secondary | ICD-10-CM | POA: Diagnosis not present

## 2020-02-25 DIAGNOSIS — Z87891 Personal history of nicotine dependence: Secondary | ICD-10-CM | POA: Insufficient documentation

## 2020-02-25 DIAGNOSIS — I10 Essential (primary) hypertension: Secondary | ICD-10-CM | POA: Insufficient documentation

## 2020-02-25 DIAGNOSIS — R531 Weakness: Secondary | ICD-10-CM | POA: Diagnosis not present

## 2020-02-25 DIAGNOSIS — R400 Somnolence: Secondary | ICD-10-CM | POA: Diagnosis not present

## 2020-02-25 DIAGNOSIS — Z85828 Personal history of other malignant neoplasm of skin: Secondary | ICD-10-CM | POA: Diagnosis not present

## 2020-02-25 DIAGNOSIS — Z743 Need for continuous supervision: Secondary | ICD-10-CM | POA: Diagnosis not present

## 2020-02-25 DIAGNOSIS — H6091 Unspecified otitis externa, right ear: Secondary | ICD-10-CM

## 2020-02-25 DIAGNOSIS — Z96651 Presence of right artificial knee joint: Secondary | ICD-10-CM | POA: Insufficient documentation

## 2020-02-25 DIAGNOSIS — Z79899 Other long term (current) drug therapy: Secondary | ICD-10-CM | POA: Diagnosis not present

## 2020-02-25 DIAGNOSIS — R112 Nausea with vomiting, unspecified: Secondary | ICD-10-CM | POA: Diagnosis not present

## 2020-02-25 DIAGNOSIS — Z20822 Contact with and (suspected) exposure to covid-19: Secondary | ICD-10-CM | POA: Insufficient documentation

## 2020-02-25 DIAGNOSIS — R509 Fever, unspecified: Secondary | ICD-10-CM | POA: Diagnosis not present

## 2020-02-25 DIAGNOSIS — R4781 Slurred speech: Secondary | ICD-10-CM | POA: Diagnosis not present

## 2020-02-25 DIAGNOSIS — R069 Unspecified abnormalities of breathing: Secondary | ICD-10-CM | POA: Diagnosis not present

## 2020-02-25 LAB — CBC WITH DIFFERENTIAL/PLATELET
Abs Immature Granulocytes: 0.02 10*3/uL (ref 0.00–0.07)
Basophils Absolute: 0 10*3/uL (ref 0.0–0.1)
Basophils Relative: 1 %
Eosinophils Absolute: 0.1 10*3/uL (ref 0.0–0.5)
Eosinophils Relative: 2 %
HCT: 37.2 % — ABNORMAL LOW (ref 39.0–52.0)
Hemoglobin: 13.2 g/dL (ref 13.0–17.0)
Immature Granulocytes: 0 %
Lymphocytes Relative: 6 %
Lymphs Abs: 0.5 10*3/uL — ABNORMAL LOW (ref 0.7–4.0)
MCH: 28.9 pg (ref 26.0–34.0)
MCHC: 35.5 g/dL (ref 30.0–36.0)
MCV: 81.6 fL (ref 80.0–100.0)
Monocytes Absolute: 0.4 10*3/uL (ref 0.1–1.0)
Monocytes Relative: 5 %
Neutro Abs: 7.5 10*3/uL (ref 1.7–7.7)
Neutrophils Relative %: 86 %
Platelets: 257 10*3/uL (ref 150–400)
RBC: 4.56 MIL/uL (ref 4.22–5.81)
RDW: 12.8 % (ref 11.5–15.5)
WBC: 8.7 10*3/uL (ref 4.0–10.5)
nRBC: 0 % (ref 0.0–0.2)

## 2020-02-25 LAB — COMPREHENSIVE METABOLIC PANEL
ALT: 16 U/L (ref 0–44)
AST: 22 U/L (ref 15–41)
Albumin: 4.7 g/dL (ref 3.5–5.0)
Alkaline Phosphatase: 65 U/L (ref 38–126)
Anion gap: 10 (ref 5–15)
BUN: 11 mg/dL (ref 8–23)
CO2: 26 mmol/L (ref 22–32)
Calcium: 9.3 mg/dL (ref 8.9–10.3)
Chloride: 101 mmol/L (ref 98–111)
Creatinine, Ser: 1.04 mg/dL (ref 0.61–1.24)
GFR calc Af Amer: >60 mL/min (ref >60)
GFR calc non Af Amer: >60 mL/min (ref >60)
Glucose, Bld: 140 mg/dL — ABNORMAL HIGH (ref 70–99)
Potassium: 3.6 mmol/L (ref 3.5–5.1)
Sodium: 137 mmol/L (ref 135–145)
Total Bilirubin: 1 mg/dL (ref 0.3–1.2)
Total Protein: 7.8 g/dL (ref 6.5–8.1)

## 2020-02-25 LAB — URINALYSIS, ROUTINE W REFLEX MICROSCOPIC
Bilirubin Urine: NEGATIVE
Glucose, UA: NEGATIVE mg/dL
Hgb urine dipstick: NEGATIVE
Ketones, ur: NEGATIVE mg/dL
Leukocytes,Ua: NEGATIVE
Nitrite: NEGATIVE
Protein, ur: NEGATIVE mg/dL
Specific Gravity, Urine: 1.011 (ref 1.005–1.030)
pH: 9 — ABNORMAL HIGH (ref 5.0–8.0)

## 2020-02-25 LAB — LACTIC ACID, PLASMA: Lactic Acid, Venous: 1.5 mmol/L (ref 0.5–1.9)

## 2020-02-25 LAB — SARS CORONAVIRUS 2 BY RT PCR (HOSPITAL ORDER, PERFORMED IN ~~LOC~~ HOSPITAL LAB): SARS Coronavirus 2: NEGATIVE

## 2020-02-25 MED ORDER — CIPROFLOXACIN-DEXAMETHASONE 0.3-0.1 % OT SUSP
4.0000 [drp] | Freq: Two times a day (BID) | OTIC | 0 refills | Status: DC
Start: 2020-02-25 — End: 2020-03-11

## 2020-02-25 NOTE — ED Notes (Signed)
Pt given water to drink at request. Pt tolerated PO challenge.

## 2020-02-25 NOTE — ED Notes (Signed)
Pt transported to CT ?

## 2020-02-25 NOTE — ED Notes (Signed)
Patient given a warm blanket. 

## 2020-02-25 NOTE — ED Notes (Signed)
Pt able to use urinal by self. 250 yellow clear urine.

## 2020-02-25 NOTE — ED Notes (Signed)
Per MD Archie Balboa this RN asked son if pt slurring words was normal. He states it is as the pt had throat radiation 15 years ago and is "all burned up" so it'd a "combination of not being able to hear and the radiation". Son states pt appears baseline and pt was able to get into wheelchair for this RNw/o need for assistance and into lifted truck w minimal assistance.

## 2020-02-25 NOTE — Discharge Instructions (Addendum)
Please seek medical attention for any high fevers, chest pain, shortness of breath, change in behavior, persistent vomiting, bloody stool or any other new or concerning symptoms.  

## 2020-02-25 NOTE — ED Provider Notes (Signed)
[facility] Emergency Department Provider Note   ____________________________________________   I have reviewed the triage vital signs and the nursing notes.   HISTORY  Chief Complaint Emesis   History limited by: Hearing and speech difficulty, AMS   HPI Marcus Wright is a 64 y.o. male who presents to the emergency department today via EMS because of concern for nausea and emesis. The patient is somewhat somnolent so cannot give a great history. Some history obtained by son. Apparently the patient had an episode of vomiting earlier in the day. When family went to check on him it no longer was present, however after family left it happened again so EMS was called. Son says that the patient is not quite as alert as he normally is. Unsure if he took his medication this morning.    Records reviewed. Per medical record review patient has a history of HTN, chronic mucoid otitis media right ear.   Past Medical History:  Diagnosis Date  . Cancer [facility]) 2003   skin'  . Hypertension   . Thyroid disorder   . Wears dentures    full upper  . Wears hearing aid     Patient Active Problem List   Diagnosis Date Noted  . DJD (degenerative joint disease), thoracic 11/22/2018  . Mastoiditis of right side 06/22/2017  . Hearing loss 05/10/2017  . Colonic constipation   . Hyperlipidemia 05/21/2016  . History of sinus cancer   . Hypertension   . Thyroid disorder   . Cervical radicular pain 02/07/2014  . History of neck surgery 02/07/2014  . Gynecomastia, male 06/20/2013    Past Surgical History:  Procedure Laterality Date  . BACK SURGERY  2011  . BREAST SURGERY Right 11-22-12  . CHOLECYSTECTOMY    . COLONOSCOPY    . COLONOSCOPY WITH PROPOFOL N/A 07/19/2016   Procedure: COLONOSCOPY WITH PROPOFOL;  Surgeon: Lucilla Lame, MD;  Location: [facility];  Service: Endoscopy;  Laterality: N/A;  . REPLACEMENT TOTAL KNEE Right 1993  . SINUS SURGERY WITH  INSTATRAK  2003   cancer    Prior to Admission medications   Medication Sig Start Date End Date Taking? Authorizing Provider  amLODipine (NORVASC) 2.5 MG tablet Take 1 tablet (2.5 mg total) by mouth daily. 11/29/19   Johnson, Megan P, DO  ciprofloxacin-dexamethasone (CIPRODEX) OTIC suspension Place 4 drops into both ears 2 (two) times daily. 05/28/19   Johnson, Megan P, DO  levothyroxine (SYNTHROID) 50 MCG tablet TAKE 1 TABLET BY MOUTH EVERY DAY 02/20/20   Johnson, Megan P, DO  meloxicam (MOBIC) 15 MG tablet Take 1 tablet (15 mg total) by mouth daily. 11/29/19   Johnson, Megan P, DO  rosuvastatin (CRESTOR) 20 MG tablet Take 1 tablet (20 mg total) by mouth daily. 11/29/19   [place] P, DO    Allergies Doxazosin  No family history on file.  Social History Social History   Tobacco Use  . Smoking status: Former Research scientist (life sciences)  . Smokeless tobacco: Never Used  . Tobacco comment: quit 20+ yrs ago  Substance Use Topics  . Alcohol use: Not Currently    Comment: on occasion, 1-2 beers or wine occ.   . Drug use: No    Review of Systems Unable to obtain secondary to AMS ____________________________________________   PHYSICAL EXAM:  VITAL SIGNS: ED Triage Vitals  Enc Vitals Group     BP 02/25/20 1726 119/75     Pulse Rate 02/25/20 1726 86     Resp 02/25/20  1726 18     Temp 02/25/20 1726 (!) 100.9 F (38.3 C)     Temp Source 02/25/20 1726 Oral     SpO2 02/25/20 1726 98 %     Weight 02/25/20 1719 165 lb (74.8 kg)     Height 02/25/20 1719 5\' 10"  (1.778 m)     Head Circumference --      Peak Flow --      Pain Score 02/25/20 1719 0   Constitutional: Somnolent, will wake up to verbal stimuli.  Eyes: Conjunctivae are normal.  ENT      Head: Normocephalic and atraumatic.      Ears: Discharge noted in right ear canal.       Nose: No congestion/rhinnorhea.      Mouth/Throat: Mucous membranes are moist.      Neck: No stridor. Hematological/Lymphatic/Immunilogical: No cervical  lymphadenopathy. Cardiovascular: Normal rate, regular rhythm.  No murmurs, rubs, or gallops.  Respiratory: Normal respiratory effort without tachypnea nor retractions. Breath sounds are clear and equal bilaterally. No wheezes/rales/rhonchi. Gastrointestinal: Soft and non tender. No rebound. No guarding.  Genitourinary: Deferred Musculoskeletal: Normal range of motion in all extremities. No lower extremity edema. Neurologic:  Somnolent, awakens to verbal stimuli.  Skin:  Skin is warm, dry and intact. No rash noted. ____________________________________________    LABS (pertinent positives/negatives)  CMP wnl except glu 140 Covid neg UA clear, negative nitrite, leukocytes Lactic acid 1.5 CBC wbc 8.7, hgb 13.2, plt 257  ____________________________________________   EKG  I, Nance Pear, attending physician, personally viewed and interpreted this EKG  EKG Time: 1717 Rate: 89 Rhythm: sinus rhythm Axis: normal Intervals: qtc 466 QRS: narrow ST changes: no st elevation Impression: artifact present, abnormal ekg   ____________________________________________    RADIOLOGY  CXR No acute abnormality  CT head No acute abnormality  ____________________________________________   PROCEDURES  Procedures  ____________________________________________   INITIAL IMPRESSION / ASSESSMENT AND PLAN / ED COURSE  Pertinent labs & imaging results that were available during my care of the patient were reviewed by me and considered in my medical decision making (see chart for details).   Presented to the emergency department today because of concerns for some vomiting.  Patient was found to have slight fever.  Broad work-up was initiated to find etiology of the patient's symptoms.  Physical exam was notable for some debris and pus to the right ear canal.  Patient does have history of ear infections both otitis media and externa per chart review.  Chest x-ray head CT blood work  without concerning findings.  Lactic acid and white blood cell count were normal.  At this point I do wonder if the slight fever was due to the ear infection.  Will plan on starting eardrops for the patient.  Given lack of other findings concerning for sepsis do think would be reasonable for patient to be discharged home.  ___________________________________________   FINAL CLINICAL IMPRESSION(S) / ED DIAGNOSES  Final diagnoses:  Otitis externa of right ear, unspecified chronicity, unspecified type  Fever, unspecified fever cause     Note: This dictation was prepared with Dragon dictation. Any transcriptional errors that result from this process are unintentional     Nance Pear, MD 02/25/20 2251

## 2020-02-25 NOTE — ED Notes (Signed)
Pt's left ear cleaned after removing hearing aid. Pt had green thick discharge located inside ear lobe.  Hearing aids placed in urine specimen cup on bedside table.  Pt resting comfortably at this time.

## 2020-02-25 NOTE — ED Triage Notes (Signed)
Pt comes via [facility] with c/o vomiting. EMS reports pt texted niece stating he needed help. Fire arrived on scene found pt on toilet. Pt denies any other complaints or pain.  Pt is HOH. Pt states he is cold .EMS reports 101.5 axillary temp.  Pt is alert. Pt has slurred speech whic his baseline per EMS and family.

## 2020-02-27 LAB — URINE CULTURE: Culture: <10000 — AB

## 2020-03-01 LAB — CULTURE, BLOOD (ROUTINE X 2)
Culture: NO GROWTH
Culture: NO GROWTH
Special Requests: ADEQUATE

## 2020-03-04 ENCOUNTER — Ambulatory Visit (INDEPENDENT_AMBULATORY_CARE_PROVIDER_SITE_OTHER): Payer: Medicare Other | Admitting: Family Medicine

## 2020-03-04 ENCOUNTER — Encounter: Payer: Self-pay | Admitting: Family Medicine

## 2020-03-04 ENCOUNTER — Other Ambulatory Visit: Payer: Self-pay

## 2020-03-04 VITALS — BP 131/74 | HR 66 | Temp 98.5°F | Ht 72.24 in | Wt 170.0 lb

## 2020-03-04 DIAGNOSIS — H60391 Other infective otitis externa, right ear: Secondary | ICD-10-CM | POA: Diagnosis not present

## 2020-03-04 NOTE — Progress Notes (Signed)
BP 131/74 (BP Location: Left Arm, Patient Position: Sitting, Cuff Size: Normal)   Pulse 66   Temp 98.5 F (36.9 C) (Oral)   Ht 6' 0.24" (1.835 m)   Wt 170 lb (77.1 kg)   SpO2 100%   BMI 22.90 kg/m    Subjective:    Patient ID: Marcus Wright, male    DOB: [birth year], 64 y.o.   MRN: [id number]  HPI: Marcus Wright is a 64 y.o. male  Chief Complaint  Patient presents with  . Hospitalization Follow-up   ER FOLLOW UP Time since discharge: 9 days Hospital/facility: [facility] Diagnosis: Otitis externa Procedures/tests:  CLINICAL DATA:  Altered mental status today.  Vomiting.  EXAM: CT HEAD WITHOUT CONTRAST  TECHNIQUE: Contiguous axial images were obtained from the base of the skull through the vertex without intravenous contrast.  COMPARISON:  Head CT 04/26/2019  FINDINGS: Brain: Generalized cerebral and cerebellar atrophy. No intracranial hemorrhage, mass effect, or midline shift. No hydrocephalus. The basilar cisterns are patent. No evidence of territorial infarct or acute ischemia. No extra-axial or intracranial fluid collection.  Vascular: Atherosclerosis of skullbase vasculature without hyperdense vessel or abnormal calcification.  Skull: No fracture or focal lesion.  Sinuses/Orbits: Mild mucosal thickening of right side of sphenoid sinus and right maxillary sinus. Orbits are unremarkable. Chronic opacification of right mastoid air cells.  Other: None.  IMPRESSION: 1. No acute intracranial abnormality. 2. Generalized cerebral and cerebellar atrophy. 3. Chronic opacification of right mastoid air cells.  CLINICAL DATA:  64 year old male with fever and altered mental status.  EXAM: PORTABLE CHEST 1 VIEW  COMPARISON:  Chest radiograph dated 07/19/2018.  FINDINGS: No focal consolidation, pleural effusion, pneumothorax. The cardiac silhouette is within limits. Atherosclerotic calcification of the aorta. No acute osseous  pathology.  IMPRESSION: No active disease.  Consultants: None New medications: Ciprodex Discharge instructions:  Follow up here Status: better  Relevant past medical, surgical, family and social history reviewed and updated as indicated. Interim medical history since our last visit reviewed. Allergies and medications reviewed and updated.  Review of Systems  Constitutional: Negative.   HENT: Positive for ear discharge, ear pain and sinus pain. Negative for congestion, dental problem, drooling, facial swelling, hearing loss, mouth sores, nosebleeds, postnasal drip, rhinorrhea, sinus pressure, sneezing, sore throat, tinnitus, trouble swallowing and voice change.   Respiratory: Negative.   Cardiovascular: Negative.   Gastrointestinal: Positive for vomiting. Negative for abdominal distention, abdominal pain, anal bleeding, blood in stool, constipation, diarrhea, nausea and rectal pain.  Neurological: Negative.   Psychiatric/Behavioral: Negative.     Per HPI unless specifically indicated above     Objective:    BP 131/74 (BP Location: Left Arm, Patient Position: Sitting, Cuff Size: Normal)   Pulse 66   Temp 98.5 F (36.9 C) (Oral)   Ht 6' 0.24" (1.835 m)   Wt 170 lb (77.1 kg)   SpO2 100%   BMI 22.90 kg/m   Wt Readings from Last 3 Encounters:  03/04/20 170 lb (77.1 kg)  02/25/20 165 lb (74.8 kg)  05/28/19 171 lb 5 oz (77.7 kg)    Physical Exam Vitals and nursing note reviewed.  Constitutional:      General: He is not in acute distress.    Appearance: Normal appearance. He is normal weight. He is not ill-appearing, toxic-appearing or diaphoretic.  HENT:     Head: Normocephalic and atraumatic.     Right Ear: External ear normal.     Left Ear: External ear normal.  Nose: Nose normal.     Mouth/Throat:     Mouth: Mucous membranes are moist.     Pharynx: Oropharynx is clear.  Eyes:     General: No scleral icterus.       Right eye: No discharge.        Left eye: No  discharge.     Extraocular Movements: Extraocular movements intact.     Conjunctiva/sclera: Conjunctivae normal.     Pupils: Pupils are equal, round, and reactive to light.  Cardiovascular:     Rate and Rhythm: Normal rate and regular rhythm.     Pulses: Normal pulses.     Heart sounds: Normal heart sounds. No murmur. No friction rub. No gallop.   Pulmonary:     Effort: Pulmonary effort is normal. No respiratory distress.     Breath sounds: Normal breath sounds. No stridor. No wheezing, rhonchi or rales.  Chest:     Chest wall: No tenderness.  Musculoskeletal:        General: Normal range of motion.     Cervical back: Normal range of motion and neck supple.  Skin:    General: Skin is warm and dry.     Capillary Refill: Capillary refill takes less than 2 seconds.     Coloration: Skin is not jaundiced or pale.     Findings: No bruising, erythema, lesion or rash.  Neurological:     General: No focal deficit present.     Mental Status: He is alert and oriented to person, place, and time. Mental status is at baseline.  Psychiatric:        Mood and Affect: Mood normal.        Behavior: Behavior normal.        Thought Content: Thought content normal.        Judgment: Judgment normal.     Results for orders placed or performed during the hospital encounter of 02/25/20  Blood Culture (routine x 2)   Specimen: BLOOD  Result Value Ref Range   Specimen Description BLOOD LEFT ANTECUBITAL    Special Requests      BOTTLES DRAWN AEROBIC AND ANAEROBIC Blood Culture adequate volume   Culture      NO GROWTH 5 DAYS Performed at [facility], [address] [place], [place] [address]    Report Status 03/01/2020 FINAL   Blood Culture (routine x 2)   Specimen: BLOOD  Result Value Ref Range   Specimen Description BLOOD RIGHT ANTECUBITAL    Special Requests      BOTTLES DRAWN AEROBIC AND ANAEROBIC Blood Culture results may not be optimal due to an excessive volume of blood received  in culture bottles   Culture      NO GROWTH 5 DAYS Performed at [facility], [address], [place], [place] [address]    Report Status 03/01/2020 FINAL   Urine culture   Specimen: Urine, Random  Result Value Ref Range   Specimen Description      URINE, RANDOM Performed at [facility], [address] [place], [place] [address]    Special Requests      NONE Performed at [facility], [address] [place], [place] [address]    Culture (A)     <10,000 COLONIES/mL INSIGNIFICANT GROWTH Performed at [facility], [place] [address] [place], [place] [address]    Report Status 02/27/2020 FINAL   SARS Coronavirus 2 by RT PCR (hospital order, performed in [facility] hospital lab)  Nasopharyngeal Nasopharyngeal Swab   Specimen: Nasopharyngeal Swab  Result Value Ref Range   SARS Coronavirus 2 NEGATIVE NEGATIVE  Lactic acid, plasma  Result Value Ref Range   Lactic Acid, Venous 1.5 0.5 - 1.9 mmol/L  Comprehensive metabolic panel  Result Value Ref Range   Sodium 137 135 - 145 mmol/L   Potassium 3.6 3.5 - 5.1 mmol/L   Chloride 101 98 - 111 mmol/L   CO2 26 22 - 32 mmol/L   Glucose, Bld 140 (H) 70 - 99 mg/dL   BUN 11 8 - 23 mg/dL   Creatinine, Ser 1.04 0.61 - 1.24 mg/dL   Calcium 9.3 8.9 - 10.3 mg/dL   Total Protein 7.8 6.5 - 8.1 g/dL   Albumin 4.7 3.5 - 5.0 g/dL   AST 22 15 - 41 U/L   ALT 16 0 - 44 U/L   Alkaline Phosphatase 65 38 - 126 U/L   Total Bilirubin 1.0 0.3 - 1.2 mg/dL   GFR calc non Af Amer >60 >60 mL/min   GFR calc Af Amer >60 >60 mL/min   Anion gap 10 5 - 15  CBC WITH DIFFERENTIAL  Result Value Ref Range   WBC 8.7 4.0 - 10.5 K/uL   RBC 4.56 4.22 - 5.81 MIL/uL   Hemoglobin 13.2 13.0 - 17.0 g/dL   HCT 37.2 (L) 39.0 - 52.0 %   MCV 81.6 80.0 - 100.0 fL   MCH 28.9 26.0 - 34.0 pg   MCHC 35.5 30.0 - 36.0 g/dL   RDW 12.8 11.5 - 15.5 %   Platelets 257 150 - 400 K/uL   nRBC 0.0 0.0 - 0.2 %   Neutrophils Relative % 86 %   Neutro Abs  7.5 1.7 - 7.7 K/uL   Lymphocytes Relative 6 %   Lymphs Abs 0.5 (L) 0.7 - 4.0 K/uL   Monocytes Relative 5 %   Monocytes Absolute 0.4 0.1 - 1.0 K/uL   Eosinophils Relative 2 %   Eosinophils Absolute 0.1 0.0 - 0.5 K/uL   Basophils Relative 1 %   Basophils Absolute 0.0 0.0 - 0.1 K/uL   Immature Granulocytes 0 %   Abs Immature Granulocytes 0.02 0.00 - 0.07 K/uL  Urinalysis, Routine w reflex microscopic  Result Value Ref Range   Color, Urine YELLOW (A) YELLOW   APPearance CLEAR (A) CLEAR   Specific Gravity, Urine 1.011 1.005 - 1.030   pH 9.0 (H) 5.0 - 8.0   Glucose, UA NEGATIVE NEGATIVE mg/dL   Hgb urine dipstick NEGATIVE NEGATIVE   Bilirubin Urine NEGATIVE NEGATIVE   Ketones, ur NEGATIVE NEGATIVE mg/dL   Protein, ur NEGATIVE NEGATIVE mg/dL   Nitrite NEGATIVE NEGATIVE   Leukocytes,Ua NEGATIVE NEGATIVE      Assessment & Plan:   Problem List Items Addressed This Visit    None    Visit Diagnoses    Acute infective otitis externa, right    -  Primary   Healing well. Continues with some bleeding. Will finish his ciprodex and recheck next week. If still not better or worse, will treat. Call with any concerns.    Relevant Medications   cefdinir (OMNICEF) 300 MG capsule       Follow up plan: Return next week, ear recheck.

## 2020-03-05 ENCOUNTER — Other Ambulatory Visit: Payer: Self-pay | Admitting: Family Medicine

## 2020-03-05 NOTE — Telephone Encounter (Signed)
Requested medication (s) are due for refill today -unknown  Requested medication (s) are on the active medication list -yes  Future visit scheduled -yes  Last refill: Omnicef- 01/17/20, Ocuflox-5/19  Notes to clinic: request for RF of medication not assigned to protocol and prescribed by outside provider  Requested Prescriptions  Pending Prescriptions Disp Refills   cefdinir (OMNICEF) 300 MG capsule      Sig: Take 1 capsule (300 mg total) by mouth 2 (two) times daily.      Off-Protocol Failed - 03/05/2020 11:59 AM      Failed - Medication not assigned to a protocol, review manually.      Passed - Valid encounter within last 12 months    Recent Outpatient Visits           Yesterday Acute infective otitis externa, right   [facility] [place], Megan P, DO   3 months ago Essential hypertension   [place], Megan P, DO   9 months ago Routine general medical examination at a health care facility   [place], [place], DO   1 year ago Essential hypertension   [place], [place], DO   1 year ago Medicare annual wellness visit, subsequent   [place], [place], DO       Future Appointments             In 6 days Venita Lick, NP [employer], [facility]   In 3 months [place], Megan P, DO [place], [facility]              ofloxacin (OCUFLOX) 0.3 % ophthalmic solution 5 mL       Off-Protocol Failed - 03/05/2020 11:59 AM      Failed - Medication not assigned to a protocol, review manually.      Passed - Valid encounter within last 12 months    Recent Outpatient Visits           Yesterday Acute infective otitis externa, right   [facility] [place], Megan P, DO   3 months ago Essential hypertension   [place], Megan P, DO   9 months ago Routine general medical examination at a health care facility   [place], [place], DO   1 year ago Essential hypertension   [place], [place], DO   1 year ago Medicare annual wellness visit, subsequent   [place], [place], DO       Future Appointments             In 6 days Venita Lick, NP [employer], [facility]   In 3 months Johnson, Megan P, DO [employer], [facility]                Requested Prescriptions  Pending Prescriptions Disp Refills   cefdinir (OMNICEF) 300 MG capsule      Sig: Take 1 capsule (300 mg total) by mouth 2 (two) times daily.      Off-Protocol Failed - 03/05/2020 11:59 AM      Failed - Medication not assigned to a protocol, review manually.      Passed - Valid encounter within last 12 months    Recent Outpatient Visits           Yesterday Acute infective otitis externa, right   [facility] [place], [place], DO  3 months ago Essential hypertension   [place], Megan P, DO   9 months ago Routine general medical examination at a health care facility   [place], [place], DO   1 year ago Essential hypertension   [place], [place], DO   1 year ago Medicare annual wellness visit, subsequent   [place], [place], DO       Future Appointments             In 6 days Venita Lick, NP [employer], [facility]   In 3 months Johnson, Megan P, DO [place], [facility]              ofloxacin (OCUFLOX) 0.3 % ophthalmic solution 5 mL       Off-Protocol Failed - 03/05/2020 11:59 AM      Failed - Medication not assigned to a protocol, review manually.      Passed - Valid encounter within last 12 months    Recent Outpatient Visits           Yesterday Acute infective otitis externa, right   [facility] [place], Megan P, DO   3 months ago Essential hypertension   [place], Megan P,  DO   9 months ago Routine general medical examination at a health care facility   [place], [place], DO   1 year ago Essential hypertension   [place], [place], DO   1 year ago Medicare annual wellness visit, subsequent   [place], [place], DO       Future Appointments             In 6 days Venita Lick, NP [employer], [place]   In 3 months Wynetta Emery, Barb Merino, DO [employer], [facility]

## 2020-03-05 NOTE — Telephone Encounter (Signed)
Copied from [place] [id number]. Topic: Quick Communication - Rx Refill/Question >> Mar 05, 2020 11:48 AM Rainey Pines A wrote: Medication:cefdinir (OMNICEF) 300 MG capsule ,ofloxacin (OCUFLOX) 0.3 % ophthalmic solution (Patient is completely out of medication and would like request expedited today.)  Has the patient contacted their pharmacy?yes (Agent: If no, request that the patient contact the pharmacy for the refill.) (Agent: If yes, when and what did the pharmacy advise?)contact pcp  Preferred Pharmacy (with phone number or street name): [id number] - [place], [place]. [address]  Phone:  [phone] Fax:  [phone]     Agent: Please be advised that RX refills may take up to 3 business days. We ask that you follow-up with your pharmacy.

## 2020-03-11 ENCOUNTER — Encounter: Payer: Self-pay | Admitting: Nurse Practitioner

## 2020-03-11 ENCOUNTER — Ambulatory Visit (INDEPENDENT_AMBULATORY_CARE_PROVIDER_SITE_OTHER): Payer: Medicare Other | Admitting: Nurse Practitioner

## 2020-03-11 ENCOUNTER — Other Ambulatory Visit: Payer: Self-pay

## 2020-03-11 DIAGNOSIS — H6011 Cellulitis of right external ear: Secondary | ICD-10-CM | POA: Insufficient documentation

## 2020-03-11 DIAGNOSIS — H6091 Unspecified otitis externa, right ear: Secondary | ICD-10-CM | POA: Insufficient documentation

## 2020-03-11 DIAGNOSIS — H6690 Otitis media, unspecified, unspecified ear: Secondary | ICD-10-CM | POA: Insufficient documentation

## 2020-03-11 DIAGNOSIS — H60391 Other infective otitis externa, right ear: Secondary | ICD-10-CM

## 2020-03-11 MED ORDER — CIPROFLOXACIN-DEXAMETHASONE 0.3-0.1 % OT SUSP: 4.0000 [drp] | Freq: Two times a day (BID) | OTIC | 0 refills | Status: DC

## 2020-03-11 NOTE — Patient Instructions (Signed)

## 2020-03-11 NOTE — Assessment & Plan Note (Signed)
Acute and improving in symptoms, however moderate drainage remains, which patient reports is more than his baseline.  Will extend Ciprodex x 7 days, new script sent, and to complete Cefdinir.  Keep fingers out of ear to avoid ongoing infection.  If worsening symptoms immediately return to office.  Plan for follow-up in 7 days.

## 2020-03-11 NOTE — Progress Notes (Signed)
BP 113/70   Pulse 62   Temp 97.7 F (36.5 C) (Oral)   Wt 169 lb 12.8 oz (77 kg)   SpO2 96%   BMI 22.87 kg/m    Subjective:    Patient ID: Marcus Wright, male    DOB: [birth year], 64 y.o.   MRN: [id number]  HPI: Marcus Wright is a 64 y.o. male  Chief Complaint  Patient presents with  . Ear Drainage    pt states he is still having a little drainage form his R ear    ACUTE OTITIS EXTERNA Here for follow-up for acute infective otitis externa, initially treated with Ciprodex on 02/25/20 in ER and then presented for follow-up 03/04/20 with PCP, was to complete abx regimen + Cefdinir added.  He reports it is still draining some, has put finger in ear and noticed it + it has been draining out. He reports remainder of symptoms have improved.  Denies any pain.   Duration: weeks Involved ear(s): right Fever: no Otorrhea: yes Upper respiratory infection symptoms: no Pruritus: no Hearing loss: baseline Water immersion no Using Q-tips: no Recurrent otitis media: yes Status: fluctuating Treatments attempted: Ciprodex   Relevant past medical, surgical, family and social history reviewed and updated as indicated. Interim medical history since our last visit reviewed. Allergies and medications reviewed and updated.  Review of Systems  Constitutional: Negative for activity change, diaphoresis, fatigue and fever.  HENT: Positive for ear discharge. Negative for ear pain.   Respiratory: Negative for cough, chest tightness, shortness of breath and wheezing.   Cardiovascular: Negative for chest pain, palpitations and leg swelling.  Gastrointestinal: Negative.   Endocrine: Negative for cold intolerance, heat intolerance, polydipsia, polyphagia and polyuria.  Skin: Negative.   Neurological: Negative.   Psychiatric/Behavioral: Negative.     Per HPI unless specifically indicated above     Objective:    BP 113/70   Pulse 62   Temp 97.7 F (36.5 C) (Oral)   Wt 169 lb 12.8 oz (77  kg)   SpO2 96%   BMI 22.87 kg/m   Wt Readings from Last 3 Encounters:  03/11/20 169 lb 12.8 oz (77 kg)  03/04/20 170 lb (77.1 kg)  02/25/20 165 lb (74.8 kg)    Physical Exam Vitals and nursing note reviewed.  Constitutional:      Appearance: He is well-developed.  HENT:     Head: Normocephalic and atraumatic.     Right Ear: Tympanic membrane and external ear normal. Decreased hearing noted. Drainage (thick yellowish, moderate amount in canal) present. No swelling or tenderness.     Left Ear: Tympanic membrane and external ear normal. Decreased hearing noted. No drainage, swelling or tenderness.     Nose: Nose normal.     Right Sinus: No maxillary sinus tenderness or frontal sinus tenderness.     Left Sinus: No maxillary sinus tenderness or frontal sinus tenderness.     Mouth/Throat:     Mouth: Mucous membranes are moist.     Pharynx: Oropharynx is clear. Uvula midline.  Eyes:     General: Lids are normal.        Right eye: No discharge.        Left eye: No discharge.     Conjunctiva/sclera: Conjunctivae normal.     Pupils: Pupils are equal, round, and reactive to light.  Neck:     Trachea: Trachea normal.  Cardiovascular:     Rate and Rhythm: Normal rate and regular rhythm.  Heart sounds: Normal heart sounds, S1 normal and S2 normal. No murmur heard.  No gallop.   Pulmonary:     Effort: Pulmonary effort is normal. No accessory muscle usage or respiratory distress.     Breath sounds: Normal breath sounds.  Abdominal:     General: Bowel sounds are normal.     Palpations: Abdomen is soft.  Musculoskeletal:        General: Normal range of motion.     Cervical back: Normal range of motion and neck supple.     Right lower leg: No edema.     Left lower leg: No edema.  Skin:    General: Skin is warm and dry.  Neurological:     Mental Status: He is alert and oriented to person, place, and time.  Psychiatric:        Attention and Perception: Attention normal.        Mood  and Affect: Mood normal.        Behavior: Behavior normal.        Thought Content: Thought content normal.     Results for orders placed or performed during the hospital encounter of 02/25/20  Blood Culture (routine x 2)   Specimen: BLOOD  Result Value Ref Range   Specimen Description BLOOD LEFT ANTECUBITAL    Special Requests      BOTTLES DRAWN AEROBIC AND ANAEROBIC Blood Culture adequate volume   Culture      NO GROWTH 5 DAYS Performed at [facility], [address] [place], [place] [address]    Report Status 03/01/2020 FINAL   Blood Culture (routine x 2)   Specimen: BLOOD  Result Value Ref Range   Specimen Description BLOOD RIGHT ANTECUBITAL    Special Requests      BOTTLES DRAWN AEROBIC AND ANAEROBIC Blood Culture results may not be optimal due to an excessive volume of blood received in culture bottles   Culture      NO GROWTH 5 DAYS Performed at [facility], [address], [place], [place] [address]    Report Status 03/01/2020 FINAL   Urine culture   Specimen: Urine, Random  Result Value Ref Range   Specimen Description      URINE, RANDOM Performed at [facility], [address] [place], [place] [address]    Special Requests      NONE Performed at [facility], [address], [place], [place] [address]    Culture (A)     <10,000 COLONIES/mL INSIGNIFICANT GROWTH Performed at [facility], [place] [address], [place], [place] [address]    Report Status 02/27/2020 FINAL   SARS Coronavirus 2 by RT PCR (hospital order, performed in [facility] hospital lab) Nasopharyngeal Nasopharyngeal Swab   Specimen: Nasopharyngeal Swab  Result Value Ref Range   SARS Coronavirus 2 NEGATIVE NEGATIVE  Lactic acid, plasma  Result Value Ref Range   Lactic Acid, Venous 1.5 0.5 - 1.9 mmol/L  Comprehensive metabolic panel  Result Value Ref Range   Sodium 137 135 - 145 mmol/L   Potassium 3.6 3.5 - 5.1 mmol/L   Chloride 101 98 - 111  mmol/L   CO2 26 22 - 32 mmol/L   Glucose, Bld 140 (H) 70 - 99 mg/dL   BUN 11 8 - 23 mg/dL   Creatinine, Ser 1.04 0.61 - 1.24 mg/dL   Calcium 9.3 8.9 - 10.3 mg/dL   Total Protein 7.8 6.5 - 8.1 g/dL   Albumin 4.7 3.5 -  5.0 g/dL   AST 22 15 - 41 U/L   ALT 16 0 - 44 U/L   Alkaline Phosphatase 65 38 - 126 U/L   Total Bilirubin 1.0 0.3 - 1.2 mg/dL   GFR calc non Af Amer >60 >60 mL/min   GFR calc Af Amer >60 >60 mL/min   Anion gap 10 5 - 15  CBC WITH DIFFERENTIAL  Result Value Ref Range   WBC 8.7 4.0 - 10.5 K/uL   RBC 4.56 4.22 - 5.81 MIL/uL   Hemoglobin 13.2 13.0 - 17.0 g/dL   HCT 37.2 (L) 39 - 52 %   MCV 81.6 80.0 - 100.0 fL   MCH 28.9 26.0 - 34.0 pg   MCHC 35.5 30.0 - 36.0 g/dL   RDW 12.8 11.5 - 15.5 %   Platelets 257 150 - 400 K/uL   nRBC 0.0 0.0 - 0.2 %   Neutrophils Relative % 86 %   Neutro Abs 7.5 1.7 - 7.7 K/uL   Lymphocytes Relative 6 %   Lymphs Abs 0.5 (L) 0.7 - 4.0 K/uL   Monocytes Relative 5 %   Monocytes Absolute 0.4 0 - 1 K/uL   Eosinophils Relative 2 %   Eosinophils Absolute 0.1 0 - 0 K/uL   Basophils Relative 1 %   Basophils Absolute 0.0 0 - 0 K/uL   Immature Granulocytes 0 %   Abs Immature Granulocytes 0.02 0.00 - 0.07 K/uL  Urinalysis, Routine w reflex microscopic  Result Value Ref Range   Color, Urine YELLOW (A) YELLOW   APPearance CLEAR (A) CLEAR   Specific Gravity, Urine 1.011 1.005 - 1.030   pH 9.0 (H) 5.0 - 8.0   Glucose, UA NEGATIVE NEGATIVE mg/dL   Hgb urine dipstick NEGATIVE NEGATIVE   Bilirubin Urine NEGATIVE NEGATIVE   Ketones, ur NEGATIVE NEGATIVE mg/dL   Protein, ur NEGATIVE NEGATIVE mg/dL   Nitrite NEGATIVE NEGATIVE   Leukocytes,Ua NEGATIVE NEGATIVE      Assessment & Plan:   Problem List Items Addressed This Visit      Nervous and Auditory   Acute otitis externa    Acute and improving in symptoms, however moderate drainage remains, which patient reports is more than his baseline.  Will extend Ciprodex x 7 days, new script sent, and  to complete Cefdinir.  Keep fingers out of ear to avoid ongoing infection.  If worsening symptoms immediately return to office.  Plan for follow-up in 7 days.          Follow up plan: Return in about 1 week (around 03/18/2020) for With PCP for otitis externa.

## 2020-03-18 ENCOUNTER — Other Ambulatory Visit: Payer: Self-pay

## 2020-03-18 ENCOUNTER — Ambulatory Visit (INDEPENDENT_AMBULATORY_CARE_PROVIDER_SITE_OTHER): Payer: Medicare Other | Admitting: Family Medicine

## 2020-03-18 ENCOUNTER — Encounter: Payer: Self-pay | Admitting: Family Medicine

## 2020-03-18 VITALS — BP 114/68 | HR 68 | Temp 98.3°F | Ht 73.0 in | Wt 172.8 lb

## 2020-03-18 DIAGNOSIS — M546 Pain in thoracic spine: Secondary | ICD-10-CM | POA: Diagnosis not present

## 2020-03-18 DIAGNOSIS — H60391 Other infective otitis externa, right ear: Secondary | ICD-10-CM

## 2020-03-18 MED ORDER — PREDNISONE 10 MG PO TABS: ORAL_TABLET | ORAL | 0 refills | Status: DC

## 2020-03-18 NOTE — Progress Notes (Signed)
BP 114/68 (BP Location: Left Arm, Patient Position: Sitting, Cuff Size: Normal)   Pulse 68   Temp 98.3 F (36.8 C) (Oral)   Ht 6\' 1"  (1.854 m)   Wt 172 lb 12.8 oz (78.4 kg)   SpO2 99%   BMI 22.80 kg/m    Subjective:    Patient ID: Marcus Wright, male    DOB: [birth year], 64 y.o.   MRN: [id number]  HPI: Marcus Wright is a 64 y.o. male  Chief Complaint  Patient presents with  . Otitis Externa    fu.   Just picked up his new Rx for his ciprodex. Notes that he is feeling better. No swelling or pain. No pus. No fevers. He does note that his back has been hurting. He got radiation back with his cancer to his whole thoracic spine. He feels like it started acting up yesterday. Has been taking his meloxicam without much benefit. He has been having aching and sore pain. Worse with movement and better with rest. No radiation. He is otherwise feeling well with no other concerns or complaints at this time.   Relevant past medical, surgical, family and social history reviewed and updated as indicated. Interim medical history since our last visit reviewed. Allergies and medications reviewed and updated.  Review of Systems  Constitutional: Negative.   HENT: Positive for ear discharge, ear pain and hearing loss. Negative for congestion, dental problem, drooling, facial swelling, mouth sores, nosebleeds, postnasal drip, rhinorrhea, sinus pressure, sinus pain, sneezing, sore throat, tinnitus, trouble swallowing and voice change.   Respiratory: Negative.   Cardiovascular: Negative.   Musculoskeletal: Positive for back pain and myalgias. Negative for arthralgias, gait problem, joint swelling, neck pain and neck stiffness.  Skin: Negative.   Neurological: Negative.   Psychiatric/Behavioral: Negative.     Per HPI unless specifically indicated above     Objective:    BP 114/68 (BP Location: Left Arm, Patient Position: Sitting, Cuff Size: Normal)   Pulse 68   Temp 98.3 F (36.8 C)  (Oral)   Ht 6\' 1"  (1.854 m)   Wt 172 lb 12.8 oz (78.4 kg)   SpO2 99%   BMI 22.80 kg/m   Wt Readings from Last 3 Encounters:  03/18/20 172 lb 12.8 oz (78.4 kg)  03/11/20 169 lb 12.8 oz (77 kg)  03/04/20 170 lb (77.1 kg)    Physical Exam Vitals and nursing note reviewed.  Constitutional:      General: He is not in acute distress.    Appearance: Normal appearance. He is normal weight. He is not ill-appearing, toxic-appearing or diaphoretic.  HENT:     Head: Normocephalic and atraumatic.     Right Ear: External ear normal.     Left Ear: External ear normal.     Ears:     Comments: Mild clear fluid and blood in R EAC    Nose: Nose normal.     Mouth/Throat:     Mouth: Mucous membranes are moist.     Pharynx: Oropharynx is clear.  Eyes:     General: No scleral icterus.       Right eye: No discharge.        Left eye: No discharge.     Extraocular Movements: Extraocular movements intact.     Conjunctiva/sclera: Conjunctivae normal.     Pupils: Pupils are equal, round, and reactive to light.  Cardiovascular:     Rate and Rhythm: Normal rate and regular rhythm.     Pulses: Normal  pulses.     Heart sounds: Normal heart sounds. No murmur heard.  No friction rub. No gallop.   Pulmonary:     Effort: Pulmonary effort is normal. No respiratory distress.     Breath sounds: Normal breath sounds. No stridor. No wheezing, rhonchi or rales.  Chest:     Chest wall: No tenderness.  Musculoskeletal:        General: Normal range of motion.     Cervical back: Normal range of motion and neck supple.  Skin:    General: Skin is warm and dry.     Capillary Refill: Capillary refill takes less than 2 seconds.     Coloration: Skin is not jaundiced or pale.     Findings: No bruising, erythema, lesion or rash.  Neurological:     General: No focal deficit present.     Mental Status: He is alert and oriented to person, place, and time. Mental status is at baseline.  Psychiatric:        Mood and  Affect: Mood normal.        Behavior: Behavior normal.        Thought Content: Thought content normal.        Judgment: Judgment normal.     Results for orders placed or performed during the hospital encounter of 02/25/20  Blood Culture (routine x 2)   Specimen: BLOOD  Result Value Ref Range   Specimen Description BLOOD LEFT ANTECUBITAL    Special Requests      BOTTLES DRAWN AEROBIC AND ANAEROBIC Blood Culture adequate volume   Culture      NO GROWTH 5 DAYS Performed at [facility], [address] [place], [place] [address]    Report Status 03/01/2020 FINAL   Blood Culture (routine x 2)   Specimen: BLOOD  Result Value Ref Range   Specimen Description BLOOD RIGHT ANTECUBITAL    Special Requests      BOTTLES DRAWN AEROBIC AND ANAEROBIC Blood Culture results may not be optimal due to an excessive volume of blood received in culture bottles   Culture      NO GROWTH 5 DAYS Performed at [facility], [address] [place], [place] [address]    Report Status 03/01/2020 FINAL   Urine culture   Specimen: Urine, Random  Result Value Ref Range   Specimen Description      URINE, RANDOM Performed at [facility], [address] [place], [place] [address]    Special Requests      NONE Performed at [facility], [address], [place], [place] [address]    Culture (A)     <10,000 COLONIES/mL INSIGNIFICANT GROWTH Performed at [facility], [address] [place], [place] [address]    Report Status 02/27/2020 FINAL   SARS Coronavirus 2 by RT PCR (hospital order, performed in [place] hospital lab) Nasopharyngeal Nasopharyngeal Swab   Specimen: Nasopharyngeal Swab  Result Value Ref Range   SARS Coronavirus 2 NEGATIVE NEGATIVE  Lactic acid, plasma  Result Value Ref Range   Lactic Acid, Venous 1.5 0.5 - 1.9 mmol/L  Comprehensive metabolic panel  Result Value Ref Range   Sodium 137 135 - 145 mmol/L   Potassium 3.6 3.5 - 5.1  mmol/L   Chloride 101 98 - 111 mmol/L   CO2 26 22 - 32 mmol/L   Glucose, Bld 140 (H) 70 - 99 mg/dL   BUN 11 8 - 23 mg/dL   Creatinine, Ser 1.04  0.61 - 1.24 mg/dL   Calcium 9.3 8.9 - 10.3 mg/dL   Total Protein 7.8 6.5 - 8.1 g/dL   Albumin 4.7 3.5 - 5.0 g/dL   AST 22 15 - 41 U/L   ALT 16 0 - 44 U/L   Alkaline Phosphatase 65 38 - 126 U/L   Total Bilirubin 1.0 0.3 - 1.2 mg/dL   GFR calc non Af Amer >60 >60 mL/min   GFR calc Af Amer >60 >60 mL/min   Anion gap 10 5 - 15  CBC WITH DIFFERENTIAL  Result Value Ref Range   WBC 8.7 4.0 - 10.5 K/uL   RBC 4.56 4.22 - 5.81 MIL/uL   Hemoglobin 13.2 13.0 - 17.0 g/dL   HCT 37.2 (L) 39 - 52 %   MCV 81.6 80.0 - 100.0 fL   MCH 28.9 26.0 - 34.0 pg   MCHC 35.5 30.0 - 36.0 g/dL   RDW 12.8 11.5 - 15.5 %   Platelets 257 150 - 400 K/uL   nRBC 0.0 0.0 - 0.2 %   Neutrophils Relative % 86 %   Neutro Abs 7.5 1.7 - 7.7 K/uL   Lymphocytes Relative 6 %   Lymphs Abs 0.5 (L) 0.7 - 4.0 K/uL   Monocytes Relative 5 %   Monocytes Absolute 0.4 0 - 1 K/uL   Eosinophils Relative 2 %   Eosinophils Absolute 0.1 0 - 0 K/uL   Basophils Relative 1 %   Basophils Absolute 0.0 0 - 0 K/uL   Immature Granulocytes 0 %   Abs Immature Granulocytes 0.02 0.00 - 0.07 K/uL  Urinalysis, Routine w reflex microscopic  Result Value Ref Range   Color, Urine YELLOW (A) YELLOW   APPearance CLEAR (A) CLEAR   Specific Gravity, Urine 1.011 1.005 - 1.030   pH 9.0 (H) 5.0 - 8.0   Glucose, UA NEGATIVE NEGATIVE mg/dL   Hgb urine dipstick NEGATIVE NEGATIVE   Bilirubin Urine NEGATIVE NEGATIVE   Ketones, ur NEGATIVE NEGATIVE mg/dL   Protein, ur NEGATIVE NEGATIVE mg/dL   Nitrite NEGATIVE NEGATIVE   Leukocytes,Ua NEGATIVE NEGATIVE      Assessment & Plan:   Problem List Items Addressed This Visit      Nervous and Auditory   Acute otitis externa - Primary    Improving significantly. No pus. No swelling. Mild moisture. Continue to monitor. Finish ciprodex. Call with any concerns.         Other Visit Diagnoses    Acute bilateral thoracic back pain       In exacerbation. Will treat with prednisone taper. Call if not getting better or getting worse.    Relevant Medications   predniSONE (DELTASONE) 10 MG tablet       Follow up plan: Return As scheduled.

## 2020-03-18 NOTE — Assessment & Plan Note (Signed)
Improving significantly. No pus. No swelling. Mild moisture. Continue to monitor. Finish ciprodex. Call with any concerns.

## 2020-03-20 ENCOUNTER — Emergency Department
Admission: EM | Admit: 2020-03-20 | Discharge: 2020-03-21 | Disposition: A | Discharge status: Home / Self Care | Payer: Medicare Other | Attending: Emergency Medicine | Admitting: Emergency Medicine

## 2020-03-20 ENCOUNTER — Emergency Department: Payer: Medicare Other

## 2020-03-20 ENCOUNTER — Other Ambulatory Visit: Payer: Self-pay

## 2020-03-20 DIAGNOSIS — Z8522 Personal history of malignant neoplasm of nasal cavities, middle ear, and accessory sinuses: Secondary | ICD-10-CM | POA: Diagnosis not present

## 2020-03-20 DIAGNOSIS — R1310 Dysphagia, unspecified: Secondary | ICD-10-CM | POA: Diagnosis not present

## 2020-03-20 DIAGNOSIS — I10 Essential (primary) hypertension: Secondary | ICD-10-CM | POA: Diagnosis not present

## 2020-03-20 DIAGNOSIS — Z96651 Presence of right artificial knee joint: Secondary | ICD-10-CM | POA: Diagnosis not present

## 2020-03-20 DIAGNOSIS — Z85828 Personal history of other malignant neoplasm of skin: Secondary | ICD-10-CM | POA: Insufficient documentation

## 2020-03-20 DIAGNOSIS — R6889 Other general symptoms and signs: Secondary | ICD-10-CM | POA: Diagnosis not present

## 2020-03-20 DIAGNOSIS — T17920A Food in respiratory tract, part unspecified causing asphyxiation, initial encounter: Secondary | ICD-10-CM | POA: Diagnosis not present

## 2020-03-20 DIAGNOSIS — Z743 Need for continuous supervision: Secondary | ICD-10-CM | POA: Diagnosis not present

## 2020-03-20 DIAGNOSIS — Z20822 Contact with and (suspected) exposure to covid-19: Secondary | ICD-10-CM | POA: Diagnosis not present

## 2020-03-20 DIAGNOSIS — Z87891 Personal history of nicotine dependence: Secondary | ICD-10-CM | POA: Insufficient documentation

## 2020-03-20 LAB — COMPREHENSIVE METABOLIC PANEL
ALT: 31 U/L (ref 0–44)
AST: 37 U/L (ref 15–41)
Albumin: 4.8 g/dL (ref 3.5–5.0)
Alkaline Phosphatase: 53 U/L (ref 38–126)
Anion gap: 14 (ref 5–15)
BUN: 20 mg/dL (ref 8–23)
CO2: 24 mmol/L (ref 22–32)
Calcium: 9.5 mg/dL (ref 8.9–10.3)
Chloride: 102 mmol/L (ref 98–111)
Creatinine, Ser: 1.14 mg/dL (ref 0.61–1.24)
GFR calc Af Amer: >60 mL/min (ref >60)
GFR calc non Af Amer: >60 mL/min (ref >60)
Glucose, Bld: 211 mg/dL — ABNORMAL HIGH (ref 70–99)
Potassium: 4.1 mmol/L (ref 3.5–5.1)
Sodium: 140 mmol/L (ref 135–145)
Total Bilirubin: 0.7 mg/dL (ref 0.3–1.2)
Total Protein: 7.9 g/dL (ref 6.5–8.1)

## 2020-03-20 LAB — CBC WITH DIFFERENTIAL/PLATELET
Abs Immature Granulocytes: 0.03 10*3/uL (ref 0.00–0.07)
Basophils Absolute: 0 10*3/uL (ref 0.0–0.1)
Basophils Relative: 0 %
Eosinophils Absolute: 0 10*3/uL (ref 0.0–0.5)
Eosinophils Relative: 0 %
HCT: 35.4 % — ABNORMAL LOW (ref 39.0–52.0)
Hemoglobin: 11.8 g/dL — ABNORMAL LOW (ref 13.0–17.0)
Immature Granulocytes: 0 %
Lymphocytes Relative: 9 %
Lymphs Abs: 0.8 10*3/uL (ref 0.7–4.0)
MCH: 28.7 pg (ref 26.0–34.0)
MCHC: 33.3 g/dL (ref 30.0–36.0)
MCV: 86.1 fL (ref 80.0–100.0)
Monocytes Absolute: 0.2 10*3/uL (ref 0.1–1.0)
Monocytes Relative: 2 %
Neutro Abs: 8.4 10*3/uL — ABNORMAL HIGH (ref 1.7–7.7)
Neutrophils Relative %: 89 %
Platelets: 280 10*3/uL (ref 150–400)
RBC: 4.11 MIL/uL — ABNORMAL LOW (ref 4.22–5.81)
RDW: 13.2 % (ref 11.5–15.5)
WBC: 9.5 10*3/uL (ref 4.0–10.5)
nRBC: 0 % (ref 0.0–0.2)

## 2020-03-20 LAB — TROPONIN I (HIGH SENSITIVITY): Troponin I (High Sensitivity): 4 ng/L (ref <18)

## 2020-03-20 LAB — SARS CORONAVIRUS 2 BY RT PCR (HOSPITAL ORDER, PERFORMED IN ~~LOC~~ HOSPITAL LAB): SARS Coronavirus 2: NEGATIVE

## 2020-03-20 MED ORDER — GLUCAGON HCL RDNA (DIAGNOSTIC) 1 MG IJ SOLR
1.0000 mg | Freq: Once | INTRAMUSCULAR | Status: AC
  Administered 2020-03-20: 1 mg via INTRAVENOUS
  Filled 2020-03-20: qty 1

## 2020-03-20 NOTE — ED Provider Notes (Signed)
[facility] Emergency Department Provider Note  ____________________________________________   First MD Initiated Contact with Patient 03/20/20 2003     (approximate)  I have reviewed the triage vital signs and the nursing notes.   HISTORY  Chief Complaint Choking and Dysphagia    HPI Marcus Wright is a 64 y.o. male with sinus cancer a few years ago, s/p radiation, hearing loss, who comes in for choking.  Per EMS it sounds that patient ate a fruit cup and feels like something got stuck in his throat.  He states that he had some vomiting afterwards cannot tolerate eating.  This occurred just prior to arrival, constant, nothing makes it better, nothing makes it worse.  Denies any chest pain, abdominal pain  I discussed with the son. Normally, the speech impediment is baseline since prior radiation.. Son was at pt house this morning and was doing well no vomiting that the son noted.  He had just left his house so must of just happened within the last two hours. He states his wife recently died and not sure if he just trying to get attention.            Past Medical History:  Diagnosis Date  . Cancer [facility]) 2003   skin'  . Hypertension   . Thyroid disorder   . Wears dentures    full upper  . Wears hearing aid     Patient Active Problem List   Diagnosis Date Noted  . Acute otitis media 03/11/2020  . Acute otitis externa 03/11/2020  . DJD (degenerative joint disease), thoracic 11/22/2018  . Mastoiditis of right side 06/22/2017  . Hearing loss 05/10/2017  . Colonic constipation   . Hyperlipidemia 05/21/2016  . History of sinus cancer   . Hypertension   . Thyroid disorder   . Cervical radicular pain 02/07/2014  . History of neck surgery 02/07/2014  . Gynecomastia, male 06/20/2013    Past Surgical History:  Procedure Laterality Date  . BACK SURGERY  2011  . BREAST SURGERY Right 11-22-12  . CHOLECYSTECTOMY    . COLONOSCOPY    .  COLONOSCOPY WITH PROPOFOL N/A 07/19/2016   Procedure: COLONOSCOPY WITH PROPOFOL;  Surgeon: Lucilla Lame, MD;  Location: [facility];  Service: Endoscopy;  Laterality: N/A;  . REPLACEMENT TOTAL KNEE Right 1993  . SINUS SURGERY WITH INSTATRAK  2003   cancer    Prior to Admission medications   Medication Sig Start Date End Date Taking? Authorizing Provider  amLODipine (NORVASC) 2.5 MG tablet Take 1 tablet (2.5 mg total) by mouth daily. 11/29/19   Johnson, Megan P, DO  ciprofloxacin-dexamethasone (CIPRODEX) OTIC suspension Place 4 drops into the right ear 2 (two) times daily. 03/11/20   Marnee Guarneri T, NP  levothyroxine (SYNTHROID) 50 MCG tablet TAKE 1 TABLET BY MOUTH EVERY DAY 02/20/20   Johnson, Megan P, DO  meloxicam (MOBIC) 15 MG tablet Take 1 tablet (15 mg total) by mouth daily. 11/29/19   Park Liter P, DO  ofloxacin (OCUFLOX) 0.3 % ophthalmic solution  02/13/20   [provider]  predniSONE (DELTASONE) 10 MG tablet 6 tabs today, 5 tabs tomorrow, decrease by 1 every day until gone 03/18/20   Johnson, Megan P, DO  rosuvastatin (CRESTOR) 20 MG tablet Take 1 tablet (20 mg total) by mouth daily. 11/29/19   Park Liter P, DO    Allergies Doxazosin  No family history on file.  Social History Social History   Tobacco Use  .  Smoking status: Former Research scientist (life sciences)  . Smokeless tobacco: Never Used  . Tobacco comment: quit 20+ yrs ago  Vaping Use  . Vaping Use: Never used  Substance Use Topics  . Alcohol use: Not Currently    Comment: on occasion, 1-2 beers or wine occ.   . Drug use: No      Review of Systems Constitutional: No fever/chills Eyes: No visual changes. ENT: Concern for choking on something Cardiovascular: Denies chest pain. Respiratory: Denies shortness of breath. Gastrointestinal: No abdominal pain.  No nausea, no vomiting.  No diarrhea.  No constipation. Genitourinary: Negative for dysuria. Musculoskeletal: Negative for back pain. Skin: Negative for  rash. Neurological: Negative for headaches, focal weakness or numbness. All other ROS negative ____________________________________________   PHYSICAL EXAM:  VITAL SIGNS: Blood pressure (!) 152/94, pulse 91, temperature 98.1 F (36.7 C), temperature source Oral, resp. rate 14, height 6\' 1"  (1.854 m), weight 79.8 kg, SpO2 100 %.   Constitutional: Alert. Well appearing and in no acute distress.  Hard of hearing Eyes: Conjunctivae are normal. EOMI. Head: Atraumatic. Nose: No congestion/rhinnorhea. Mouth/Throat: Mucous membranes are moist.   Neck: No stridor. Trachea Midline. FROM Cardiovascular: Normal rate, regular rhythm. Grossly normal heart sounds.  Good peripheral circulation. Respiratory: Normal respiratory effort.  No retractions. Lungs CTAB. Gastrointestinal: Soft and nontender. No distention. No abdominal bruits.  Musculoskeletal: No lower extremity tenderness nor edema.  No joint effusions. Neurologic: Speech impediment at baseline per son. No gross focal neurologic deficits are appreciated.  Skin:  Skin is warm, dry and intact. No rash noted. Psychiatric: Mood and affect are normal.  Speech impediment GU: Deferred   ____________________________________________   LABS (all labs ordered are listed, but only abnormal results are displayed)  Labs Reviewed  CBC WITH DIFFERENTIAL/PLATELET - Abnormal; Notable for the following components:      Result Value   RBC 4.11 (*)    Hemoglobin 11.8 (*)    HCT 35.4 (*)    Neutro Abs 8.4 (*)    All other components within normal limits  COMPREHENSIVE METABOLIC PANEL - Abnormal; Notable for the following components:   Glucose, Bld 211 (*)    All other components within normal limits  SARS CORONAVIRUS 2 BY RT PCR (HOSPITAL ORDER, [place] [facility])  TROPONIN I (HIGH SENSITIVITY)   ____________________________________________   ED ECG REPORT I, Vanessa Guymon, the attending physician, personally viewed and  interpreted this ECG.  Normal sinus no st elevation, no twi, normal intervals.  ____________________________________________  Ramona, personally viewed and evaluated these images (plain radiographs) as part of my medical decision making, as well as reviewing the written report by the radiologist.  ED MD interpretation:  No PNA   Official radiology report(s): DG Chest Portable 1 View  Result Date: 03/20/2020 CLINICAL DATA:  Choking with difficulty swallowing. EXAM: PORTABLE CHEST 1 VIEW COMPARISON:  Feb 25, 2020 FINDINGS: There is no evidence of acute infiltrate, pleural effusion or pneumothorax. The heart size and mediastinal contours are within normal limits. The visualized skeletal structures are unremarkable. IMPRESSION: No active disease. Electronically Signed   By: Virgina Norfolk M.D.   On: 03/20/2020 20:36    ____________________________________________   PROCEDURES  Procedure(s) performed (including Critical Care):  Procedures   ____________________________________________   INITIAL IMPRESSION / ASSESSMENT AND PLAN / ED COURSE  Juandaniel Manfredo Hollenbach was evaluated in Emergency Department on 03/20/2020 for the symptoms described in the history of present illness. He was evaluated in the  context of the global COVID-19 pandemic, which necessitated consideration that the patient might be at risk for infection with the SARS-CoV-2 virus that causes COVID-19. Institutional protocols and algorithms that pertain to the evaluation of patients at risk for COVID-19 are in a state of rapid change based on information released by regulatory bodies including the CDC and federal and state organizations. These policies and algorithms were followed during the patient's care in the ED.    We will get labs evaluate for electrolyte abnormalities, chest x-ray to evaluate for aspiration, pneumonia.  Does not seem to have any chest pain but will get EKG and a cardiac marker given  somewhat difficult to understand him..  We will give some glucagon in case there is something stuck in his throat and do a trial of p.o. Currently tolerating secretions here. If He is unable to tolerate p.o. will discuss with GI for endoscopy  Chest x-ray is negative.  Labs are reassuring.  No evidence of heart attack.  I have low suspicion given he really never complained of any pain.  His abdomen is soft and nontender low suspicion for acute abdominal process.  Patient was given some glucagon and has been tolerating liquid at this time. He has been here >4 hours without recurrent vomiting.  Discussed with patient and the son about sticking to a soft diet  and giving GI follow-up to discuss possibility of endoscopy especially if he continues to have difficulties with swallowing.  They expressed understanding and felt countable with this plan. Son will pick pt up.  I discussed the provisional nature of ED diagnosis, the treatment so far, the ongoing plan of care, follow up appointments and return precautions with the patient and any family or support people present. They expressed understanding and agreed with the plan, discharged home.           ____________________________________________   FINAL CLINICAL IMPRESSION(S) / ED DIAGNOSES   Final diagnoses:  Dysphagia, unspecified type      MEDICATIONS GIVEN DURING THIS VISIT:  Medications  glucagon (human recombinant) (GLUCAGEN) injection 1 mg (1 mg Intravenous Given 03/20/20 2046)     ED Discharge Orders    None       Note:  This document was prepared using Dragon voice recognition software and may include unintentional dictation errors.   Vanessa [place], MD 03/21/20 1151

## 2020-03-20 NOTE — ED Triage Notes (Signed)
Pt arrives to ED from home via [facility] with c/c of choking/difficulty swallowing. Pt feels that a peace of peach from a fruit cup is stuck in his esophagus and now he cant swallow water. EMS reports transport vitals of 142/98, p100, O2 sat 100% on room air. Upon arrival, pt A&Ox4, NAD, pt has hearing deficit. Dr Jari Pigg at pt bedside.

## 2020-03-20 NOTE — Discharge Instructions (Addendum)
His work-up was reassuring and he was able to tolerate p.o. in the hospital.  He should stick to a soft diet including Jell-O's, soups things that don;t require a lot of chewing to prevent possible chocking again.  He should call the GI doctor for follow-up given patient may need an endoscopy to make sure there is no signs of any stricture reason for the choking event today.  He can return to ER if he develops any other new symptoms or recurrent symptoms

## 2020-03-21 NOTE — ED Notes (Signed)
E signature pad not working. Pt educated on discharge instructions and verbalized understanding.  

## 2020-04-11 ENCOUNTER — Ambulatory Visit (INDEPENDENT_AMBULATORY_CARE_PROVIDER_SITE_OTHER): Payer: Medicare Other | Admitting: Family Medicine

## 2020-04-11 ENCOUNTER — Other Ambulatory Visit: Payer: Self-pay

## 2020-04-11 ENCOUNTER — Encounter: Payer: Self-pay | Admitting: Family Medicine

## 2020-04-11 VITALS — BP 118/78 | HR 74 | Temp 97.9°F | Wt 171.0 lb

## 2020-04-11 DIAGNOSIS — Z8522 Personal history of malignant neoplasm of nasal cavities, middle ear, and accessory sinuses: Secondary | ICD-10-CM | POA: Diagnosis not present

## 2020-04-11 DIAGNOSIS — H66004 Acute suppurative otitis media without spontaneous rupture of ear drum, recurrent, right ear: Secondary | ICD-10-CM | POA: Diagnosis not present

## 2020-04-11 DIAGNOSIS — H6091 Unspecified otitis externa, right ear: Secondary | ICD-10-CM | POA: Diagnosis not present

## 2020-04-11 DIAGNOSIS — Z8669 Personal history of other diseases of the nervous system and sense organs: Secondary | ICD-10-CM | POA: Diagnosis not present

## 2020-04-11 MED ORDER — CIPROFLOXACIN-DEXAMETHASONE 0.3-0.1 % OT SUSP: 4.0000 [drp] | Freq: Two times a day (BID) | OTIC | 0 refills | Status: DC

## 2020-04-11 MED ORDER — CEFDINIR 300 MG PO CAPS: 300.0000 mg | ORAL_CAPSULE | Freq: Two times a day (BID) | ORAL | 0 refills | Status: DC

## 2020-04-11 NOTE — Progress Notes (Signed)
BP 118/78 (BP Location: Left Arm, Patient Position: Sitting, Cuff Size: Normal)   Pulse 74   Temp 97.9 F (36.6 C) (Oral)   Wt 171 lb (77.6 kg)   SpO2 97%   BMI 22.56 kg/m    Subjective:    Patient ID: Marcus Wright, male    DOB: December 03, 1955, 64 y.o.   MRN: [id number]  HPI: Marcus Wright is a 64 y.o. male  Chief Complaint  Patient presents with  . Ear Problem    right ear   EAR PAIN Duration: weeks Involved ear(s): right Severity:  severe  Quality:  Sharp and aching Fever: no Otorrhea: yes Upper respiratory infection symptoms: no Pruritus: no Hearing loss: yes Water immersion no Using Q-tips: no Recurrent otitis media: yes Status: worse Treatments attempted: none  Relevant past medical, surgical, family and social history reviewed and updated as indicated. Interim medical history since our last visit reviewed. Allergies and medications reviewed and updated.  Review of Systems  Constitutional: Negative.   HENT: Positive for congestion, ear discharge and ear pain. Negative for dental problem, drooling, facial swelling, hearing loss, mouth sores, nosebleeds, postnasal drip, rhinorrhea, sinus pressure, sinus pain, sneezing, sore throat, tinnitus, trouble swallowing and voice change.   Eyes: Negative.   Respiratory: Negative.   Cardiovascular: Negative.   Psychiatric/Behavioral: Negative.     Per HPI unless specifically indicated above     Objective:    BP 118/78 (BP Location: Left Arm, Patient Position: Sitting, Cuff Size: Normal)   Pulse 74   Temp 97.9 F (36.6 C) (Oral)   Wt 171 lb (77.6 kg)   SpO2 97%   BMI 22.56 kg/m   Wt Readings from Last 3 Encounters:  04/11/20 171 lb (77.6 kg)  03/20/20 176 lb (79.8 kg)  03/18/20 172 lb 12.8 oz (78.4 kg)    Physical Exam Vitals and nursing note reviewed.  Constitutional:      General: He is not in acute distress.    Appearance: Normal appearance. He is not ill-appearing, toxic-appearing or  diaphoretic.  HENT:     Head: Normocephalic and atraumatic.     Comments: Significant pus and swelling in R EAC, pus behind R TM, significant deformity- no changes    Left Ear: External ear normal.     Nose: Nose normal.     Mouth/Throat:     Mouth: Mucous membranes are moist.     Pharynx: Oropharynx is clear.  Eyes:     General: No scleral icterus.       Right eye: No discharge.        Left eye: No discharge.     Extraocular Movements: Extraocular movements intact.     Conjunctiva/sclera: Conjunctivae normal.     Pupils: Pupils are equal, round, and reactive to light.  Cardiovascular:     Rate and Rhythm: Normal rate and regular rhythm.     Pulses: Normal pulses.     Heart sounds: Normal heart sounds. No murmur heard.  No friction rub. No gallop.   Pulmonary:     Effort: Pulmonary effort is normal. No respiratory distress.     Breath sounds: Normal breath sounds. No stridor. No wheezing, rhonchi or rales.  Chest:     Chest wall: No tenderness.  Musculoskeletal:        General: Normal range of motion.     Cervical back: Normal range of motion and neck supple.  Skin:    General: Skin is warm and dry.  Capillary Refill: Capillary refill takes less than 2 seconds.     Coloration: Skin is not jaundiced or pale.     Findings: No bruising, erythema, lesion or rash.  Neurological:     General: No focal deficit present.     Mental Status: He is alert and oriented to person, place, and time. Mental status is at baseline.  Psychiatric:        Mood and Affect: Mood normal.        Behavior: Behavior normal.        Thought Content: Thought content normal.        Judgment: Judgment normal.     Results for orders placed or performed during the hospital encounter of 03/20/20  SARS Coronavirus 2 by RT PCR (hospital order, performed in [place] hospital lab) Nasopharyngeal Nasopharyngeal Swab   Specimen: Nasopharyngeal Swab  Result Value Ref Range   SARS Coronavirus 2 NEGATIVE  NEGATIVE  CBC with Differential  Result Value Ref Range   WBC 9.5 4.0 - 10.5 K/uL   RBC 4.11 (L) 4.22 - 5.81 MIL/uL   Hemoglobin 11.8 (L) 13.0 - 17.0 g/dL   HCT 35.4 (L) 39 - 52 %   MCV 86.1 80.0 - 100.0 fL   MCH 28.7 26.0 - 34.0 pg   MCHC 33.3 30.0 - 36.0 g/dL   RDW 13.2 11.5 - 15.5 %   Platelets 280 150 - 400 K/uL   nRBC 0.0 0.0 - 0.2 %   Neutrophils Relative % 89 %   Neutro Abs 8.4 (H) 1.7 - 7.7 K/uL   Lymphocytes Relative 9 %   Lymphs Abs 0.8 0.7 - 4.0 K/uL   Monocytes Relative 2 %   Monocytes Absolute 0.2 0 - 1 K/uL   Eosinophils Relative 0 %   Eosinophils Absolute 0.0 0 - 0 K/uL   Basophils Relative 0 %   Basophils Absolute 0.0 0 - 0 K/uL   Immature Granulocytes 0 %   Abs Immature Granulocytes 0.03 0.00 - 0.07 K/uL  Comprehensive metabolic panel  Result Value Ref Range   Sodium 140 135 - 145 mmol/L   Potassium 4.1 3.5 - 5.1 mmol/L   Chloride 102 98 - 111 mmol/L   CO2 24 22 - 32 mmol/L   Glucose, Bld 211 (H) 70 - 99 mg/dL   BUN 20 8 - 23 mg/dL   Creatinine, Ser 1.14 0.61 - 1.24 mg/dL   Calcium 9.5 8.9 - 10.3 mg/dL   Total Protein 7.9 6.5 - 8.1 g/dL   Albumin 4.8 3.5 - 5.0 g/dL   AST 37 15 - 41 U/L   ALT 31 0 - 44 U/L   Alkaline Phosphatase 53 38 - 126 U/L   Total Bilirubin 0.7 0.3 - 1.2 mg/dL   GFR calc non Af Amer >60 >60 mL/min   GFR calc Af Amer >60 >60 mL/min   Anion gap 14 5 - 15  Troponin I (High Sensitivity)  Result Value Ref Range   Troponin I (High Sensitivity) 4 <18 ng/L      Assessment & Plan:   Problem List Items Addressed This Visit      Nervous and Auditory   Acute otitis media - Primary   Relevant Medications   cefdinir (OMNICEF) 300 MG capsule   Other Relevant Orders   Ambulatory referral to Infectious Disease     Other   History of sinus cancer   Relevant Orders   Ambulatory referral to Infectious Disease    Other Visit  Diagnoses    Recurrent otitis externa, right       Will treat again with ciprodex and omnicef. Given repeated  infections will get him into ID to see if they have any input. Await appt.    Relevant Orders   Ambulatory referral to Infectious Disease   History of mastoiditis       Relevant Orders   Ambulatory referral to Infectious Disease       Follow up plan: Return if symptoms worsen or fail to improve.

## 2020-05-08 ENCOUNTER — Ambulatory Visit: Payer: Medicare Other | Attending: Infectious Diseases | Admitting: Infectious Diseases

## 2020-05-08 ENCOUNTER — Other Ambulatory Visit: Payer: Self-pay

## 2020-05-08 ENCOUNTER — Telehealth: Payer: Self-pay | Admitting: Family Medicine

## 2020-05-08 ENCOUNTER — Encounter: Payer: Self-pay | Admitting: Infectious Diseases

## 2020-05-08 VITALS — BP 134/73 | HR 73 | Temp 97.6°F | Resp 16 | Ht 72.0 in | Wt 169.0 lb

## 2020-05-08 DIAGNOSIS — Z87891 Personal history of nicotine dependence: Secondary | ICD-10-CM | POA: Diagnosis not present

## 2020-05-08 DIAGNOSIS — E079 Disorder of thyroid, unspecified: Secondary | ICD-10-CM | POA: Diagnosis not present

## 2020-05-08 DIAGNOSIS — H918X3 Other specified hearing loss, bilateral: Secondary | ICD-10-CM | POA: Diagnosis not present

## 2020-05-08 DIAGNOSIS — Z79899 Other long term (current) drug therapy: Secondary | ICD-10-CM | POA: Insufficient documentation

## 2020-05-08 DIAGNOSIS — Z923 Personal history of irradiation: Secondary | ICD-10-CM | POA: Insufficient documentation

## 2020-05-08 DIAGNOSIS — Z8589 Personal history of malignant neoplasm of other organs and systems: Secondary | ICD-10-CM | POA: Diagnosis not present

## 2020-05-08 DIAGNOSIS — H6531 Chronic mucoid otitis media, right ear: Secondary | ICD-10-CM

## 2020-05-08 DIAGNOSIS — I10 Essential (primary) hypertension: Secondary | ICD-10-CM | POA: Insufficient documentation

## 2020-05-08 DIAGNOSIS — H6690 Otitis media, unspecified, unspecified ear: Secondary | ICD-10-CM | POA: Insufficient documentation

## 2020-05-08 DIAGNOSIS — Z791 Long term (current) use of non-steroidal anti-inflammatories (NSAID): Secondary | ICD-10-CM | POA: Diagnosis not present

## 2020-05-08 DIAGNOSIS — H9211 Otorrhea, right ear: Secondary | ICD-10-CM | POA: Diagnosis not present

## 2020-05-08 NOTE — Telephone Encounter (Signed)
Can you see if you can get him and his wife to come in so we can talk about his treatment ASAP?

## 2020-05-08 NOTE — Patient Instructions (Addendum)
You have a complicated ear history- the chronic mucoid discharge is not getting better with antibiotic ear drops or oral antibiotics- You need to go back to [facility]- I have conveyed this information to your PCP Dr.Johnson

## 2020-05-08 NOTE — Telephone Encounter (Signed)
Copied from [place] [id number]. Topic: General - Other >> May 08, 2020 11:02 AM Alanda Slim E wrote: Reason for CRM: Dr. Delaine Lame with infectious disease needs to speak with Dr. Wynetta Emery right away about the Pt that is currently at their office/ she has a couple question/ please call now [phone]

## 2020-05-08 NOTE — Telephone Encounter (Signed)
Alerted his wife recently passed away. Can we see if his son or someone can come with him to appointment to aide with communication.

## 2020-05-08 NOTE — Progress Notes (Signed)
NAME: Marcus Wright  DOB: 09-19-1956  MRN: [id number]  Date/Time: 05/08/2020 11:07 AM  REQUESTING PROVIDER: Dr. Wynetta Emery Subjective:  REASON FOR CONSULT: Chronic right ear mucoid discharge. ? Marcus Wright is a 64 y.o. 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 He used to follow at [facility] but was last seen there in 2018.   During that visit it was noted to have canal erosion medially on the left.  On the right there was an external canal mass and it was biopsied and it was benign.  It was thought to be the cause of the drainage.  They had recommended surgery for the chronic otitis media on the right and hearing loss bilaterally.  Patient was not interested in getting cochlear implant or having the surgery. His PCP has been giving me giving him year drops as well as oral antibiotic with no improvement in the mucoid discharge.  I am asked to see the patient for the same.  Patient is totally deaf and I communicated with him by writing. He has no fever or chills.  He has had chronic discharge from his right ear.  There is no pain. He has difficulty in swallowing since he had radiation. He lost his wife this year due to COPD.  He lives on his own. He is a non-smoker Occasional alcohol.   Past Medical History:  Diagnosis Date  . Cancer [facility]) 2003   skin'  . Hypertension   . Thyroid disorder   . Wears dentures    full upper  . Wears hearing aid     Past Surgical History:  Procedure Laterality Date  . BACK SURGERY  2011  . BREAST SURGERY Right 11-22-12  . CHOLECYSTECTOMY    . COLONOSCOPY    . COLONOSCOPY WITH PROPOFOL N/A 07/19/2016   Procedure: COLONOSCOPY WITH PROPOFOL;  Surgeon: Lucilla Lame, MD;  Location: [place];  Service: Endoscopy;   Laterality: N/A;  . REPLACEMENT TOTAL KNEE Right 1993  . SINUS SURGERY WITH INSTATRAK  2003   cancer    Social History   Socioeconomic History  . Marital status: Married    Spouse name: Not on file  . Number of children: Not on file  . Years of education: Not on file  . Highest education level: Not on file  Occupational History  . Not on file  Tobacco Use  . Smoking status: Former Research scientist (life sciences)  . Smokeless tobacco: Never Used  . Tobacco comment: quit 20+ yrs ago  Vaping Use  . Vaping Use: Never used  Substance and Sexual Activity  . Alcohol use: Not Currently    Comment: on occasion, 1-2 beers or wine occ.   . Drug use: No  . Sexual activity: Not on file  Other Topics Concern  . Not on file  Social History Narrative  . Not on file   Social Determinants of Health   Financial Resource Strain: Low Risk   . Difficulty of Paying Living Expenses: Not very hard  Food Insecurity: No Food Insecurity  . Worried About Charity fundraiser in the Last Year: Never true  . Ran Out of Food in the Last Year: Never true  Transportation Needs: No Transportation Needs  . Lack of Transportation (Medical): No  . Lack of Transportation (Non-Medical):  No  Physical Activity:   . Days of Exercise per Week:   . Minutes of Exercise per Session:   Stress:   . Feeling of Stress :   Social Connections:   . Frequency of Communication with Friends and Family:   . Frequency of Social Gatherings with Friends and Family:   . Attends Religious Services:   . Active Member of Clubs or Organizations:   . Attends Archivist Meetings:   Marland Kitchen Marital Status:   Intimate Partner Violence:   . Fear of Current or Ex-Partner:   . Emotionally Abused:   Marland Kitchen Physically Abused:   . Sexually Abused:     No family history on file. Allergies  Allergen Reactions  . Doxazosin Other (See Comments)    Unknown     ? Current Outpatient Medications  Medication Sig Dispense Refill  . amLODipine (NORVASC) 2.5  MG tablet Take 1 tablet (2.5 mg total) by mouth daily. 90 tablet 1  . levothyroxine (SYNTHROID) 50 MCG tablet TAKE 1 TABLET BY MOUTH EVERY DAY 90 tablet 3  . meloxicam (MOBIC) 15 MG tablet Take 1 tablet (15 mg total) by mouth daily. 90 tablet 1  . ofloxacin (OCUFLOX) 0.3 % ophthalmic solution     . rosuvastatin (CRESTOR) 20 MG tablet Take 1 tablet (20 mg total) by mouth daily. 90 tablet 1  . cefdinir (OMNICEF) 300 MG capsule Take 1 capsule (300 mg total) by mouth 2 (two) times daily. (Patient not taking: Reported on 05/08/2020) 14 capsule 0  . ciprofloxacin-dexamethasone (CIPRODEX) OTIC suspension Place 4 drops into the right ear 2 (two) times daily. (Patient not taking: Reported on 05/08/2020) 7.5 mL 0   No current facility-administered medications for this visit.     Abtx:  Anti-infectives (From admission, onward)   None      REVIEW OF SYSTEMS:  As above.  Rest of the systems negative Objective:  VITALS:  BP 134/73   Pulse 73   Temp 97.6 F (36.4 C)   Resp 16   Ht 6' (1.829 m)   Wt 169 lb (76.7 kg)   SpO2 98%   BMI 22.92 kg/m  PHYSICAL EXAM:  General: Alert, cooperative, no distress,  Head: Normocephalic, without obvious abnormality, atraumatic. Eyes: Conjunctivae clear, anicteric sclerae. Pupils are equal, full range of eye movements. ENT: Deafness both ears.  Nares normal. No drainage or sinus tenderness. Right ear there is perforated eardrum.  Unable to appreciate any discharge currently. Neck: Supple, hardening of tissue the neck with cervical lymph node palpable on the left side Lungs: Clear to auscultation bilaterally. No Wheezing or Rhonchi. No rales. Heart: Regular rate and rhythm, no murmur, rub or gallop. Abdomen: Soft, non-tender,not distended. Bowel sounds normal. No masses Extremities: atraumatic, no cyanosis. No edema. No clubbing Skin: Minimal evaluation but looked okay Neurologic: Grossly non-focal ? Impression/Recommendation ?64 y.o. 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 His findings have been chronic.  I did not see any acute infection currently.  There is no evidence of acute mastoiditis or cerebral abscess clinically. I doubt whether oral antibiotics is going to be effective in chronic mucoid discharge. Recommend follow-up with [facility] ENT for further management. Called his PCP Dr. Wynetta Emery and explained the situation. ? ?Discussed with patient in detail. Advised him  on corona vaccination and gave him information of the [facility] where he could get it as a walk-in. _Note:  This document was prepared using Dragon voice recognition software and may include unintentional dictation errors.

## 2020-05-09 NOTE — Telephone Encounter (Signed)
Unable to lvm to make this apt. And see if some one can come with him.

## 2020-05-12 NOTE — Telephone Encounter (Signed)
lvm to make this apt. And see if some one can come with him.

## 2020-05-13 NOTE — Telephone Encounter (Signed)
Due to phone issues unable to hear pt wanted to speak to him to make this apt. And see if some one can come with him

## 2020-05-14 NOTE — Telephone Encounter (Signed)
Several attempts have been made, Pt hung up the phone on me today after saying I was calling from [place] family.

## 2020-05-15 NOTE — Telephone Encounter (Signed)
Can we please call his son. He is incredibly hard of hearing.

## 2020-05-15 NOTE — Telephone Encounter (Signed)
Spoke with son, Verbalized understanding, DPR compliant . Pt has apt on 05/16/2020

## 2020-05-16 ENCOUNTER — Other Ambulatory Visit: Payer: Self-pay

## 2020-05-16 ENCOUNTER — Encounter: Payer: Self-pay | Admitting: Family Medicine

## 2020-05-16 ENCOUNTER — Ambulatory Visit (INDEPENDENT_AMBULATORY_CARE_PROVIDER_SITE_OTHER): Payer: Medicare Other | Admitting: Family Medicine

## 2020-05-16 VITALS — BP 98/61 | HR 73 | Temp 98.1°F | Wt 168.8 lb

## 2020-05-16 DIAGNOSIS — H6091 Unspecified otitis externa, right ear: Secondary | ICD-10-CM | POA: Diagnosis not present

## 2020-05-16 DIAGNOSIS — Z8669 Personal history of other diseases of the nervous system and sense organs: Secondary | ICD-10-CM | POA: Diagnosis not present

## 2020-05-16 DIAGNOSIS — Z8522 Personal history of malignant neoplasm of nasal cavities, middle ear, and accessory sinuses: Secondary | ICD-10-CM | POA: Diagnosis not present

## 2020-05-16 NOTE — Progress Notes (Signed)
BP 98/61   Pulse 73   Temp 98.1 F (36.7 C) (Oral)   Wt 168 lb 12.8 oz (76.6 kg)   SpO2 99%   BMI 22.89 kg/m    Subjective:    Patient ID: Marcus Wright, male    DOB: Jun 29, 1956, 64 y.o.   MRN: [id number]  HPI: Marcus Wright is a 64 y.o. male  Chief Complaint  Patient presents with  . Ear Pain    follow up- R ear   Marcus Wright has had bad reactions with ENTs before. Whenever he has gone to see them, they have been very focused on maintaining what little hearing he has left- discussing cochlear implants. This is not covered by his insurance and he is not interested in paying for it out of pocket. He has hearing in his R ear and would like to maintain it. He is most concerned about not getting infections in his ear and being able to control the drainage that is associated with his previous radiation. He is feeling well today. He notes that he has not had any drainage. No other concerns or complaints at this time.   Relevant past medical, surgical, family and social history reviewed and updated as indicated. Interim medical history since our last visit reviewed. Allergies and medications reviewed and updated.  Review of Systems  HENT: Positive for ear discharge, hearing loss and trouble swallowing. Negative for congestion, dental problem, drooling, ear pain, facial swelling, mouth sores, nosebleeds, postnasal drip, rhinorrhea, sinus pressure, sinus pain, sneezing, sore throat, tinnitus and voice change.   Respiratory: Negative.   Cardiovascular: Negative.   Gastrointestinal: Negative.   Psychiatric/Behavioral: Negative.     Per HPI unless specifically indicated above     Objective:    BP 98/61   Pulse 73   Temp 98.1 F (36.7 C) (Oral)   Wt 168 lb 12.8 oz (76.6 kg)   SpO2 99%   BMI 22.89 kg/m   Wt Readings from Last 3 Encounters:  05/16/20 168 lb 12.8 oz (76.6 kg)  05/08/20 169 lb (76.7 kg)  04/11/20 171 lb (77.6 kg)    Physical Exam Vitals and nursing note  reviewed.  Constitutional:      General: He is not in acute distress.    Appearance: Normal appearance. He is not ill-appearing, toxic-appearing or diaphoretic.  HENT:     Head: Normocephalic and atraumatic.     Right Ear: External ear normal.     Left Ear: External ear normal.     Nose: Nose normal.     Mouth/Throat:     Mouth: Mucous membranes are moist.     Pharynx: Oropharynx is clear.  Eyes:     General: No scleral icterus.       Right eye: No discharge.        Left eye: No discharge.     Extraocular Movements: Extraocular movements intact.     Conjunctiva/sclera: Conjunctivae normal.     Pupils: Pupils are equal, round, and reactive to light.  Cardiovascular:     Rate and Rhythm: Normal rate and regular rhythm.     Pulses: Normal pulses.     Heart sounds: Normal heart sounds. No murmur heard.  No friction rub. No gallop.   Pulmonary:     Effort: Pulmonary effort is normal. No respiratory distress.     Breath sounds: Normal breath sounds. No stridor. No wheezing, rhonchi or rales.  Chest:     Chest wall: No tenderness.  Musculoskeletal:  General: Normal range of motion.     Cervical back: Normal range of motion and neck supple.  Skin:    General: Skin is warm and dry.     Capillary Refill: Capillary refill takes less than 2 seconds.     Coloration: Skin is not jaundiced or pale.     Findings: No bruising, erythema, lesion or rash.  Neurological:     General: No focal deficit present.     Mental Status: He is alert and oriented to person, place, and time. Mental status is at baseline.  Psychiatric:        Mood and Affect: Mood normal.        Behavior: Behavior normal.        Thought Content: Thought content normal.        Judgment: Judgment normal.     Results for orders placed or performed during the hospital encounter of 03/20/20  SARS Coronavirus 2 by RT PCR (hospital order, performed in [place] hospital lab) Nasopharyngeal Nasopharyngeal Swab    Specimen: Nasopharyngeal Swab  Result Value Ref Range   SARS Coronavirus 2 NEGATIVE NEGATIVE  CBC with Differential  Result Value Ref Range   WBC 9.5 4.0 - 10.5 K/uL   RBC 4.11 (L) 4.22 - 5.81 MIL/uL   Hemoglobin 11.8 (L) 13.0 - 17.0 g/dL   HCT 35.4 (L) 39 - 52 %   MCV 86.1 80.0 - 100.0 fL   MCH 28.7 26.0 - 34.0 pg   MCHC 33.3 30.0 - 36.0 g/dL   RDW 13.2 11.5 - 15.5 %   Platelets 280 150 - 400 K/uL   nRBC 0.0 0.0 - 0.2 %   Neutrophils Relative % 89 %   Neutro Abs 8.4 (H) 1.7 - 7.7 K/uL   Lymphocytes Relative 9 %   Lymphs Abs 0.8 0.7 - 4.0 K/uL   Monocytes Relative 2 %   Monocytes Absolute 0.2 0 - 1 K/uL   Eosinophils Relative 0 %   Eosinophils Absolute 0.0 0 - 0 K/uL   Basophils Relative 0 %   Basophils Absolute 0.0 0 - 0 K/uL   Immature Granulocytes 0 %   Abs Immature Granulocytes 0.03 0.00 - 0.07 K/uL  Comprehensive metabolic panel  Result Value Ref Range   Sodium 140 135 - 145 mmol/L   Potassium 4.1 3.5 - 5.1 mmol/L   Chloride 102 98 - 111 mmol/L   CO2 24 22 - 32 mmol/L   Glucose, Bld 211 (H) 70 - 99 mg/dL   BUN 20 8 - 23 mg/dL   Creatinine, Ser 1.14 0.61 - 1.24 mg/dL   Calcium 9.5 8.9 - 10.3 mg/dL   Total Protein 7.9 6.5 - 8.1 g/dL   Albumin 4.8 3.5 - 5.0 g/dL   AST 37 15 - 41 U/L   ALT 31 0 - 44 U/L   Alkaline Phosphatase 53 38 - 126 U/L   Total Bilirubin 0.7 0.3 - 1.2 mg/dL   GFR calc non Af Amer >60 >60 mL/min   GFR calc Af Amer >60 >60 mL/min   Anion gap 14 5 - 15  Troponin I (High Sensitivity)  Result Value Ref Range   Troponin I (High Sensitivity) 4 <18 ng/L      Assessment & Plan:   Problem List Items Addressed This Visit      Nervous and Auditory   Recurrent otitis externa, right - Primary    Will get him into [facility] ENT at his request. Long discussion with  him and his son today about how this is not an infection, but due to his radiation. He is not interested in cochlear implants, but would like to discuss how to keep the leakage under  control and avoid infections. Continue to monitor. Call with any concerns.       Relevant Orders   Ambulatory referral to ENT     Other   History of sinus cancer   Relevant Orders   Ambulatory referral to ENT    Other Visit Diagnoses    History of mastoiditis       Relevant Orders   Ambulatory referral to ENT       Follow up plan: Return as scheduled.   >40 minutes spent with patient today

## 2020-05-18 ENCOUNTER — Encounter: Payer: Self-pay | Admitting: Family Medicine

## 2020-05-18 NOTE — Assessment & Plan Note (Signed)
Will get him into [facility] ENT at his request. Long discussion with him and his son today about how this is not an infection, but due to his radiation. He is not interested in cochlear implants, but would like to discuss how to keep the leakage under control and avoid infections. Continue to monitor. Call with any concerns.

## 2020-05-21 ENCOUNTER — Other Ambulatory Visit: Payer: Self-pay | Admitting: Family Medicine

## 2020-05-21 NOTE — Telephone Encounter (Signed)
LOV: 05/16/20

## 2020-05-21 NOTE — Telephone Encounter (Signed)
Requested medications are due for refill today?  Yes  Requested medications are on active medication list?  Yes  Last Refill:   11/29/2019  # 90 with one refill   Future visit scheduled?  Yes  Notes to Clinic:  Medication failed RX refill protocol due to abnormal Hgb.  Please review.  Thank you.

## 2020-05-24 ENCOUNTER — Other Ambulatory Visit: Payer: Self-pay | Admitting: Family Medicine

## 2020-05-24 NOTE — Telephone Encounter (Signed)
Requested Prescriptions  Pending Prescriptions Disp Refills  . rosuvastatin (CRESTOR) 20 MG tablet [Pharmacy Med Name: ROSUVASTATIN CALCIUM 20 MG TAB] 90 tablet 0    Sig: TAKE 1 TABLET BY MOUTH EVERY DAY     Cardiovascular:  Antilipid - Statins Failed - 05/24/2020  8:52 AM      Failed - LDL in normal range and within 360 days    LDL Chol Calc (NIH)  Date Value Ref Range Status  11/29/2019 79 0 - 99 mg/dL Final         Failed - Triglycerides in normal range and within 360 days    Triglycerides  Date Value Ref Range Status  11/29/2019 171 (H) 0 - 149 mg/dL Final   Triglycerides Piccolo,Waived  Date Value Ref Range Status  05/21/2016 292 (H) <150 mg/dL Final    Comment:                            Normal                   <150                         Borderline High     150 - 199                         High                200 - 499                         Very High                >499          Passed - Total Cholesterol in normal range and within 360 days    Cholesterol, Total  Date Value Ref Range Status  11/29/2019 148 100 - 199 mg/dL Final   Cholesterol Piccolo, Waived  Date Value Ref Range Status  05/21/2016 251 (H) <200 mg/dL Final    Comment:                            Desirable                <200                         Borderline High      200- 239                         High                     >239          Passed - HDL in normal range and within 360 days    HDL  Date Value Ref Range Status  11/29/2019 40 >39 mg/dL Final         Passed - Patient is not pregnant      Passed - Valid encounter within last 12 months    Recent Outpatient Visits          1 week ago Recurrent otitis externa, right   [place], Megan P, DO   1 month ago Recurrent acute suppurative otitis media of right ear  without spontaneous rupture of tympanic membrane   [facility] [place], Megan P, DO   2 months ago Other infective acute otitis externa  of right ear   [facility] [place], Megan P, DO   2 months ago Other infective acute otitis externa of right ear   [place], Henrine Screws T, NP   2 months ago Acute infective otitis externa, right   [facility] [place], [place], DO      Future Appointments            In 1 week Wynetta Emery, Barb Merino, DO [facility]

## 2020-06-03 ENCOUNTER — Ambulatory Visit (INDEPENDENT_AMBULATORY_CARE_PROVIDER_SITE_OTHER): Payer: Medicare Other | Admitting: Family Medicine

## 2020-06-03 ENCOUNTER — Other Ambulatory Visit: Payer: Self-pay

## 2020-06-03 ENCOUNTER — Encounter: Payer: Self-pay | Admitting: Family Medicine

## 2020-06-03 VITALS — BP 116/75 | HR 81 | Temp 98.2°F | Ht 72.44 in | Wt 170.4 lb

## 2020-06-03 DIAGNOSIS — E079 Disorder of thyroid, unspecified: Secondary | ICD-10-CM | POA: Diagnosis not present

## 2020-06-03 DIAGNOSIS — I10 Essential (primary) hypertension: Secondary | ICD-10-CM | POA: Diagnosis not present

## 2020-06-03 DIAGNOSIS — Z23 Encounter for immunization: Secondary | ICD-10-CM

## 2020-06-03 DIAGNOSIS — Z125 Encounter for screening for malignant neoplasm of prostate: Secondary | ICD-10-CM

## 2020-06-03 DIAGNOSIS — Z0000 Encounter for general adult medical examination without abnormal findings: Secondary | ICD-10-CM

## 2020-06-03 DIAGNOSIS — E782 Mixed hyperlipidemia: Secondary | ICD-10-CM

## 2020-06-03 LAB — URINALYSIS, ROUTINE W REFLEX MICROSCOPIC
Bilirubin, UA: NEGATIVE
Glucose, UA: NEGATIVE
Ketones, UA: NEGATIVE
Leukocytes,UA: NEGATIVE
Nitrite, UA: NEGATIVE
Protein,UA: NEGATIVE
RBC, UA: NEGATIVE
Specific Gravity, UA: 1.01 (ref 1.005–1.030)
Urobilinogen, Ur: 0.2 mg/dL (ref 0.2–1.0)
pH, UA: 6 (ref 5.0–7.5)

## 2020-06-03 LAB — MICROALBUMIN, URINE WAIVED
Creatinine, Urine Waived: 50 mg/dL (ref 10–300)
Microalb, Ur Waived: 10 mg/L (ref 0–19)
Microalb/Creat Ratio: <30 mg/g (ref <30)

## 2020-06-03 MED ORDER — AMLODIPINE BESYLATE 2.5 MG PO TABS
2.5000 mg | ORAL_TABLET | Freq: Every day | ORAL | 1 refills | Status: DC
Start: 2020-06-03 — End: 2020-09-05

## 2020-06-03 MED ORDER — ROSUVASTATIN CALCIUM 20 MG PO TABS
20.0000 mg | ORAL_TABLET | Freq: Every day | ORAL | 1 refills | Status: DC
Start: 2020-06-03 — End: 2021-01-24

## 2020-06-03 NOTE — Patient Instructions (Addendum)
Preventative Services:  Health Risk Assessment and Personalized Prevention Plan: Done today Bone Mass Measurements: N/A CVD Screening: Done today Colon Cancer Screening: Up to date Depression Screening: Done today Diabetes Screening: Done today Glaucoma Screening: See your eye doctor Hepatitis B vaccine: N/A Hepatitis C screening: Up to date HIV Screening: Up to date Flu Vaccine: Done today Lung cancer Screening: N/A Obesity Screening: Done today Pneumonia Vaccines (2): Next due in March STI Screening: N/A PSA screening: Done today   Health Maintenance After Age 64 After age 27, you are at a higher risk for certain long-term diseases and infections as well as injuries from falls. Falls are a major cause of broken bones and head injuries in people who are older than age 67. Getting regular preventive care can help to keep you healthy and well. Preventive care includes getting regular testing and making lifestyle changes as recommended by your health care provider. Talk with your health care provider about:  Which screenings and tests you should have. A screening is a test that checks for a disease when you have no symptoms.  A diet and exercise plan that is right for you. What should I know about screenings and tests to prevent falls? Screening and testing are the best ways to find a health problem early. Early diagnosis and treatment give you the best chance of managing medical conditions that are common after age 21. Certain conditions and lifestyle choices may make you more likely to have a fall. Your health care provider may recommend:  Regular vision checks. Poor vision and conditions such as cataracts can make you more likely to have a fall. If you wear glasses, make sure to get your prescription updated if your vision changes.  Medicine review. Work with your health care provider to regularly review all of the medicines you are taking, including over-the-counter medicines. Ask  your health care provider about any side effects that may make you more likely to have a fall. Tell your health care provider if any medicines that you take make you feel dizzy or sleepy.  Osteoporosis screening. Osteoporosis is a condition that causes the bones to get weaker. This can make the bones weak and cause them to break more easily.  Blood pressure screening. Blood pressure changes and medicines to control blood pressure can make you feel dizzy.  Strength and balance checks. Your health care provider may recommend certain tests to check your strength and balance while standing, walking, or changing positions.  Foot health exam. Foot pain and numbness, as well as not wearing proper footwear, can make you more likely to have a fall.  Depression screening. You may be more likely to have a fall if you have a fear of falling, feel emotionally low, or feel unable to do activities that you used to do.  Alcohol use screening. Using too much alcohol can affect your balance and may make you more likely to have a fall. What actions can I take to lower my risk of falls? General instructions  Talk with your health care provider about your risks for falling. Tell your health care provider if: ? You fall. Be sure to tell your health care provider about all falls, even ones that seem minor. ? You feel dizzy, sleepy, or off-balance.  Take over-the-counter and prescription medicines only as told by your health care provider. These include any supplements.  Eat a healthy diet and maintain a healthy weight. A healthy diet includes low-fat dairy products, low-fat (lean) meats, and  fiber from whole grains, beans, and lots of fruits and vegetables. Home safety  Remove any tripping hazards, such as rugs, cords, and clutter.  Install safety equipment such as grab bars in bathrooms and safety rails on stairs.  Keep rooms and walkways well-lit. Activity   Follow a regular exercise program to stay fit.  This will help you maintain your balance. Ask your health care provider what types of exercise are appropriate for you.  If you need a cane or walker, use it as recommended by your health care provider.  Wear supportive shoes that have nonskid soles. Lifestyle  Do not drink alcohol if your health care provider tells you not to drink.  If you drink alcohol, limit how much you have: ? 0-1 drink a day for women. ? 0-2 drinks a day for men.  Be aware of how much alcohol is in your drink. In the [place] one drink equals one typical bottle of beer (12 oz), one-half glass of wine (5 oz), or one shot of hard liquor (1 oz).  Do not use any products that contain nicotine or tobacco, such as cigarettes and e-cigarettes. If you need help quitting, ask your health care provider. Summary  Having a healthy lifestyle and getting preventive care can help to protect your health and wellness after age 54.  Screening and testing are the best way to find a health problem early and help you avoid having a fall. Early diagnosis and treatment give you the best chance for managing medical conditions that are more common for people who are older than age 37.  Falls are a major cause of broken bones and head injuries in people who are older than age 35. Take precautions to prevent a fall at home.  Work with your health care provider to learn what changes you can make to improve your health and wellness and to prevent falls. This information is not intended to replace advice given to you by your health care provider. Make sure you discuss any questions you have with your health care provider. Document Revised: 01/04/2019 Document Reviewed: 07/27/2017 Elsevier Patient Education  2020 [employer].

## 2020-06-03 NOTE — Progress Notes (Signed)
BP 116/75 (BP Location: Left Arm, Patient Position: Sitting, Cuff Size: Normal)   Pulse 81   Temp 98.2 F (36.8 C) (Oral)   Ht 6' 0.44" (1.84 m)   Wt 170 lb 6.4 oz (77.3 kg)   SpO2 98%   BMI 22.83 kg/m    Subjective:    Patient ID: Marcus Wright, male    DOB: 09-01-1956, 64 y.o.   MRN: [id number]  HPI: Marcus Wright is a 64 y.o. male presenting on 06/03/2020 for comprehensive medical examination. Current medical complaints include:  HYPOTHYROIDISM Thyroid control status:stable Satisfied with current treatment? yes Medication side effects: yes Medication compliance: excellent compliance Recent dose adjustment:no Fatigue: no Cold intolerance: no Heat intolerance: no Weight gain: no Weight loss: no Constipation: no Diarrhea/loose stools: no Palpitations: no Lower extremity edema: no Anxiety/depressed mood: no  HYPERTENSION / HYPERLIPIDEMIA Satisfied with current treatment? yes Duration of hypertension: chronic BP monitoring frequency: not checking BP medication side effects: no Past BP meds: amlodipine Duration of hyperlipidemia: chronic Cholesterol medication side effects: no Cholesterol supplements: none Past cholesterol medications: crestor Medication compliance: excellent compliance Aspirin: no Recent stressors: no Recurrent headaches: no Visual changes: no Palpitations: no Dyspnea: no Chest pain: no Lower extremity edema: no Dizzy/lightheaded: no  He currently lives with: alone Interim Problems from his last visit: no  Functional Status Survey: Is the patient deaf or have difficulty hearing?: Yes Does the patient have difficulty seeing, even when wearing glasses/contacts?: No Does the patient have difficulty concentrating, remembering, or making decisions?: No Does the patient have difficulty walking or climbing stairs?: No Does the patient have difficulty dressing or bathing?: No Does the patient have difficulty doing errands alone such as  visiting a doctor's office or shopping?: No  FALL RISK: Fall Risk  06/03/2020 05/28/2019 05/22/2018 12/20/2016  Falls in the past year? 1 1 Yes No  Number falls in past yr: 0 0 2 or more -  Injury with Fall? 0 1 Yes -    Depression Screen Depression screen [facility] 2/9 06/03/2020 05/28/2019 05/22/2018 12/20/2016  Decreased Interest 0 0 3 0  Down, Depressed, Hopeless 0 0 0 0  PHQ - 2 Score 0 0 3 0  Altered sleeping 0 - 1 -  Tired, decreased energy 0 - 0 -  Change in appetite 0 - 0 -  Feeling bad or failure about yourself  0 - 0 -  Trouble concentrating 0 - 0 -  Moving slowly or fidgety/restless 0 - 0 -  Suicidal thoughts 0 - 0 -  PHQ-9 Score 0 - 4 -  Difficult doing work/chores Not difficult at all - Not difficult at all -    Advanced Directives Not on file  Past Medical History:  Past Medical History:  Diagnosis Date  . Cancer [facility]) 2003   skin'  . Hypertension   . Thyroid disorder   . Wears dentures    full upper  . Wears hearing aid     Surgical History:  Past Surgical History:  Procedure Laterality Date  . BACK SURGERY  2011  . BREAST SURGERY Right 11-22-12  . CHOLECYSTECTOMY    . COLONOSCOPY    . COLONOSCOPY WITH PROPOFOL N/A 07/19/2016   Procedure: COLONOSCOPY WITH PROPOFOL;  Surgeon: Lucilla Lame, MD;  Location: [place];  Service: Endoscopy;  Laterality: N/A;  . REPLACEMENT TOTAL KNEE Right 1993  . SINUS SURGERY WITH INSTATRAK  2003   cancer    Medications:  Current Outpatient Medications on File Prior  to Visit  Medication Sig  . levothyroxine (SYNTHROID) 50 MCG tablet TAKE 1 TABLET BY MOUTH EVERY DAY  . meloxicam (MOBIC) 15 MG tablet TAKE 1 TABLET BY MOUTH EVERY DAY  . ofloxacin (OCUFLOX) 0.3 % ophthalmic solution    No current facility-administered medications on file prior to visit.    Allergies:  Allergies  Allergen Reactions  . Doxazosin Hives    Unknown     Social History:  Social History   Socioeconomic History  . Marital status:  Widowed    Spouse name: Not on file  . Number of children: Not on file  . Years of education: Not on file  . Highest education level: Not on file  Occupational History  . Not on file  Tobacco Use  . Smoking status: Former Research scientist (life sciences)  . Smokeless tobacco: Never Used  . Tobacco comment: quit 20+ yrs ago  Vaping Use  . Vaping Use: Never used  Substance and Sexual Activity  . Alcohol use: Not Currently    Comment: on occasion, 1-2 beers or wine occ.   . Drug use: No  . Sexual activity: Not on file  Other Topics Concern  . Not on file  Social History Narrative  . Not on file   Social Determinants of Health   Financial Resource Strain:   . Difficulty of Paying Living Expenses: Not on file  Food Insecurity:   . Worried About Charity fundraiser in the Last Year: Not on file  . Ran Out of Food in the Last Year: Not on file  Transportation Needs:   . Lack of Transportation (Medical): Not on file  . Lack of Transportation (Non-Medical): Not on file  Physical Activity:   . Days of Exercise per Week: Not on file  . Minutes of Exercise per Session: Not on file  Stress:   . Feeling of Stress : Not on file  Social Connections:   . Frequency of Communication with Friends and Family: Not on file  . Frequency of Social Gatherings with Friends and Family: Not on file  . Attends Religious Services: Not on file  . Active Member of Clubs or Organizations: Not on file  . Attends Archivist Meetings: Not on file  . Marital Status: Not on file  Intimate Partner Violence:   . Fear of Current or Ex-Partner: Not on file  . Emotionally Abused: Not on file  . Physically Abused: Not on file  . Sexually Abused: Not on file   Social History   Tobacco Use  Smoking Status Former Smoker  Smokeless Tobacco Never Used  Tobacco Comment   quit 20+ yrs ago   Social History   Substance and Sexual Activity  Alcohol Use Not Currently   Comment: on occasion, 1-2 beers or wine occ.      Family History:  History reviewed. No pertinent family history.  Past medical history, surgical history, medications, allergies, family history and social history reviewed with patient today and changes made to appropriate areas of the chart.   Review of Systems  Constitutional: Negative.   HENT: Positive for congestion, ear discharge, ear pain and hearing loss. Negative for nosebleeds, sinus pain, sore throat and tinnitus.   Eyes: Negative.   Respiratory: Negative.  Negative for stridor.   Cardiovascular: Negative.   Gastrointestinal: Positive for constipation. Negative for abdominal pain, blood in stool, diarrhea, heartburn, melena, nausea and vomiting.  Genitourinary: Negative.   Musculoskeletal: Negative.   Skin: Negative.   Neurological: Positive for  dizziness and headaches. Negative for tingling, tremors, sensory change, speech change, focal weakness, seizures, loss of consciousness and weakness.  Endo/Heme/Allergies: Negative.   Psychiatric/Behavioral: Negative.     All other ROS negative except what is listed above and in the HPI.      Objective:    BP 116/75 (BP Location: Left Arm, Patient Position: Sitting, Cuff Size: Normal)   Pulse 81   Temp 98.2 F (36.8 C) (Oral)   Ht 6' 0.44" (1.84 m)   Wt 170 lb 6.4 oz (77.3 kg)   SpO2 98%   BMI 22.83 kg/m   Wt Readings from Last 3 Encounters:  06/03/20 170 lb 6.4 oz (77.3 kg)  05/16/20 168 lb 12.8 oz (76.6 kg)  05/08/20 169 lb (76.7 kg)    Physical Exam Vitals and nursing note reviewed.  Constitutional:      General: He is not in acute distress.    Appearance: Normal appearance. He is normal weight. He is not ill-appearing, toxic-appearing or diaphoretic.  HENT:     Head: Normocephalic and atraumatic.     Comments: VERY hard of hearing    Ears:     Comments: Abnormal EAC and TM due to hx of sinus cancer    Nose: Nose normal. No congestion or rhinorrhea.     Mouth/Throat:     Mouth: Mucous membranes are moist.      Pharynx: Oropharynx is clear. No oropharyngeal exudate or posterior oropharyngeal erythema.  Eyes:     General: No scleral icterus.       Right eye: No discharge.        Left eye: No discharge.     Extraocular Movements: Extraocular movements intact.     Conjunctiva/sclera: Conjunctivae normal.     Pupils: Pupils are equal, round, and reactive to light.  Neck:     Vascular: No carotid bruit.  Cardiovascular:     Rate and Rhythm: Normal rate and regular rhythm.     Pulses: Normal pulses.     Heart sounds: No murmur heard.  No friction rub. No gallop.   Pulmonary:     Effort: Pulmonary effort is normal. No respiratory distress.     Breath sounds: Normal breath sounds. No stridor. No wheezing, rhonchi or rales.  Chest:     Chest wall: No tenderness.  Abdominal:     General: Abdomen is flat. Bowel sounds are normal. There is no distension.     Palpations: Abdomen is soft. There is no mass.     Tenderness: There is no abdominal tenderness. There is no right CVA tenderness, left CVA tenderness, guarding or rebound.     Hernia: No hernia is present.  Genitourinary:    Comments: Genital exam deferred with shared decision making Musculoskeletal:        General: No swelling, tenderness, deformity or signs of injury. Normal range of motion.     Cervical back: Normal range of motion and neck supple. No rigidity. No muscular tenderness.     Right lower leg: No edema.     Left lower leg: No edema.  Lymphadenopathy:     Cervical: No cervical adenopathy.  Skin:    General: Skin is warm and dry.     Capillary Refill: Capillary refill takes less than 2 seconds.     Coloration: Skin is not jaundiced or pale.     Findings: No bruising, erythema, lesion or rash.  Neurological:     General: No focal deficit present.     Mental  Status: He is alert and oriented to person, place, and time.     Cranial Nerves: No cranial nerve deficit.     Sensory: No sensory deficit.     Motor: No weakness.      Coordination: Coordination normal.     Gait: Gait normal.     Deep Tendon Reflexes: Reflexes normal.  Psychiatric:        Mood and Affect: Mood normal.        Behavior: Behavior normal.        Thought Content: Thought content normal.        Judgment: Judgment normal.      6CIT Screen 06/03/2020 05/22/2018 12/20/2016  What Year? 4 points 4 points 0 points  What month? 0 points 0 points 0 points  What time? 3 points 0 points 0 points  Count back from 20 0 points 0 points 0 points  Months in reverse 2 points 2 points 2 points  Repeat phrase 2 points 2 points 4 points  Total Score 11 8 6      Results for orders placed or performed during the hospital encounter of 03/20/20  SARS Coronavirus 2 by RT PCR (hospital order, performed in [place] hospital lab) Nasopharyngeal Nasopharyngeal Swab   Specimen: Nasopharyngeal Swab  Result Value Ref Range   SARS Coronavirus 2 NEGATIVE NEGATIVE  CBC with Differential  Result Value Ref Range   WBC 9.5 4.0 - 10.5 K/uL   RBC 4.11 (L) 4.22 - 5.81 MIL/uL   Hemoglobin 11.8 (L) 13.0 - 17.0 g/dL   HCT 35.4 (L) 39 - 52 %   MCV 86.1 80.0 - 100.0 fL   MCH 28.7 26.0 - 34.0 pg   MCHC 33.3 30.0 - 36.0 g/dL   RDW 13.2 11.5 - 15.5 %   Platelets 280 150 - 400 K/uL   nRBC 0.0 0.0 - 0.2 %   Neutrophils Relative % 89 %   Neutro Abs 8.4 (H) 1.7 - 7.7 K/uL   Lymphocytes Relative 9 %   Lymphs Abs 0.8 0.7 - 4.0 K/uL   Monocytes Relative 2 %   Monocytes Absolute 0.2 0 - 1 K/uL   Eosinophils Relative 0 %   Eosinophils Absolute 0.0 0 - 0 K/uL   Basophils Relative 0 %   Basophils Absolute 0.0 0 - 0 K/uL   Immature Granulocytes 0 %   Abs Immature Granulocytes 0.03 0.00 - 0.07 K/uL  Comprehensive metabolic panel  Result Value Ref Range   Sodium 140 135 - 145 mmol/L   Potassium 4.1 3.5 - 5.1 mmol/L   Chloride 102 98 - 111 mmol/L   CO2 24 22 - 32 mmol/L   Glucose, Bld 211 (H) 70 - 99 mg/dL   BUN 20 8 - 23 mg/dL   Creatinine, Ser 1.14 0.61 - 1.24 mg/dL    Calcium 9.5 8.9 - 10.3 mg/dL   Total Protein 7.9 6.5 - 8.1 g/dL   Albumin 4.8 3.5 - 5.0 g/dL   AST 37 15 - 41 U/L   ALT 31 0 - 44 U/L   Alkaline Phosphatase 53 38 - 126 U/L   Total Bilirubin 0.7 0.3 - 1.2 mg/dL   GFR calc non Af Amer >60 >60 mL/min   GFR calc Af Amer >60 >60 mL/min   Anion gap 14 5 - 15  Troponin I (High Sensitivity)  Result Value Ref Range   Troponin I (High Sensitivity) 4 <18 ng/L      Assessment & Plan:   Problem  List Items Addressed This Visit      Cardiovascular and Mediastinum   Hypertension    Under good control on current regimen. Continue current regimen. Continue to monitor. Call with any concerns. Refills given. Labs drawn today.       Relevant Medications   amLODipine (NORVASC) 2.5 MG tablet   rosuvastatin (CRESTOR) 20 MG tablet   Other Relevant Orders   CBC with Differential/Platelet   Comprehensive metabolic panel   TSH   Urinalysis, Routine w reflex microscopic   Microalbumin, Urine Waived     Endocrine   Thyroid disorder    Under good control on current regimen. Continue current regimen. Continue to monitor. Call with any concerns. Labs drawn today. Treat as needed.       Relevant Orders   CBC with Differential/Platelet   Comprehensive metabolic panel     Other   Hyperlipidemia    Under good control on current regimen. Continue current regimen. Continue to monitor. Call with any concerns. Refills given. Labs drawn today.       Relevant Medications   amLODipine (NORVASC) 2.5 MG tablet   rosuvastatin (CRESTOR) 20 MG tablet   Other Relevant Orders   CBC with Differential/Platelet   Comprehensive metabolic panel   Lipid Panel w/o Chol/HDL Ratio    Other Visit Diagnoses    Encounter for Medicare annual wellness exam    -  Primary   Preventative care discussed today as below.    Routine general medical examination at a health care facility       Vaccines up to date/declined. Screening labs checked today. Colonoscopy up to date.  Continue diet and exercise. Call with any concerns. Continue to monitor.    Screening for prostate cancer       Labs drawn today. Await results.    Relevant Orders   PSA   Need for influenza vaccination       Flu shot given today.   Relevant Orders   Flu Vaccine QUAD High Dose(Fluad) (Completed)       Preventative Services:  Health Risk Assessment and Personalized Prevention Plan: Done today Bone Mass Measurements: N/A CVD Screening: Done today Colon Cancer Screening: Up to date Depression Screening: Done today Diabetes Screening: Done today Glaucoma Screening: See your eye doctor Hepatitis B vaccine: N/A Hepatitis C screening: Up to date HIV Screening: Up to date Flu Vaccine: Done today Lung cancer Screening: N/A Obesity Screening: Done today Pneumonia Vaccines (2): Next due in March STI Screening: N/A PSA screening: Done today  Discussed aspirin prophylaxis for myocardial infarction prevention and decision was made to continue ASA  LABORATORY TESTING:  Health maintenance labs ordered today as discussed above.   The natural history of prostate cancer and ongoing controversy regarding screening and potential treatment outcomes of prostate cancer has been discussed with the patient. The meaning of a false positive PSA and a false negative PSA has been discussed. He indicates understanding of the limitations of this screening test and wishes  to proceed with screening PSA testing.   IMMUNIZATIONS:   - Tdap: Tetanus vaccination status reviewed: last tetanus booster within 10 years. - Influenza: Administered today - Pneumovax: Not applicable - Prevnar: Up to date  SCREENING: - Colonoscopy: Up to date  Discussed with patient purpose of the colonoscopy is to detect colon cancer at curable precancerous or early stages   PATIENT COUNSELING:    Sexuality: Discussed sexually transmitted diseases, partner selection, use of condoms, avoidance of unintended pregnancy  and  contraceptive  alternatives.   Advised to avoid cigarette smoking.  I discussed with the patient that most people either abstain from alcohol or drink within safe limits (<=14/week and <=4 drinks/occasion for males, <=7/weeks and <= 3 drinks/occasion for females) and that the risk for alcohol disorders and other health effects rises proportionally with the number of drinks per week and how often a drinker exceeds daily limits.  Discussed cessation/primary prevention of drug use and availability of treatment for abuse.   Diet: Encouraged to adjust caloric intake to maintain  or achieve ideal body weight, to reduce intake of dietary saturated fat and total fat, to limit sodium intake by avoiding high sodium foods and not adding table salt, and to maintain adequate dietary potassium and calcium preferably from fresh fruits, vegetables, and low-fat dairy products.    stressed the importance of regular exercise  Injury prevention: Discussed safety belts, safety helmets, smoke detector, smoking near bedding or upholstery.   Dental health: Discussed importance of regular tooth brushing, flossing, and dental visits.   Follow up plan: NEXT PREVENTATIVE PHYSICAL DUE IN 1 YEAR. Return in about 6 months (around 12/01/2020).

## 2020-06-03 NOTE — Assessment & Plan Note (Signed)
Under good control on current regimen. Continue current regimen. Continue to monitor. Call with any concerns. Refills given. Labs drawn today.   

## 2020-06-03 NOTE — Assessment & Plan Note (Signed)
Under good control on current regimen. Continue current regimen. Continue to monitor. Call with any concerns. Labs drawn today. Treat as needed.

## 2020-06-04 ENCOUNTER — Encounter: Payer: Self-pay | Admitting: Family Medicine

## 2020-06-04 LAB — COMPREHENSIVE METABOLIC PANEL
ALT: 17 IU/L (ref 0–44)
AST: 19 IU/L (ref 0–40)
Albumin/Globulin Ratio: 2.1 (ref 1.2–2.2)
Albumin: 4.7 g/dL (ref 3.8–4.8)
Alkaline Phosphatase: 66 IU/L (ref 48–121)
BUN/Creatinine Ratio: 9 — ABNORMAL LOW (ref 10–24)
BUN: 8 mg/dL (ref 8–27)
Bilirubin Total: 0.4 mg/dL (ref 0.0–1.2)
CO2: 28 mmol/L (ref 20–29)
Calcium: 9.6 mg/dL (ref 8.6–10.2)
Chloride: 101 mmol/L (ref 96–106)
Creatinine, Ser: 0.93 mg/dL (ref 0.76–1.27)
GFR calc Af Amer: 99 mL/min/{1.73_m2} (ref >59)
GFR calc non Af Amer: 86 mL/min/{1.73_m2} (ref >59)
Globulin, Total: 2.2 g/dL (ref 1.5–4.5)
Glucose: 70 mg/dL (ref 65–99)
Potassium: 4.3 mmol/L (ref 3.5–5.2)
Sodium: 141 mmol/L (ref 134–144)
Total Protein: 6.9 g/dL (ref 6.0–8.5)

## 2020-06-04 LAB — TSH: TSH: 0.906 u[IU]/mL (ref 0.450–4.500)

## 2020-06-04 LAB — LIPID PANEL W/O CHOL/HDL RATIO
Cholesterol, Total: 138 mg/dL (ref 100–199)
HDL: 42 mg/dL (ref >39)
LDL Chol Calc (NIH): 70 mg/dL (ref 0–99)
Triglycerides: 150 mg/dL — ABNORMAL HIGH (ref 0–149)
VLDL Cholesterol Cal: 26 mg/dL (ref 5–40)

## 2020-06-04 LAB — CBC WITH DIFFERENTIAL/PLATELET
Basophils Absolute: 0 10*3/uL (ref 0.0–0.2)
Basos: 1 %
EOS (ABSOLUTE): 0.2 10*3/uL (ref 0.0–0.4)
Eos: 4 %
Hematocrit: 36.6 % — ABNORMAL LOW (ref 37.5–51.0)
Hemoglobin: 12.1 g/dL — ABNORMAL LOW (ref 13.0–17.7)
Immature Grans (Abs): 0 10*3/uL (ref 0.0–0.1)
Immature Granulocytes: 0 %
Lymphocytes Absolute: 2.1 10*3/uL (ref 0.7–3.1)
Lymphs: 37 %
MCH: 28.8 pg (ref 26.6–33.0)
MCHC: 33.1 g/dL (ref 31.5–35.7)
MCV: 87 fL (ref 79–97)
Monocytes Absolute: 0.5 10*3/uL (ref 0.1–0.9)
Monocytes: 10 %
Neutrophils Absolute: 2.8 10*3/uL (ref 1.4–7.0)
Neutrophils: 48 %
Platelets: 227 10*3/uL (ref 150–450)
RBC: 4.2 x10E6/uL (ref 4.14–5.80)
RDW: 13.1 % (ref 11.6–15.4)
WBC: 5.7 10*3/uL (ref 3.4–10.8)

## 2020-06-04 LAB — PSA: Prostate Specific Ag, Serum: 0.3 ng/mL (ref 0.0–4.0)

## 2020-07-25 ENCOUNTER — Encounter: Payer: Self-pay | Admitting: Emergency Medicine

## 2020-07-25 ENCOUNTER — Other Ambulatory Visit: Payer: Self-pay

## 2020-07-25 ENCOUNTER — Emergency Department
Admission: EM | Admit: 2020-07-25 | Discharge: 2020-07-25 | Disposition: A | Discharge status: Home / Self Care | Payer: Medicare Other | Attending: Emergency Medicine | Admitting: Emergency Medicine

## 2020-07-25 ENCOUNTER — Emergency Department: Payer: Medicare Other

## 2020-07-25 DIAGNOSIS — E039 Hypothyroidism, unspecified: Secondary | ICD-10-CM | POA: Insufficient documentation

## 2020-07-25 DIAGNOSIS — Z79899 Other long term (current) drug therapy: Secondary | ICD-10-CM | POA: Diagnosis not present

## 2020-07-25 DIAGNOSIS — S0990XA Unspecified injury of head, initial encounter: Secondary | ICD-10-CM

## 2020-07-25 DIAGNOSIS — R220 Localized swelling, mass and lump, head: Secondary | ICD-10-CM | POA: Diagnosis not present

## 2020-07-25 DIAGNOSIS — J392 Other diseases of pharynx: Secondary | ICD-10-CM | POA: Diagnosis not present

## 2020-07-25 DIAGNOSIS — I10 Essential (primary) hypertension: Secondary | ICD-10-CM | POA: Insufficient documentation

## 2020-07-25 DIAGNOSIS — W0110XA Fall on same level from slipping, tripping and stumbling with subsequent striking against unspecified object, initial encounter: Secondary | ICD-10-CM | POA: Insufficient documentation

## 2020-07-25 DIAGNOSIS — W19XXXA Unspecified fall, initial encounter: Secondary | ICD-10-CM

## 2020-07-25 DIAGNOSIS — H748X1 Other specified disorders of right middle ear and mastoid: Secondary | ICD-10-CM | POA: Diagnosis not present

## 2020-07-25 DIAGNOSIS — Z87891 Personal history of nicotine dependence: Secondary | ICD-10-CM | POA: Insufficient documentation

## 2020-07-25 NOTE — Discharge Instructions (Signed)
Marcus Wright, as we discussed you have an abnormal and concerning mass in the back of your nose that is worrisome. It is critical that you follow up with your ENT at [facility] as Dr. Wynetta Emery has requested. This mass needs to be evaluated further given your history!

## 2020-07-25 NOTE — ED Triage Notes (Signed)
Pt to ED via POV, pt states that he got up during the night to use the bathroom and got a sharp pain in the back of his head that went down his back, pt states that he ended up falling, unsure if pt hit his head. Pt states that he has stents in his brain. Pt is in NAD.

## 2020-07-25 NOTE — ED Provider Notes (Signed)
[facility] Emergency Department Provider Note   ____________________________________________    I have reviewed the triage vital signs and the nursing notes.   HISTORY  Chief Complaint Fall and Back Pain   Patient extremely hard of hearing  HPI Marcus Wright is a 64 y.o. male with a history of "sinus cancer "who notes he has shunts in his brain presents after a fall.  Patient reports last night he had a brief episode of pain in his head and lost his balance and fell backwards striking the back of his head.  He denies headache today.  No new neuro deficits.  No fevers chills or myalgias.  No other injuries reported except pain to the back of his head.  No neck pain.  Past Medical History:  Diagnosis Date  . Cancer [facility]) 2003   skin'  . Hypertension   . Thyroid disorder   . Wears dentures    full upper  . Wears hearing aid     Patient Active Problem List   Diagnosis Date Noted  . Recurrent otitis externa, right 03/11/2020  . DJD (degenerative joint disease), thoracic 11/22/2018  . Mastoiditis of right side 06/22/2017  . Hearing loss 05/10/2017  . Chronic cluster headache 02/08/2017  . Arthritis 02/08/2017  . Chronic mucoid otitis media of both ears 08/26/2016  . Colonic constipation   . Hyperlipidemia 05/21/2016  . History of sinus cancer   . Hypertension   . Thyroid disorder   . Allergic rhinitis due to pollen 07/04/2014  . Cervical radicular pain 02/07/2014  . History of neck surgery 02/07/2014  . Gynecomastia, male 06/20/2013    Past Surgical History:  Procedure Laterality Date  . BACK SURGERY  2011  . BREAST SURGERY Right 11-22-12  . CHOLECYSTECTOMY    . COLONOSCOPY    . COLONOSCOPY WITH PROPOFOL N/A 07/19/2016   Procedure: COLONOSCOPY WITH PROPOFOL;  Surgeon: Lucilla Lame, MD;  Location: [place];  Service: Endoscopy;  Laterality: N/A;  . REPLACEMENT TOTAL KNEE Right 1993  . SINUS SURGERY WITH INSTATRAK  2003    cancer    Prior to Admission medications   Medication Sig Start Date End Date Taking? Authorizing Provider  amLODipine (NORVASC) 2.5 MG tablet Take 1 tablet (2.5 mg total) by mouth daily. 06/03/20   Johnson, Megan P, DO  levothyroxine (SYNTHROID) 50 MCG tablet TAKE 1 TABLET BY MOUTH EVERY DAY 02/20/20   Johnson, Megan P, DO  meloxicam (MOBIC) 15 MG tablet TAKE 1 TABLET BY MOUTH EVERY DAY 05/21/20   Park Liter P, DO  ofloxacin (OCUFLOX) 0.3 % ophthalmic solution  02/13/20   [provider]  rosuvastatin (CRESTOR) 20 MG tablet Take 1 tablet (20 mg total) by mouth daily. 06/03/20   Park Liter P, DO     Allergies Doxazosin  No family history on file.  Social History Social History   Tobacco Use  . Smoking status: Former Research scientist (life sciences)  . Smokeless tobacco: Never Used  . Tobacco comment: quit 20+ yrs ago  Vaping Use  . Vaping Use: Never used  Substance Use Topics  . Alcohol use: Not Currently    Comment: on occasion, 1-2 beers or wine occ.   . Drug use: No    Review of Systems  Constitutional: No fever/chills Eyes: No visual changes.  ENT: No nasal pain Cardiovascular: Denies chest pain. Respiratory: Denies shortness of breath. Gastrointestinal: No abdominal pain.   Genitourinary: Negative for dysuria. Musculoskeletal: Negative for back pain. Skin: Negative  for rash. Neurological: As above, no focal deficits   ____________________________________________   PHYSICAL EXAM:  VITAL SIGNS: ED Triage Vitals  Enc Vitals Group     BP 07/25/20 0846 93/69     Pulse Rate 07/25/20 0846 79     Resp 07/25/20 0846 16     Temp 07/25/20 0846 98.2 F (36.8 C)     Temp Source 07/25/20 0846 Oral     SpO2 07/25/20 0846 99 %     Weight 07/25/20 0847 77 kg (169 lb 12.1 oz)     Height 07/25/20 0847 1.829 m (6')     Head Circumference --      Peak Flow --      Pain Score 07/25/20 0847 10     Pain Loc --      Pain Edu? --      Excl. in [place]? --     Constitutional: Alert and  oriented. Eyes: Conjunctivae are normal.  Head: Atraumatic. Nose: No congestion/rhinnorhea.  Cardiovascular: Normal rate, regular rhythm. Gr Good peripheral circulation. Respiratory: Normal respiratory effort.  No retractions.  Gastrointestinal: Soft and nontender. No distention.    Musculoskeletal:  Warm and well perfused Neurologic:  Normal speech and language. No gross focal neurologic deficits are appreciated.  Skin:  Skin is warm, dry and intact. No rash noted. Psychiatric: Mood and affect are normal. Speech and behavior are normal.  ____________________________________________   LABS (all labs ordered are listed, but only abnormal results are displayed)  Labs Reviewed - No data to display ____________________________________________  EKG  None ____________________________________________  RADIOLOGY  CT head reviewed by me, no acute injury noted, confirmed by radiology.  Radiology Dr. Pascal Lux contacted [place] to notify us of worrisome right nasopharyngeal mass, discussed this with patient and his granddaughter that he will need close follow-up with ENT ____________________________________________   PROCEDURES  Procedure(s) performed: No  Procedures   Critical Care performed: No ____________________________________________   INITIAL IMPRESSION / ASSESSMENT AND PLAN / ED COURSE  Pertinent labs & imaging results that were available during my care of the patient were reviewed by me and considered in my medical decision making (see chart for details).  Patient presents after a fall last night, well-appearing today, reassuring exam, CT head without acute injury.  Noted nasopharyngeal mass, in the setting of history of sinus cancer.  Discussed with patient and granddaughter the need to follow-up closely with ENT.  Discussed with his PCP Dr. Wynetta Emery as well  No indication for admission at this time, close ENT follow-up, return precautions discussed      ____________________________________________   FINAL CLINICAL IMPRESSION(S) / ED DIAGNOSES  Final diagnoses:  Fall, initial encounter  Injury of head, initial encounter  Nasopharyngeal mass        Note:  This document was prepared using Dragon voice recognition software and may include unintentional dictation errors.   Lavonia Drafts, MD 07/25/20 1544

## 2020-07-25 NOTE — ED Notes (Signed)
Spoke with Dr. Kerman Passey about pt. Per Dr. Kerman Passey, order CT head without contrast.

## 2020-07-28 DIAGNOSIS — J343 Hypertrophy of nasal turbinates: Secondary | ICD-10-CM | POA: Diagnosis not present

## 2020-07-28 DIAGNOSIS — J342 Deviated nasal septum: Secondary | ICD-10-CM | POA: Diagnosis not present

## 2020-07-28 DIAGNOSIS — J392 Other diseases of pharynx: Secondary | ICD-10-CM | POA: Diagnosis not present

## 2020-07-28 DIAGNOSIS — H6091 Unspecified otitis externa, right ear: Secondary | ICD-10-CM | POA: Diagnosis not present

## 2020-07-28 DIAGNOSIS — H748X1 Other specified disorders of right middle ear and mastoid: Secondary | ICD-10-CM | POA: Diagnosis not present

## 2020-08-01 ENCOUNTER — Ambulatory Visit (INDEPENDENT_AMBULATORY_CARE_PROVIDER_SITE_OTHER): Payer: Medicare Other | Admitting: Family Medicine

## 2020-08-01 ENCOUNTER — Other Ambulatory Visit: Payer: Self-pay

## 2020-08-01 ENCOUNTER — Encounter: Payer: Self-pay | Admitting: Family Medicine

## 2020-08-01 VITALS — Ht 72.0 in | Wt 169.0 lb

## 2020-08-01 VITALS — BP 101/68 | HR 68 | Temp 98.0°F | Ht 72.0 in | Wt 169.0 lb

## 2020-08-01 DIAGNOSIS — Z538 Procedure and treatment not carried out for other reasons: Secondary | ICD-10-CM

## 2020-08-01 DIAGNOSIS — H9201 Otalgia, right ear: Secondary | ICD-10-CM

## 2020-08-01 MED ORDER — PREDNISONE 50 MG PO TABS: 50.0000 mg | ORAL_TABLET | Freq: Every day | ORAL | 0 refills | Status: DC

## 2020-08-01 NOTE — Progress Notes (Signed)
BP 101/68   Pulse 68   Temp 98 F (36.7 C) (Oral)   Ht 6' (1.829 m)   Wt 169 lb (76.7 kg)   SpO2 100%   BMI 22.92 kg/m    Subjective:    Patient ID: Marcus Wright, male    DOB: December 04, 1955, 64 y.o.   MRN: [id number]  HPI: Marcus Wright is a 64 y.o. male  Chief Complaint  Patient presents with  . Ear Pain    Right ear pain - granddaughter said this started yesterday   EAR PAIN- had a big clump of stuff come down through his ET and into his throat then spit it out Duration: 3-4 days Involved ear(s): right Severity:  moderate  Quality:  sharp Fever: no Otorrhea: no Upper respiratory infection symptoms: no Pruritus: no Hearing loss: yes Water immersion no Using Q-tips: no Recurrent otitis media: yes Status: stable Treatments attempted: sweet oil  Relevant past medical, surgical, family and social history reviewed and updated as indicated. Interim medical history since our last visit reviewed. Allergies and medications reviewed and updated.  Review of Systems  Constitutional: Negative.   HENT: Positive for ear pain, hearing loss and voice change. Negative for congestion, dental problem, drooling, ear discharge, facial swelling, mouth sores, nosebleeds, postnasal drip, rhinorrhea, sinus pressure, sinus pain, sneezing, sore throat, tinnitus and trouble swallowing.   Respiratory: Negative.   Cardiovascular: Negative.   Gastrointestinal: Negative.   Musculoskeletal: Negative.   Neurological: Negative.   Psychiatric/Behavioral: Negative.     Per HPI unless specifically indicated above     Objective:    BP 101/68   Pulse 68   Temp 98 F (36.7 C) (Oral)   Ht 6' (1.829 m)   Wt 169 lb (76.7 kg)   SpO2 100%   BMI 22.92 kg/m   Wt Readings from Last 3 Encounters:  08/01/20 169 lb (76.7 kg)  08/01/20 169 lb (76.7 kg)  07/25/20 169 lb 12.1 oz (77 kg)    Physical Exam Vitals and nursing note reviewed.  Constitutional:      General: He is not in acute  distress.    Appearance: Normal appearance. He is not ill-appearing, toxic-appearing or diaphoretic.  HENT:     Head: Normocephalic and atraumatic.     Right Ear: External ear normal.     Left Ear: External ear normal.     Ears:     Comments: Deformity of R EAC, mild fluid in the canal, no pus or swelling    Nose: Nose normal.     Mouth/Throat:     Mouth: Mucous membranes are moist.     Pharynx: Oropharynx is clear.  Eyes:     General: No scleral icterus.       Right eye: No discharge.        Left eye: No discharge.     Extraocular Movements: Extraocular movements intact.     Conjunctiva/sclera: Conjunctivae normal.     Pupils: Pupils are equal, round, and reactive to light.  Cardiovascular:     Rate and Rhythm: Normal rate and regular rhythm.     Pulses: Normal pulses.     Heart sounds: Normal heart sounds. No murmur heard.  No friction rub. No gallop.   Pulmonary:     Effort: Pulmonary effort is normal. No respiratory distress.     Breath sounds: Normal breath sounds. No stridor. No wheezing, rhonchi or rales.  Chest:     Chest wall: No tenderness.  Musculoskeletal:  General: Normal range of motion.     Cervical back: Normal range of motion and neck supple.  Skin:    General: Skin is warm and dry.     Capillary Refill: Capillary refill takes less than 2 seconds.     Coloration: Skin is not jaundiced or pale.     Findings: No bruising, erythema, lesion or rash.  Neurological:     General: No focal deficit present.     Mental Status: He is alert and oriented to person, place, and time. Mental status is at baseline.  Psychiatric:        Mood and Affect: Mood normal.        Behavior: Behavior normal.        Thought Content: Thought content normal.        Judgment: Judgment normal.     Results for orders placed or performed in visit on 06/03/20  CBC with Differential/Platelet  Result Value Ref Range   WBC 5.7 3.4 - 10.8 x10E3/uL   RBC 4.20 4.14 - 5.80 x10E6/uL     Hemoglobin 12.1 (L) 13.0 - 17.7 g/dL   Hematocrit 36.6 (L) 37.5 - 51.0 %   MCV 87 79 - 97 fL   MCH 28.8 26.6 - 33.0 pg   MCHC 33.1 31 - 35 g/dL   RDW 13.1 11.6 - 15.4 %   Platelets 227 150 - 450 x10E3/uL   Neutrophils 48 Not Estab. %   Lymphs 37 Not Estab. %   Monocytes 10 Not Estab. %   Eos 4 Not Estab. %   Basos 1 Not Estab. %   Neutrophils Absolute 2.8 1.40 - 7.00 x10E3/uL   Lymphocytes Absolute 2.1 0 - 3 x10E3/uL   Monocytes Absolute 0.5 0 - 0 x10E3/uL   EOS (ABSOLUTE) 0.2 0.0 - 0.4 x10E3/uL   Basophils Absolute 0.0 0 - 0 x10E3/uL   Immature Granulocytes 0 Not Estab. %   Immature Grans (Abs) 0.0 0.0 - 0.1 x10E3/uL  Comprehensive metabolic panel  Result Value Ref Range   Glucose 70 65 - 99 mg/dL   BUN 8 8 - 27 mg/dL   Creatinine, Ser 0.93 0.76 - 1.27 mg/dL   GFR calc non Af Amer 86 >59 mL/min/1.73   GFR calc Af Amer 99 >59 mL/min/1.73   BUN/Creatinine Ratio 9 (L) 10 - 24   Sodium 141 134 - 144 mmol/L   Potassium 4.3 3.5 - 5.2 mmol/L   Chloride 101 96 - 106 mmol/L   CO2 28 20 - 29 mmol/L   Calcium 9.6 8.6 - 10.2 mg/dL   Total Protein 6.9 6.0 - 8.5 g/dL   Albumin 4.7 3.8 - 4.8 g/dL   Globulin, Total 2.2 1.5 - 4.5 g/dL   Albumin/Globulin Ratio 2.1 1.2 - 2.2   Bilirubin Total 0.4 0.0 - 1.2 mg/dL   Alkaline Phosphatase 66 48 - 121 IU/L   AST 19 0 - 40 IU/L   ALT 17 0 - 44 IU/L  Lipid Panel w/o Chol/HDL Ratio  Result Value Ref Range   Cholesterol, Total 138 100 - 199 mg/dL   Triglycerides 150 (H) 0 - 149 mg/dL   HDL 42 >39 mg/dL   VLDL Cholesterol Cal 26 5 - 40 mg/dL   LDL Chol Calc (NIH) 70 0 - 99 mg/dL  PSA  Result Value Ref Range   Prostate Specific Ag, Serum 0.3 0.0 - 4.0 ng/mL  TSH  Result Value Ref Range   TSH 0.906 0.450 - 4.500 uIU/mL  Urinalysis,  Routine w reflex microscopic  Result Value Ref Range   Specific Gravity, UA 1.010 1.005 - 1.030   pH, UA 6.0 5.0 - 7.5   Color, UA Yellow Yellow   Appearance Ur Clear Clear   Leukocytes,UA Negative Negative    Protein,UA Negative Negative/Trace   Glucose, UA Negative Negative   Ketones, UA Negative Negative   RBC, UA Negative Negative   Bilirubin, UA Negative Negative   Urobilinogen, Ur 0.2 0.2 - 1.0 mg/dL   Nitrite, UA Negative Negative  Microalbumin, Urine Waived  Result Value Ref Range   Microalb, Ur Waived 10 0 - 19 mg/L   Creatinine, Urine Waived 50 10 - 300 mg/dL   Microalb/Creat Ratio <30 <30 mg/g      Assessment & Plan:   Problem List Items Addressed This Visit    None    Visit Diagnoses    Right ear pain    -  Primary   Will treat with prednisone, ear looks pretty good today. Recheck 1 week. Call with any concerns.        Follow up plan: Return in about 1 week (around 08/08/2020).

## 2020-08-04 ENCOUNTER — Other Ambulatory Visit: Payer: Self-pay | Admitting: Otolaryngology

## 2020-08-04 DIAGNOSIS — Z8522 Personal history of malignant neoplasm of nasal cavities, middle ear, and accessory sinuses: Secondary | ICD-10-CM

## 2020-08-04 DIAGNOSIS — J392 Other diseases of pharynx: Secondary | ICD-10-CM

## 2020-08-08 ENCOUNTER — Ambulatory Visit (INDEPENDENT_AMBULATORY_CARE_PROVIDER_SITE_OTHER): Payer: Medicare Other | Admitting: Family Medicine

## 2020-08-08 ENCOUNTER — Other Ambulatory Visit: Payer: Self-pay

## 2020-08-08 ENCOUNTER — Encounter: Payer: Self-pay | Admitting: Family Medicine

## 2020-08-08 VITALS — BP 93/55 | HR 71 | Temp 98.1°F | Ht 72.0 in | Wt 170.0 lb

## 2020-08-08 DIAGNOSIS — H9201 Otalgia, right ear: Secondary | ICD-10-CM

## 2020-08-08 NOTE — Progress Notes (Signed)
BP (!) 93/55   Pulse 71   Temp 98.1 F (36.7 C) (Oral)   Ht 6' (1.829 m)   Wt 170 lb (77.1 kg)   SpO2 97%   BMI 23.06 kg/m    Subjective:    Patient ID: Marcus Wright, male    DOB: 1956-05-20, 64 y.o.   MRN: [id number]  HPI: Marcus Wright is a 64 y.o. male  Chief Complaint  Patient presents with  . Ear Pain    follow uop   Doing well. He feels like the prednisone really helped. He still has a little pain when a nerve gets irritated, but it's significantly better than it was. He notes that he sees ENT again next week and he really likes them. No other concerns or complaints at this time.   Relevant past medical, surgical, family and social history reviewed and updated as indicated. Interim medical history since our last visit reviewed. Allergies and medications reviewed and updated.  Review of Systems  Constitutional: Negative.   HENT: Positive for ear discharge and hearing loss. Negative for congestion, dental problem, drooling, ear pain, facial swelling, mouth sores, nosebleeds, postnasal drip, rhinorrhea, sinus pressure, sinus pain, sneezing, sore throat, tinnitus, trouble swallowing and voice change.   Respiratory: Negative.   Cardiovascular: Negative.   Gastrointestinal: Negative.   Psychiatric/Behavioral: Negative.     Per HPI unless specifically indicated above     Objective:    BP (!) 93/55   Pulse 71   Temp 98.1 F (36.7 C) (Oral)   Ht 6' (1.829 m)   Wt 170 lb (77.1 kg)   SpO2 97%   BMI 23.06 kg/m   Wt Readings from Last 3 Encounters:  08/08/20 170 lb (77.1 kg)  08/01/20 169 lb (76.7 kg)  08/01/20 169 lb (76.7 kg)    Physical Exam Vitals and nursing note reviewed.  Constitutional:      General: He is not in acute distress.    Appearance: Normal appearance. He is not ill-appearing, toxic-appearing or diaphoretic.  HENT:     Head: Normocephalic and atraumatic.     Right Ear: External ear normal.     Left Ear: External ear normal.      Ears:     Comments: Mild drainage in R EAC, deformity to EAC    Nose: Nose normal.     Mouth/Throat:     Mouth: Mucous membranes are moist.     Pharynx: Oropharynx is clear.  Eyes:     General: No scleral icterus.       Right eye: No discharge.        Left eye: No discharge.     Extraocular Movements: Extraocular movements intact.     Conjunctiva/sclera: Conjunctivae normal.     Pupils: Pupils are equal, round, and reactive to light.  Cardiovascular:     Rate and Rhythm: Normal rate and regular rhythm.     Pulses: Normal pulses.     Heart sounds: Normal heart sounds. No murmur heard.  No friction rub. No gallop.   Pulmonary:     Effort: Pulmonary effort is normal. No respiratory distress.     Breath sounds: Normal breath sounds. No stridor. No wheezing, rhonchi or rales.  Chest:     Chest wall: No tenderness.  Musculoskeletal:        General: Normal range of motion.     Cervical back: Normal range of motion and neck supple.  Skin:    General: Skin is warm and  dry.     Capillary Refill: Capillary refill takes less than 2 seconds.     Coloration: Skin is not jaundiced or pale.     Findings: No bruising, erythema, lesion or rash.  Neurological:     General: No focal deficit present.     Mental Status: He is alert and oriented to person, place, and time. Mental status is at baseline.  Psychiatric:        Mood and Affect: Mood normal.        Behavior: Behavior normal.        Thought Content: Thought content normal.        Judgment: Judgment normal.     Results for orders placed or performed in visit on 06/03/20  CBC with Differential/Platelet  Result Value Ref Range   WBC 5.7 3.4 - 10.8 x10E3/uL   RBC 4.20 4.14 - 5.80 x10E6/uL   Hemoglobin 12.1 (L) 13.0 - 17.7 g/dL   Hematocrit 36.6 (L) 37.5 - 51.0 %   MCV 87 79 - 97 fL   MCH 28.8 26.6 - 33.0 pg   MCHC 33.1 31 - 35 g/dL   RDW 13.1 11.6 - 15.4 %   Platelets 227 150 - 450 x10E3/uL   Neutrophils 48 Not Estab. %   Lymphs  37 Not Estab. %   Monocytes 10 Not Estab. %   Eos 4 Not Estab. %   Basos 1 Not Estab. %   Neutrophils Absolute 2.8 1.40 - 7.00 x10E3/uL   Lymphocytes Absolute 2.1 0 - 3 x10E3/uL   Monocytes Absolute 0.5 0 - 0 x10E3/uL   EOS (ABSOLUTE) 0.2 0.0 - 0.4 x10E3/uL   Basophils Absolute 0.0 0 - 0 x10E3/uL   Immature Granulocytes 0 Not Estab. %   Immature Grans (Abs) 0.0 0.0 - 0.1 x10E3/uL  Comprehensive metabolic panel  Result Value Ref Range   Glucose 70 65 - 99 mg/dL   BUN 8 8 - 27 mg/dL   Creatinine, Ser 0.93 0.76 - 1.27 mg/dL   GFR calc non Af Amer 86 >59 mL/min/1.73   GFR calc Af Amer 99 >59 mL/min/1.73   BUN/Creatinine Ratio 9 (L) 10 - 24   Sodium 141 134 - 144 mmol/L   Potassium 4.3 3.5 - 5.2 mmol/L   Chloride 101 96 - 106 mmol/L   CO2 28 20 - 29 mmol/L   Calcium 9.6 8.6 - 10.2 mg/dL   Total Protein 6.9 6.0 - 8.5 g/dL   Albumin 4.7 3.8 - 4.8 g/dL   Globulin, Total 2.2 1.5 - 4.5 g/dL   Albumin/Globulin Ratio 2.1 1.2 - 2.2   Bilirubin Total 0.4 0.0 - 1.2 mg/dL   Alkaline Phosphatase 66 48 - 121 IU/L   AST 19 0 - 40 IU/L   ALT 17 0 - 44 IU/L  Lipid Panel w/o Chol/HDL Ratio  Result Value Ref Range   Cholesterol, Total 138 100 - 199 mg/dL   Triglycerides 150 (H) 0 - 149 mg/dL   HDL 42 >39 mg/dL   VLDL Cholesterol Cal 26 5 - 40 mg/dL   LDL Chol Calc (NIH) 70 0 - 99 mg/dL  PSA  Result Value Ref Range   Prostate Specific Ag, Serum 0.3 0.0 - 4.0 ng/mL  TSH  Result Value Ref Range   TSH 0.906 0.450 - 4.500 uIU/mL  Urinalysis, Routine w reflex microscopic  Result Value Ref Range   Specific Gravity, UA 1.010 1.005 - 1.030   pH, UA 6.0 5.0 - 7.5   Color,  UA Yellow Yellow   Appearance Ur Clear Clear   Leukocytes,UA Negative Negative   Protein,UA Negative Negative/Trace   Glucose, UA Negative Negative   Ketones, UA Negative Negative   RBC, UA Negative Negative   Bilirubin, UA Negative Negative   Urobilinogen, Ur 0.2 0.2 - 1.0 mg/dL   Nitrite, UA Negative Negative    Microalbumin, Urine Waived  Result Value Ref Range   Microalb, Ur Waived 10 0 - 19 mg/L   Creatinine, Urine Waived 50 10 - 300 mg/dL   Microalb/Creat Ratio <30 <30 mg/g      Assessment & Plan:   Problem List Items Addressed This Visit    None    Visit Diagnoses    Right ear pain    -  Primary   Resolved with prednisone. Continue to monitor. Seeing ENT next week. Call with any concerns.        Follow up plan: Return March, follow up.

## 2020-08-18 ENCOUNTER — Other Ambulatory Visit: Payer: Self-pay

## 2020-08-18 ENCOUNTER — Ambulatory Visit
Admission: RE | Admit: 2020-08-18 | Discharge: 2020-08-18 | Disposition: A | Discharge status: Home / Self Care | Payer: Medicare Other | Source: Ambulatory Visit | Attending: Otolaryngology | Admitting: Otolaryngology

## 2020-08-18 DIAGNOSIS — Z8522 Personal history of malignant neoplasm of nasal cavities, middle ear, and accessory sinuses: Secondary | ICD-10-CM

## 2020-08-18 DIAGNOSIS — J392 Other diseases of pharynx: Secondary | ICD-10-CM

## 2020-08-18 DIAGNOSIS — H748X1 Other specified disorders of right middle ear and mastoid: Secondary | ICD-10-CM | POA: Diagnosis not present

## 2020-08-18 DIAGNOSIS — C119 Malignant neoplasm of nasopharynx, unspecified: Secondary | ICD-10-CM | POA: Diagnosis not present

## 2020-08-18 DIAGNOSIS — M2548 Effusion, other site: Secondary | ICD-10-CM | POA: Diagnosis not present

## 2020-08-18 MED ORDER — IOPAMIDOL (ISOVUE-300) INJECTION 61%
75.0000 mL | Freq: Once | INTRAVENOUS | Status: AC | PRN
  Administered 2020-08-18: 75 mL via INTRAVENOUS

## 2020-08-26 DIAGNOSIS — H906 Mixed conductive and sensorineural hearing loss, bilateral: Secondary | ICD-10-CM | POA: Diagnosis not present

## 2020-08-27 ENCOUNTER — Other Ambulatory Visit: Payer: Self-pay

## 2020-08-27 ENCOUNTER — Encounter (HOSPITAL_BASED_OUTPATIENT_CLINIC_OR_DEPARTMENT_OTHER): Payer: Self-pay | Admitting: Otolaryngology

## 2020-08-27 ENCOUNTER — Other Ambulatory Visit: Payer: Self-pay | Admitting: Otolaryngology

## 2020-08-28 ENCOUNTER — Other Ambulatory Visit (HOSPITAL_COMMUNITY)
Admission: RE | Admit: 2020-08-28 | Discharge: 2020-08-28 | Disposition: A | Discharge status: Home / Self Care | Payer: Medicare Other | Source: Ambulatory Visit | Attending: Otolaryngology | Admitting: Otolaryngology

## 2020-08-28 DIAGNOSIS — Z01812 Encounter for preprocedural laboratory examination: Secondary | ICD-10-CM | POA: Diagnosis not present

## 2020-08-28 DIAGNOSIS — Z20822 Contact with and (suspected) exposure to covid-19: Secondary | ICD-10-CM | POA: Diagnosis not present

## 2020-08-28 LAB — SARS CORONAVIRUS 2 (TAT 6-24 HRS): SARS Coronavirus 2: NEGATIVE

## 2020-08-31 ENCOUNTER — Inpatient Hospital Stay (HOSPITAL_COMMUNITY)
Admission: AD | Admit: 2020-08-31 | Payer: Medicare Other | Source: Other Acute Inpatient Hospital | Admitting: Internal Medicine

## 2020-08-31 ENCOUNTER — Emergency Department: Payer: Medicare Other

## 2020-08-31 ENCOUNTER — Inpatient Hospital Stay
Admission: EM | Admit: 2020-08-31 | Discharge: 2020-09-01 | DRG: 392 | Disposition: A | Discharge status: Other Acute Inpatient Hospital | Payer: Medicare Other | Attending: Internal Medicine | Admitting: Internal Medicine

## 2020-08-31 ENCOUNTER — Encounter: Payer: Self-pay | Admitting: Emergency Medicine

## 2020-08-31 ENCOUNTER — Other Ambulatory Visit: Payer: Self-pay

## 2020-08-31 DIAGNOSIS — Z791 Long term (current) use of non-steroidal anti-inflammatories (NSAID): Secondary | ICD-10-CM

## 2020-08-31 DIAGNOSIS — J340 Abscess, furuncle and carbuncle of nose: Secondary | ICD-10-CM | POA: Diagnosis present

## 2020-08-31 DIAGNOSIS — Z87891 Personal history of nicotine dependence: Secondary | ICD-10-CM

## 2020-08-31 DIAGNOSIS — I10 Essential (primary) hypertension: Secondary | ICD-10-CM | POA: Diagnosis not present

## 2020-08-31 DIAGNOSIS — E039 Hypothyroidism, unspecified: Secondary | ICD-10-CM | POA: Diagnosis present

## 2020-08-31 DIAGNOSIS — J3489 Other specified disorders of nose and nasal sinuses: Secondary | ICD-10-CM | POA: Diagnosis not present

## 2020-08-31 DIAGNOSIS — Z9221 Personal history of antineoplastic chemotherapy: Secondary | ICD-10-CM

## 2020-08-31 DIAGNOSIS — Z79890 Hormone replacement therapy (postmenopausal): Secondary | ICD-10-CM | POA: Diagnosis not present

## 2020-08-31 DIAGNOSIS — G44029 Chronic cluster headache, not intractable: Secondary | ICD-10-CM | POA: Diagnosis present

## 2020-08-31 DIAGNOSIS — Z79899 Other long term (current) drug therapy: Secondary | ICD-10-CM | POA: Diagnosis not present

## 2020-08-31 DIAGNOSIS — H6530 Chronic mucoid otitis media, unspecified ear: Secondary | ICD-10-CM | POA: Diagnosis not present

## 2020-08-31 DIAGNOSIS — Z96651 Presence of right artificial knee joint: Secondary | ICD-10-CM | POA: Diagnosis present

## 2020-08-31 DIAGNOSIS — Z20822 Contact with and (suspected) exposure to covid-19: Secondary | ICD-10-CM | POA: Diagnosis present

## 2020-08-31 DIAGNOSIS — H9211 Otorrhea, right ear: Secondary | ICD-10-CM | POA: Diagnosis present

## 2020-08-31 DIAGNOSIS — R1310 Dysphagia, unspecified: Principal | ICD-10-CM | POA: Diagnosis present

## 2020-08-31 DIAGNOSIS — Z972 Presence of dental prosthetic device (complete) (partial): Secondary | ICD-10-CM

## 2020-08-31 DIAGNOSIS — J029 Acute pharyngitis, unspecified: Secondary | ICD-10-CM | POA: Diagnosis not present

## 2020-08-31 DIAGNOSIS — Z888 Allergy status to other drugs, medicaments and biological substances status: Secondary | ICD-10-CM

## 2020-08-31 DIAGNOSIS — T17908A Unspecified foreign body in respiratory tract, part unspecified causing other injury, initial encounter: Secondary | ICD-10-CM | POA: Diagnosis present

## 2020-08-31 DIAGNOSIS — R0602 Shortness of breath: Secondary | ICD-10-CM | POA: Diagnosis not present

## 2020-08-31 DIAGNOSIS — Z923 Personal history of irradiation: Secondary | ICD-10-CM

## 2020-08-31 DIAGNOSIS — E876 Hypokalemia: Secondary | ICD-10-CM | POA: Diagnosis not present

## 2020-08-31 DIAGNOSIS — M1990 Unspecified osteoarthritis, unspecified site: Secondary | ICD-10-CM | POA: Diagnosis not present

## 2020-08-31 DIAGNOSIS — D649 Anemia, unspecified: Secondary | ICD-10-CM | POA: Diagnosis not present

## 2020-08-31 DIAGNOSIS — Z974 Presence of external hearing-aid: Secondary | ICD-10-CM

## 2020-08-31 DIAGNOSIS — D492 Neoplasm of unspecified behavior of bone, soft tissue, and skin: Secondary | ICD-10-CM | POA: Diagnosis not present

## 2020-08-31 DIAGNOSIS — J392 Other diseases of pharynx: Secondary | ICD-10-CM | POA: Diagnosis not present

## 2020-08-31 DIAGNOSIS — R1312 Dysphagia, oropharyngeal phase: Secondary | ICD-10-CM | POA: Diagnosis not present

## 2020-08-31 DIAGNOSIS — H906 Mixed conductive and sensorineural hearing loss, bilateral: Secondary | ICD-10-CM | POA: Diagnosis present

## 2020-08-31 DIAGNOSIS — Z8522 Personal history of malignant neoplasm of nasal cavities, middle ear, and accessory sinuses: Secondary | ICD-10-CM

## 2020-08-31 DIAGNOSIS — E785 Hyperlipidemia, unspecified: Secondary | ICD-10-CM | POA: Diagnosis present

## 2020-08-31 DIAGNOSIS — J387 Other diseases of larynx: Secondary | ICD-10-CM | POA: Diagnosis not present

## 2020-08-31 DIAGNOSIS — R221 Localized swelling, mass and lump, neck: Secondary | ICD-10-CM | POA: Diagnosis not present

## 2020-08-31 DIAGNOSIS — H9190 Unspecified hearing loss, unspecified ear: Secondary | ICD-10-CM | POA: Diagnosis not present

## 2020-08-31 LAB — CBC
HCT: 33.4 % — ABNORMAL LOW (ref 39.0–52.0)
Hemoglobin: 11.3 g/dL — ABNORMAL LOW (ref 13.0–17.0)
MCH: 28.5 pg (ref 26.0–34.0)
MCHC: 33.8 g/dL (ref 30.0–36.0)
MCV: 84.3 fL (ref 80.0–100.0)
Platelets: 353 10*3/uL (ref 150–400)
RBC: 3.96 MIL/uL — ABNORMAL LOW (ref 4.22–5.81)
RDW: 12.8 % (ref 11.5–15.5)
WBC: 7 10*3/uL (ref 4.0–10.5)
nRBC: 0 % (ref 0.0–0.2)

## 2020-08-31 LAB — BASIC METABOLIC PANEL
Anion gap: 9 (ref 5–15)
BUN: 8 mg/dL (ref 8–23)
CO2: 27 mmol/L (ref 22–32)
Calcium: 9.4 mg/dL (ref 8.9–10.3)
Chloride: 102 mmol/L (ref 98–111)
Creatinine, Ser: 1 mg/dL (ref 0.61–1.24)
GFR, Estimated: >60 mL/min (ref >60)
Glucose, Bld: 116 mg/dL — ABNORMAL HIGH (ref 70–99)
Potassium: 4.1 mmol/L (ref 3.5–5.1)
Sodium: 138 mmol/L (ref 135–145)

## 2020-08-31 LAB — RESP PANEL BY RT-PCR (FLU A&B, COVID) ARPGX2
Influenza A by PCR: NEGATIVE
Influenza B by PCR: NEGATIVE
SARS Coronavirus 2 by RT PCR: NEGATIVE

## 2020-08-31 MED ORDER — SODIUM CHLORIDE 0.9 % IV SOLN
3.0000 g | Freq: Four times a day (QID) | INTRAVENOUS | Status: DC
  Administered 2020-08-31 – 2020-09-01 (×3): 3 g via INTRAVENOUS
  Filled 2020-08-31 (×3): qty 8

## 2020-08-31 MED ORDER — IOHEXOL 300 MG/ML  SOLN
75.0000 mL | Freq: Once | INTRAMUSCULAR | Status: AC | PRN
  Administered 2020-08-31: 75 mL via INTRAVENOUS

## 2020-08-31 MED ORDER — ONDANSETRON HCL 4 MG/2ML IJ SOLN
4.0000 mg | Freq: Once | INTRAMUSCULAR | Status: AC
  Administered 2020-08-31: 4 mg via INTRAVENOUS
  Filled 2020-08-31: qty 2

## 2020-08-31 MED ORDER — LACTATED RINGERS IV BOLUS
1000.0000 mL | Freq: Once | INTRAVENOUS | Status: AC
  Administered 2020-08-31: 1000 mL via INTRAVENOUS

## 2020-08-31 MED ORDER — DEXAMETHASONE SODIUM PHOSPHATE 10 MG/ML IJ SOLN
10.0000 mg | Freq: Once | INTRAMUSCULAR | Status: AC
  Administered 2020-08-31: 10 mg via INTRAVENOUS
  Filled 2020-08-31: qty 1

## 2020-08-31 MED ORDER — SODIUM CHLORIDE 0.9 % IV SOLN
3.0000 g | Freq: Once | INTRAVENOUS | Status: AC
  Administered 2020-08-31: 3 g via INTRAVENOUS
  Filled 2020-08-31: qty 8

## 2020-08-31 NOTE — ED Notes (Signed)
Pt sleeping. Son at bedside. Explained that transfer process is underway but takes time. No further needs expressed at this time.

## 2020-08-31 NOTE — ED Notes (Signed)
ENT at bedside

## 2020-08-31 NOTE — ED Notes (Signed)
Pt back from CT. Son at bedside.

## 2020-08-31 NOTE — ED Triage Notes (Signed)
Pt niece reports pt woke up choking. Pt niece reports hx of the same. Pt had sinus cancer and has been having some issues and he is scheduled this week for a biopsy. Pt coughing and clearing throat in triage continuously. Pt also weak.

## 2020-08-31 NOTE — ED Notes (Signed)
Pt son states that EDP told pt to attempt swallowing water. Pt was able to drink about 1 mouthful of water, but pt choked and seemed to be "forcing water down".Pt resting in bed with blankets.

## 2020-08-31 NOTE — Progress Notes (Addendum)
Pharmacy Antibiotic Note  Marcus Wright is a 64 y.o. male admitted on 08/31/2020. Patient has PMH for sinus carcinoma s/p chemoradiation, bilateral severe hearing loss currently followed [facility], HTN, HDL, hypothyroidism, and arthritis. Pharmacy has been consulted for Unasyn dosing for sinus abscess.  Plan: Start Unasyn 3g IV q6h Monitor renal function and adjust dose as clinically indicated   Height: 6' (182.9 cm) Weight: 77 kg (169 lb 12.1 oz) IBW/kg (Calculated) : 77.6  Temp (24hrs), Avg:98.3 F (36.8 C), Min:98.3 F (36.8 C), Max:98.3 F (36.8 C)  Recent Labs  Lab 08/31/20 0856  WBC 7.0  CREATININE 1.00    Estimated Creatinine Clearance: 79.1 mL/min (by C-G formula based on SCr of 1 mg/dL).    Allergies  Allergen Reactions  . Doxazosin Hives    Unknown     Antimicrobials this admission: Unasyn 12/5 >>   Microbiology results: 12/5 Nasopharyngeal Cx: pending  Thank you for allowing pharmacy to be a part of this patient's care.  Sherilyn Banker, PharmD Pharmacy Resident  08/31/2020 5:00 PM

## 2020-08-31 NOTE — Consult Note (Signed)
Page, [place] [id number] Feb 27, 1956 Marcus Starch, MD  Reason for Consult: Dysphagia  HPI: 64 y.o. 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  Initially followed and treated by Dr. Johny Shock at [facility].  Longstanding history of right ear otorrhea and seen by Melany Guernsey and Honor Junes at [facility] as well as Dr. Malon Kindle at [facility].  Patient has a history of dysequilibrium and underwent CT scan on 08/18/2020 ordered by Dr. Fredric Dine at [facility].  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  This morning patient woke up with choking episode and was brought to ER.  This morning he had difficulty with breathing during that episode and called his son who is POA.  Patient denies any current breathing difficulty but reports some dysphagia and decreased tolerance of his secretions.  Scheduled for surgery at [facility] tomorrow for biopsy of this lesion.  Of note, nasal endoscopy by [facility] last month was deemed normal with no nasopharyngeal mass.  Allergies:  Allergies  Allergen Reactions  . Doxazosin Hives    Unknown     ROS: Review of systems normal other than 12 systems except per HPI.  PMH:  Past Medical History:  Diagnosis Date  . Cancer [facility]) 2003   skin'  . Hyperlipidemia   . Hypertension   . Hypothyroidism   . Thyroid disorder   . Wears dentures    full upper  . Wears hearing aid     FH: No family history on file.  SH:  Social History   Socioeconomic History  . Marital status: Widowed    Spouse name: Not on file  . Number of children: Not on file  . Years of education: Not on file  . Highest education level: Not on file  Occupational History  . Not on file  Tobacco Use  . Smoking status: Former Research scientist (life sciences)  . Smokeless tobacco: Never Used  . Tobacco comment: quit 20+ yrs ago  Vaping Use  . Vaping Use: Never used  Substance and Sexual Activity  . Alcohol use: Yes     Comment: on occasion, 1-2 beers or wine occ.   . Drug use: No  . Sexual activity: Not Currently  Other Topics Concern  . Not on file  Social History Narrative  . Not on file   Social Determinants of Health   Financial Resource Strain:   . Difficulty of Paying Living Expenses: Not on file  Food Insecurity:   . Worried About Charity fundraiser in the Last Year: Not on file  . Ran Out of Food in the Last Year: Not on file  Transportation Needs:   . Lack of Transportation (Medical): Not on file  . Lack of Transportation (Non-Medical): Not on file  Physical Activity:   . Days of Exercise per Week: Not on file  . Minutes of Exercise per Session: Not on file  Stress:   . Feeling of Stress : Not on file  Social Connections:   . Frequency of Communication with Friends and Family: Not on file  . Frequency of Social Gatherings with Friends and Family: Not on file  . Attends Religious Services: Not on file  . Active Member of Clubs or Organizations: Not on file  . Attends Archivist Meetings: Not on file  . Marital Status: Not on file  Intimate Partner Violence:   . Fear of Current or Ex-Partner:  Not on file  . Emotionally Abused: Not on file  . Physically Abused: Not on file  . Sexually Abused: Not on file    PSH:  Past Surgical History:  Procedure Laterality Date  . BACK SURGERY  2011  . BREAST SURGERY Right 11-22-12  . CHOLECYSTECTOMY    . COLONOSCOPY    . COLONOSCOPY WITH PROPOFOL N/A 07/19/2016   Procedure: COLONOSCOPY WITH PROPOFOL;  Surgeon: Lucilla Lame, MD;  Location[place];  Service: Endoscopy;  Laterality: N/A;  . REPLACEMENT TOTAL KNEE Right 1993  . SINUS SURGERY WITH INSTATRAK  2003   cancer    Physical  Exam:  GEN-  NAD, supine in bed, hard of hearing EARS-  Purulent otorrhea right ear canal, left myringosclerosis of left TM NOSE- clear anteriorly OC/OP-  Yellow pus dripping from posterior nasopharynx, no abnormal masses or lesions  NECK- thin, no obvious masses or lesions  Small level 1 lymph node RESP- unlabored with no stridor or use of accessory muscles CARD- RRR  CT-  Right skull base mass encasing carotid and abutting skull base/carotid canal.  No airway impingement  Procedure:  Trans-nasal flexible laryngoscopy.  Pre-procedure diagnosis: dysphagia.  Post-procedure diagnosis same.  After verbal consent was obtained the flexible laryngosope was inserted into the patient's right nasal cavity.  This demonstrated a surgically reduced inferior turbinate and previous right sphenoidotomy.  There was purulence emanating from sphenoid sinus and adenoid pad and edema and erythema of mucosa.  The patient's true vocal folds were mobile bilaterally but with limited abduction but no obvious weakness.  There is no pooling of secretions.  Airway widely patent.   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  Scheduled for biopsy at [facility] in morning.  No change in mass on CT scan over last month to explain symptoms.  Culture taken today and recommend IV antibiotics and steroids then if can tolerated oral intake following, then convert to oral.  If cannot tolerate oral intake consider barium swallow.  Could consider discussion with [facility] given scheduled surgery in a.m. if patient is unable to tolerate PO and needs to be admitted.     Marcus Wright 66/01/2020 1:51 PM

## 2020-08-31 NOTE — ED Notes (Signed)
Warm blankets provided. Pt has urinal at bedside and has been instructed re: urine sample.

## 2020-08-31 NOTE — ED Provider Notes (Signed)
[facility] Emergency Department Provider Note  ____________________________________________   First MD Initiated Contact with Patient 08/31/20 [phone]     (approximate)  I have reviewed the triage vital signs and the nursing notes.   HISTORY  Chief Complaint Choking   HPI Marcus Wright is a 64 y.o. male with past medical history of sinus carcinoma status post chemoradiation, bilateral severe hearing loss currently followed by ENT at [facility], HTN, HDL, hypothyroidism, and arthritis who presents accompanied by his son for assessment of shortness of breath change in voice and sensation of choking reportedly that occurred earlier this morning.  History is very limited from the patient secondary to severe hearing loss and inability to speak.  He is able to barely say his name.  Per his son he typically does not have any squeakiness or abnormal sounds when he attempts to speak and that he was woken by his father this morning when his father stated he felt he was choking.  His father typically uses a liquid diet and has not attempted to eat anything solid and has otherwise been in his usual state health.  He has a chronic cough but per son and patient this is not changed at all significantly.  Patient denies any acute pain including headache, chest pain, back pain or rash or extremity pain.  No prior synopsis.  No clear alleviating aggravating factors        Past Medical History:  Diagnosis Date  . Cancer [facility]) 2003   skin'  . Hyperlipidemia   . Hypertension   . Hypothyroidism   . Thyroid disorder   . Wears dentures    full upper  . Wears hearing aid     Patient Active Problem List   Diagnosis Date Noted  . Nasal abscess 08/31/2020  . Recurrent otitis externa, right 03/11/2020  . DJD (degenerative joint disease), thoracic 11/22/2018  . Mastoiditis of right side 06/22/2017  . Hearing loss 05/10/2017  . Chronic cluster headache 02/08/2017  .  Arthritis 02/08/2017  . Bilateral hearing loss 01/24/2017  . Ear mass, right 01/24/2017  . Chronic otitis externa of right ear 09/23/2016  . Mixed conductive and sensorineural hearing loss of both ears 09/23/2016  . Chronic mucoid otitis media of both ears 08/26/2016  . 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 Colonic constipation   . Hyperlipidemia 05/21/2016  . Other specified disorders of Eustachian tube, unspecified ear 05/20/2016  . Encounter for fitting or adjustment of hearing aid 01/07/2016  . Chronic mucoid otitis media 11/28/2015  . History of sinus cancer   . Hypertension   . Thyroid disorder   . Dysphagia, pharyngoesophageal phase 07/31/2015  . Sensorineural hearing loss, bilateral 05/20/2015  . Allergic rhinitis due to pollen 07/04/2014  . Cervical radicular pain 02/07/2014  . History of neck surgery 02/07/2014  . Gynecomastia, male 06/20/2013    Past Surgical History:  Procedure Laterality Date  . BACK SURGERY  2011  . BREAST SURGERY Right 11-22-12  . CHOLECYSTECTOMY    . COLONOSCOPY    . COLONOSCOPY WITH PROPOFOL N/A 07/19/2016   Procedure: COLONOSCOPY WITH PROPOFOL;  Surgeon: Lucilla Lame, MD;  Location: [place];  Service: Endoscopy;  Laterality: N/A;  . REPLACEMENT TOTAL KNEE Right 1993  . SINUS SURGERY WITH INSTATRAK  2003   cancer    Prior to Admission medications   Medication Sig Start Date End Date Taking? Authorizing Provider  amLODipine (NORVASC) 2.5 MG  tablet Take 1 tablet (2.5 mg total) by mouth daily. 06/03/20  Yes Johnson, Megan P, DO  levothyroxine (SYNTHROID) 50 MCG tablet TAKE 1 TABLET BY MOUTH EVERY DAY Patient taking differently: Take 50 mcg by mouth daily.  02/20/20  Yes Johnson, Megan P, DO  meloxicam (MOBIC) 15 MG tablet Take 15 mg by mouth daily.   Yes [provider]  rosuvastatin (CRESTOR) 20 MG tablet Take 1 tablet (20 mg total) by mouth daily. 06/03/20  Yes Johnson, Megan P, DO    gabapentin (NEURONTIN) 300 MG capsule Take 300 mg by mouth 2 (two) times daily. PRN    [provider]    Allergies Doxazosin  No family history on file.  Social History Social History   Tobacco Use  . Smoking status: Former Research scientist (life sciences)  . Smokeless tobacco: Never Used  . Tobacco comment: quit 20+ yrs ago  Vaping Use  . Vaping Use: Never used  Substance Use Topics  . Alcohol use: Yes    Comment: on occasion, 1-2 beers or wine occ.   . Drug use: No    Review of Systems  Review of Systems  Unable to perform ROS: Patient nonverbal  Respiratory: Positive for cough and shortness of breath.   Cardiovascular: Negative for chest pain.      ____________________________________________   PHYSICAL EXAM:  VITAL SIGNS: ED Triage Vitals [08/31/20 0849]  Enc Vitals Group     BP (!) 89/66     Pulse Rate 81     Resp 18     Temp 98.3 F (36.8 C)     Temp Source Oral     SpO2 100 %     Weight 169 lb 12.1 oz (77 kg)     Height 6' (1.829 m)     Head Circumference      Peak Flow      Pain Score 0     Pain Loc      Pain Edu?      Excl. in [place]?    Vitals:   08/31/20 1600 08/31/20 1630  BP: 115/76 117/81  Pulse: 69 74  Resp: 17 19  Temp:    SpO2: 94% 96%   Physical Exam Vitals and nursing note reviewed.  Constitutional:      Appearance: He is well-developed.  HENT:     Head: Normocephalic and atraumatic.     Right Ear: External ear normal.     Left Ear: External ear normal.     Nose: Nose normal.     Mouth/Throat:     Mouth: Mucous membranes are dry.  Eyes:     Conjunctiva/sclera: Conjunctivae normal.  Cardiovascular:     Rate and Rhythm: Normal rate and regular rhythm.     Pulses: Normal pulses.     Heart sounds: No murmur heard.   Pulmonary:     Effort: Pulmonary effort is normal. No respiratory distress.     Breath sounds: Normal breath sounds.  Abdominal:     Palpations: Abdomen is soft.     Tenderness: There is no abdominal tenderness.   Musculoskeletal:     Cervical back: Neck supple.  Skin:    General: Skin is warm and dry.     Capillary Refill: Capillary refill takes 2 to 3 seconds.  Neurological:     Mental Status: He is alert.  Psychiatric:        Mood and Affect: Mood normal.      ____________________________________________   LABS (all labs ordered are  listed, but only abnormal results are displayed)  Labs Reviewed  BASIC METABOLIC PANEL - Abnormal; Notable for the following components:      Result Value   Glucose, Bld 116 (*)    All other components within normal limits  CBC - Abnormal; Notable for the following components:   RBC 3.96 (*)    Hemoglobin 11.3 (*)    HCT 33.4 (*)    All other components within normal limits  RESP PANEL BY RT-PCR (FLU A&B, COVID) ARPGX2  AEROBIC/ANAEROBIC CULTURE (SURGICAL/DEEP WOUND)  CBC WITH DIFFERENTIAL/PLATELET  CREATININE, SERUM   ____________________________________________  EKG  Sinus rhythm with a ventricular of 85, normal axis, unremarkable intervals, no evidence of acute ischemia or significant delay arrhythmia. ____________________________________________  RADIOLOGY  ED MD interpretation: Chest x-ray is unremarkable.  Official radiology report(s): DG Chest 2 View  Result Date: 08/31/2020 CLINICAL DATA:  Shortness of breath EXAM: CHEST - 2 VIEW COMPARISON:  March 20, 2020 FINDINGS: The heart size and mediastinal contours are within normal limits. Both lungs are clear. The visualized skeletal structures are unremarkable. IMPRESSION: No active cardiopulmonary disease. Electronically Signed   By: Dorise Bullion III M.D   On: 08/31/2020 11:49   CT Soft Tissue Neck W Contrast  Result Date: 08/31/2020 CLINICAL DATA:  Stridor and short of breath. Dysphagia. Rule out epiglottitis or tonsillitis. EXAM: CT NECK WITH CONTRAST TECHNIQUE: Multidetector CT imaging of the neck was performed using the standard protocol following the bolus administration of  intravenous contrast. CONTRAST:  24mL OMNIPAQUE IOHEXOL 300 MG/ML  SOLN COMPARISON:  CT neck 08/18/2020 FINDINGS: Pharynx and larynx: Mass in the right posterior nasopharynx again noted and unchanged. Central low density necrosis present. Mass is difficult to measure due to ill-defined margins but is approximately 2.5 cm in diameter. There is extension to the skull base on the right. There is posterior extension into the soft tissues around the right carotid and jugular vein without occlusion. Normal airway. Epiglottis and larynx normal. No parapharyngeal abscess. Salivary glands: Atrophic parotid and submandibular glands without mass or edema Thyroid: Small thyroid without focal abnormality Lymph nodes: Right level 1 lymph node 11 mm in short axis dimension. Left level 1 lymph node 8.7 mm in short axis dimension. No change from the prior study. No other significant lymph nodes in the neck. Vascular: Tumor infiltration around the right internal carotid artery right jugular vein below the skull base without vascular occlusion. Atherosclerotic calcification in the carotid bifurcation bilaterally. Limited intracranial: Negative Visualized orbits: Negative Mastoids and visualized paranasal sinuses: Prior sinus surgery on the right. Mucosal edema in the sphenoid sinus bilaterally right greater than left. Right mastoid and middle ear effusion consistent with obstruction of the eustachian tube. This is unchanged from the prior study. Skeleton: Fusion.  Interbody fusion C4-5 and C5-6 with solid Nasopharyngeal tumor extends to the right skull base. No definite bone destruction. Upper chest: Lung apices clear bilaterally. Other: None IMPRESSION: Nasopharyngeal mass unchanged from recent CT neck. This is most compatible with squamous cell carcinoma. There is infiltration around the right jugular vein and right carotid artery without vascular occlusion. Right mastoid effusion and right middle ear effusion due to occlusion of  the eustachian tube. 11 mm right level 1 lymph node and 8.7 mm left level 1 lymph node. No other significant adenopathy. Electronically Signed   By: Franchot Gallo M.D.   On: 08/31/2020 11:34    ____________________________________________   PROCEDURES  Procedure(s) performed (including Critical Care):  .1-3 Lead EKG Interpretation Performed  by: Lucrezia Starch, MD Authorized by: Lucrezia Starch, MD     Interpretation: normal     ECG rate assessment: normal     Rhythm: sinus rhythm     Ectopy: none     Conduction: normal       ____________________________________________   INITIAL IMPRESSION / ASSESSMENT AND PLAN / ED COURSE        Patient presents above-stated exam for assessment of change in voice as well as sensation of choking and shortness of breath.  On arrival patient is afebrile hemodynamically stable with SPO2 of 100% on room air.  He does have a very squeaky voice is very difficult to understand but no inspiratory stridor.  His oropharynx looks very dry but no obvious lesions or abnormalities noted.  He does have full range of motion of his neck and his lungs are clear bilaterally.  Differential goes but is not limited to epiglottitis, vocal cord damage from tumor, nerve injury from tumor, acute infectious process, recurrence of cancer from remote radiation therapy.  Chest x-ray showed no evidence of acute fracture dislocation pneumonia or volume overload. CT Neck w/o evidence of epiglotittis or other clear acute obstruction but known nasopharyngeal mass and infiltration aroudn the R  Carotid artery.   Dr. Pryor Ochoa ENT consulted for further evaluation given concern for possible upper airway secretion and patient unable to tolerate secretions while in the ED (continuously spitting into a cup) as well as some difficulty swallowing (coughing and gagging while attempting to swallow some water).   Bedside scope performed by Dr. Pryor Ochoa remarkable for "There was purulence  emanating from sphenoid sinus and adenoid pad and edema and erythema of mucosa.  The patient's true vocal folds were mobile bilaterally but with limited abduction but no obvious weakness.  There is no pooling of secretions.  Airway widely patent."  BMP and CBC unremarkable. Per Dr Pryor Ochoa recommendation, patient given 1x dose of Unasyn and 10mg  decadron. I did attempt to reach his ENT physician Dr. Fredric Dine but was unable to reach her or anyone in her practice. At time of sign-out, I am awaiting a callback from  On-call physician at this practice although nobody has returned my repeated calls x3 over 65min.   Care of patient signed over to oncoming provider at 1500. Plan is to admit to medicine if no call-back from [facility].           ____________________________________________   FINAL CLINICAL IMPRESSION(S) / ED DIAGNOSES  Final diagnoses:  Dysphagia, unspecified type    Medications  Ampicillin-Sulbactam (UNASYN) 3 g in sodium chloride 0.9 % 100 mL IVPB (has no administration in time range)  lactated ringers bolus 1,000 mL (0 mLs Intravenous Stopped 08/31/20 1244)  iohexol (OMNIPAQUE) 300 MG/ML solution 75 mL (75 mLs Intravenous Contrast Given 08/31/20 1100)  ondansetron (ZOFRAN) injection 4 mg (4 mg Intravenous Given 08/31/20 1338)  Ampicillin-Sulbactam (UNASYN) 3 g in sodium chloride 0.9 % 100 mL IVPB (0 g Intravenous Stopped 08/31/20 1559)  dexamethasone (DECADRON) injection 10 mg (10 mg Intravenous Given 08/31/20 1438)     ED Discharge Orders    None       Note:  This document was prepared using Dragon voice recognition software and may include unintentional dictation errors.   Lucrezia Starch, MD 08/31/20 [phone]

## 2020-08-31 NOTE — ED Notes (Signed)
[employer] spoke to [place] for transfer 1408

## 2020-08-31 NOTE — ED Notes (Signed)
Son states that pt choked this morning and that his voice sounded "squeaky". Pt does cough normally but has been coughing more. Pt had received radiation to neck area and since then has had dysphagia. Pt had CT scan of neck and head and mass was found somewhere between neck and brain, on R side. Pt 100% SPO2. Son at bedside.

## 2020-08-31 NOTE — ED Notes (Signed)
Pt felt like he "couldn't breathe" and son came to get this nurse. Pt sounds the same as before, somewhat gasping but is moving air and coughing up phlegm. SPO2 is 100% on RA. EDP brought to bedside who evaluated pt.

## 2020-09-01 ENCOUNTER — Encounter (HOSPITAL_COMMUNITY): Payer: Self-pay | Admitting: Anesthesiology

## 2020-09-01 ENCOUNTER — Inpatient Hospital Stay (HOSPITAL_BASED_OUTPATIENT_CLINIC_OR_DEPARTMENT_OTHER)
Admission: RE | Admit: 2020-09-01 | Discharge: 2020-09-05 | DRG: 156 | Disposition: A | Discharge status: Home / Self Care | Payer: Medicare Other | Attending: Internal Medicine | Admitting: Internal Medicine

## 2020-09-01 ENCOUNTER — Encounter (HOSPITAL_COMMUNITY)
Admission: RE | Disposition: A | Discharge status: Home / Self Care | Payer: Self-pay | Source: Home / Self Care | Attending: Internal Medicine

## 2020-09-01 DIAGNOSIS — Z923 Personal history of irradiation: Secondary | ICD-10-CM | POA: Diagnosis not present

## 2020-09-01 DIAGNOSIS — H7011 Chronic mastoiditis, right ear: Secondary | ICD-10-CM | POA: Diagnosis not present

## 2020-09-01 DIAGNOSIS — Z972 Presence of dental prosthetic device (complete) (partial): Secondary | ICD-10-CM

## 2020-09-01 DIAGNOSIS — R1310 Dysphagia, unspecified: Secondary | ICD-10-CM | POA: Diagnosis not present

## 2020-09-01 DIAGNOSIS — Z79899 Other long term (current) drug therapy: Secondary | ICD-10-CM | POA: Diagnosis not present

## 2020-09-01 DIAGNOSIS — Z8522 Personal history of malignant neoplasm of nasal cavities, middle ear, and accessory sinuses: Secondary | ICD-10-CM

## 2020-09-01 DIAGNOSIS — Z79890 Hormone replacement therapy (postmenopausal): Secondary | ICD-10-CM | POA: Diagnosis not present

## 2020-09-01 DIAGNOSIS — Z87891 Personal history of nicotine dependence: Secondary | ICD-10-CM

## 2020-09-01 DIAGNOSIS — J323 Chronic sphenoidal sinusitis: Secondary | ICD-10-CM | POA: Diagnosis not present

## 2020-09-01 DIAGNOSIS — H9190 Unspecified hearing loss, unspecified ear: Secondary | ICD-10-CM | POA: Diagnosis present

## 2020-09-01 DIAGNOSIS — Z888 Allergy status to other drugs, medicaments and biological substances status: Secondary | ICD-10-CM

## 2020-09-01 DIAGNOSIS — Z20822 Contact with and (suspected) exposure to covid-19: Secondary | ICD-10-CM | POA: Diagnosis not present

## 2020-09-01 DIAGNOSIS — I10 Essential (primary) hypertension: Secondary | ICD-10-CM | POA: Diagnosis present

## 2020-09-01 DIAGNOSIS — Z9889 Other specified postprocedural states: Secondary | ICD-10-CM

## 2020-09-01 DIAGNOSIS — H6530 Chronic mucoid otitis media, unspecified ear: Secondary | ICD-10-CM | POA: Diagnosis not present

## 2020-09-01 DIAGNOSIS — E785 Hyperlipidemia, unspecified: Secondary | ICD-10-CM | POA: Diagnosis present

## 2020-09-01 DIAGNOSIS — Z974 Presence of external hearing-aid: Secondary | ICD-10-CM | POA: Diagnosis not present

## 2020-09-01 DIAGNOSIS — H906 Mixed conductive and sensorineural hearing loss, bilateral: Secondary | ICD-10-CM | POA: Diagnosis not present

## 2020-09-01 DIAGNOSIS — E876 Hypokalemia: Secondary | ICD-10-CM | POA: Diagnosis not present

## 2020-09-01 DIAGNOSIS — R221 Localized swelling, mass and lump, neck: Secondary | ICD-10-CM | POA: Diagnosis not present

## 2020-09-01 DIAGNOSIS — E039 Hypothyroidism, unspecified: Secondary | ICD-10-CM | POA: Diagnosis not present

## 2020-09-01 DIAGNOSIS — Z9221 Personal history of antineoplastic chemotherapy: Secondary | ICD-10-CM

## 2020-09-01 DIAGNOSIS — Z791 Long term (current) use of non-steroidal anti-inflammatories (NSAID): Secondary | ICD-10-CM

## 2020-09-01 DIAGNOSIS — R1312 Dysphagia, oropharyngeal phase: Secondary | ICD-10-CM | POA: Diagnosis not present

## 2020-09-01 DIAGNOSIS — E079 Disorder of thyroid, unspecified: Secondary | ICD-10-CM | POA: Diagnosis present

## 2020-09-01 DIAGNOSIS — J392 Other diseases of pharynx: Secondary | ICD-10-CM | POA: Diagnosis not present

## 2020-09-01 DIAGNOSIS — J3489 Other specified disorders of nose and nasal sinuses: Secondary | ICD-10-CM | POA: Diagnosis not present

## 2020-09-01 DIAGNOSIS — Z96651 Presence of right artificial knee joint: Secondary | ICD-10-CM | POA: Diagnosis not present

## 2020-09-01 DIAGNOSIS — D649 Anemia, unspecified: Secondary | ICD-10-CM | POA: Diagnosis not present

## 2020-09-01 DIAGNOSIS — J00 Acute nasopharyngitis [common cold]: Secondary | ICD-10-CM | POA: Diagnosis not present

## 2020-09-01 HISTORY — DX: Hyperlipidemia, unspecified: E78.5

## 2020-09-01 HISTORY — DX: Hypothyroidism, unspecified: E03.9

## 2020-09-01 LAB — CBC WITH DIFFERENTIAL/PLATELET
Abs Immature Granulocytes: 0.01 10*3/uL (ref 0.00–0.07)
Basophils Absolute: 0 10*3/uL (ref 0.0–0.1)
Basophils Relative: 0 %
Eosinophils Absolute: 0 10*3/uL (ref 0.0–0.5)
Eosinophils Relative: 0 %
HCT: 33.3 % — ABNORMAL LOW (ref 39.0–52.0)
Hemoglobin: 11.2 g/dL — ABNORMAL LOW (ref 13.0–17.0)
Immature Granulocytes: 0 %
Lymphocytes Relative: 17 %
Lymphs Abs: 0.9 10*3/uL (ref 0.7–4.0)
MCH: 28.3 pg (ref 26.0–34.0)
MCHC: 33.6 g/dL (ref 30.0–36.0)
MCV: 84.1 fL (ref 80.0–100.0)
Monocytes Absolute: 0.1 10*3/uL (ref 0.1–1.0)
Monocytes Relative: 1 %
Neutro Abs: 4.5 10*3/uL (ref 1.7–7.7)
Neutrophils Relative %: 82 %
Platelets: 324 10*3/uL (ref 150–400)
RBC: 3.96 MIL/uL — ABNORMAL LOW (ref 4.22–5.81)
RDW: 12.9 % (ref 11.5–15.5)
WBC: 5.5 10*3/uL (ref 4.0–10.5)
nRBC: 0 % (ref 0.0–0.2)

## 2020-09-01 LAB — CREATININE, SERUM
Creatinine, Ser: 0.93 mg/dL (ref 0.61–1.24)
GFR, Estimated: >60 mL/min (ref >60)

## 2020-09-01 SURGERY — ENDOSCOPY, NOSE
Anesthesia: General | Laterality: Right

## 2020-09-01 MED ORDER — SODIUM CHLORIDE 0.9 % IV SOLN: Freq: Once | INTRAVENOUS | Status: AC

## 2020-09-01 MED ORDER — SODIUM CHLORIDE 0.9% FLUSH
3.0000 mL | Freq: Two times a day (BID) | INTRAVENOUS | Status: DC
  Administered 2020-09-02 – 2020-09-05 (×8): 3 mL via INTRAVENOUS

## 2020-09-01 MED ORDER — SODIUM CHLORIDE 0.9 % IV SOLN
3.0000 g | Freq: Four times a day (QID) | INTRAVENOUS | Status: DC
  Administered 2020-09-02 – 2020-09-04 (×11): 3 g via INTRAVENOUS
  Filled 2020-09-01: qty 3
  Filled 2020-09-01 (×2): qty 8
  Filled 2020-09-01 (×2): qty 3
  Filled 2020-09-01: qty 8
  Filled 2020-09-01 (×7): qty 3
  Filled 2020-09-01: qty 8

## 2020-09-01 NOTE — ED Notes (Signed)
Pt gives verbal consent for transfer 

## 2020-09-01 NOTE — Anesthesia Preprocedure Evaluation (Deleted)
Anesthesia Evaluation    Reviewed: Allergy & Precautions, Patient's Chart, lab work & pertinent test results  History of Anesthesia Complications Negative for: history of anesthetic complications  Airway        Dental   Pulmonary neg pulmonary ROS, former smoker,           Cardiovascular hypertension,      Neuro/Psych negative neurological ROS  negative psych ROS   GI/Hepatic negative GI ROS, Neg liver ROS,   Endo/Other  Hypothyroidism   Renal/GU negative Renal ROS  negative genitourinary   Musculoskeletal negative musculoskeletal ROS (+)   Abdominal   Peds  Hematology negative hematology ROS (+)   Anesthesia Other Findings  Nasopharyngeal mass  Reproductive/Obstetrics                            Anesthesia Physical Anesthesia Plan  ASA: II  Anesthesia Plan: General   Post-op Pain Management:    Induction: Intravenous  PONV Risk Score and Plan: 2 and Ondansetron, Dexamethasone, Treatment may vary due to age or medical condition and Midazolam  Airway Management Planned: Oral ETT  Additional Equipment: None  Intra-op Plan:   Post-operative Plan: Extubation in OR  Informed Consent:   Plan Discussed with:   Anesthesia Plan Comments:         Anesthesia Quick Evaluation

## 2020-09-01 NOTE — H&P (Signed)
History and Physical   Marcus Wright FYB:[id number] DOB: 15-Dec-1955 DOA: 09/01/2020  PCP: Valerie Roys, DO   Patient coming from: Home, then transferred from [facility]  Chief Complaint: Dysphagia  HPI: Marcus Wright is a 64 y.o. male with medical history significant of history of sinus cancer in 2003, chronic mucoid otitis media, degenerative disc disease, dysphagia, ear mass, hearing loss, history of neck surgery, hypertension, hyperlipidemia, hypothyroidism who presents with dysphagia.  As above patient has a history of sinus cancer status post radiation therapy and chemo in 2003.  He has a long history of multiple ENT related issues since that time.  An evaluation in November patient had a CT scan done on the 22nd which showed this nasopharyngeal mass.  On the morning of 12/5 patient woke up in had a choking sensation.  And during his choking sensations he had some difficulty breathing.  After he got to the ED at [facility] his breathing had improved but he continued to have dysphagia and he was made n.p.o.  Multiple attempts have been made to get him transfer but he was unable to get here until this evening.  He states that his swelling has improved with the treatments done at [facility] ED he does still sound hoarse though.  He denies fever, chest pain.  ED Course: Per chart review, his vitals at [facility] ED were stable but did have some soft blood pressures.  Lab work-up showed normal BMP and CBC with stable hemoglobin around 11.  His respiratory panel for flu and Covid was negative.  Patient received empiric Unasyn and was given a dose of Decadron.  Review of Systems: As per HPI otherwise all other systems reviewed and are negative.  Past Medical History:  Diagnosis Date  . Cancer [facility]) 2003   skin'  . Hyperlipidemia   . Hypertension   . Hypothyroidism   . Thyroid disorder   . Wears dentures    full upper  . Wears hearing aid     Past Surgical History:  Procedure Laterality Date  . BACK  SURGERY  2011  . BREAST SURGERY Right 11-22-12  . CHOLECYSTECTOMY    . COLONOSCOPY    . COLONOSCOPY WITH PROPOFOL N/A 07/19/2016   Procedure: COLONOSCOPY WITH PROPOFOL;  Surgeon: Lucilla Lame, MD;  Location: [place];  Service: Endoscopy;  Laterality: N/A;  . REPLACEMENT TOTAL KNEE Right 1993  . SINUS SURGERY WITH INSTATRAK  2003   cancer    Social History  reports that he has quit smoking. He has never used smokeless tobacco. He reports current alcohol use. He reports that he does not use drugs.  Allergies  Allergen Reactions  . Doxazosin Hives    Unknown     History reviewed. No pertinent family history.  Reviewed on admission  Prior to Admission medications   Medication Sig Start Date End Date Taking? Authorizing Provider  amLODipine (NORVASC) 2.5 MG tablet Take 1 tablet (2.5 mg total) by mouth daily. 06/03/20  Yes Johnson, Megan P, DO  levothyroxine (SYNTHROID) 50 MCG tablet TAKE 1 TABLET BY MOUTH EVERY DAY Patient taking differently: Take 50 mcg by mouth daily.  02/20/20  Yes Johnson, Megan P, DO  rosuvastatin (CRESTOR) 20 MG tablet Take 1 tablet (20 mg total) by mouth daily. 06/03/20  Yes Johnson, Megan P, DO  meloxicam (MOBIC) 15 MG tablet Take 15 mg by mouth daily.    [provider]    Physical Exam: Vitals:   08/27/20 1302  Weight: 77.1  kg  Height: 6' (1.829 m)   Physical Exam Constitutional:      General: He is not in acute distress.    Appearance: Normal appearance.     Comments: Thin male with hoarse speech  HENT:     Head: Normocephalic and atraumatic.     Mouth/Throat:     Mouth: Mucous membranes are moist.     Pharynx: Oropharynx is clear.  Eyes:     Extraocular Movements: Extraocular movements intact.     Pupils: Pupils are equal, round, and reactive to light.  Cardiovascular:     Rate and Rhythm: Normal rate and regular rhythm.     Pulses: Normal pulses.     Heart sounds: Normal heart sounds.  Pulmonary:     Effort: Pulmonary  effort is normal. No respiratory distress.     Breath sounds: Normal breath sounds.  Abdominal:     General: Bowel sounds are normal. There is no distension.     Palpations: Abdomen is soft.     Tenderness: There is no abdominal tenderness.  Musculoskeletal:        General: No swelling or deformity.  Skin:    General: Skin is warm and dry.  Neurological:     General: No focal deficit present.     Mental Status: Mental status is at baseline.    Labs on Admission: I have personally reviewed following labs and imaging studies  CBC: Recent Labs  Lab 08/31/20 0856 09/01/20 0506  WBC 7.0 5.5  NEUTROABS  --  4.5  HGB 11.3* 11.2*  HCT 33.4* 33.3*  MCV 84.3 84.1  PLT 353 301    Basic Metabolic Panel: Recent Labs  Lab 08/31/20 0856 09/01/20 0506  NA 138  --   K 4.1  --   CL 102  --   CO2 27  --   GLUCOSE 116*  --   BUN 8  --   CREATININE 1.00 0.93  CALCIUM 9.4  --     GFR: Estimated Creatinine Clearance: 85.2 mL/min (by C-G formula based on SCr of 0.93 mg/dL).  Liver Function Tests: No results for input(s): AST, ALT, ALKPHOS, BILITOT, PROT, ALBUMIN in the last 168 hours.  Urine analysis:    Component Value Date/Time   COLORURINE YELLOW (A) 02/25/2020 2013   APPEARANCEUR Clear 06/03/2020 0901   LABSPEC 1.011 02/25/2020 2013   LABSPEC 1.012 12/10/2011 1307   PHURINE 9.0 (H) 02/25/2020 2013   GLUCOSEU Negative 06/03/2020 0901   GLUCOSEU Negative 12/10/2011 1307   HGBUR NEGATIVE 02/25/2020 2013   BILIRUBINUR Negative 06/03/2020 0901   BILIRUBINUR Negative 12/10/2011 [place] 02/25/2020 2013   PROTEINUR Negative 06/03/2020 0901   PROTEINUR NEGATIVE 02/25/2020 2013   NITRITE Negative 06/03/2020 0901   NITRITE NEGATIVE 02/25/2020 2013   LEUKOCYTESUR Negative 06/03/2020 0901   LEUKOCYTESUR NEGATIVE 02/25/2020 2013   LEUKOCYTESUR Negative 12/10/2011 1307    Radiological Exams on Admission: DG Chest 2 View  Result Date: 08/31/2020 CLINICAL  DATA:  Shortness of breath EXAM: CHEST - 2 VIEW COMPARISON:  March 20, 2020 FINDINGS: The heart size and mediastinal contours are within normal limits. Both lungs are clear. The visualized skeletal structures are unremarkable. IMPRESSION: No active cardiopulmonary disease. Electronically Signed   By: Dorise Bullion III M.D   On: 08/31/2020 11:49   CT Soft Tissue Neck W Contrast  Result Date: 08/31/2020 CLINICAL DATA:  Stridor and short of breath. Dysphagia. Rule out epiglottitis or tonsillitis. EXAM: CT NECK WITH CONTRAST  TECHNIQUE: Multidetector CT imaging of the neck was performed using the standard protocol following the bolus administration of intravenous contrast. CONTRAST:  10mL OMNIPAQUE IOHEXOL 300 MG/ML  SOLN COMPARISON:  CT neck 08/18/2020 FINDINGS: Pharynx and larynx: Mass in the right posterior nasopharynx again noted and unchanged. Central low density necrosis present. Mass is difficult to measure due to ill-defined margins but is approximately 2.5 cm in diameter. There is extension to the skull base on the right. There is posterior extension into the soft tissues around the right carotid and jugular vein without occlusion. Normal airway. Epiglottis and larynx normal. No parapharyngeal abscess. Salivary glands: Atrophic parotid and submandibular glands without mass or edema Thyroid: Small thyroid without focal abnormality Lymph nodes: Right level 1 lymph node 11 mm in short axis dimension. Left level 1 lymph node 8.7 mm in short axis dimension. No change from the prior study. No other significant lymph nodes in the neck. Vascular: Tumor infiltration around the right internal carotid artery right jugular vein below the skull base without vascular occlusion. Atherosclerotic calcification in the carotid bifurcation bilaterally. Limited intracranial: Negative Visualized orbits: Negative Mastoids and visualized paranasal sinuses: Prior sinus surgery on the right. Mucosal edema in the sphenoid sinus  bilaterally right greater than left. Right mastoid and middle ear effusion consistent with obstruction of the eustachian tube. This is unchanged from the prior study. Skeleton: Fusion.  Interbody fusion C4-5 and C5-6 with solid Nasopharyngeal tumor extends to the right skull base. No definite bone destruction. Upper chest: Lung apices clear bilaterally. Other: None IMPRESSION: Nasopharyngeal mass unchanged from recent CT neck. This is most compatible with squamous cell carcinoma. There is infiltration around the right jugular vein and right carotid artery without vascular occlusion. Right mastoid effusion and right middle ear effusion due to occlusion of the eustachian tube. 11 mm right level 1 lymph node and 8.7 mm left level 1 lymph node. No other significant adenopathy. Electronically Signed   By: Franchot Gallo M.D.   On: 08/31/2020 11:34    EKG: EKG from 12/5 unable to be reviewed due to technical difficulties with EMR  Assessment/Plan Principal Problem:   Dysphagia Active Problems:   Hypertension   Thyroid disorder   History of neck surgery   Neck mass  Dysphagia Nasopharyngeal mass > Redemonstrated on CT, stable, infiltration around the right jugular vein and right carotid artery without vascular occlusion. 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 > Patient reports his swelling has significant improved while waiting at [facility] - ENT will see in the morning - Continue Unasyn - Hold off on further steroids for now has he has had significant improvement in his swelling per his report - Continue n.p.o. for now  Hypertension -Holding home antihypertensives in the setting of his dysphagia pending ENT evaluation  Hypothyroidism - Continue to hold Synthroid in the setting of his dysphagia pending ENT evaluation  DVT prophylaxis: SCDs  Code Status:   Full  Family Communication:  None on  admission Disposition Plan:   Patient is from:  Transfer from [place] to:  Pending clinical course  Anticipated DC date:  Pending clinical course  Anticipated DC barriers: None  Consults called:  ENT on call, Dr. Constance Holster, notified on patient arrival Admission status:  Observation, telemetry  Severity of Illness: The appropriate patient status for this patient is OBSERVATION. Observation status is judged to be reasonable and necessary in order to  provide the required intensity of service to ensure the patient's safety. The patient's presenting symptoms, physical exam findings, and initial radiographic and laboratory data in the context of their medical condition is felt to place them at decreased risk for further clinical deterioration. Furthermore, it is anticipated that the patient will be medically stable for discharge from the hospital within 2 midnights of admission. The following factors support the patient status of observation.   " The patient's presenting symptoms include dysphagia. " The physical exam findings include stable, hoarse. " The initial radiographic and laboratory data are concerning for nasal pharyngeal mass.   Marcelyn Bruins MD [facility]  How to contact the [facility] Attending or Consulting provider [place] or covering provider during after hours [place], for this patient?   1. Check the care team in [facility] and look for a) attending/consulting [facility] provider listed and b) the [facility] team listed 2. Log into [email or web address] and use [place]'s universal password to access. If you do not have the password, please contact the hospital operator. 3. Locate the [facility] provider you are looking for under [facility] and page to a number that you can be directly reached. 4. If you still have difficulty reaching the provider, please page the [facility] (Director on Call) for the Hospitalists listed on amion for assistance.  09/01/2020, 11:34 PM

## 2020-09-01 NOTE — ED Notes (Addendum)
Pt and family updated on transfer status, advised waiting to hear back from [facility] for transportation. Pt and family member verbalized understanding.

## 2020-09-01 NOTE — ED Notes (Signed)
Pt and family updated on transfer status, advised that [facility] is waiting to hear from accepting facility and that transfer will be after 0700. Pt and family member verbalized understanding.

## 2020-09-01 NOTE — ED Notes (Signed)
Charge RN requested ED secretary to call to get update [facility]. Pt is still on wait list.

## 2020-09-01 NOTE — ED Notes (Signed)
Care @ [facility] called, accepted to [facility] bed [facility], call [phone] for report. [facility] on the way to transport.

## 2020-09-01 NOTE — ED Provider Notes (Signed)
-----------------------------------------   9:28 AM on 09/01/2020 -----------------------------------------  Patient continues to await transfer to [facility] for operative intervention on nasal mass.  Scheduled antibiotics have been ordered and given patient remains n.p.o., we will start him on maintenance fluids.   Blake Divine, MD 09/01/20 [phone]

## 2020-09-02 ENCOUNTER — Other Ambulatory Visit: Payer: Self-pay

## 2020-09-02 DIAGNOSIS — R1312 Dysphagia, oropharyngeal phase: Secondary | ICD-10-CM | POA: Diagnosis not present

## 2020-09-02 DIAGNOSIS — D649 Anemia, unspecified: Secondary | ICD-10-CM | POA: Diagnosis present

## 2020-09-02 DIAGNOSIS — R1310 Dysphagia, unspecified: Secondary | ICD-10-CM | POA: Diagnosis present

## 2020-09-02 DIAGNOSIS — Z923 Personal history of irradiation: Secondary | ICD-10-CM | POA: Diagnosis not present

## 2020-09-02 DIAGNOSIS — Z9221 Personal history of antineoplastic chemotherapy: Secondary | ICD-10-CM | POA: Diagnosis not present

## 2020-09-02 DIAGNOSIS — E785 Hyperlipidemia, unspecified: Secondary | ICD-10-CM | POA: Diagnosis present

## 2020-09-02 DIAGNOSIS — H6530 Chronic mucoid otitis media, unspecified ear: Secondary | ICD-10-CM | POA: Diagnosis present

## 2020-09-02 DIAGNOSIS — Z87891 Personal history of nicotine dependence: Secondary | ICD-10-CM | POA: Diagnosis not present

## 2020-09-02 DIAGNOSIS — I10 Essential (primary) hypertension: Secondary | ICD-10-CM

## 2020-09-02 DIAGNOSIS — J392 Other diseases of pharynx: Secondary | ICD-10-CM | POA: Diagnosis present

## 2020-09-02 DIAGNOSIS — E876 Hypokalemia: Secondary | ICD-10-CM | POA: Diagnosis not present

## 2020-09-02 DIAGNOSIS — Z20822 Contact with and (suspected) exposure to covid-19: Secondary | ICD-10-CM | POA: Diagnosis present

## 2020-09-02 DIAGNOSIS — Z79899 Other long term (current) drug therapy: Secondary | ICD-10-CM | POA: Diagnosis not present

## 2020-09-02 DIAGNOSIS — Z96651 Presence of right artificial knee joint: Secondary | ICD-10-CM | POA: Diagnosis present

## 2020-09-02 DIAGNOSIS — R221 Localized swelling, mass and lump, neck: Secondary | ICD-10-CM | POA: Diagnosis present

## 2020-09-02 DIAGNOSIS — Z972 Presence of dental prosthetic device (complete) (partial): Secondary | ICD-10-CM | POA: Diagnosis not present

## 2020-09-02 DIAGNOSIS — Z8522 Personal history of malignant neoplasm of nasal cavities, middle ear, and accessory sinuses: Secondary | ICD-10-CM | POA: Diagnosis not present

## 2020-09-02 DIAGNOSIS — Z974 Presence of external hearing-aid: Secondary | ICD-10-CM | POA: Diagnosis not present

## 2020-09-02 DIAGNOSIS — H9190 Unspecified hearing loss, unspecified ear: Secondary | ICD-10-CM | POA: Diagnosis present

## 2020-09-02 DIAGNOSIS — Z79890 Hormone replacement therapy (postmenopausal): Secondary | ICD-10-CM | POA: Diagnosis not present

## 2020-09-02 DIAGNOSIS — E039 Hypothyroidism, unspecified: Secondary | ICD-10-CM | POA: Diagnosis present

## 2020-09-02 DIAGNOSIS — J3489 Other specified disorders of nose and nasal sinuses: Secondary | ICD-10-CM | POA: Diagnosis present

## 2020-09-02 DIAGNOSIS — Z791 Long term (current) use of non-steroidal anti-inflammatories (NSAID): Secondary | ICD-10-CM | POA: Diagnosis not present

## 2020-09-02 DIAGNOSIS — Z888 Allergy status to other drugs, medicaments and biological substances status: Secondary | ICD-10-CM | POA: Diagnosis not present

## 2020-09-02 LAB — BASIC METABOLIC PANEL
Anion gap: 10 (ref 5–15)
BUN: 21 mg/dL (ref 8–23)
CO2: 24 mmol/L (ref 22–32)
Calcium: 8.7 mg/dL — ABNORMAL LOW (ref 8.9–10.3)
Chloride: 108 mmol/L (ref 98–111)
Creatinine, Ser: 0.97 mg/dL (ref 0.61–1.24)
GFR, Estimated: >60 mL/min (ref >60)
Glucose, Bld: 119 mg/dL — ABNORMAL HIGH (ref 70–99)
Potassium: 3.8 mmol/L (ref 3.5–5.1)
Sodium: 142 mmol/L (ref 135–145)

## 2020-09-02 LAB — CBC
HCT: 30.9 % — ABNORMAL LOW (ref 39.0–52.0)
Hemoglobin: 10.2 g/dL — ABNORMAL LOW (ref 13.0–17.0)
MCH: 27.9 pg (ref 26.0–34.0)
MCHC: 33 g/dL (ref 30.0–36.0)
MCV: 84.7 fL (ref 80.0–100.0)
Platelets: 341 10*3/uL (ref 150–400)
RBC: 3.65 MIL/uL — ABNORMAL LOW (ref 4.22–5.81)
RDW: 13.2 % (ref 11.5–15.5)
WBC: 11.3 10*3/uL — ABNORMAL HIGH (ref 4.0–10.5)
nRBC: 0 % (ref 0.0–0.2)

## 2020-09-02 LAB — HIV ANTIBODY (ROUTINE TESTING W REFLEX): HIV Screen 4th Generation wRfx: NONREACTIVE

## 2020-09-02 LAB — PROTIME-INR
INR: 1.2 (ref 0.8–1.2)
Prothrombin Time: 14.3 seconds (ref 11.4–15.2)

## 2020-09-02 MED ORDER — SODIUM CHLORIDE 0.45 % IV SOLN: INTRAVENOUS | Status: DC

## 2020-09-02 NOTE — Progress Notes (Signed)
[facility] PROGRESS NOTE   Marcus Wright IRC:[id number] DOB: 15-Mar-1956 DOA: 09/01/2020  PCP: Valerie Roys, DO  Brief History/Interval Summary: 64 y.o. male with medical history significant of history of sinus cancer in 2003, chronic mucoid otitis media, degenerative disc disease, dysphagia, ear mass, hearing loss, history of neck surgery, hypertension, hyperlipidemia, hypothyroidism who presented with dysphagia.  As above patient has a history of sinus cancer status post radiation therapy and chemo in 2003.  He has a long history of multiple ENT related issues since that time.  An evaluation in November patient had a CT scan done on the 22nd which showed this nasopharyngeal mass.  On the morning of 12/5 patient woke up in had a choking sensation.  And during his choking sensations he had some difficulty breathing.  After he got to the ED at [facility] his breathing had improved but he continued to have dysphagia and he was made n.p.o.   patient was subsequently transferred to this facility still be seen by his ENT consultant.    Reason for Visit: Nasopharyngeal tumor  Consultants: ENT, Dr. Fredric Dine  Procedures: None yet.  Antibiotics: Anti-infectives (From admission, onward)   Start     Dose/Rate Route Frequency Ordered Stop   09/02/20 0000  Ampicillin-Sulbactam (UNASYN) 3 g in sodium chloride 0.9 % 100 mL IVPB        3 g 200 mL/hr over 30 Minutes Intravenous Every 6 hours 09/01/20 2304        Subjective/Interval History: Patient complains of difficulty swallowing.  Some shortness of breath but denies any chest pain.  Has some pain in his throat area.     Assessment/Plan:  Nasopharyngeal mass with dysphagia Discussed with ENT.  Plan is for surgical intervention tomorrow.  Patient was given steroids at [facility] which is currently on hold.  There was some concern for infection as well so the patient was placed on Unasyn which is being continued.  Pain control.  Continue with  n.p.o. status.  Essential hypertension Holding home medications.  Monitor blood pressures closely.  Blood pressure seems to be reasonably well controlled.  History of hypothyroidism Holding Synthroid for now.  Normocytic anemia No evidence of overt bleeding.  Continue to monitor.   DVT Prophylaxis: SCDs for now Code Status: Full code Family Communication: Discussed with the patient.  No family at bedside Disposition Plan: Hopefully return home in improved  Status is: Observation  The patient will require care spanning > 2 midnights and should be moved to inpatient because: Ongoing diagnostic testing needed not appropriate for outpatient work up and Inpatient level of care appropriate due to severity of illness  Dispo: The patient is from: Home              Anticipated d/c is to: Home              Anticipated d/c date is: 3 days              Patient currently is not medically stable to d/c.       Medications:  Scheduled: . sodium chloride flush  3 mL Intravenous Q12H   Continuous: . ampicillin-sulbactam (UNASYN) IV 3 g (09/02/20 0604)   PRN:   Objective:  Vital Signs  Vitals:   08/27/20 1302 09/01/20 2235 09/02/20 0327 09/02/20 0808  BP:  98/66 90/60 105/70  Pulse:  71 (!) 57 62  Resp:  16 16 20   Temp:  97.7 F (36.5 C) 97.9 F (36.6 C) 97.8  F (36.6 C)  TempSrc:  Oral Oral Oral  SpO2:  100% 97% 98%  Weight: 77.1 kg 77 kg    Height: 6' (1.829 m) 6' (1.829 m)      Intake/Output Summary (Last 24 hours) at 09/02/2020 1208 Last data filed at 09/02/2020 0325 Gross per 24 hour  Intake 99.43 ml  Output 475 ml  Net -375.57 ml   Filed Weights   08/27/20 1302 09/01/20 2235  Weight: 77.1 kg 77 kg    General appearance: Awake alert.  In no distress Resp: Clear to auscultation bilaterally.  Normal effort Cardio: S1-S2 is normal regular.  No S3-S4.  No rubs murmurs or bruit GI: Abdomen is soft.  Nontender nondistended.  Bowel sounds are present normal.  No  masses organomegaly Extremities: No edema.   Neurologic:  No focal neurological deficits.    Lab Results:  Data Reviewed: I have personally reviewed following labs and imaging studies  CBC: Recent Labs  Lab 08/31/20 0856 09/01/20 0506 09/02/20 0351  WBC 7.0 5.5 11.3*  NEUTROABS  --  4.5  --   HGB 11.3* 11.2* 10.2*  HCT 33.4* 33.3* 30.9*  MCV 84.3 84.1 84.7  PLT 353 324 716    Basic Metabolic Panel: Recent Labs  Lab 08/31/20 0856 09/01/20 0506 09/02/20 0351  NA 138  --  142  K 4.1  --  3.8  CL 102  --  108  CO2 27  --  24  GLUCOSE 116*  --  119*  BUN 8  --  21  CREATININE 1.00 0.93 0.97  CALCIUM 9.4  --  8.7*    GFR: Estimated Creatinine Clearance: 81.6 mL/min (by C-G formula based on SCr of 0.97 mg/dL).  Coagulation Profile: Recent Labs  Lab 09/02/20 0351  INR 1.2      Recent Results (from the past 240 hour(s))  SARS CORONAVIRUS 2 (TAT 6-24 HRS) Nasopharyngeal Nasopharyngeal Swab     Status: None   Collection Time: 08/28/20 11:30 AM   Specimen: Nasopharyngeal Swab  Result Value Ref Range Status   SARS Coronavirus 2 NEGATIVE NEGATIVE Final    Comment: (NOTE) SARS-CoV-2 target nucleic acids are NOT DETECTED.  The SARS-CoV-2 RNA is generally detectable in upper and lower respiratory specimens during the acute phase of infection. Negative results do not preclude SARS-CoV-2 infection, do not rule out co-infections with other pathogens, and should not be used as the sole basis for treatment or other patient management decisions. Negative results must be combined with clinical observations, patient history, and epidemiological information. The expected result is Negative.  Fact Sheet for Patients: SugarRoll.be  Fact Sheet for Healthcare Providers: https://www.woods-mathews.com/  This test is not yet approved or cleared by the [place] FDA and  has been authorized for detection and/or diagnosis of  SARS-CoV-2 by FDA under an Emergency Use Authorization (EUA). This EUA will remain  in effect (meaning this test can be used) for the duration of the COVID-19 declaration under Se ction 564(b)(1) of the Act, 21 U.S.C. section 360bbb-3(b)(1), unless the authorization is terminated or revoked sooner.  Performed at [facility], [place] [address] [place], [place] [address]   Resp Panel by RT-PCR (Flu A&B, Covid) Nasopharyngeal Swab     Status: None   Collection Time: 08/31/20 10:06 AM   Specimen: Nasopharyngeal Swab; Nasopharyngeal(NP) swabs in vial transport medium  Result Value Ref Range Status   SARS Coronavirus 2 by RT PCR NEGATIVE NEGATIVE Final    Comment: (NOTE) SARS-CoV-2 target  nucleic acids are NOT DETECTED.  The SARS-CoV-2 RNA is generally detectable in upper respiratory specimens during the acute phase of infection. The lowest concentration of SARS-CoV-2 viral copies this assay can detect is 138 copies/mL. A negative result does not preclude SARS-Cov-2 infection and should not be used as the sole basis for treatment or other patient management decisions. A negative result may occur with  improper specimen collection/handling, submission of specimen other than nasopharyngeal swab, presence of viral mutation(s) within the areas targeted by this assay, and inadequate number of viral copies(<138 copies/mL). A negative result must be combined with clinical observations, patient history, and epidemiological information. The expected result is Negative.  Fact Sheet for Patients:  EntrepreneurPulse.com.au  Fact Sheet for Healthcare Providers:  IncredibleEmployment.be  This test is no t yet approved or cleared by the [place] FDA and  has been authorized for detection and/or diagnosis of SARS-CoV-2 by FDA under an Emergency Use Authorization (EUA). This EUA will remain  in effect (meaning this test can be used) for the duration of  the COVID-19 declaration under Section 564(b)(1) of the Act, 21 U.S.C.section 360bbb-3(b)(1), unless the authorization is terminated  or revoked sooner.       Influenza A by PCR NEGATIVE NEGATIVE Final   Influenza B by PCR NEGATIVE NEGATIVE Final    Comment: (NOTE) The Xpert Xpress SARS-CoV-2/FLU/RSV plus assay is intended as an aid in the diagnosis of influenza from Nasopharyngeal swab specimens and should not be used as a sole basis for treatment. Nasal washings and aspirates are unacceptable for Xpert Xpress SARS-CoV-2/FLU/RSV testing.  Fact Sheet for Patients: EntrepreneurPulse.com.au  Fact Sheet for Healthcare Providers: IncredibleEmployment.be  This test is not yet approved or cleared by the [place] FDA and has been authorized for detection and/or diagnosis of SARS-CoV-2 by FDA under an Emergency Use Authorization (EUA). This EUA will remain in effect (meaning this test can be used) for the duration of the COVID-19 declaration under Section 564(b)(1) of the Act, 21 U.S.C. section 360bbb-3(b)(1), unless the authorization is terminated or revoked.  Performed at [facility], [address] [place], [place] [address]   Aerobic/Anaerobic Culture (surgical/deep wound)     Status: None (Preliminary result)   Collection Time: 08/31/20  2:33 PM   Specimen: Nose; Tissue  Result Value Ref Range Status   Specimen Description   Final    NOSE Performed at [facility], [address], [place], [place] [address]    Special Requests   Final    Normal Performed at [facility], [address] [place], [place] [address]    Gram Stain   Final    FEW WBC PRESENT, PREDOMINANTLY PMN FEW GRAM POSITIVE COCCI IN CLUSTERS    Culture   Final    CULTURE REINCUBATED FOR BETTER GROWTH Performed at [facility], [place] [address] [place], [place] [address]    Report Status PENDING  Incomplete      Radiology  Studies: No results found.     LOS: 0 days   Cashe Gatt [employer] on [email or web address]  09/02/2020, 12:08 PM

## 2020-09-02 NOTE — Plan of Care (Signed)
  Problem: Education: Goal: Knowledge of General Education information will improve Description: Including pain rating scale, medication(s)/side effects and non-pharmacologic comfort measures Outcome: Progressing   Problem: Elimination: Goal: Will not experience complications related to bowel motility Outcome: Progressing Goal: Will not experience complications related to urinary retention Outcome: Progressing   Problem: Safety: Goal: Ability to remain free from injury will improve Outcome: Progressing   

## 2020-09-02 NOTE — Plan of Care (Signed)
  Problem: Education: Goal: Knowledge of General Education information will improve Description: Including pain rating scale, medication(s)/side effects and non-pharmacologic comfort measures 09/02/2020 0547 by Dionisio David, RN Outcome: Progressing 09/02/2020 0547 by Dionisio David, RN Outcome: Progressing 09/02/2020 0547 by Dionisio David, RN Outcome: Progressing   Problem: Health Behavior/Discharge Planning: Goal: Ability to manage health-related needs will improve 09/02/2020 0547 by Dionisio David, RN Outcome: Progressing 09/02/2020 0547 by Dionisio David, RN Outcome: Progressing 09/02/2020 0547 by Dionisio David, RN Outcome: Progressing   Problem: Clinical Measurements: Goal: Ability to maintain clinical measurements within normal limits will improve 09/02/2020 0547 by Dionisio David, RN Outcome: Progressing 09/02/2020 0547 by Dionisio David, RN Outcome: Progressing 09/02/2020 0547 by Dionisio David, RN Outcome: Progressing Goal: Will remain free from infection 09/02/2020 0547 by Dionisio David, RN Outcome: Progressing 09/02/2020 0547 by Dionisio David, RN Outcome: Progressing 09/02/2020 0547 by Dionisio David, RN Outcome: Progressing Goal: Diagnostic test results will improve 09/02/2020 0547 by Dionisio David, RN Outcome: Progressing 09/02/2020 0547 by Dionisio David, RN Outcome: Progressing 09/02/2020 0547 by Dionisio David, RN Outcome: Progressing Goal: Respiratory complications will improve 09/02/2020 0547 by Dionisio David, RN Outcome: Progressing 09/02/2020 0547 by Dionisio David, RN Outcome: Progressing 09/02/2020 0547 by Dionisio David, RN Outcome: Progressing Goal: Cardiovascular complication will be avoided 09/02/2020 0547 by Dionisio David, RN Outcome: Progressing 09/02/2020 0547 by Dionisio David, RN Outcome: Progressing 09/02/2020 0547 by Dionisio David, RN Outcome: Progressing   Problem:  Activity: Goal: Risk for activity intolerance will decrease 09/02/2020 0547 by Dionisio David, RN Outcome: Progressing 09/02/2020 0547 by Dionisio David, RN Outcome: Progressing 09/02/2020 0547 by Dionisio David, RN Outcome: Progressing   Problem: Nutrition: Goal: Adequate nutrition will be maintained 09/02/2020 0547 by Dionisio David, RN Outcome: Progressing 09/02/2020 0547 by Dionisio David, RN Outcome: Progressing 09/02/2020 0547 by Dionisio David, RN Outcome: Progressing   Problem: Coping: Goal: Level of anxiety will decrease 09/02/2020 0547 by Dionisio David, RN Outcome: Progressing 09/02/2020 0547 by Dionisio David, RN Outcome: Progressing 09/02/2020 0547 by Dionisio David, RN Outcome: Progressing   Problem: Elimination: Goal: Will not experience complications related to bowel motility 09/02/2020 0547 by Dionisio David, RN Outcome: Progressing 09/02/2020 0547 by Dionisio David, RN Outcome: Progressing 09/02/2020 0547 by Dionisio David, RN Outcome: Progressing Goal: Will not experience complications related to urinary retention 09/02/2020 0547 by Dionisio David, RN Outcome: Progressing 09/02/2020 0547 by Dionisio David, RN Outcome: Progressing 09/02/2020 0547 by Dionisio David, RN Outcome: Progressing   Problem: Pain Managment: Goal: General experience of comfort will improve 09/02/2020 0547 by Dionisio David, RN Outcome: Progressing 09/02/2020 0547 by Dionisio David, RN Outcome: Progressing 09/02/2020 0547 by Dionisio David, RN Outcome: Progressing   Problem: Safety: Goal: Ability to remain free from injury will improve 09/02/2020 0547 by Dionisio David, RN Outcome: Progressing 09/02/2020 0547 by Dionisio David, RN Outcome: Progressing 09/02/2020 0547 by Dionisio David, RN Outcome: Progressing   Problem: Skin Integrity: Goal: Risk for impaired skin integrity will decrease 09/02/2020 0547 by Dionisio David, RN Outcome: Progressing 09/02/2020 0547 by Dionisio David, RN Outcome: Progressing 09/02/2020 0547 by Dionisio David, RN Outcome: Progressing

## 2020-09-02 NOTE — Progress Notes (Signed)
OTO HNS PROGRESS NOTE  Patient scheduled for nasal endoscopy with biopsy and frozen section tomorrow afternoon at 1:30 PM.  This was discussed extensively with patient's step-son, who is his power of attorney.  Risks benefits and alternatives of the planned procedure were discussed, patient's step-son agreed to proceed.   Plan: To OR tomorrow for nasal endoscopy with biopsy N.p.o. at midnight for planned procedure, obtain consent Patient will require SLP evaluation prior to discharge to evaluate ability to swallow, okay for diet from ENT perspective if able to pass bedside swallow assessment Pending results of frozen section, will plan on consulting medical oncology and radiation oncology prior to discharge Follow-up culture results obtained at [facility] ED, final results pending Continue Unasyn Further recommendations to follow procedure tomorrow  Thank you for allowing me to participate in the care of this patient. Please do not hesitate to contact me with any questions or concerns.   Jason Coop, [facility] Cell: [phone]

## 2020-09-03 ENCOUNTER — Encounter (HOSPITAL_COMMUNITY): Payer: Self-pay | Admitting: Internal Medicine

## 2020-09-03 ENCOUNTER — Inpatient Hospital Stay (HOSPITAL_COMMUNITY): Payer: Medicare Other | Admitting: Anesthesiology

## 2020-09-03 ENCOUNTER — Encounter (HOSPITAL_COMMUNITY)
Admission: RE | Disposition: A | Discharge status: Home / Self Care | Payer: Self-pay | Source: Home / Self Care | Attending: Internal Medicine

## 2020-09-03 DIAGNOSIS — J392 Other diseases of pharynx: Secondary | ICD-10-CM | POA: Diagnosis not present

## 2020-09-03 DIAGNOSIS — Z8522 Personal history of malignant neoplasm of nasal cavities, middle ear, and accessory sinuses: Secondary | ICD-10-CM | POA: Diagnosis not present

## 2020-09-03 DIAGNOSIS — E876 Hypokalemia: Secondary | ICD-10-CM

## 2020-09-03 HISTORY — PX: SINUS ENDO W/FUSION: SHX777

## 2020-09-03 LAB — COMPREHENSIVE METABOLIC PANEL
ALT: 16 U/L (ref 0–44)
AST: 24 U/L (ref 15–41)
Albumin: 3.1 g/dL — ABNORMAL LOW (ref 3.5–5.0)
Alkaline Phosphatase: 51 U/L (ref 38–126)
Anion gap: 7 (ref 5–15)
BUN: 17 mg/dL (ref 8–23)
CO2: 25 mmol/L (ref 22–32)
Calcium: 8.3 mg/dL — ABNORMAL LOW (ref 8.9–10.3)
Chloride: 109 mmol/L (ref 98–111)
Creatinine, Ser: 0.89 mg/dL (ref 0.61–1.24)
GFR, Estimated: >60 mL/min (ref >60)
Glucose, Bld: 99 mg/dL (ref 70–99)
Potassium: 3.4 mmol/L — ABNORMAL LOW (ref 3.5–5.1)
Sodium: 141 mmol/L (ref 135–145)
Total Bilirubin: 0.4 mg/dL (ref 0.3–1.2)
Total Protein: 5.8 g/dL — ABNORMAL LOW (ref 6.5–8.1)

## 2020-09-03 LAB — CBC
HCT: 29.9 % — ABNORMAL LOW (ref 39.0–52.0)
Hemoglobin: 9.8 g/dL — ABNORMAL LOW (ref 13.0–17.0)
MCH: 28.2 pg (ref 26.0–34.0)
MCHC: 32.8 g/dL (ref 30.0–36.0)
MCV: 85.9 fL (ref 80.0–100.0)
Platelets: 306 10*3/uL (ref 150–400)
RBC: 3.48 MIL/uL — ABNORMAL LOW (ref 4.22–5.81)
RDW: 13.3 % (ref 11.5–15.5)
WBC: 7.3 10*3/uL (ref 4.0–10.5)
nRBC: 0 % (ref 0.0–0.2)

## 2020-09-03 SURGERY — SINUS SURGERY, ENDOSCOPIC, USING COMPUTER-ASSISTED NAVIGATION
Anesthesia: General | Site: Nose | Laterality: Right

## 2020-09-03 MED ORDER — LIDOCAINE HCL (PF) 2 % IJ SOLN
INTRAMUSCULAR | Status: AC
  Filled 2020-09-03: qty 15

## 2020-09-03 MED ORDER — EPHEDRINE 5 MG/ML INJ
INTRAVENOUS | Status: AC
  Filled 2020-09-03: qty 10

## 2020-09-03 MED ORDER — ALBUMIN HUMAN 5 % IV SOLN: INTRAVENOUS | Status: DC | PRN

## 2020-09-03 MED ORDER — OXYMETAZOLINE HCL 0.05 % NA SOLN
NASAL | Status: AC
  Filled 2020-09-03: qty 30

## 2020-09-03 MED ORDER — FENTANYL CITRATE (PF) 250 MCG/5ML IJ SOLN
INTRAMUSCULAR | Status: DC | PRN
  Administered 2020-09-03: 100 ug via INTRAVENOUS

## 2020-09-03 MED ORDER — CHLORHEXIDINE GLUCONATE 0.12% ORAL RINSE (MEDLINE KIT): 15.0000 mL | OROMUCOSAL | Status: AC

## 2020-09-03 MED ORDER — LIDOCAINE 2% (20 MG/ML) 5 ML SYRINGE
INTRAMUSCULAR | Status: DC | PRN
  Administered 2020-09-03: 60 mg via INTRAVENOUS

## 2020-09-03 MED ORDER — PHENYLEPHRINE 40 MCG/ML (10ML) SYRINGE FOR IV PUSH (FOR BLOOD PRESSURE SUPPORT)
PREFILLED_SYRINGE | INTRAVENOUS | Status: AC
  Filled 2020-09-03: qty 10

## 2020-09-03 MED ORDER — FENTANYL CITRATE (PF) 250 MCG/5ML IJ SOLN
INTRAMUSCULAR | Status: AC
  Filled 2020-09-03: qty 5

## 2020-09-03 MED ORDER — SUGAMMADEX SODIUM 200 MG/2ML IV SOLN
INTRAVENOUS | Status: DC | PRN
  Administered 2020-09-03: 200 mg via INTRAVENOUS

## 2020-09-03 MED ORDER — AMISULPRIDE (ANTIEMETIC) 5 MG/2ML IV SOLN: 10.0000 mg | Freq: Once | INTRAVENOUS | Status: DC | PRN

## 2020-09-03 MED ORDER — LACTATED RINGERS IV SOLN: INTRAVENOUS | Status: DC

## 2020-09-03 MED ORDER — DEXAMETHASONE SODIUM PHOSPHATE 10 MG/ML IJ SOLN
INTRAMUSCULAR | Status: AC
  Filled 2020-09-03: qty 2

## 2020-09-03 MED ORDER — CHLORHEXIDINE GLUCONATE 0.12 % MT SOLN
OROMUCOSAL | Status: AC
  Filled 2020-09-03: qty 15

## 2020-09-03 MED ORDER — OXYMETAZOLINE HCL 0.05 % NA SOLN
NASAL | Status: DC | PRN
  Administered 2020-09-03: 1

## 2020-09-03 MED ORDER — POTASSIUM CHLORIDE IN NACL 20-0.45 MEQ/L-% IV SOLN
INTRAVENOUS | Status: DC
  Filled 2020-09-03 (×3): qty 1000

## 2020-09-03 MED ORDER — LIDOCAINE-EPINEPHRINE 1 %-1:100000 IJ SOLN
INTRAMUSCULAR | Status: AC
  Filled 2020-09-03: qty 1

## 2020-09-03 MED ORDER — ONDANSETRON HCL 4 MG/2ML IJ SOLN
INTRAMUSCULAR | Status: DC | PRN
  Administered 2020-09-03: 4 mg via INTRAVENOUS

## 2020-09-03 MED ORDER — ACETAMINOPHEN 500 MG PO TABS: 1000.0000 mg | ORAL_TABLET | Freq: Once | ORAL | Status: DC

## 2020-09-03 MED ORDER — PROPOFOL 10 MG/ML IV BOLUS
INTRAVENOUS | Status: DC | PRN
  Administered 2020-09-03: 140 mg via INTRAVENOUS

## 2020-09-03 MED ORDER — EPHEDRINE SULFATE-NACL 50-0.9 MG/10ML-% IV SOSY
PREFILLED_SYRINGE | INTRAVENOUS | Status: DC | PRN
  Administered 2020-09-03: 5 mg via INTRAVENOUS
  Administered 2020-09-03: 10 mg via INTRAVENOUS
  Administered 2020-09-03: 5 mg via INTRAVENOUS

## 2020-09-03 MED ORDER — ROCURONIUM BROMIDE 10 MG/ML (PF) SYRINGE
PREFILLED_SYRINGE | INTRAVENOUS | Status: DC | PRN
  Administered 2020-09-03: 40 mg via INTRAVENOUS

## 2020-09-03 MED ORDER — LIDOCAINE-EPINEPHRINE 1 %-1:100000 IJ SOLN
INTRAMUSCULAR | Status: DC | PRN
  Administered 2020-09-03: 1 mL

## 2020-09-03 MED ORDER — SUCCINYLCHOLINE CHLORIDE 200 MG/10ML IV SOSY
PREFILLED_SYRINGE | INTRAVENOUS | Status: AC
  Filled 2020-09-03: qty 20

## 2020-09-03 MED ORDER — FENTANYL CITRATE (PF) 100 MCG/2ML IJ SOLN: 25.0000 ug | INTRAMUSCULAR | Status: DC | PRN

## 2020-09-03 MED ORDER — ONDANSETRON HCL 4 MG/2ML IJ SOLN
INTRAMUSCULAR | Status: AC
  Filled 2020-09-03: qty 4

## 2020-09-03 MED ORDER — CEFAZOLIN SODIUM-DEXTROSE 2-4 GM/100ML-% IV SOLN
2.0000 g | INTRAVENOUS | Status: AC
  Administered 2020-09-03: 2 g via INTRAVENOUS
  Filled 2020-09-03: qty 100

## 2020-09-03 MED ORDER — MIDAZOLAM HCL 2 MG/2ML IJ SOLN
INTRAMUSCULAR | Status: AC
  Filled 2020-09-03: qty 2

## 2020-09-03 MED ORDER — POTASSIUM CHLORIDE 10 MEQ/100ML IV SOLN
10.0000 meq | INTRAVENOUS | Status: DC
  Administered 2020-09-03: 10 meq via INTRAVENOUS
  Filled 2020-09-03: qty 100

## 2020-09-03 MED ORDER — MIDAZOLAM HCL 5 MG/5ML IJ SOLN
INTRAMUSCULAR | Status: DC | PRN
  Administered 2020-09-03: 2 mg via INTRAVENOUS

## 2020-09-03 MED ORDER — DEXAMETHASONE SODIUM PHOSPHATE 10 MG/ML IJ SOLN
INTRAMUSCULAR | Status: DC | PRN
  Administered 2020-09-03: 10 mg via INTRAVENOUS

## 2020-09-03 MED ORDER — ROCURONIUM BROMIDE 10 MG/ML (PF) SYRINGE
PREFILLED_SYRINGE | INTRAVENOUS | Status: AC
  Filled 2020-09-03: qty 10

## 2020-09-03 MED ORDER — PROPOFOL 10 MG/ML IV BOLUS
INTRAVENOUS | Status: AC
  Filled 2020-09-03: qty 40

## 2020-09-03 MED ORDER — HEMOSTATIC AGENTS (NO CHARGE) OPTIME
TOPICAL | Status: DC | PRN
  Administered 2020-09-03: 1 via TOPICAL

## 2020-09-03 MED ORDER — PHENYLEPHRINE 40 MCG/ML (10ML) SYRINGE FOR IV PUSH (FOR BLOOD PRESSURE SUPPORT)
PREFILLED_SYRINGE | INTRAVENOUS | Status: DC | PRN
  Administered 2020-09-03: 80 ug via INTRAVENOUS
  Administered 2020-09-03: 120 ug via INTRAVENOUS
  Administered 2020-09-03: 40 ug via INTRAVENOUS

## 2020-09-03 SURGICAL SUPPLY — 42 items
BLADE SURG 15 STRL LF DISP TIS (BLADE) ×1 IMPLANT
BLADE SURG 15 STRL SS (BLADE) ×1
CANISTER SUCT 3000ML PPV (MISCELLANEOUS) ×4 IMPLANT
COAGULATOR SUCT SWTCH 10FR 6 (ELECTROSURGICAL) ×2 IMPLANT
COVER WAND RF STERILE (DRAPES) ×2 IMPLANT
DECANTER SPIKE VIAL GLASS SM (MISCELLANEOUS) ×2 IMPLANT
DRAPE HALF SHEET 40X57 (DRAPES) IMPLANT
DRESSING NASAL KENNEDY 3.5X.9 (MISCELLANEOUS) IMPLANT
DRSG NASAL KENNEDY 3.5X.9 (MISCELLANEOUS)
DRSG TELFA 3X8 NADH (GAUZE/BANDAGES/DRESSINGS) ×2 IMPLANT
ELECT REM PT RETURN 9FT ADLT (ELECTROSURGICAL) ×2
ELECTRODE REM PT RTRN 9FT ADLT (ELECTROSURGICAL) ×1 IMPLANT
FILTER ARTHROSCOPY CONVERTOR (FILTER) ×2 IMPLANT
GLOVE BIOGEL M 7.0 STRL (GLOVE) ×4 IMPLANT
GOWN STRL REUS W/ TWL LRG LVL3 (GOWN DISPOSABLE) ×2 IMPLANT
GOWN STRL REUS W/TWL LRG LVL3 (GOWN DISPOSABLE) ×2
HEMOSTAT ARISTA ABSORB 3G PWDR (HEMOSTASIS) ×2 IMPLANT
KIT BASIN OR (CUSTOM PROCEDURE TRAY) ×2 IMPLANT
KIT TURNOVER KIT B (KITS) ×2 IMPLANT
NEEDLE 18GX1X1/2 (RX/OR ONLY) (NEEDLE) IMPLANT
NEEDLE HYPO 25GX1X1/2 BEV (NEEDLE) IMPLANT
NEEDLE SPNL 22GX3.5 QUINCKE BK (NEEDLE) ×2 IMPLANT
NS IRRIG 1000ML POUR BTL (IV SOLUTION) ×2 IMPLANT
PAD ARMBOARD 7.5X6 YLW CONV (MISCELLANEOUS) ×4 IMPLANT
PATTIES SURGICAL .5 X3 (DISPOSABLE) ×2 IMPLANT
PENCIL SMOKE EVACUATOR (MISCELLANEOUS) ×2 IMPLANT
SOL ANTI FOG 6CC (MISCELLANEOUS) ×1 IMPLANT
SOLUTION ANTI FOG 6CC (MISCELLANEOUS) ×1
SPECIMEN JAR SMALL (MISCELLANEOUS) ×2 IMPLANT
SPONGE NEURO XRAY DETECT 1X3 (DISPOSABLE) IMPLANT
SUT CHROMIC 5 0 P 3 (SUTURE) IMPLANT
SUT ETHILON 3 0 FSL (SUTURE) IMPLANT
SUT PLAIN 4 0 ~~LOC~~ 1 (SUTURE) IMPLANT
SWAB COLLECTION DEVICE MRSA (MISCELLANEOUS) ×2 IMPLANT
SWAB CULTURE ESWAB REG 1ML (MISCELLANEOUS) ×2 IMPLANT
SYR BULB EAR ULCER 3OZ GRN STR (SYRINGE) IMPLANT
SYR CONTROL 10ML LL (SYRINGE) ×2 IMPLANT
TOWEL GREEN STERILE FF (TOWEL DISPOSABLE) ×2 IMPLANT
TRAY ENT MC OR (CUSTOM PROCEDURE TRAY) ×2 IMPLANT
TUBE CONNECTING 12X1/4 (SUCTIONS) ×2 IMPLANT
TUBING EXTENTION W/L.L. (IV SETS) ×2 IMPLANT
WATER STERILE IRR 1000ML POUR (IV SOLUTION) ×2 IMPLANT

## 2020-09-03 NOTE — Interval H&P Note (Signed)
History and Physical Interval Note:  09/03/2020 2:33 PM  Marcus Wright  has presented today for surgery, with the diagnosis of right nasopharyngeal mass.  The various methods of treatment have been discussed with the patient and family. After consideration of risks, benefits and other options for treatment, the patient has consented to  Biopsy of right nasopharyngeal mass as a surgical intervention.  The patient's history has been reviewed, patient examined, no change in status, stable for surgery.  I have reviewed the patient's chart and labs.  Questions were answered to the patient's satisfaction.     [name] A Serapio Edelson

## 2020-09-03 NOTE — Op Note (Addendum)
OPERATIVE NOTE  Marcus Wright Date/Time of Admission: 09/01/2020 10:32 PM  CSN: [id number] Attending Provider: Bonnielee Haff, MD Room/Bed: MCPO/NONE DOB: 07/29/1956 Age: 64 y.o.   Pre-Op Diagnosis: Right nasopharyngeal mass  Post-Op Diagnosis: Right nasopharyngeal mass  Procedure: Procedure(s): Biopsy of Right nasopharyngeal mass with frozen sections  Anesthesia: General  Surgeon(s): [place], DO  Staff: Circulator: Hal Morales, RN; Rexanne Mano, RN Relief Scrub: Teschner, Mindy K, CST Scrub Person: Ovidio Kin F, CST  Implants: * No implants in log *  Specimens: ID Type Source Tests Collected by Time Destination  1 : Right Nasopharygeal Mass  ENT PATH Other SURGICAL PATHOLOGY Johnathen Testa A, DO 09/03/2020 1601   2 : Sphenoid Sinus Mucous ENT PATH Other SURGICAL PATHOLOGY Soila Printup, [place], DO 09/03/2020 1606   3 : Right Nasopharygeal Mass  ENT PATH Other SURGICAL PATHOLOGY Tanija Germani, [place], DO 09/03/2020 1610   4 : Right Nasopharygeal Mass #2  ENT PATH Other SURGICAL PATHOLOGY Wrenly Lauritsen, Rafael Gonzalez, DO 09/03/2020 1651   5 : Right Nasopharygeal Mass #3 ENT PATH Other SURGICAL PATHOLOGY Doreene Forrey A, DO 09/03/2020 1655   A : Right Nasopharygeal Mass  ENT PATH Other CULTURE, FUNGUS WITHOUT SMEAR, AEROBIC/ANAEROBIC CULTURE (SURGICAL/DEEP WOUND) Harlan Vinal A, DO 95/02/2129 [id number]     Complications: None  EBL: 10 ML  Condition: stable  Operative Findings:  No obvious exophytic mass lesion, inflammatory mucosa of the right nasopharynx, obliteration of the right eustachian tube orifice, scant purulent drainage noted, cultured. Mass consistent which appeared to be a fungal ball was removed from the right nasopharynx. Photos taken, placed in chart. Frozen sections demonstrated squamous atypia with no obvious dysplasia or malignancy.   Description of Operation: After being properly identified in the preoperative  holding area, the patient was brought into the operating suite. He was placed supine on the operating table. A pre-procedural time-out was performed. Pre-operative antibiotics and steroids were administered. After induction of general anesthesia, the patient was successfully intubated with confirmation of tube placement via CO2 return. Eyes were taped closed. Pledgets soaked in 1% Lidocaine with 1:100,000 epinephrine were placed in each nostril. The face was draped sterilely.  Using a 0 degree endoscope, the right nasopharynx was visualized, and no exophytic mass lesion was noted.  Scant purulent drainage was noted, which was cultured. Using Tru-Cut forceps, several biopsies were taken of the mucosa overlying the mass noted on CT of the neck.  These were sent as frozen specimens to pathology.  No obvious dysplastic change was noted on frozen section.  Additional deep biopsies were then taken, and what appeared to be a fungal ball was noted to be deep to the biopsy tissues.  This was grasped using Blakesley forceps, and removed en bloc.  Espina section, and half was sent as frozen section with the other half as a permanent specimen.  Frozen section pathology did demonstrate some squamous atypia.  A 30 degree endoscope was then used to visualize the cavity from which the fungal ball was removed, and no further atypical tissue was noted.  The area was irrigated and inspected for bleeding, mild bleeding from biopsy sites was controlled using Arista.A flexible suction catheter was used to remove fluid and blood from the oropharynx and hypopharynx. He was returned to the care of the anesthesia team. He was then weaned from the anesthetic and transported to the PACU in stable condition.  Jason Coop, [facility]  09/03/2020

## 2020-09-03 NOTE — Progress Notes (Signed)
SLP Cancellation Note  Patient Details Name: Kaj Vasil MRN: [id number] DOB: 1956-06-12   Cancelled treatment:       Reason Eval/Treat Not Completed: Patient at procedure or test/unavailable (Pt off unit for procedure. SLP will f/u on subsequent date. )  Azucena Dart I. Hardin Negus, [place], [place] Office number [phone] Pager [place] 09/03/2020, 4:38 PM

## 2020-09-03 NOTE — Progress Notes (Signed)
[facility] PROGRESS NOTE   Marcus Wright VQQ:[id number] DOB: Jul 20, 1956 DOA: 09/01/2020  PCP: Valerie Roys, DO  Brief History/Interval Summary: 64 y.o. male with medical history significant of history of sinus cancer in 2003, chronic mucoid otitis media, degenerative disc disease, dysphagia, ear mass, hearing loss, history of neck surgery, hypertension, hyperlipidemia, hypothyroidism who presented with dysphagia.  As above patient has a history of sinus cancer status post radiation therapy and chemo in 2003.  He has a long history of multiple ENT related issues since that time.  An evaluation in November patient had a CT scan done on the 22nd which showed this nasopharyngeal mass.  On the morning of 12/5 patient woke up in had a choking sensation.  And during his choking sensations he had some difficulty breathing.  After he got to the ED at [facility] his breathing had improved but he continued to have dysphagia and he was made n.p.o.   patient was subsequently transferred to this facility still be seen by his ENT consultant.    Reason for Visit: Nasopharyngeal tumor  Consultants: ENT, Dr. Fredric Dine  Procedures: None yet.  Antibiotics: Anti-infectives (From admission, onward)   Start     Dose/Rate Route Frequency Ordered Stop   09/03/20 0815  ceFAZolin (ANCEF) IVPB 2g/100 mL premix        2 g 200 mL/hr over 30 Minutes Intravenous On call to O.R. 09/03/20 4332 09/04/20 0559   09/02/20 0000  Ampicillin-Sulbactam (UNASYN) 3 g in sodium chloride 0.9 % 100 mL IVPB        3 g 200 mL/hr over 30 Minutes Intravenous Every 6 hours 09/01/20 2304        Subjective/Interval History: Patient hard of hearing.  Complains of some cough.  No chest pain.  No shortness of breath.  Denies any significant pain.    Assessment/Plan:  Nasopharyngeal mass with dysphagia Plan is for surgical intervention this afternoon.  Cancer is at the top of the differential.  Patient remains on Unasyn.   Patient was given steroids while he was in the emergency department at [facility] which is not continued.  Patient seems to be protecting his airway at this time.  Continue with n.p.o. status.  Essential hypertension Holding home medications.  Blood pressure is reasonably well controlled.  Continue to monitor.    Hypokalemia Will be repleted intravenously.  Check magnesium tomorrow.  History of hypothyroidism Holding Synthroid for now.  Normocytic anemia No evidence of overt bleeding.  Continue to monitor.  Slight drop in hemoglobin is likely dilutional.   DVT Prophylaxis: SCDs for now Code Status: Full code Family Communication: Discussed with the patient.  No family at bedside Disposition Plan: Hopefully return home when improved  Status is: Inpatient  Remains inpatient appropriate because:IV treatments appropriate due to intensity of illness or inability to take PO and Inpatient level of care appropriate due to severity of illness   Dispo:  Patient From: Home  Planned Disposition: Home  Expected discharge date: 09/05/20  Medically stable for discharge: No        Medications:  Scheduled: . sodium chloride flush  3 mL Intravenous Q12H   Continuous: . 0.45 % NaCl with KCl 20 mEq / L    . ampicillin-sulbactam (UNASYN) IV 3 g (09/03/20 0548)  .  ceFAZolin (ANCEF) IV    . lactated ringers 10 mL/hr at 09/03/20 0833   PRN:   Objective:  Vital Signs  Vitals:   09/02/20 1944 09/02/20 2354 09/03/20 0401  09/03/20 0924  BP: 104/64 105/74 101/70 114/68  Pulse: 60 (!) 55 65 (!) 57  Resp: 17 18 18 18   Temp: 97.7 F (36.5 C) 97.7 F (36.5 C) 97.7 F (36.5 C) 97.9 F (36.6 C)  TempSrc: Oral Oral Oral Oral  SpO2: 97% 98% 97% 97%  Weight:      Height:        Intake/Output Summary (Last 24 hours) at 09/03/2020 0948 Last data filed at 09/03/2020 0404 Gross per 24 hour  Intake 491.75 ml  Output 800 ml  Net -308.25 ml   Filed Weights   08/27/20 1302 09/01/20 2235   Weight: 77.1 kg 77 kg    General appearance: Awake alert.  In no distress.  Mildly distracted Resp: Clear to auscultation bilaterally.  Normal effort Cardio: S1-S2 is normal regular.  No S3-S4.  No rubs murmurs or bruit GI: Abdomen is soft.  Nontender nondistended.  Bowel sounds are present normal.  No masses organomegaly Extremities: No edema.   Neurologic: No focal neurological deficits.      Lab Results:  Data Reviewed: I have personally reviewed following labs and imaging studies  CBC: Recent Labs  Lab 08/31/20 0856 09/01/20 0506 09/02/20 0351 09/03/20 0106  WBC 7.0 5.5 11.3* 7.3  NEUTROABS  --  4.5  --   --   HGB 11.3* 11.2* 10.2* 9.8*  HCT 33.4* 33.3* 30.9* 29.9*  MCV 84.3 84.1 84.7 85.9  PLT 353 324 341 536    Basic Metabolic Panel: Recent Labs  Lab 08/31/20 0856 09/01/20 0506 09/02/20 0351 09/03/20 0106  NA 138  --  142 141  K 4.1  --  3.8 3.4*  CL 102  --  108 109  CO2 27  --  24 25  GLUCOSE 116*  --  119* 99  BUN 8  --  21 17  CREATININE 1.00 0.93 0.97 0.89  CALCIUM 9.4  --  8.7* 8.3*    GFR: Estimated Creatinine Clearance: 88.9 mL/min (by C-G formula based on SCr of 0.89 mg/dL).  Coagulation Profile: Recent Labs  Lab 09/02/20 0351  INR 1.2      Recent Results (from the past 240 hour(s))  SARS CORONAVIRUS 2 (TAT 6-24 HRS) Nasopharyngeal Nasopharyngeal Swab     Status: None   Collection Time: 08/28/20 11:30 AM   Specimen: Nasopharyngeal Swab  Result Value Ref Range Status   SARS Coronavirus 2 NEGATIVE NEGATIVE Final    Comment: (NOTE) SARS-CoV-2 target nucleic acids are NOT DETECTED.  The SARS-CoV-2 RNA is generally detectable in upper and lower respiratory specimens during the acute phase of infection. Negative results do not preclude SARS-CoV-2 infection, do not rule out co-infections with other pathogens, and should not be used as the sole basis for treatment or other patient management decisions. Negative results must be  combined with clinical observations, patient history, and epidemiological information. The expected result is Negative.  Fact Sheet for Patients: SugarRoll.be  Fact Sheet for Healthcare Providers: https://www.woods-mathews.com/  This test is not yet approved or cleared by the [place] FDA and  has been authorized for detection and/or diagnosis of SARS-CoV-2 by FDA under an Emergency Use Authorization (EUA). This EUA will remain  in effect (meaning this test can be used) for the duration of the COVID-19 declaration under Se ction 564(b)(1) of the Act, 21 U.S.C. section 360bbb-3(b)(1), unless the authorization is terminated or revoked sooner.  Performed at [facility], [place] [address], [place], [place] [address]   Resp Panel  by RT-PCR (Flu A&B, Covid) Nasopharyngeal Swab     Status: None   Collection Time: 08/31/20 10:06 AM   Specimen: Nasopharyngeal Swab; Nasopharyngeal(NP) swabs in vial transport medium  Result Value Ref Range Status   SARS Coronavirus 2 by RT PCR NEGATIVE NEGATIVE Final    Comment: (NOTE) SARS-CoV-2 target nucleic acids are NOT DETECTED.  The SARS-CoV-2 RNA is generally detectable in upper respiratory specimens during the acute phase of infection. The lowest concentration of SARS-CoV-2 viral copies this assay can detect is 138 copies/mL. A negative result does not preclude SARS-Cov-2 infection and should not be used as the sole basis for treatment or other patient management decisions. A negative result may occur with  improper specimen collection/handling, submission of specimen other than nasopharyngeal swab, presence of viral mutation(s) within the areas targeted by this assay, and inadequate number of viral copies(<138 copies/mL). A negative result must be combined with clinical observations, patient history, and epidemiological information. The expected result is Negative.  Fact Sheet for Patients:   EntrepreneurPulse.com.au  Fact Sheet for Healthcare Providers:  IncredibleEmployment.be  This test is no t yet approved or cleared by the [place] FDA and  has been authorized for detection and/or diagnosis of SARS-CoV-2 by FDA under an Emergency Use Authorization (EUA). This EUA will remain  in effect (meaning this test can be used) for the duration of the COVID-19 declaration under Section 564(b)(1) of the Act, 21 U.S.C.section 360bbb-3(b)(1), unless the authorization is terminated  or revoked sooner.       Influenza A by PCR NEGATIVE NEGATIVE Final   Influenza B by PCR NEGATIVE NEGATIVE Final    Comment: (NOTE) The Xpert Xpress SARS-CoV-2/FLU/RSV plus assay is intended as an aid in the diagnosis of influenza from Nasopharyngeal swab specimens and should not be used as a sole basis for treatment. Nasal washings and aspirates are unacceptable for Xpert Xpress SARS-CoV-2/FLU/RSV testing.  Fact Sheet for Patients: EntrepreneurPulse.com.au  Fact Sheet for Healthcare Providers: IncredibleEmployment.be  This test is not yet approved or cleared by the [place] FDA and has been authorized for detection and/or diagnosis of SARS-CoV-2 by FDA under an Emergency Use Authorization (EUA). This EUA will remain in effect (meaning this test can be used) for the duration of the COVID-19 declaration under Section 564(b)(1) of the Act, 21 U.S.C. section 360bbb-3(b)(1), unless the authorization is terminated or revoked.  Performed at [facility], [address] [place], [place] [address]   Aerobic/Anaerobic Culture (surgical/deep wound)     Status: None (Preliminary result)   Collection Time: 08/31/20  2:33 PM   Specimen: Nose; Tissue  Result Value Ref Range Status   Specimen Description   Final    NOSE Performed at [facility], [address], [place], [place] [address]    Special  Requests   Final    Normal Performed at [facility], [address] [place], [place] [address]    Gram Stain   Final    FEW WBC PRESENT, PREDOMINANTLY PMN FEW GRAM POSITIVE COCCI IN CLUSTERS    Culture   Final    CULTURE REINCUBATED FOR BETTER GROWTH HOLDING FOR POSSIBLE ANAEROBE Performed at [facility], [place] [address], [place],  [address]    Report Status PENDING  Incomplete      Radiology Studies: No results found.     LOS: 1 day   Ruble Pumphrey [employer] on [email or web address]  09/03/2020, 9:48 AM

## 2020-09-03 NOTE — Transfer of Care (Signed)
Immediate Anesthesia Transfer of Care Note  Patient: Marcus Wright  Procedure(s) Performed: Removal of Right nasopharyngeal mass, Biopsy and frozen sections (Right Nose)  Patient Location: PACU  Anesthesia Type:General  Level of Consciousness: awake and patient cooperative  Airway & Oxygen Therapy: Patient Spontanous Breathing and Patient connected to face mask oxygen  Post-op Assessment: Report given to RN and Post -op Vital signs reviewed and stable  Post vital signs: Reviewed and stable  Last Vitals:  Vitals Value Taken Time  BP 144/85 09/03/20 1720  Temp 36.6 C 09/03/20 1720  Pulse 89 09/03/20 1724  Resp 14 09/03/20 1724  SpO2 98 % 09/03/20 1724  Vitals shown include unvalidated device data.  Last Pain:  Vitals:   09/03/20 0924  TempSrc: Oral  PainSc: 0-No pain         Complications: No complications documented.

## 2020-09-03 NOTE — Anesthesia Procedure Notes (Signed)
Procedure Name: Intubation Date/Time: 09/03/2020 3:45 PM Performed by: Renato Shin, CRNA Pre-anesthesia Checklist: Patient identified, Emergency Drugs available, Suction available and Patient being monitored Patient Re-evaluated:Patient Re-evaluated prior to induction Oxygen Delivery Method: Circle system utilized Preoxygenation: Pre-oxygenation with 100% oxygen Induction Type: IV induction Ventilation: Mask ventilation without difficulty and Oral airway inserted - appropriate to patient size Laryngoscope Size: Miller and 3 Grade View: Grade I Tube type: Oral Tube size: 7.5 mm Number of attempts: 1 Airway Equipment and Method: Stylet and Oral airway Placement Confirmation: ETT inserted through vocal cords under direct vision,  positive ETCO2 and breath sounds checked- equal and bilateral Secured at: 22 cm Tube secured with: Tape Dental Injury: Teeth and Oropharynx as per pre-operative assessment

## 2020-09-03 NOTE — Progress Notes (Signed)
HOSPITAL MEDICINE OVERNIGHT EVENT NOTE    Notified by nursing that telemetry has alerted him to 2 separate episodes of sinus pauses.  Patient experienced a 2.3-second pause at 8:15 PM followed by a 2.93-second pause at 8:46 PM.  Neither episode was associated with any symptoms.  Chart reviewed, I am wondering if these pauses may somehow be related to chest infiltration around the right internal carotid artery.  Regardless, patient is asymptomatic from these episodes and therefore intervention is likely not necessary at this time.  We will continue to monitor closely on telemetry monitor for any symptoms.  Telemetry strips can be reviewed with cardiology in the morning if needed.  Vernelle Emerald  MD [facility]

## 2020-09-03 NOTE — Anesthesia Preprocedure Evaluation (Signed)
Anesthesia Evaluation  Patient identified by MRN, date of birth, ID band Patient awake    Reviewed: Allergy & Precautions, NPO status , Patient's Chart, lab work & pertinent test results  Airway Mallampati: II  TM Distance: >3 FB Neck ROM: Full    Dental  (+) Dental Advisory Given   Pulmonary former smoker,    breath sounds clear to auscultation       Cardiovascular hypertension, Pt. on medications  Rhythm:Regular Rate:Normal     Neuro/Psych  Headaches,    GI/Hepatic negative GI ROS, Neg liver ROS,   Endo/Other  Hypothyroidism   Renal/GU negative Renal ROS     Musculoskeletal  (+) Arthritis ,   Abdominal   Peds  Hematology  (+) anemia ,   Anesthesia Other Findings   Reproductive/Obstetrics                             Anesthesia Physical Anesthesia Plan  ASA: II  Anesthesia Plan: General   Post-op Pain Management:    Induction:   PONV Risk Score and Plan: 2 and Dexamethasone, Ondansetron and Treatment may vary due to age or medical condition  Airway Management Planned: Oral ETT  Additional Equipment:   Intra-op Plan:   Post-operative Plan: Extubation in OR  Informed Consent: I have reviewed the patients History and Physical, chart, labs and discussed the procedure including the risks, benefits and alternatives for the proposed anesthesia with the patient or authorized representative who has indicated his/her understanding and acceptance.     Dental advisory given  Plan Discussed with: CRNA  Anesthesia Plan Comments:         Anesthesia Quick Evaluation

## 2020-09-03 NOTE — Progress Notes (Signed)
SLP Cancellation Note  Patient Details Name: Marcus Wright MRN: [id number] DOB: October 24, 1955   Cancelled treatment:       Reason Eval/Treat Not Completed: Other (comment) (Pt is currently NPO for nasal endoscopy with biopsy scheduled at 1330. SLP will f/u)  Tobie Poet I. Hardin Negus, [place], [place] Office number [phone] Pager [place] 09/03/2020, 10:38 AM

## 2020-09-04 ENCOUNTER — Encounter (HOSPITAL_COMMUNITY): Payer: Self-pay | Admitting: Otolaryngology

## 2020-09-04 LAB — COMPREHENSIVE METABOLIC PANEL
ALT: 27 U/L (ref 0–44)
AST: 27 U/L (ref 15–41)
Albumin: 3.7 g/dL (ref 3.5–5.0)
Alkaline Phosphatase: 60 U/L (ref 38–126)
Anion gap: 12 (ref 5–15)
BUN: 13 mg/dL (ref 8–23)
CO2: 24 mmol/L (ref 22–32)
Calcium: 8.8 mg/dL — ABNORMAL LOW (ref 8.9–10.3)
Chloride: 102 mmol/L (ref 98–111)
Creatinine, Ser: 0.88 mg/dL (ref 0.61–1.24)
GFR, Estimated: >60 mL/min (ref >60)
Glucose, Bld: 138 mg/dL — ABNORMAL HIGH (ref 70–99)
Potassium: 4.3 mmol/L (ref 3.5–5.1)
Sodium: 138 mmol/L (ref 135–145)
Total Bilirubin: 0.7 mg/dL (ref 0.3–1.2)
Total Protein: 6.6 g/dL (ref 6.5–8.1)

## 2020-09-04 LAB — CBC
HCT: 34.1 % — ABNORMAL LOW (ref 39.0–52.0)
Hemoglobin: 11.2 g/dL — ABNORMAL LOW (ref 13.0–17.0)
MCH: 27.7 pg (ref 26.0–34.0)
MCHC: 32.8 g/dL (ref 30.0–36.0)
MCV: 84.4 fL (ref 80.0–100.0)
Platelets: 296 10*3/uL (ref 150–400)
RBC: 4.04 MIL/uL — ABNORMAL LOW (ref 4.22–5.81)
RDW: 13 % (ref 11.5–15.5)
WBC: 5.4 10*3/uL (ref 4.0–10.5)
nRBC: 0 % (ref 0.0–0.2)

## 2020-09-04 LAB — MAGNESIUM: Magnesium: 2.6 mg/dL — ABNORMAL HIGH (ref 1.7–2.4)

## 2020-09-04 MED ORDER — DEXAMETHASONE SODIUM PHOSPHATE 10 MG/ML IJ SOLN
8.0000 mg | Freq: Once | INTRAMUSCULAR | Status: AC
  Administered 2020-09-04: 8 mg via INTRAVENOUS
  Filled 2020-09-04: qty 1

## 2020-09-04 MED ORDER — DOXYCYCLINE HYCLATE 100 MG PO TABS
100.0000 mg | ORAL_TABLET | Freq: Two times a day (BID) | ORAL | Status: DC
  Administered 2020-09-04 – 2020-09-05 (×2): 100 mg via ORAL
  Filled 2020-09-04 (×2): qty 1

## 2020-09-04 NOTE — Anesthesia Postprocedure Evaluation (Signed)
Anesthesia Post Note  Patient: Marcus Wright  Procedure(s) Performed: Removal of Right nasopharyngeal mass, Biopsy and frozen sections (Right Nose)     Patient location during evaluation: PACU Anesthesia Type: General Level of consciousness: awake and alert Pain management: pain level controlled Vital Signs Assessment: post-procedure vital signs reviewed and stable Respiratory status: spontaneous breathing, nonlabored ventilation and respiratory function stable Cardiovascular status: blood pressure returned to baseline and stable Postop Assessment: no apparent nausea or vomiting Anesthetic complications: no   No complications documented.  Last Vitals:  Vitals:   09/04/20 0823 09/04/20 1205  BP: 102/73 120/72  Pulse: 67 62  Resp: 18 18  Temp: (!) 36.3 C (!) 36.2 C  SpO2: 100% 99%    Last Pain:  Vitals:   09/04/20 1205  TempSrc: Oral  PainSc:                  [name]

## 2020-09-04 NOTE — Evaluation (Signed)
Clinical/Bedside Swallow Evaluation Patient Details  Name: Marcus Wright MRN: [id number] Date of Birth: 07-Oct-1955  Today's Date: 09/04/2020 Time: SLP Start Time (ACUTE ONLY): 1059 SLP Stop Time (ACUTE ONLY): 1110 SLP Time Calculation (min) (ACUTE ONLY): 11 min  Past Medical History:  Past Medical History:  Diagnosis Date  . Cancer [facility]) 2003   skin'  . Hyperlipidemia   . Hypertension   . Hypothyroidism   . Thyroid disorder   . Wears dentures    full upper  . Wears hearing aid    Past Surgical History:  Past Surgical History:  Procedure Laterality Date  . BACK SURGERY  2011  . BREAST SURGERY Right 11-22-12  . CHOLECYSTECTOMY    . COLONOSCOPY    . COLONOSCOPY WITH PROPOFOL N/A 07/19/2016   Procedure: COLONOSCOPY WITH PROPOFOL;  Surgeon: Lucilla Lame, MD;  Location: [place];  Service: Endoscopy;  Laterality: N/A;  . REPLACEMENT TOTAL KNEE Right 1993  . SINUS ENDO W/FUSION Right 09/03/2020   Procedure: Removal of Right nasopharyngeal mass, Biopsy and frozen sections;  Surgeon: Jason Coop, DO;  Location: [place];  Service: ENT;  Laterality: Right;  . SINUS SURGERY WITH INSTATRAK  2003   cancer   HPI:  64 y.o. male with medical history significant of history of sinus cancer in 2003 (s/p chemo, XRT), chronic mucoid otitis media, degenerative disc disease, dysphagia, ear mass, hearing loss, history of neck surgery, hypertension, hyperlipidemia, hypothyroidism who presented with dysphagia. Findings per nasal endoscopy ENT report:  "No obvious exophytic mass lesion, inflammatory mucosa of the right nasopharynx, obliteration of the right eustachian tube orifice, scant purulent drainage noted, cultured. Mass consistent which appeared to be a fungal ball was removed from the right nasopharynx"   Assessment / Plan / Recommendation Clinical Impression  Pt presents with a suspected mild oropharyngeal dysphagia. Pt with hypernasality, likely velopharyngeal insufficiency,  suspected from prior nasal cancer and XRT 2003. Pt also very HOH. Pt states at baseline consuming softer solids and thin liquids. He reports if he isn't able to chew foods well he feels like foods stick in the back of his throat (likely hypopharyngeal region). He self reports using thin liquid alternation, small bites and sips to mitigate symptoms. Laryngeal elevation per palpation appeared reduced. Vocal quality remained clear throughout PO trials. No overt s/sx of aspiration. Recommend dysphagia 3 (mechanical soft) and thin liquids. Crush larger PO meds as needed. SLP to follow up for diet tolerance.   SLP Visit Diagnosis: Dysphagia, unspecified (R13.10)    Aspiration Risk  Mild aspiration risk    Diet Recommendation   Dysphagia 3 (mechanical soft) thin liquids  Medication Administration: Other (Comment) (as tolerated, crush larger medicines)    Other  Recommendations Oral Care Recommendations: Oral care BID   Follow up Recommendations        Frequency and Duration min 1 x/week  1 week       Prognosis Prognosis for Safe Diet Advancement: Good      Swallow Study   General Date of Onset: 09/01/20 HPI: 63 y.o. male with medical history significant of history of sinus cancer in 2003 (s/p chemo, XRT), chronic mucoid otitis media, degenerative disc disease, dysphagia, ear mass, hearing loss, history of neck surgery, hypertension, hyperlipidemia, hypothyroidism who presented with dysphagia. Findings per nasal endoscopy "No obvious exophytic mass lesion, inflammatory mucosa of the right nasopharynx, obliteration of the right eustachian tube orifice, scant purulent drainage noted, cultured. Mass consistent which appeared to be a fungal ball was  removed from the right nasopharynx" Type of Study: Bedside Swallow Evaluation Previous Swallow Assessment: none on file Diet Prior to this Study: NPO Temperature Spikes Noted: No Respiratory Status: Room air History of Recent Intubation: Yes Length  of Intubations (days): 0 days (less than 1) Date extubated: 09/03/20 Behavior/Cognition: Alert;Cooperative;Pleasant mood Oral Care Completed by SLP: Yes Oral Cavity - Dentition: Dentures, top;Missing dentition Vision: Functional for self-feeding Self-Feeding Abilities: Able to feed self Patient Positioning: Upright in bed Baseline Vocal Quality: Other (comment) (hyernasality, impaired resonance) Volitional Swallow: Able to elicit    Oral/Motor/Sensory Function Overall Oral Motor/Sensory Function: Moderate impairment (suspected velopharyngeal insufficiency 2/2 XRT) Facial ROM: Within Functional Limits Facial Symmetry: Within Functional Limits Facial Strength: Within Functional Limits Facial Sensation: Within Functional Limits Lingual ROM: Within Functional Limits Lingual Symmetry: Within Functional Limits Lingual Strength: Within Functional Limits Lingual Sensation: Within Functional Limits Velum: Suspected CN X (Vagus) dysfunction;Impaired right Mandible: Within Functional Limits   Ice Chips Ice chips: Impaired Presentation: Spoon Oral Phase Functional Implications: Oral residue;Other (comment) (pt with some oral expectoration) Pharyngeal Phase Impairments: Suspected delayed Swallow;Multiple swallows;Decreased hyoid-laryngeal movement   Thin Liquid Thin Liquid: Impaired Presentation: Cup;Straw Pharyngeal  Phase Impairments: Suspected delayed Swallow;Multiple swallows    Nectar Thick Nectar Thick Liquid: Not tested   Honey Thick Honey Thick Liquid: Not tested   Puree Puree: Impaired Presentation: Self Fed Pharyngeal Phase Impairments: Suspected delayed Swallow;Multiple swallows;Decreased hyoid-laryngeal movement   Solid     Solid: Impaired Presentation: Self Fed Oral Phase Impairments: Impaired mastication Oral Phase Functional Implications: Prolonged oral transit Pharyngeal Phase Impairments: Suspected delayed Swallow;Multiple swallows;Decreased hyoid-laryngeal movement       Nile Dorning E Morganne Haile MA, CCC-SLP Acute Rehabilitation Services  09/04/2020,12:07 PM

## 2020-09-04 NOTE — Progress Notes (Signed)
[facility] PROGRESS NOTE   Marcus Wright OYD:[id number] DOB: 02-01-1956 DOA: 09/01/2020  PCP: Valerie Roys, DO  Brief History/Interval Summary: 64 y.o. male with medical history significant of history of sinus cancer in 2003, chronic mucoid otitis media, degenerative disc disease, dysphagia, ear mass, hearing loss, history of neck surgery, hypertension, hyperlipidemia, hypothyroidism who presented with dysphagia.  As above patient has a history of sinus cancer status post radiation therapy and chemo in 2003.  He has a long history of multiple ENT related issues since that time.  An evaluation in November patient had a CT scan done on the 22nd which showed this nasopharyngeal mass.  On the morning of 12/5 patient woke up in had a choking sensation.  And during his choking sensations he had some difficulty breathing.  After he got to the ED at [facility] his breathing had improved but he continued to have dysphagia and he was made n.p.o.   patient was subsequently transferred to this facility still be seen by his ENT consultant.    Reason for Visit: Nasopharyngeal mass  Consultants: ENT, Dr. Fredric Dine  Procedures: None yet.  Antibiotics: Anti-infectives (From admission, onward)   Start     Dose/Rate Route Frequency Ordered Stop   09/03/20 1300  ceFAZolin (ANCEF) IVPB 2g/100 mL premix        2 g 200 mL/hr over 30 Minutes Intravenous On call to O.R. 09/03/20 6720 09/03/20 1617   09/02/20 0000  Ampicillin-Sulbactam (UNASYN) 3 g in sodium chloride 0.9 % 100 mL IVPB        3 g 200 mL/hr over 30 Minutes Intravenous Every 6 hours 09/01/20 2304        Subjective/Interval History: Patient very hard of hearing.  However does not appear to be in any discomfort.  Denies any pain.  No nausea vomiting.  No shortness of breath.     Assessment/Plan:  Nasopharyngeal mass with dysphagia Patient seen by ENT.  Underwent biopsy of this mass yesterday.  According to the operative notes there  was concern for fungal ball.  Sent for further analysis and pathology microbiology.  Patient remains on Unasyn.  Patient was given steroids at [facility] which was not continued.  Seems to be protecting his airway well.  Speech therapy to reevaluate now that he has undergone biopsy.   Essential hypertension Holding home medications.  Blood pressure remains reasonably well controlled.  Hypokalemia Repleted.  Magnesium 2.6.    History of hypothyroidism Holding Synthroid for now.  Normocytic anemia No evidence of overt bleeding.  Continue to monitor.  Slight drop in hemoglobin is likely dilutional.   DVT Prophylaxis: SCDs for now Code Status: Full code Family Communication: Discussed with the patient.  No family at bedside Disposition Plan: Hopefully return home when improved.  PT and OT evaluation.  Status is: Inpatient  Remains inpatient appropriate because:IV treatments appropriate due to intensity of illness or inability to take PO and Inpatient level of care appropriate due to severity of illness   Dispo:  Patient From: Home  Planned Disposition: Home  Expected discharge date: 09/07/2020  Medically stable for discharge: No        Medications:  Scheduled: . chlorhexidine gluconate (MEDLINE KIT)  15 mL Mouth/Throat NOW  . sodium chloride flush  3 mL Intravenous Q12H   Continuous: . 0.45 % NaCl with KCl 20 mEq / L 75 mL/hr at 09/04/20 0600  . ampicillin-sulbactam (UNASYN) IV 3 g (09/04/20 0622)  . lactated ringers  PRN:   Objective:  Vital Signs  Vitals:   09/04/20 0013 09/04/20 0400 09/04/20 0500 09/04/20 0823  BP: 108/70 101/70  102/73  Pulse: 63 63  67  Resp: _0 Temp: 97.7 F (36.5 C) 97.6 F (36.4 C)  (!) 97.4 F (36.3 C)  TempSrc: Oral Oral  Oral  SpO2: 99% 98%  100%  Weight:   73.8 kg   Height:        Intake/Output Summary (Last 24 hours) at 09/04/2020 1030 Last data filed at 09/04/2020 0600 Gross per 24 hour  Intake 2249.25 ml  Output  950 ml  Net 1299.25 ml   Filed Weights   09/01/20 2235 09/03/20 1359 09/04/20 0500  Weight: 77 kg 77 kg 73.8 kg    General appearance: Awake alert.  In no distress Resp: Clear to auscultation bilaterally.  Normal effort Cardio: S1-S2 is normal regular.  No S3-S4.  No rubs murmurs or bruit GI: Abdomen is soft.  Nontender nondistended.  Bowel sounds are present normal.  No masses organomegaly Extremities: No edema.  Moving all his extremities Neurologic:   No focal neurological deficits.       Lab Results:  Data Reviewed: I have personally reviewed following labs and imaging studies  CBC: Recent Labs  Lab 08/31/20 0856 09/01/20 0506 09/02/20 0351 09/03/20 0106 09/04/20 0400  WBC 7.0 5.5 11.3* 7.3 5.4  NEUTROABS  --  4.5  --   --   --   HGB 11.3* 11.2* 10.2* 9.8* 11.2*  HCT 33.4* 33.3* 30.9* 29.9* 34.1*  MCV 84.3 84.1 84.7 85.9 84.4  PLT 353 324 341 306 735    Basic Metabolic Panel: Recent Labs  Lab 08/31/20 0856 09/01/20 0506 09/02/20 0351 09/03/20 0106 09/04/20 0400  NA 138  --  142 141 138  K 4.1  --  3.8 3.4* 4.3  CL 102  --  108 109 102  CO2 27  --  _1 GLUCOSE 116*  --  119* 99 138*  BUN 8  --  _2 CREATININE 1.00 0.93 0.97 0.89 0.88  CALCIUM 9.4  --  8.7* 8.3* 8.8*  MG  --   --   --   --  2.6*    GFR: Estimated Creatinine Clearance: 86.2 mL/min (by C-G formula based on SCr of 0.88 mg/dL).  Coagulation Profile: Recent Labs  Lab 09/02/20 0351  INR 1.2      Recent Results (from the past 240 hour(s))  SARS CORONAVIRUS 2 (TAT 6-24 HRS) Nasopharyngeal Nasopharyngeal Swab     Status: None   Collection Time: 08/28/20 11:30 AM   Specimen: Nasopharyngeal Swab  Result Value Ref Range Status   SARS Coronavirus 2 NEGATIVE NEGATIVE Final    Comment: (NOTE) SARS-CoV-2 target nucleic acids are NOT DETECTED.  The SARS-CoV-2 RNA is generally detectable in upper and lower respiratory specimens during the acute phase of infection.  Negative results do not preclude SARS-CoV-2 infection, do not rule out co-infections with other pathogens, and should not be used as the sole basis for treatment or other patient management decisions. Negative results must be combined with clinical observations, patient history, and epidemiological information. The expected result is Negative.  Fact Sheet for Patients: SugarRoll.be  Fact Sheet for Healthcare Providers: https://www.woods-mathews.com/  This test is not yet approved or cleared by the [place] FDA and  has been authorized for detection and/or diagnosis of SARS-CoV-2 by FDA under an Emergency Use Authorization (EUA). This  EUA will remain  in effect (meaning this test can be used) for the duration of the COVID-19 declaration under Se ction 564(b)(1) of the Act, 21 U.S.C. section 360bbb-3(b)(1), unless the authorization is terminated or revoked sooner.  Performed at [facility], [place] [address] [place], [place] [address]   Resp Panel by RT-PCR (Flu A&B, Covid) Nasopharyngeal Swab     Status: None   Collection Time: 08/31/20 10:06 AM   Specimen: Nasopharyngeal Swab; Nasopharyngeal(NP) swabs in vial transport medium  Result Value Ref Range Status   SARS Coronavirus 2 by RT PCR NEGATIVE NEGATIVE Final    Comment: (NOTE) SARS-CoV-2 target nucleic acids are NOT DETECTED.  The SARS-CoV-2 RNA is generally detectable in upper respiratory specimens during the acute phase of infection. The lowest concentration of SARS-CoV-2 viral copies this assay can detect is 138 copies/mL. A negative result does not preclude SARS-Cov-2 infection and should not be used as the sole basis for treatment or other patient management decisions. A negative result may occur with  improper specimen collection/handling, submission of specimen other than nasopharyngeal swab, presence of viral mutation(s) within the areas targeted by this assay, and  inadequate number of viral copies(<138 copies/mL). A negative result must be combined with clinical observations, patient history, and epidemiological information. The expected result is Negative.  Fact Sheet for Patients:  EntrepreneurPulse.com.au  Fact Sheet for Healthcare Providers:  IncredibleEmployment.be  This test is no t yet approved or cleared by the [place] FDA and  has been authorized for detection and/or diagnosis of SARS-CoV-2 by FDA under an Emergency Use Authorization (EUA). This EUA will remain  in effect (meaning this test can be used) for the duration of the COVID-19 declaration under Section 564(b)(1) of the Act, 21 U.S.C.section 360bbb-3(b)(1), unless the authorization is terminated  or revoked sooner.       Influenza A by PCR NEGATIVE NEGATIVE Final   Influenza B by PCR NEGATIVE NEGATIVE Final    Comment: (NOTE) The Xpert Xpress SARS-CoV-2/FLU/RSV plus assay is intended as an aid in the diagnosis of influenza from Nasopharyngeal swab specimens and should not be used as a sole basis for treatment. Nasal washings and aspirates are unacceptable for Xpert Xpress SARS-CoV-2/FLU/RSV testing.  Fact Sheet for Patients: EntrepreneurPulse.com.au  Fact Sheet for Healthcare Providers: IncredibleEmployment.be  This test is not yet approved or cleared by the [place] FDA and has been authorized for detection and/or diagnosis of SARS-CoV-2 by FDA under an Emergency Use Authorization (EUA). This EUA will remain in effect (meaning this test can be used) for the duration of the COVID-19 declaration under Section 564(b)(1) of the Act, 21 U.S.C. section 360bbb-3(b)(1), unless the authorization is terminated or revoked.  Performed at [facility], [address] [place], [place] [address]   Aerobic/Anaerobic Culture (surgical/deep wound)     Status: None (Preliminary result)    Collection Time: 08/31/20  2:33 PM   Specimen: Nose; Tissue  Result Value Ref Range Status   Specimen Description   Final    NOSE Performed at [facility], [address] [place], [place] [address]    Special Requests   Final    Normal Performed at [facility], [address] [place], [place] [address]    Gram Stain   Final    FEW WBC PRESENT, PREDOMINANTLY PMN FEW GRAM POSITIVE COCCI IN CLUSTERS    Culture   Final    RARE STAPHYLOCOCCUS AUREUS WITHIN MIXED ORGANISMS NO ANAEROBES ISOLATED Performed at  [facility], [place] [address] [place], [place] [address]    Report Status PENDING  Incomplete  Aerobic/Anaerobic Culture (surgical/deep wound)     Status: None (Preliminary result)   Collection Time: 09/03/20  4:48 PM   Specimen: PATH Other; ENT  Result Value Ref Range Status   Specimen Description WOUND RIGHT NASOPHARYNGEAL MASS  Final   Special Requests PATIENT ON FOLLOWING ANCEF  Final   Gram Stain   Final    RARE WBC PRESENT, PREDOMINANTLY MONONUCLEAR NO ORGANISMS SEEN Performed at [facility], [place] [address] [place],  [address]    Culture PENDING  Incomplete   Report Status PENDING  Incomplete      Radiology Studies: No results found.     LOS: 2 days   [facility] Pager on [email or web address]  09/04/2020, 10:30 AM

## 2020-09-05 DIAGNOSIS — J392 Other diseases of pharynx: Secondary | ICD-10-CM

## 2020-09-05 LAB — COMPREHENSIVE METABOLIC PANEL
ALT: 31 U/L (ref 0–44)
AST: 25 U/L (ref 15–41)
Albumin: 3.3 g/dL — ABNORMAL LOW (ref 3.5–5.0)
Alkaline Phosphatase: 52 U/L (ref 38–126)
Anion gap: 9 (ref 5–15)
BUN: 19 mg/dL (ref 8–23)
CO2: 23 mmol/L (ref 22–32)
Calcium: 8.4 mg/dL — ABNORMAL LOW (ref 8.9–10.3)
Chloride: 105 mmol/L (ref 98–111)
Creatinine, Ser: 0.89 mg/dL (ref 0.61–1.24)
GFR, Estimated: >60 mL/min (ref >60)
Glucose, Bld: 134 mg/dL — ABNORMAL HIGH (ref 70–99)
Potassium: 4.1 mmol/L (ref 3.5–5.1)
Sodium: 137 mmol/L (ref 135–145)
Total Bilirubin: 0.6 mg/dL (ref 0.3–1.2)
Total Protein: 6.1 g/dL — ABNORMAL LOW (ref 6.5–8.1)

## 2020-09-05 LAB — AEROBIC/ANAEROBIC CULTURE W GRAM STAIN (SURGICAL/DEEP WOUND): Special Requests: NORMAL

## 2020-09-05 LAB — CBC
HCT: 33.7 % — ABNORMAL LOW (ref 39.0–52.0)
Hemoglobin: 11.2 g/dL — ABNORMAL LOW (ref 13.0–17.0)
MCH: 28.1 pg (ref 26.0–34.0)
MCHC: 33.2 g/dL (ref 30.0–36.0)
MCV: 84.7 fL (ref 80.0–100.0)
Platelets: 318 10*3/uL (ref 150–400)
RBC: 3.98 MIL/uL — ABNORMAL LOW (ref 4.22–5.81)
RDW: 12.8 % (ref 11.5–15.5)
WBC: 6.8 10*3/uL (ref 4.0–10.5)
nRBC: 0 % (ref 0.0–0.2)

## 2020-09-05 MED ORDER — DOXYCYCLINE HYCLATE 100 MG PO TABS: 100.0000 mg | ORAL_TABLET | Freq: Two times a day (BID) | ORAL | 0 refills | Status: AC

## 2020-09-05 NOTE — Progress Notes (Signed)
Pt was discharged home. Education was completed. Pt belongings taken with patient. Pt being transport home by his son [place].

## 2020-09-05 NOTE — Evaluation (Signed)
Physical Therapy Evaluation Patient Details Name: Marcus Wright MRN: [id number] DOB: 1955/12/04 Today's Date: 09/05/2020   History of Present Illness  64 y.o. male with medical history significant of history of sinus cancer in 2003, chronic mucoid otitis media, degenerative disc disease, dysphagia, ear mass, hearing loss, history of neck surgery, hypertension, hyperlipidemia, hypothyroidism who presented with dysphagia. 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  Clinical Impression  Pt presents to PT with mild deficits in LE strength, gait, and balance, although he is likely close to his baseline per pt report. Pt is able to ambulate household distances without physical assistance, with gait quality improving with progressive mobility. Pt expresses no concerns about his mobility at this time. Pt performs multiple sets of 2 stairs rather than consecutive flight due to limitations from IV. Pt will continue to benefit from aggressive mobilization during this admission. Pt has no PT or DME needs at the time of discharge.    Follow Up Recommendations No PT follow up    Equipment Recommendations  None recommended by PT    Recommendations for Other Services       Precautions / Restrictions Precautions Precautions: Fall Precaution Comments: very HOH Restrictions Weight Bearing Restrictions: No      Mobility  Bed Mobility Overal bed mobility: Independent                  Transfers Overall transfer level: Needs assistance Equipment used: None Transfers: Sit to/from Stand Sit to Stand: Supervision Stand pivot transfers: Min guard       General transfer comment: Supervision to stand from EOB positioned in lowest setting and Min guard for stand-pivot transfers without AD. Lateral LOB requiring Min guard to correct.  Ambulation/Gait Ambulation/Gait assistance: Supervision Gait Distance (Feet): 200 Feet Assistive device: None Gait  Pattern/deviations: Step-through pattern Gait velocity: functional Gait velocity interpretation: 1.31 - 2.62 ft/sec, indicative of limited community ambulator General Gait Details: slight increase in lateral sway initially, improves with increased ambulation distances  Stairs Stairs: Yes Stairs assistance: Supervision Stair Management: One rail Right;Step to pattern Number of Stairs: 2 General stair comments: 2 steps x4 consecutive sets  Wheelchair Mobility    Modified Rankin (Stroke Patients Only)       Balance Overall balance assessment: Needs assistance Sitting-balance support: No upper extremity supported;Feet supported Sitting balance-Leahy Scale: Good Sitting balance - Comments: Patient able to doff/don footwear seated EOB in figure-4 position.   Standing balance support: No upper extremity supported;During functional activity Standing balance-Leahy Scale: Good Standing balance comment: Lateral LOB with turning requiring Min guard to correct. Would benefit from AD during mobility to decrease risk of falls.                             Pertinent Vitals/Pain Pain Assessment: No/denies pain    Home Living Family/patient expects to be discharged to:: Private residence Living Arrangements: Alone Available Help at Discharge: Family;Available PRN/intermittently Type of Home: House Home Access: Stairs to enter Entrance Stairs-Rails:  (pt reports partial concrete wall, also posts to hold onto) Entrance Stairs-Number of Steps: 12 Home Layout: One level Home Equipment: Walker - 2 wheels;Cane - single point      Prior Function Level of Independence: Independent         Comments: Independent with ADLs/IADLs. Patient was still driving.     Hand Dominance   Dominant Hand: Right    Extremity/Trunk Assessment   Upper Extremity Assessment  Upper Extremity Assessment: Overall WFL for tasks assessed    Lower Extremity Assessment Lower Extremity Assessment:  Overall WFL for tasks assessed    Cervical / Trunk Assessment Cervical / Trunk Assessment: Normal  Communication   Communication: HOH (dysarthria)  Cognition Arousal/Alertness: Awake/alert Behavior During Therapy: WFL for tasks assessed/performed Overall Cognitive Status: Within Functional Limits for tasks assessed Area of Impairment: Safety/judgement                         Safety/Judgement: Decreased awareness of safety            General Comments General comments (skin integrity, edema, etc.): VSS on RA    Exercises     Assessment/Plan    PT Assessment Patient needs continued PT services  PT Problem List Decreased balance;Decreased mobility;Decreased activity tolerance;Decreased strength       PT Treatment Interventions Gait training;Stair training;Balance training;Neuromuscular re-education;Patient/family education    PT Goals (Current goals can be found in the Care Plan section)  Acute Rehab PT Goals Patient Stated Goal: to go home PT Goal Formulation: With patient Time For Goal Achievement: 09/19/20 Potential to Achieve Goals: Good Additional Goals Additional Goal #1: Pt will score >19/24 on DGI to indicate a reduced risk for falls    Frequency Min 3X/week   Barriers to discharge        Co-evaluation               AM-PAC PT "6 Clicks" Mobility  Outcome Measure Help needed turning from your back to your side while in a flat bed without using bedrails?: None Help needed moving from lying on your back to sitting on the side of a flat bed without using bedrails?: None Help needed moving to and from a bed to a chair (including a wheelchair)?: A Little Help needed standing up from a chair using your arms (e.g., wheelchair or bedside chair)?: A Little Help needed to walk in hospital room?: A Little Help needed climbing 3-5 steps with a railing? : A Little 6 Click Score: 20    End of Session   Activity Tolerance: Patient tolerated treatment  well Patient left: in chair;with call bell/phone within reach;with chair alarm set Nurse Communication: Mobility status PT Visit Diagnosis: Unsteadiness on feet (R26.81)    Time: 2202-5427 PT Time Calculation (min) (ACUTE ONLY): 28 min   Charges:   PT Evaluation $PT Eval Low Complexity: 1 Low PT Treatments $Gait Training: 8-22 mins        Zenaida Niece, PT, DPT Acute Rehabilitation Pager: [phone]   Zenaida Niece 09/05/2020, 12:20 PM

## 2020-09-05 NOTE — Discharge Instructions (Signed)
Dysphagia Eating Plan, Bite Size Food This diet plan is for people with moderate swallowing problems who have transitioned from pureed and minced foods. Bite size foods are soft and cut into small chunks so that they can be swallowed safely. On this eating plan, you may be instructed to drink liquids that are thickened. Work with your health care provider and your diet and nutrition specialist (dietitian) to make sure that you are following the diet safely and getting all the nutrients you need. What are tips for following this plan? General guidelines for foods   You may eat foods that are tender, soft, and moist.  Always test food texture before taking a bite. Poke food with a fork or spoon to make sure it is tender.  Food should be easy to cut and shew. Avoid large pieces of food that require a lot of chewing.  Take small bites. Each bite should be smaller than your thumb nail (about 15mm by 15 mm).  If you were on pureed and minced food diet plans, you may eat any of the foods included in those diets.  Avoid foods that are very dry, hard, sticky, chewy, coarse, or crunchy.  If instructed by your health care provider, thicken liquids. Follow your health care provider's instructions for what products to use, how to do this, and to what thickness. ? Your health care provider may recommend using a commercial thickener, rice cereal, or potato flakes. Ask your health care provider to recommend thickeners. ? Thickened liquids are usually a "pudding-like" consistency, or they may be as thick as honey or thick enough to eat with a spoon. Cooking  To moisten foods, you may add liquids while you are blending, mashing, or grinding your foods to the right consistency. These liquids include gravies, sauces, vegetable or fruit juice, milk, half and half, or water.  Strain extra liquid from foods before eating.  Reheat foods slowly to prevent a tough crust from forming.  Prepare foods in advance.  Meal planning  Eat a variety of foods to get all the nutrients you need.  Some foods may be tolerated better than others. Work with your dietitian to identify which foods are safest for you to eat.  Follow your meal plan as told by your dietitian. What foods are allowed? Grains Moist breads without nuts or seeds. Biscuits, muffins, pancakes, and waffles that are well-moistened with syrup, jelly, margarine, or butter. Cooked cereals. Moist bread stuffing. Moist rice. Well-moistened cold cereal with small chunks. Well-cooked pasta, noodles, rice, and bread dressing in small pieces and thick sauce. Soft dumplings or spaetzle in small pieces and butter or gravy. Vegetables Soft, well-cooked vegetables in small pieces. Soft-cooked, mashed potatoes. Thickened vegetable juice. Fruits Canned or cooked fruits that are soft or moist and do not have skin or seeds. Fresh, soft bananas. Thickened fruit juices. Meat and other protein foods Tender, moist meats or poultry in small pieces. Moist meatballs or meatloaf. Fish without bones. Eggs or egg substitutes in small pieces. Tofu. Tempeh and meat alternatives in small pieces. Well-cooked, tender beans, peas, baked beans, and other legumes. Dairy Thickened milk. Cream cheese. Yogurt. Cottage cheese. Sour cream. Small pieces of soft cheese. Fats and oils Butter. Oils. Margarine. Mayonnaise. Gravy. Spreads. Sweets and desserts Soft, smooth, moist desserts. Pudding. Custard. Moist cakes. Jam. Jelly. Honey. Preserves. Ask your health care provider whether you can have frozen desserts. Seasoning and other foods All seasonings and sweeteners. All sauces with small chunks. Prepared tuna, egg, or chicken   salad without raw fruits or vegetables. Moist casseroles with small, tender pieces of meat. Soups with tender meat. What foods are not allowed? Grains Coarse or dry cereals. Dry breads. Toast. Crackers. Tough, crusty breads, such as French bread and baguettes.  Dry pancakes, waffles, and muffins. Sticky rice. Dry bread stuffing. Granola. Popcorn. Chips. Vegetables All raw vegetables. Cooked corn. Rubbery or stiff cooked vegetables. Stringy vegetables, such as celery. Tough, crisp fried potatoes. Potato skins. Fruits Hard, crunchy, stringy, high-pulp, and juicy raw fruits such as apples, pineapple, papaya, and watermelon. Small, round fruits, such as grapes. Dried fruit and fruit leather. Meat and other protein foods Large pieces of meat. Dry, tough meats, such as bacon, sausage, and hot dogs. Chicken, turkey, or fish with skin and bones. Crunchy peanut butter. Nuts. Seeds. Nut and seed butters. Dairy Yogurt with nuts, seeds, or large chunks. Large chunks of cheese. Frozen desserts and milk consistency not allowed by your dietitian. Sweets and desserts Dry cakes. Chewy or dry cookies. Any desserts with nuts, seeds, dry fruits, coconut, pineapple, or anything dry, sticky, or hard. Chewy caramel. Licorice. Taffy-type candies. Ask your health care provider whether you can have frozen desserts. Seasoning and other foods Soups with tough or large chunks of meats, poultry, or vegetables. Corn or clam chowder. Smoothies with large chunks of fruit. Summary  Bite size foods can be helpful for people with moderate swallowing problems.  On the dysphagia eating plan, you may eat foods that are soft, moist, and cut into pieces smaller than 15mm by 15mm.  You may be instructed to thicken liquids. Follow your health care provider's instructions about how to do this and to what consistency. This information is not intended to replace advice given to you by your health care provider. Make sure you discuss any questions you have with your health care provider. Document Revised: 01/04/2019 Document Reviewed: 12/24/2016 Elsevier Patient Education  2020 [employer] Inc[employer]  

## 2020-09-05 NOTE — Evaluation (Signed)
Occupational Therapy Evaluation Patient Details Name: Kathy Wares MRN: [id number] DOB: 1956/02/20 Today's Date: 09/05/2020    History of Present Illness 64 y.o. male with medical history significant of history of sinus cancer in 2003, chronic mucoid otitis media, degenerative disc disease, dysphagia, ear mass, hearing loss, history of neck surgery, hypertension, hyperlipidemia, hypothyroidism who presented with dysphagia. 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   Clinical Impression   PTA patient was living alone in a home converted to an apartment with 12 STE and posts on 1 side. Patient reports independence with ADLs/IADLs including light meal prep, driving, and grocery shopping. Patient reports no use of AD prior to admission but states that he was holding onto furniture/walls in the home and relied heavily on holding onto grocery carts when shopping. Patient reports 4-5 falls in the last 6 months with a few requiring medical attention. Patient currently presents with deficits in balance and safety awareness requiring Min guard to supervision A grossly for ADLs. Patient would benefit from continued acute OT services to maximize safety and independence with self-care tasks in prep for safe d/c home with recommendation for initial 24hr supervision/assist from family.     Follow Up Recommendations  No OT follow up;Supervision/Assistance - 24 hour    Equipment Recommendations  None recommended by OT    Recommendations for Other Services       Precautions / Restrictions Precautions Precautions: Fall Precaution Comments: Very HOH, High fall risk Restrictions Weight Bearing Restrictions: No      Mobility Bed Mobility Overal bed mobility: Independent                  Transfers Overall transfer level: Needs assistance Equipment used: None Transfers: Sit to/from [employer] Transfers Sit to Stand: Supervision Stand pivot transfers: Min  guard       General transfer comment: Supervision to stand from EOB positioned in lowest setting and Min guard for stand-pivot transfers without AD. Lateral LOB requiring Min guard to correct.    Balance Overall balance assessment: Needs assistance Sitting-balance support: Feet supported Sitting balance-Leahy Scale: Good Sitting balance - Comments: Patient able to doff/don footwear seated EOB in figure-4 position.   Standing balance support: No upper extremity supported Standing balance-Leahy Scale: Poor Standing balance comment: Lateral LOB with turning requiring Min guard to correct. Would benefit from AD during mobility to decrease risk of falls.                           ADL either performed or assessed with clinical judgement   ADL Overall ADL's : Needs assistance/impaired     Grooming: Supervision/safety;Standing Grooming Details (indicate cue type and reason): For safety         Upper Body Dressing : Set up;Sitting   Lower Body Dressing: Supervision/safety;Sit to/from stand Lower Body Dressing Details (indicate cue type and reason): For safety Toilet Transfer: Copy Details (indicate cue type and reason): For safety without AD.         Functional mobility during ADLs: Min guard;Supervision/safety General ADL Comments: Supervision to Min guard for short-distance mobility in room. Min guard to correct L lateral LOB after turning.     Vision Baseline Vision/History: Wears glasses Wears Glasses:  (Sometimes) Patient Visual Report: No change from baseline Vision Assessment?: No apparent visual deficits     Perception     Praxis      Pertinent Vitals/Pain Pain Assessment: No/denies pain  Hand Dominance Right   Extremity/Trunk Assessment Upper Extremity Assessment Upper Extremity Assessment: Overall WFL for tasks assessed   Lower Extremity Assessment Lower Extremity Assessment: Defer to PT evaluation    Cervical / Trunk Assessment Cervical / Trunk Assessment: Normal   Communication Communication Communication: Other (comment) (Dysarthric)   Cognition Arousal/Alertness: Awake/alert Behavior During Therapy: WFL for tasks assessed/performed Overall Cognitive Status: Impaired/Different from baseline Area of Impairment: Safety/judgement                         Safety/Judgement: Decreased awareness of safety         General Comments  Patient reports sustaining 4-5 falls in the past 6 months with minor injuries. Patient also reports "furniture walking" in home and holding onto grocery carts in the store for balance. Patient is a high fall risk.    Exercises     Shoulder Instructions      Home Living Family/patient expects to be discharged to:: Private residence Living Arrangements: Alone Available Help at Discharge: Family;Available PRN/intermittently (Children and neice.) Type of Home: House (Converted to apartments) Home Access: Stairs to enter [employer] of Steps: 12 Entrance Stairs-Rails: None (Posts he can hand onto) Home Layout: One level     Bathroom Shower/Tub: Teacher, early years/pre: Standard     Home Equipment: None          Prior Functioning/Environment Level of Independence: Independent        Comments: Independent with ADLs/IADLs. Patient was still driving.        OT Problem List: Impaired balance (sitting and/or standing);Decreased safety awareness;Decreased knowledge of use of DME or AE      OT Treatment/Interventions: Self-care/ADL training;Therapeutic exercise;Energy conservation;DME and/or AE instruction;Therapeutic activities;Patient/family education;Balance training    OT Goals(Current goals can be found in the care plan section) Acute Rehab OT Goals Patient Stated Goal: To return home. OT Goal Formulation: With patient Time For Goal Achievement: 09/19/20 Potential to Achieve Goals: Good ADL  Goals Additional ADL Goal #1: Patient will perform LB dressing in sitting/standing with LRAD and Mod I with good safety awareness. Additional ADL Goal #2: Patient will perform ADL transfers with LRAD, Mod A and no LOB. Additional ADL Goal #3: Patient will demonstrate increased safety awareness during ADL tasks.  OT Frequency: Min 2X/week   Barriers to D/C:            Co-evaluation              AM-PAC OT "6 Clicks" Daily Activity     Outcome Measure Help from another person eating meals?: None Help from another person taking care of personal grooming?: A Little Help from another person toileting, which includes using toliet, bedpan, or urinal?: A Little Help from another person bathing (including washing, rinsing, drying)?: A Little Help from another person to put on and taking off regular upper body clothing?: A Little Help from another person to put on and taking off regular lower body clothing?: A Little 6 Click Score: 19   End of Session Equipment Utilized During Treatment: Gait belt Nurse Communication: Mobility status  Activity Tolerance: Patient tolerated treatment well Patient left: in chair;with call bell/phone within reach;with chair alarm set  OT Visit Diagnosis: Unsteadiness on feet (R26.81);Repeated falls (R29.6)                Time: 5573-2202 OT Time Calculation (min): 40 min Charges:  OT General Charges $OT Visit: 1 Visit OT Evaluation $OT  Eval Low Complexity: 1 Low OT Treatments $Self Care/Home Management : 8-22 mins  Cashmere Dingley H. OTR/L Supplemental OT, Department of rehab services [phone]  Esker Dever R H. 09/05/2020, 10:21 AM

## 2020-09-05 NOTE — TOC Transition Note (Signed)
Transition of Care [facility]) - CM/SW Discharge Note   Patient Details  Name: Marcus Wright MRN: [id number] Date of Birth: June 01, 1956  [facility] CM/SW Contact:  Pollie Friar, RN Phone Number: 09/05/2020, 1:44 PM   Clinical Narrative:    Pt is discharging home with self care. No f/u per PT/OT.  Pt has transportation home.   Final next level of care: Home/Self Care Barriers to Discharge: No Barriers Identified   Patient Goals and CMS Choice        Discharge Placement                       Discharge Plan and Services                                     Social Determinants of Health (SDOH) Interventions     Readmission Risk Interventions No flowsheet data found.

## 2020-09-05 NOTE — Progress Notes (Signed)
OTO HNS PROGRESS NOTE  Patient seen and evaluated at bedside. No issues since operative biopsy. Has been evaluated by SLP, who recommended mechanical soft diet with thin liquids.  He worked with PT and OT today. He endorses nasal dryness, denies nasal bleeding.  Plan: Follow up final pathology Intranasal cultures obtained in [place] ED positive for MRSA, patient presently on doxycycline Recommend daily use of nasal saline rinses Discussed with oncology, they will await final pathology results before consulting Patient has scheduled outpatient follow-up with me on 09/25/2020 at 8:25 AM  Thank you for allowing me to participate in the care of this patient. Please do not hesitate to contact me with any questions or concerns.   Jason Coop, [facility] Cell: [phone]

## 2020-09-05 NOTE — Discharge Summary (Addendum)
[facility]  Physician Discharge Summary   Patient ID: Marcus Wright MRN: [id number] DOB/AGE: 66-Jun-1957 66 y.o.  Admit date: 09/01/2020 Discharge date: 09/05/2020  PCP: Valerie Roys, DO  DISCHARGE DIAGNOSES:  Nasopharyngeal mass Possible fungal ball Dysphagia Essential hypertension Hypothyroidism Normocytic anemia  RECOMMENDATIONS FOR OUTPATIENT FOLLOW UP: 1. Outpatient follow-up with ENT 2. Amlodipine has been discontinued due to low blood pressures   Home Health: None Equipment/Devices: None  CODE STATUS: Full code  DISCHARGE CONDITION: fair  Diet recommendation: Dysphagia 3 diet with thin liquid  INITIAL HISTORY: 64 y.o.malewith medical history significant ofhistory of sinus cancer in 2003, chronic mucoid otitis media, degenerative disc disease, dysphagia, ear mass, hearing loss, history of neck surgery, hypertension, hyperlipidemia, hypothyroidism who presented with dysphagia. As above patient has a history of sinus cancer status post radiation therapy and chemo in 2003. He has a long history of multiple ENT related issues since that time. An evaluation in November patient had a CT scan done on the 22nd which showed this nasopharyngeal mass. On the morning of 12/5 patient woke up in had a choking sensation. And during his choking sensations he had some difficulty breathing. After he got to the ED at [facility] his breathing had improved but he continued to have dysphagia and he was made n.p.o.  patient was subsequently transferred to this facility still be seen by his ENT consultant.    Consultations:  ENT, Dr. Fredric Dine  Procedures: Biopsy of Right nasopharyngeal mass with frozen sections   HOSPITAL COURSE:   Nasopharyngeal mass with dysphagia/history of sinus cancer in 2003 Patient seen by ENT.  Underwent biopsy of this mass.  According to the operative notes there was concern for fungal ball.  Sent for further analysis and pathology  microbiology. Patient was on Unasyn. Cultures obtained at [facility] show MRSA. Sensitivities reviewed. Discussed with ENT. Patient will be discharged on doxycycline for 5 more days. Seen also by speech therapy for his dysphagia. He will be placed on a dysphagia 3 diet. Patient will follow up with ENT in 1 to 2 weeks to discuss pathology reports.  Essential hypertension Blood pressure noted to be borderline low. We will discontinue his amlodipine.  Hypokalemia Repleted.    History of hypothyroidism May resume his home medications  Normocytic anemia No evidence of overt bleeding.  Slight drop in hemoglobin is likely dilutional.  Hearing loss/impairment   Overall stable. Okay for discharge home today. Discussed with the son in detail. Patient was seen by PT and OT. They did not recommend any outpatient follow-up.    PERTINENT LABS:  The results of significant diagnostics from this hospitalization (including imaging, microbiology, ancillary and laboratory) are listed below for reference.    Microbiology: Recent Results (from the past 240 hour(s))  SARS CORONAVIRUS 2 (TAT 6-24 HRS) Nasopharyngeal Nasopharyngeal Swab     Status: None   Collection Time: 08/28/20 11:30 AM   Specimen: Nasopharyngeal Swab  Result Value Ref Range Status   SARS Coronavirus 2 NEGATIVE NEGATIVE Final    Comment: (NOTE) SARS-CoV-2 target nucleic acids are NOT DETECTED.  The SARS-CoV-2 RNA is generally detectable in upper and lower respiratory specimens during the acute phase of infection. Negative results do not preclude SARS-CoV-2 infection, do not rule out co-infections with other pathogens, and should not be used as the sole basis for treatment or other patient management decisions. Negative results must be combined with clinical observations, patient history, and epidemiological information. The expected result is Negative.  Fact Sheet for Patients:  SugarRoll.be  Fact  Sheet for Healthcare Providers: https://www.woods-mathews.com/  This test is not yet approved or cleared by the [place] FDA and  has been authorized for detection and/or diagnosis of SARS-CoV-2 by FDA under an Emergency Use Authorization (EUA). This EUA will remain  in effect (meaning this test can be used) for the duration of the COVID-19 declaration under Se ction 564(b)(1) of the Act, 21 U.S.C. section 360bbb-3(b)(1), unless the authorization is terminated or revoked sooner.  Performed at [facility], [place] [address] [place], [place] [address]   Resp Panel by RT-PCR (Flu A&B, Covid) Nasopharyngeal Swab     Status: None   Collection Time: 08/31/20 10:06 AM   Specimen: Nasopharyngeal Swab; Nasopharyngeal(NP) swabs in vial transport medium  Result Value Ref Range Status   SARS Coronavirus 2 by RT PCR NEGATIVE NEGATIVE Final    Comment: (NOTE) SARS-CoV-2 target nucleic acids are NOT DETECTED.  The SARS-CoV-2 RNA is generally detectable in upper respiratory specimens during the acute phase of infection. The lowest concentration of SARS-CoV-2 viral copies this assay can detect is 138 copies/mL. A negative result does not preclude SARS-Cov-2 infection and should not be used as the sole basis for treatment or other patient management decisions. A negative result may occur with  improper specimen collection/handling, submission of specimen other than nasopharyngeal swab, presence of viral mutation(s) within the areas targeted by this assay, and inadequate number of viral copies(<138 copies/mL). A negative result must be combined with clinical observations, patient history, and epidemiological information. The expected result is Negative.  Fact Sheet for Patients:  EntrepreneurPulse.com.au  Fact Sheet for Healthcare Providers:  IncredibleEmployment.be  This test is no t yet approved or cleared by the [place] FDA and   has been authorized for detection and/or diagnosis of SARS-CoV-2 by FDA under an Emergency Use Authorization (EUA). This EUA will remain  in effect (meaning this test can be used) for the duration of the COVID-19 declaration under Section 564(b)(1) of the Act, 21 U.S.C.section 360bbb-3(b)(1), unless the authorization is terminated  or revoked sooner.       Influenza A by PCR NEGATIVE NEGATIVE Final   Influenza B by PCR NEGATIVE NEGATIVE Final    Comment: (NOTE) The Xpert Xpress SARS-CoV-2/FLU/RSV plus assay is intended as an aid in the diagnosis of influenza from Nasopharyngeal swab specimens and should not be used as a sole basis for treatment. Nasal washings and aspirates are unacceptable for Xpert Xpress SARS-CoV-2/FLU/RSV testing.  Fact Sheet for Patients: EntrepreneurPulse.com.au  Fact Sheet for Healthcare Providers: IncredibleEmployment.be  This test is not yet approved or cleared by the [place] FDA and has been authorized for detection and/or diagnosis of SARS-CoV-2 by FDA under an Emergency Use Authorization (EUA). This EUA will remain in effect (meaning this test can be used) for the duration of the COVID-19 declaration under Section 564(b)(1) of the Act, 21 U.S.C. section 360bbb-3(b)(1), unless the authorization is terminated or revoked.  Performed at [facility], [address], [place], [place] [address]   Aerobic/Anaerobic Culture (surgical/deep wound)     Status: None   Collection Time: 08/31/20  2:33 PM   Specimen: Nose; Tissue  Result Value Ref Range Status   Specimen Description   Final    NOSE Performed at [facility], [address], [place], [place] [address]    Special Requests   Final    Normal Performed at [facility], [address] [place], [place] [address]  Gram Stain   Final    FEW WBC PRESENT, PREDOMINANTLY PMN FEW GRAM POSITIVE COCCI IN CLUSTERS    Culture    Final    RARE METHICILLIN RESISTANT STAPHYLOCOCCUS AUREUS WITHIN MIXED ORGANISMS NO ANAEROBES ISOLATED Performed at [facility], [place] [address], [place], [place] [address]    Report Status 09/05/2020 FINAL  Final   Organism ID, Bacteria METHICILLIN RESISTANT STAPHYLOCOCCUS AUREUS  Final      Susceptibility   Methicillin resistant staphylococcus aureus - MIC*    CIPROFLOXACIN >=8 RESISTANT Resistant     ERYTHROMYCIN >=8 RESISTANT Resistant     GENTAMICIN <=0.5 SENSITIVE Sensitive     OXACILLIN >=4 RESISTANT Resistant     TETRACYCLINE <=1 SENSITIVE Sensitive     VANCOMYCIN 1 SENSITIVE Sensitive     TRIMETH/SULFA <=10 SENSITIVE Sensitive     CLINDAMYCIN <=0.25 SENSITIVE Sensitive     RIFAMPIN <=0.5 SENSITIVE Sensitive     Inducible Clindamycin NEGATIVE Sensitive     * RARE METHICILLIN RESISTANT STAPHYLOCOCCUS AUREUS  Culture, fungus without smear     Status: None (Preliminary result)   Collection Time: 09/03/20  4:48 PM   Specimen: PATH Other; ENT  Result Value Ref Range Status   Specimen Description WOUND RIGHT NASOPHARYNGEAL MASS  Final   Special Requests PATIENT ON FOLLOWING ANCEF  Final   Culture   Final    NO FUNGUS ISOLATED AFTER 1 DAY Performed at [facility], [address], [place], [place] [address]    Report Status PENDING  Incomplete  Aerobic/Anaerobic Culture (surgical/deep wound)     Status: None (Preliminary result)   Collection Time: 09/03/20  4:48 PM   Specimen: PATH Other; ENT  Result Value Ref Range Status   Specimen Description WOUND RIGHT NASOPHARYNGEAL MASS  Final   Special Requests PATIENT ON FOLLOWING ANCEF  Final   Gram Stain   Final    RARE WBC PRESENT, PREDOMINANTLY MONONUCLEAR NO ORGANISMS SEEN    Culture   Final    RARE GRAM NEGATIVE RODS RARE STAPHYLOCOCCUS AUREUS SUSCEPTIBILITIES TO FOLLOW HOLDING FOR POSSIBLE ANAEROBE Performed at [facility], [place] [address], [place], [place] [address]    Report Status PENDING   Incomplete     Labs:  COVID-19 Labs   Lab Results  Component Value Date   SARSCOV2NAA NEGATIVE 08/31/2020   [place] NEGATIVE 08/28/2020   [place] NEGATIVE 03/20/2020   [place] NEGATIVE 02/25/2020      Basic Metabolic Panel: Recent Labs  Lab 08/31/20 0856 09/01/20 0506 09/02/20 0351 09/03/20 0106 09/04/20 0400 09/05/20 0323  NA 138  --  142 141 138 137  K 4.1  --  3.8 3.4* 4.3 4.1  CL 102  --  108 109 102 105  CO2 27  --  24 25 24 23   GLUCOSE 116*  --  119* 99 138* 134*  BUN 8  --  21 17 13 19   CREATININE 1.00 0.93 0.97 0.89 0.88 0.89  CALCIUM 9.4  --  8.7* 8.3* 8.8* 8.4*  MG  --   --   --   --  2.6*  --    Liver Function Tests: Recent Labs  Lab 09/03/20 0106 09/04/20 0400 09/05/20 0323  AST 24 27 25   ALT 16 27 31   ALKPHOS 51 60 52  BILITOT 0.4 0.7 0.6  PROT 5.8* 6.6 6.1*  ALBUMIN 3.1* 3.7 3.3*   CBC: Recent Labs  Lab 09/01/20 0506 09/02/20 0351 09/03/20 0106 09/04/20 0400 09/05/20  0323  WBC 5.5 11.3* 7.3 5.4 6.8  NEUTROABS 4.5  --   --   --   --   HGB 11.2* 10.2* 9.8* 11.2* 11.2*  HCT 33.3* 30.9* 29.9* 34.1* 33.7*  MCV 84.1 84.7 85.9 84.4 84.7  PLT 324 341 306 296 318     IMAGING STUDIES DG Chest 2 View  Result Date: 08/31/2020 CLINICAL DATA:  Shortness of breath EXAM: CHEST - 2 VIEW COMPARISON:  March 20, 2020 FINDINGS: The heart size and mediastinal contours are within normal limits. Both lungs are clear. The visualized skeletal structures are unremarkable. IMPRESSION: No active cardiopulmonary disease. Electronically Signed   By: Dorise Bullion III M.D   On: 08/31/2020 11:49   CT Soft Tissue Neck W Contrast  Result Date: 08/31/2020 CLINICAL DATA:  Stridor and short of breath. Dysphagia. Rule out epiglottitis or tonsillitis. EXAM: CT NECK WITH CONTRAST TECHNIQUE: Multidetector CT imaging of the neck was performed using the standard protocol following the bolus administration of intravenous contrast. CONTRAST:  61mL OMNIPAQUE  IOHEXOL 300 MG/ML  SOLN COMPARISON:  CT neck 08/18/2020 FINDINGS: Pharynx and larynx: Mass in the right posterior nasopharynx again noted and unchanged. Central low density necrosis present. Mass is difficult to measure due to ill-defined margins but is approximately 2.5 cm in diameter. There is extension to the skull base on the right. There is posterior extension into the soft tissues around the right carotid and jugular vein without occlusion. Normal airway. Epiglottis and larynx normal. No parapharyngeal abscess. Salivary glands: Atrophic parotid and submandibular glands without mass or edema Thyroid: Small thyroid without focal abnormality Lymph nodes: Right level 1 lymph node 11 mm in short axis dimension. Left level 1 lymph node 8.7 mm in short axis dimension. No change from the prior study. No other significant lymph nodes in the neck. Vascular: Tumor infiltration around the right internal carotid artery right jugular vein below the skull base without vascular occlusion. Atherosclerotic calcification in the carotid bifurcation bilaterally. Limited intracranial: Negative Visualized orbits: Negative Mastoids and visualized paranasal sinuses: Prior sinus surgery on the right. Mucosal edema in the sphenoid sinus bilaterally right greater than left. Right mastoid and middle ear effusion consistent with obstruction of the eustachian tube. This is unchanged from the prior study. Skeleton: Fusion.  Interbody fusion C4-5 and C5-6 with solid Nasopharyngeal tumor extends to the right skull base. No definite bone destruction. Upper chest: Lung apices clear bilaterally. Other: None IMPRESSION: Nasopharyngeal mass unchanged from recent CT neck. This is most compatible with squamous cell carcinoma. There is infiltration around the right jugular vein and right carotid artery without vascular occlusion. Right mastoid effusion and right middle ear effusion due to occlusion of the eustachian tube. 11 mm right level 1 lymph  node and 8.7 mm left level 1 lymph node. No other significant adenopathy. Electronically Signed   By: Franchot Gallo M.D.   On: 08/31/2020 11:34   CT SOFT TISSUE NECK W CONTRAST  Result Date: 08/19/2020 CLINICAL DATA:  Nasopharyngeal mass EXAM: CT NECK WITH CONTRAST TECHNIQUE: Multidetector CT imaging of the neck was performed using the standard protocol following the bolus administration of intravenous contrast. CONTRAST:  84mL ISOVUE-300 IOPAMIDOL (ISOVUE-300) INJECTION 61% COMPARISON:  Head CT 07/25/2020 FINDINGS: Pharynx and larynx: Heterogeneous right nasopharyngeal mass effaces the fossa of Rosenmuller and measures approximately 2.5 x 0.9 cm. The mass extends into the right carotid space and encases the right internal carotid artery and internal jugular vein. The oropharynx and hypopharynx are normal. Normal epiglottis  and larynx. No peritonsillar or retropharyngeal fluid collection. Salivary glands: No inflammation, mass, or stone. Thyroid: Normal. Lymph nodes: Bilateral subcentimeter level 1B lymph nodes and left level 1A node. No lower cervical adenopathy. Vascular: Right internal carotid artery and internal jugular vein are encased by the nasopharyngeal mass near the skull base. There is atherosclerotic disease at the carotid bifurcations without high-grade stenosis. Small amount of calcific aortic atherosclerosis. Limited intracranial: Normal Visualized orbits: Normal Mastoids and visualized paranasal sinuses: Right mastoid effusion. The middle ear is also opacified. The nasopharyngeal mass likely obstructs the eustachian tube. Skeleton: No acute or aggressive process. Upper chest: Negative. Other: None. IMPRESSION: 1. 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 2. Right mastoid effusion and opacification of the right middle ear. 3. Bilateral subcentimeter level 1B and left level 1A lymph nodes.  Aortic Atherosclerosis (ICD10-I70.0). Electronically Signed   By: Ulyses Jarred M.D.   On: 08/19/2020 02:51    DISCHARGE EXAMINATION: Vitals:   09/05/20 0500 09/05/20 0738 09/05/20 1125 09/05/20 1209  BP:  111/78 95/71 100/71  Pulse:  (!) 56 64 60  Resp:  18 16 20   Temp:  (!) 97.5 F (36.4 C) 98 F (36.7 C)   TempSrc:  Oral Oral   SpO2:  99% 99% 99%  Weight: 74 kg     Height:       General appearance: Awake alert.  In no distress. Hard of hearing Resp: Clear to auscultation bilaterally.  Normal effort Cardio: S1-S2 is normal regular.  No S3-S4.  No rubs murmurs or bruit GI: Abdomen is soft.  Nontender nondistended.  Bowel sounds are present normal.  No masses organomegaly    DISPOSITION: Home with son  Discharge Instructions    Call MD for:  difficulty breathing, headache or visual disturbances   Complete by: As directed    Call MD for:  extreme fatigue   Complete by: As directed    Call MD for:  persistant dizziness or light-headedness   Complete by: As directed    Call MD for:  persistant nausea and vomiting   Complete by: As directed    Call MD for:  severe uncontrolled pain   Complete by: As directed    Call MD for:  temperature >100.4   Complete by: As directed    Discharge instructions   Complete by: As directed    Please take your medications as prescribed.  ENT doctor will call you with the results of the pathology reports.  Seek attention if you have difficulty swallowing or if you feel like you are choking.  You were cared for by a hospitalist during your hospital stay. If you have any questions about your discharge medications or the care you received while you were in the hospital after you are discharged, you can call the unit and asked to speak with the hospitalist on call if the hospitalist that took care of you is not available. Once you are discharged, your primary care physician will handle any further medical issues. Please note that NO REFILLS for any  discharge medications will be authorized once you are discharged, as it is imperative that you return to your primary care physician (or establish a relationship with a primary care physician if you do not have one) for your aftercare needs so that they can reassess your need for medications and monitor your lab values. If you do not have a primary care physician,  you can call [phone] for a physician referral.   Increase activity slowly   Complete by: As directed    No wound care   Complete by: As directed          Allergies as of 09/05/2020      Reactions   Doxazosin Hives   Unknown      Medication List    STOP taking these medications   amLODipine 2.5 MG tablet Commonly known as: NORVASC     TAKE these medications   doxycycline 100 MG tablet Commonly known as: VIBRA-TABS Take 1 tablet (100 mg total) by mouth every 12 (twelve) hours for 7 days.   levothyroxine 50 MCG tablet Commonly known as: SYNTHROID TAKE 1 TABLET BY MOUTH EVERY DAY   meloxicam 15 MG tablet Commonly known as: MOBIC Take 15 mg by mouth daily.   rosuvastatin 20 MG tablet Commonly known as: CRESTOR Take 1 tablet (20 mg total) by mouth daily.         Follow-up Information    Skotnicki, Meghan A, DO Follow up.   Specialty: Otolaryngology Why: Appointment scheduled for 09/25/2020 at 8:25 AM, please arrive 5-10 minutes ahead of scheduled appointment  Contact information: [place] [address] [phone]               TOTAL DISCHARGE TIME: 71 Minutes  [place]  [facility] operational officer on [email or web address]  09/05/2020, 3:35 PM

## 2020-09-08 ENCOUNTER — Other Ambulatory Visit: Payer: Self-pay | Admitting: Physician Assistant

## 2020-09-08 LAB — AEROBIC/ANAEROBIC CULTURE W GRAM STAIN (SURGICAL/DEEP WOUND)

## 2020-09-08 LAB — SURGICAL PATHOLOGY

## 2020-09-24 LAB — CULTURE, FUNGUS WITHOUT SMEAR

## 2020-10-09 NOTE — Progress Notes (Signed)
Head and Neck Cancer Location of Tumor / Histology: Nasopharyngeal carcinoma   (patient has a history of sinus carcinoma, status post chemoradiation treatment in early 2000's at [facility]. Patient had been followed closely by otolaryngology department at [facility] until about 2018 for his bilateral severe to profound mixed hearing loss, bilateral osteoradionecrosis and right ME granulation/polyps that causes persistent drainage.)  CT Neck w/ Contrast  08/31/2020 IMPRESSION: --Nasopharyngeal mass unchanged from recent CT neck. --This is most compatible with squamous cell carcinoma. There is infiltrationaround the right jugular vein and right carotid artery without vascular occlusion. Right mastoid effusion and right middle ear effusion due to occlusion of the eustachian tube. --11 mm right level 1 lymph node and 8.7 mm left level 1 lymph node. --No other significant adenopathy.   Patient presented with symptoms of:  to the ED on 07/25/2020 after experiencing a brief episode of pain in his head, losing his balance, and falling backwards.  Biopsies revealed:  09/03/2020 FINAL MICROSCOPIC DIAGNOSIS:  A. NASOPHARYNGEAL MASS, RIGHT, BIOPSY:  - Inflamed sinonasal mucosa with reactive atypia.  - PAS is negative for fungal organisms.  B. SPHENOID SINUS MUCOUS, BIOPSY:  - Inflamed sinonasal mucosa.  - PAS is negative for fungal organisms.  C. NASOPHARYNGEAL MASS, RIGHT #2, BIOPSY:  - Inflamed sinonasal mucosa with ulcer and reactive atypia.  - Rare yeast like forms on PAS.  D. NASOPHARYNGEAL MASS, RIGHT #3, BIOPSY:  - Benign soft tissue with inflammatory exudate.  - PAS is negative for fungal organisms.  E. NASOPHARYNGEAL MASS, RIGHT, BIOPSY:  - Inflamed soft tissue.  - PAS is negative for fungal organisms.   INTRAOPERATIVE DIAGNOSIS:  A. NASOPHARYNGEAL MASS, RIGHT, BIOPSY, FROZEN SECTION:    Inflamed mucosa with slight atypia.    Rapid intraoperative consult diagnosis rendered by Dr.  Saralyn Pilar @  [phone] 09/03/2020.  B. SPHENOID SINUS MUCOUS, BIOPSY, FROZEN SECTION:    Inflamed sinus mucosa.    Rapid intraoperative consult diagnosis rendered by Dr. Saralyn Pilar @  [phone] 09/03/2020.  C. NASOPHARYNGEAL MASS, RIGHT #2, BIOPSY, FROZEN SECTION:    Ulcer with atypia.    Rapid intraoperative consult diagnosis rendered by Dr. Saralyn Pilar @  1711 09/03/2020.  D. NASOPHARYNGEAL MASS, RIGHT #3, BIOPSY, FROZEN SECTION:    Mucus and exudate.    Rapid intraoperative consult diagnosis rendered by Dr. Saralyn Pilar @  1714 09/03/2020.   Nutrition Status Yes No Comments  Weight changes? []  [x]    Swallowing concerns? [x]  []  Yes--unable to tolerate more solid foods. He can tolerate soft or mashed foods. Denies any issues with thin liquids   PEG? []  [x]     Referrals Yes No Comments  Social Work? [x]  []    Dentistry? [x]  []    Swallowing therapy? [x]  []    Nutrition? [x]  []    Med/Onc? []  [x]     Safety Issues Yes No Comments  Prior radiation? [x]  []    Pacemaker/ICD? []  [x]    Possible current pregnancy? []  [x]  N/A  Is the patient on methotrexate? []  [x]     Tobacco/Marijuana/Snuff/ETOH use: Patient is a former smoker. Denies any alcohol consumption or recreational drug use.   Past/Anticipated interventions by otolaryngology, if any:  09/03/2020 Dr. Ebbie Latus Biopsy of Right nasopharyngeal mass with frozen sections  Past/Anticipated interventions by medical oncology, if any:  No referral placed at this time   Current Complaints / other details:   Patient has not received a COVID-19 vaccine but has had his annual flu shot

## 2020-10-10 ENCOUNTER — Ambulatory Visit
Admission: RE | Admit: 2020-10-10 | Discharge: 2020-10-10 | Disposition: A | Discharge status: Home / Self Care | Payer: Medicare Other | Source: Ambulatory Visit | Attending: Radiation Oncology | Admitting: Radiation Oncology

## 2020-10-10 ENCOUNTER — Encounter: Payer: Self-pay | Admitting: Radiation Oncology

## 2020-10-10 DIAGNOSIS — C313 Malignant neoplasm of sphenoid sinus: Secondary | ICD-10-CM

## 2020-10-10 DIAGNOSIS — J392 Other diseases of pharynx: Secondary | ICD-10-CM

## 2020-10-10 DIAGNOSIS — Z87891 Personal history of nicotine dependence: Secondary | ICD-10-CM | POA: Diagnosis not present

## 2020-10-10 NOTE — Progress Notes (Addendum)
Radiation Oncology         [phone] ________________________________  Initial Outpatient Consultation by telephone.  The patient opted for telemedicine to maximize safety during the pandemic.  MyChart video was not obtainable.   Name: Marcus Wright MRN: [id number]  Date: 10/10/2020  DOB: 02-27-1956  MG:[id number], Megan P, DO  Skotnicki, Meghan A, DO   REFERRING PHYSICIAN: Skotnicki, Meghan A, DO  DIAGNOSIS:    ICD-10-CM   1. 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 male who had a previous history of head and neck cancer.  He cannot recall what type of cancer it was specifically.  I access to outside records is incomplete.  The patient is very hard of hearing so his son provides most of the the history and the patient provides some details as needed.  He pursued radiotherapy at [facility] with Dr. Donella Stade. He also received chemotherapy. He received 36 fractions of radiation to his recollection. This was in 2003 or 2004, approximately. This was for "sinus" carcinoma per Dr. Dorathy Kinsman notes. Also notes referring to sphenoid cancer.  More recently he presented to the ED on 07/25/2020 after experiencing a brief episode of pain in his head, losing his balance, and falling backwards. Head CT scan at that time showed a worrisome right nasopharyngeal mass.   Subsequently, the patient saw Dr. Pryor Ochoa, who performed a trans-nasal flexible laryngoscopy on 08/31/2020. Results showed a right skull base mass encasing the carotid artery with new onset dysphagia and signs of nasopharyngitis. 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  Pertinent imaging thus far includes: 1. Soft tissue neck CT scan performed on 08/18/2020 revealing right nasopharyngeal carcinoma with  extension into the right carotid space, encasing the right internal carotid artery and internal jugular vein. There was also noted to be a right mastoid effusion and opacification of the right middle ear and bilateral subcentimeter level 1B and left level 1A lymph nodes. 2. Soft tissue neck CT scan performed on 08/31/2020 revealing an unchanged nasopharyngeal mass that was most compatible with squamous cell carcinoma. There was infiltration around the right jugular vein and right carotid artery without vascular occlusion. There was also noted to be a right mastoid effusion and right middle ear effusion due to occlusion of the eustachian tube. Additionally, there was an 11 mm right level 1 lymph node and an 8.7 mm left level 1 lymph node. There was no other significant adenopathy.  Biopsy of the right nasopharyngeal mass on the date of 09/03/2020 revealed inflamed sinonasal mucosa with reactive atypia and inflamed soft tissue. There was also inflamed sinonasal mucosa of the sphenoid sinus mucous, inflamed sinonasal mucosa with ulcer and reactive atypia of the nasopharyngeal mass (#2), and benign soft tissue with inflammatory exudate of the right nasopharyngeal mass (#3).  Missed 12/30 f/u with ENT due to son having COVID. Has 1/24 f/u rescheduled.  He is scared of radiation therapy because of "how it went down at [facility]."  Swallowing issues, if any: He is unable to tolerate more solid foods.  He can tolerate mashed potatoes and other soft foods.  He denies trouble with liquids.  Weight Changes: Denies  Tobacco history, if any: He is a former smoker.  ETOH abuse, if any: Denies  He has been vaccinated against the flu  but not against COVID  PREVIOUS RADIATION THERAPY: Yes, in 2003 for history of right sphenoid SCCA.    PAST MEDICAL HISTORY:  has a past medical history of Cancer ([place]) (2003), Hyperlipidemia, Hypertension, Hypothyroidism, Thyroid disorder, Wears dentures, and Wears hearing aid.     PAST SURGICAL HISTORY: Past Surgical History:  Procedure Laterality Date  . BACK SURGERY  2011  . BREAST SURGERY Right 11-22-12  . CHOLECYSTECTOMY    . COLONOSCOPY    . COLONOSCOPY WITH PROPOFOL N/A 07/19/2016   Procedure: COLONOSCOPY WITH PROPOFOL;  Surgeon: Lucilla Lame, MD;  Location: [place];  Service: Endoscopy;  Laterality: N/A;  . REPLACEMENT TOTAL KNEE Right 1993  . SINUS ENDO W/FUSION Right 09/03/2020   Procedure: Removal of Right nasopharyngeal mass, Biopsy and frozen sections;  Surgeon: Jason Coop, DO;  Location: [place];  Service: ENT;  Laterality: Right;  . SINUS SURGERY WITH INSTATRAK  2003   cancer    FAMILY HISTORY: family history is not on file.  SOCIAL HISTORY:  reports that he has quit smoking. He has never used smokeless tobacco. He reports current alcohol use. He reports that he does not use drugs.  ALLERGIES: Doxazosin  MEDICATIONS:  Current Outpatient Medications  Medication Sig Dispense Refill  . amLODipine (NORVASC) 2.5 MG tablet Take 2.5 mg by mouth daily.    Marland Kitchen levothyroxine (SYNTHROID) 50 MCG tablet TAKE 1 TABLET BY MOUTH EVERY DAY (Patient taking differently: Take 50 mcg by mouth daily.) 90 tablet 3  . rosuvastatin (CRESTOR) 20 MG tablet Take 1 tablet (20 mg total) by mouth daily. 90 tablet 1  . meloxicam (MOBIC) 15 MG tablet Take 15 mg by mouth daily. (Patient not taking: Reported on 10/10/2020)     No current facility-administered medications for this encounter.    REVIEW OF SYSTEMS:  Notable for that above.   PHYSICAL EXAM:  vitals were not taken for this visit.     LABORATORY DATA:  Lab Results  Component Value Date   WBC 6.8 09/05/2020   HGB 11.2 (L) 09/05/2020   HCT 33.7 (L) 09/05/2020   MCV 84.7 09/05/2020   PLT 318 09/05/2020   CMP     Component Value Date/Time   NA 137 09/05/2020 0323   NA 141 06/03/2020 0904   NA 138 07/18/2012 1026   K 4.1 09/05/2020 0323   K 4.1 07/18/2012 1026   CL 105 09/05/2020 0323    CL 101 07/18/2012 1026   CO2 23 09/05/2020 0323   CO2 27 07/18/2012 1026   GLUCOSE 134 (H) 09/05/2020 0323   GLUCOSE 94 07/18/2012 1026   BUN 19 09/05/2020 0323   BUN 8 06/03/2020 0904   BUN 10 07/18/2012 1026   CREATININE 0.89 09/05/2020 0323   CREATININE 1.03 07/18/2012 1026   CALCIUM 8.4 (L) 09/05/2020 0323   CALCIUM 9.3 07/18/2012 1026   PROT 6.1 (L) 09/05/2020 0323   PROT 6.9 06/03/2020 0904   PROT 7.5 07/18/2012 1026   ALBUMIN 3.3 (L) 09/05/2020 0323   ALBUMIN 4.7 06/03/2020 0904   ALBUMIN 4.3 07/18/2012 1026   AST 25 09/05/2020 0323   AST 21 07/18/2012 1026   ALT 31 09/05/2020 0323   ALT 35 07/18/2012 1026   ALKPHOS 52 09/05/2020 0323   ALKPHOS 79 07/18/2012 1026   BILITOT 0.6 09/05/2020 0323   BILITOT 0.4 06/03/2020 0904   BILITOT 0.5 07/18/2012 1026   GFRNONAA >60 09/05/2020 0323   GFRNONAA >60 07/18/2012 1026   GFRAA 99 06/03/2020 0904  GFRAA >60 07/18/2012 1026      Lab Results  Component Value Date   TSH 0.906 06/03/2020     RADIOGRAPHY: As above, I personally reviewed his CTs of the neck  IMPRESSION/PLAN: Right nasopharyngeal mass with distant history of head and neck cancer, ? Sphenoid sinus cancer;  patient reports history of about 7 weeks of radiation therapy and 2003 or 2004 at [facility]  Recent biopsies were not diagnostic of any neoplasm.    Patient's case warrants further discussion at our tumor board next week.  He will be discussed and then we will determine next steps of care.  He missed his follow-up with otolaryngology but this has been rescheduled for later in the month.  At this time I do not see any role for radiation therapy.  If he is determined to have an injury related to radiation therapy it would be prudent for him to follow-up with his radiation oncologist at [facility] which is closest to his home (Dr. Baruch Gouty).  If he is ultimately determined to have a cancer diagnosis it would also be prudent for him to  follow-up there.  Our patient navigator, Anderson Malta, was present for the call.  She will get back in touch with the family once his disposition is discussed at our tumor board.  The patient and his son expressed gratitude for our call and have her contact information if questions arise in the interim.  This encounter was provided by telemedicine platform; patient desired telemedicine during pandemic precautions.  MyChart video was not available so telephone was used. The patient has given verbal consent for this type of encounter and has been advised to only accept a meeting of this type in a secure network environment. On date of service, in total, I spent 40 minutes on this encounter.   The attendants for this meeting include Eppie Gibson  and Paschal Dopp Vassel During the encounter, Eppie Gibson was located at [facility] Radiation Oncology Department.  Paschal Dopp Kolenda was located at home.   __________________________________________   Eppie Gibson, MD  This document serves as a record of services personally performed by Eppie Gibson, MD. It was created on his behalf by Clerance Lav, a trained medical scribe. The creation of this record is based on the scribe's personal observations and the provider's statements to them. This document has been checked and approved by the attending provider.

## 2020-10-10 NOTE — Progress Notes (Signed)
Oncology Nurse Navigator Documentation  Met with patient during initial telephone consult with Marcus Wright.  He was assisted by his son due to hearing difficulties.  . Introduced myself as his/their Navigator, explained my role as a member of the Care Team. . They are aware that [name] case will be discussed in ENT conference on 10/15/20 and that I will call them with the results of the discussion.  . I encouraged them to call with questions/concerns moving forward.  Harlow Asa, RN, BSN, OCN Head & Neck Oncology Nurse [place] at [place] [phone]

## 2020-10-14 ENCOUNTER — Ambulatory Visit: Payer: Medicare Other

## 2020-10-14 ENCOUNTER — Ambulatory Visit: Payer: Medicare Other | Admitting: Radiation Oncology

## 2020-10-17 NOTE — Progress Notes (Signed)
Oncology Nurse Navigator Documentation  I called and left a voice mail with Mr. Intriago son, Leeann Must. I updated him on the discussion during Head and Neck Conference on 10/15/20. I informed him of his appointment on 10/20/20 with Dr. Fredric Dine and provided that offices phone number. I also left my direct contact information to call me if he had any further questions or concerns.   Harlow Asa RN, BSN, OCN Head & Neck Oncology Nurse [place] at [facility] Phone # [phone]  Fax # [phone]

## 2020-10-20 ENCOUNTER — Other Ambulatory Visit: Payer: Self-pay | Admitting: Otolaryngology

## 2020-10-20 DIAGNOSIS — R1314 Dysphagia, pharyngoesophageal phase: Secondary | ICD-10-CM | POA: Diagnosis not present

## 2020-10-20 DIAGNOSIS — H61893 Other specified disorders of external ear, bilateral: Secondary | ICD-10-CM | POA: Diagnosis not present

## 2020-10-20 DIAGNOSIS — J343 Hypertrophy of nasal turbinates: Secondary | ICD-10-CM | POA: Diagnosis not present

## 2020-10-20 DIAGNOSIS — H9211 Otorrhea, right ear: Secondary | ICD-10-CM | POA: Diagnosis not present

## 2020-10-20 DIAGNOSIS — J342 Deviated nasal septum: Secondary | ICD-10-CM | POA: Diagnosis not present

## 2020-10-20 DIAGNOSIS — J3489 Other specified disorders of nose and nasal sinuses: Secondary | ICD-10-CM | POA: Diagnosis not present

## 2020-10-20 DIAGNOSIS — Y842 Radiological procedure and radiotherapy as the cause of abnormal reaction of the patient, or of later complication, without mention of misadventure at the time of the procedure: Secondary | ICD-10-CM | POA: Diagnosis not present

## 2020-10-20 DIAGNOSIS — H906 Mixed conductive and sensorineural hearing loss, bilateral: Secondary | ICD-10-CM | POA: Diagnosis not present

## 2020-10-20 DIAGNOSIS — Z8522 Personal history of malignant neoplasm of nasal cavities, middle ear, and accessory sinuses: Secondary | ICD-10-CM | POA: Diagnosis not present

## 2020-10-20 DIAGNOSIS — M8738 Other secondary osteonecrosis, other site: Secondary | ICD-10-CM | POA: Diagnosis not present

## 2020-10-20 DIAGNOSIS — J392 Other diseases of pharynx: Secondary | ICD-10-CM

## 2020-11-13 ENCOUNTER — Other Ambulatory Visit: Payer: Self-pay | Admitting: Family Medicine

## 2020-11-13 NOTE — Telephone Encounter (Signed)
Requested medications are due for refill today yes  Requested medications are on the active medication list yes  Last refill 11/21  Last visit 10/10/2020  Future visit scheduled 12/01/20  Notes to clinic Historical Provider

## 2020-11-17 ENCOUNTER — Ambulatory Visit (INDEPENDENT_AMBULATORY_CARE_PROVIDER_SITE_OTHER): Payer: Medicare Other | Admitting: Family Medicine

## 2020-11-17 ENCOUNTER — Other Ambulatory Visit: Payer: Self-pay

## 2020-11-17 ENCOUNTER — Encounter: Payer: Self-pay | Admitting: Family Medicine

## 2020-11-17 VITALS — BP 132/73 | HR 78 | Temp 98.5°F | Wt 164.4 lb

## 2020-11-17 DIAGNOSIS — H6011 Cellulitis of right external ear: Secondary | ICD-10-CM

## 2020-11-17 MED ORDER — SULFAMETHOXAZOLE-TRIMETHOPRIM 800-160 MG PO TABS: 1.0000 | ORAL_TABLET | Freq: Two times a day (BID) | ORAL | 0 refills | Status: DC

## 2020-11-17 NOTE — Progress Notes (Signed)
BP 132/73   Pulse 78   Temp 98.5 F (36.9 C)   Wt 164 lb 6.4 oz (74.6 kg)   SpO2 97%   BMI 22.30 kg/m    Subjective:    Patient ID: Marcus Wright, male    DOB: [birth year], 65 y.o.   MRN: [id number]  HPI: Marcus Wright is a 65 y.o. male  Chief Complaint  Patient presents with  . Ear Pain    Patient states right ear started hurting over the weekend    EAR PAIN Duration: 3 days Involved ear(s): right Severity:  severe  Quality:  Aching and sore Fever: no Otorrhea: yes Upper respiratory infection symptoms: no Pruritus: yes Hearing loss: yes Water immersion no Using Q-tips: no Recurrent otitis media: yes Status: worse Treatments attempted: none  Relevant past medical, surgical, family and social history reviewed and updated as indicated. Interim medical history since our last visit reviewed. Allergies and medications reviewed and updated.  Review of Systems  Constitutional: Negative.   HENT: Positive for ear pain. Negative for congestion, dental problem, drooling, ear discharge, facial swelling, hearing loss, mouth sores, nosebleeds, postnasal drip, rhinorrhea, sinus pressure, sinus pain, sneezing, sore throat, tinnitus, trouble swallowing and voice change.   Respiratory: Negative.   Cardiovascular: Negative.   Gastrointestinal: Negative.   Musculoskeletal: Negative.   Neurological: Negative.   Psychiatric/Behavioral: Negative.     Per HPI unless specifically indicated above     Objective:    BP 132/73   Pulse 78   Temp 98.5 F (36.9 C)   Wt 164 lb 6.4 oz (74.6 kg)   SpO2 97%   BMI 22.30 kg/m   Wt Readings from Last 3 Encounters:  11/17/20 164 lb 6.4 oz (74.6 kg)  09/05/20 163 lb 2.3 oz (74 kg)  08/31/20 169 lb 12.1 oz (77 kg)    Physical Exam Vitals and nursing note reviewed.  Constitutional:      General: He is not in acute distress.    Appearance: Normal appearance. He is not ill-appearing, toxic-appearing or diaphoretic.  HENT:      Head: Normocephalic and atraumatic.     Right Ear: External ear normal.     Left Ear: External ear normal.     Ears:     Comments: Erythema and swelling in the R ear, no drainage    Nose: Nose normal.     Mouth/Throat:     Mouth: Mucous membranes are moist.     Pharynx: Oropharynx is clear.  Eyes:     General: No scleral icterus.       Right eye: No discharge.        Left eye: No discharge.     Extraocular Movements: Extraocular movements intact.     Conjunctiva/sclera: Conjunctivae normal.     Pupils: Pupils are equal, round, and reactive to light.  Cardiovascular:     Rate and Rhythm: Normal rate and regular rhythm.     Pulses: Normal pulses.     Heart sounds: Normal heart sounds. No murmur heard. No friction rub. No gallop.   Pulmonary:     Effort: Pulmonary effort is normal. No respiratory distress.     Breath sounds: Normal breath sounds. No stridor. No wheezing, rhonchi or rales.  Chest:     Chest wall: No tenderness.  Musculoskeletal:        General: Normal range of motion.     Cervical back: Normal range of motion and neck supple.  Skin:  General: Skin is warm and dry.     Capillary Refill: Capillary refill takes less than 2 seconds.     Coloration: Skin is not jaundiced or pale.     Findings: No bruising, erythema, lesion or rash.  Neurological:     General: No focal deficit present.     Mental Status: He is alert and oriented to person, place, and time. Mental status is at baseline.  Psychiatric:        Mood and Affect: Mood normal.        Behavior: Behavior normal.        Thought Content: Thought content normal.        Judgment: Judgment normal.     Results for orders placed or performed during the hospital encounter of 09/01/20  Culture, fungus without smear   Specimen: PATH Other; ENT  Result Value Ref Range   Specimen Description WOUND RIGHT NASOPHARYNGEAL MASS    Special Requests PATIENT ON FOLLOWING ANCEF    Culture      NO FUNGUS ISOLATED AFTER  21 DAYS Performed at [facility], [place] [address], [place], [place] [address]    Report Status 09/24/2020 FINAL   Aerobic/Anaerobic Culture (surgical/deep wound)   Specimen: PATH Other; ENT  Result Value Ref Range   Specimen Description WOUND RIGHT NASOPHARYNGEAL MASS    Special Requests PATIENT ON FOLLOWING ANCEF    Gram Stain      RARE WBC PRESENT, PREDOMINANTLY MONONUCLEAR NO ORGANISMS SEEN    Culture      RARE ESCHERICHIA COLI RARE METHICILLIN RESISTANT STAPHYLOCOCCUS AUREUS Confirmed Extended Spectrum Beta-Lactamase Producer (ESBL).  In bloodstream infections from ESBL organisms, carbapenems are preferred over piperacillin/tazobactam. They are shown to have a lower risk of mortality. NO ANAEROBES ISOLATED Performed at [facility], [place] [address] [place], [place] [address]    Report Status 09/08/2020 FINAL    Organism ID, Bacteria METHICILLIN RESISTANT STAPHYLOCOCCUS AUREUS    Organism ID, Bacteria ESCHERICHIA COLI       Susceptibility   Escherichia coli - MIC*    AMPICILLIN >=32 RESISTANT Resistant     CEFAZOLIN >=64 RESISTANT Resistant     CEFEPIME 1 SENSITIVE Sensitive     CEFTAZIDIME RESISTANT Resistant     CEFTRIAXONE 32 RESISTANT Resistant     CIPROFLOXACIN >=4 RESISTANT Resistant     GENTAMICIN <=1 SENSITIVE Sensitive     IMIPENEM <=0.25 SENSITIVE Sensitive     TRIMETH/SULFA >=320 RESISTANT Resistant     AMPICILLIN/SULBACTAM >=32 RESISTANT Resistant     PIP/TAZO 8 SENSITIVE Sensitive     * RARE ESCHERICHIA COLI   Methicillin resistant staphylococcus aureus - MIC*    CIPROFLOXACIN >=8 RESISTANT Resistant     ERYTHROMYCIN >=8 RESISTANT Resistant     GENTAMICIN <=0.5 SENSITIVE Sensitive     OXACILLIN >=4 RESISTANT Resistant     TETRACYCLINE <=1 SENSITIVE Sensitive     VANCOMYCIN 1 SENSITIVE Sensitive     TRIMETH/SULFA <=10 SENSITIVE Sensitive     CLINDAMYCIN <=0.25 SENSITIVE Sensitive     RIFAMPIN <=0.5 SENSITIVE Sensitive     Inducible Clindamycin  NEGATIVE Sensitive     * RARE METHICILLIN RESISTANT STAPHYLOCOCCUS AUREUS  HIV Antibody (routine testing w rflx)  Result Value Ref Range   HIV Screen 4th Generation wRfx Non Reactive Non Reactive  Basic metabolic panel  Result Value Ref Range   Sodium 142 135 - 145 mmol/L   Potassium 3.8 3.5 - 5.1 mmol/L   Chloride 108 98 -  111 mmol/L   CO2 24 22 - 32 mmol/L   Glucose, Bld 119 (H) 70 - 99 mg/dL   BUN 21 8 - 23 mg/dL   Creatinine, Ser 0.97 0.61 - 1.24 mg/dL   Calcium 8.7 (L) 8.9 - 10.3 mg/dL   GFR, Estimated >60 >60 mL/min   Anion gap 10 5 - 15  CBC  Result Value Ref Range   WBC 11.3 (H) 4.0 - 10.5 K/uL   RBC 3.65 (L) 4.22 - 5.81 MIL/uL   Hemoglobin 10.2 (L) 13.0 - 17.0 g/dL   HCT 30.9 (L) 39.0 - 52.0 %   MCV 84.7 80.0 - 100.0 fL   MCH 27.9 26.0 - 34.0 pg   MCHC 33.0 30.0 - 36.0 g/dL   RDW 13.2 11.5 - 15.5 %   Platelets 341 150 - 400 K/uL   nRBC 0.0 0.0 - 0.2 %  Protime-INR  Result Value Ref Range   Prothrombin Time 14.3 11.4 - 15.2 seconds   INR 1.2 0.8 - 1.2  CBC  Result Value Ref Range   WBC 7.3 4.0 - 10.5 K/uL   RBC 3.48 (L) 4.22 - 5.81 MIL/uL   Hemoglobin 9.8 (L) 13.0 - 17.0 g/dL   HCT 29.9 (L) 39.0 - 52.0 %   MCV 85.9 80.0 - 100.0 fL   MCH 28.2 26.0 - 34.0 pg   MCHC 32.8 30.0 - 36.0 g/dL   RDW 13.3 11.5 - 15.5 %   Platelets 306 150 - 400 K/uL   nRBC 0.0 0.0 - 0.2 %  Comprehensive metabolic panel  Result Value Ref Range   Sodium 141 135 - 145 mmol/L   Potassium 3.4 (L) 3.5 - 5.1 mmol/L   Chloride 109 98 - 111 mmol/L   CO2 25 22 - 32 mmol/L   Glucose, Bld 99 70 - 99 mg/dL   BUN 17 8 - 23 mg/dL   Creatinine, Ser 0.89 0.61 - 1.24 mg/dL   Calcium 8.3 (L) 8.9 - 10.3 mg/dL   Total Protein 5.8 (L) 6.5 - 8.1 g/dL   Albumin 3.1 (L) 3.5 - 5.0 g/dL   AST 24 15 - 41 U/L   ALT 16 0 - 44 U/L   Alkaline Phosphatase 51 38 - 126 U/L   Total Bilirubin 0.4 0.3 - 1.2 mg/dL   GFR, Estimated >60 >60 mL/min   Anion gap 7 5 - 15  CBC  Result Value Ref Range   WBC 5.4 4.0  - 10.5 K/uL   RBC 4.04 (L) 4.22 - 5.81 MIL/uL   Hemoglobin 11.2 (L) 13.0 - 17.0 g/dL   HCT 34.1 (L) 39.0 - 52.0 %   MCV 84.4 80.0 - 100.0 fL   MCH 27.7 26.0 - 34.0 pg   MCHC 32.8 30.0 - 36.0 g/dL   RDW 13.0 11.5 - 15.5 %   Platelets 296 150 - 400 K/uL   nRBC 0.0 0.0 - 0.2 %  Comprehensive metabolic panel  Result Value Ref Range   Sodium 138 135 - 145 mmol/L   Potassium 4.3 3.5 - 5.1 mmol/L   Chloride 102 98 - 111 mmol/L   CO2 24 22 - 32 mmol/L   Glucose, Bld 138 (H) 70 - 99 mg/dL   BUN 13 8 - 23 mg/dL   Creatinine, Ser 0.88 0.61 - 1.24 mg/dL   Calcium 8.8 (L) 8.9 - 10.3 mg/dL   Total Protein 6.6 6.5 - 8.1 g/dL   Albumin 3.7 3.5 - 5.0 g/dL   AST 27 15 -  41 U/L   ALT 27 0 - 44 U/L   Alkaline Phosphatase 60 38 - 126 U/L   Total Bilirubin 0.7 0.3 - 1.2 mg/dL   GFR, Estimated >60 >60 mL/min   Anion gap 12 5 - 15  Magnesium  Result Value Ref Range   Magnesium 2.6 (H) 1.7 - 2.4 mg/dL  CBC  Result Value Ref Range   WBC 6.8 4.0 - 10.5 K/uL   RBC 3.98 (L) 4.22 - 5.81 MIL/uL   Hemoglobin 11.2 (L) 13.0 - 17.0 g/dL   HCT 33.7 (L) 39.0 - 52.0 %   MCV 84.7 80.0 - 100.0 fL   MCH 28.1 26.0 - 34.0 pg   MCHC 33.2 30.0 - 36.0 g/dL   RDW 12.8 11.5 - 15.5 %   Platelets 318 150 - 400 K/uL   nRBC 0.0 0.0 - 0.2 %  Comprehensive metabolic panel  Result Value Ref Range   Sodium 137 135 - 145 mmol/L   Potassium 4.1 3.5 - 5.1 mmol/L   Chloride 105 98 - 111 mmol/L   CO2 23 22 - 32 mmol/L   Glucose, Bld 134 (H) 70 - 99 mg/dL   BUN 19 8 - 23 mg/dL   Creatinine, Ser 0.89 0.61 - 1.24 mg/dL   Calcium 8.4 (L) 8.9 - 10.3 mg/dL   Total Protein 6.1 (L) 6.5 - 8.1 g/dL   Albumin 3.3 (L) 3.5 - 5.0 g/dL   AST 25 15 - 41 U/L   ALT 31 0 - 44 U/L   Alkaline Phosphatase 52 38 - 126 U/L   Total Bilirubin 0.6 0.3 - 1.2 mg/dL   GFR, Estimated >60 >60 mL/min   Anion gap 9 5 - 15  Surgical pathology  Result Value Ref Range   SURGICAL PATHOLOGY      SURGICAL PATHOLOGY CASE: [id number] PATIENT:  Gwyndolyn Saxon Verhagen Surgical Pathology Report     Clinical History: Chronic mastoiditis of right side ([facility])     FINAL MICROSCOPIC DIAGNOSIS:  A. NASOPHARYNGEAL MASS, RIGHT, BIOPSY: - Inflamed sinonasal mucosa with reactive atypia. - PAS is negative for fungal organisms.  B. SPHENOID SINUS MUCOUS, BIOPSY: - Inflamed sinonasal mucosa. - PAS is negative for fungal organisms.  C. NASOPHARYNGEAL MASS, RIGHT #2, BIOPSY: - Inflamed sinonasal mucosa with ulcer and reactive atypia. - Rare yeast like forms on PAS.  D. NASOPHARYNGEAL MASS, RIGHT #3, BIOPSY: - Benign soft tissue with inflammatory exudate. - PAS is negative for fungal organisms.  E. NASOPHARYNGEAL MASS, RIGHT, BIOPSY: - Inflamed soft tissue. - PAS is negative for fungal organisms.  INTRAOPERATIVE DIAGNOSIS:  A. NASOPHARYNGEAL MASS, RIGHT, BIOPSY, FROZEN SECTION:      Inflamed mucosa with slight atypia.      Rapid intraoperative consult diagnosis rendere d by Dr. Saralyn Pilar @ [phone] 09/03/2020.  B. SPHENOID SINUS MUCOUS, BIOPSY, FROZEN SECTION:      Inflamed sinus mucosa.      Rapid intraoperative consult diagnosis rendered by Dr. Saralyn Pilar @ [phone] 09/03/2020.  C. NASOPHARYNGEAL MASS, RIGHT #2, BIOPSY, FROZEN SECTION:      Ulcer with atypia.      Rapid intraoperative consult diagnosis rendered by Dr. Saralyn Pilar @ 1711 09/03/2020.  D. NASOPHARYNGEAL MASS, RIGHT #3, BIOPSY, FROZEN SECTION:      Mucus and exudate.      Rapid intraoperative consult diagnosis rendered by Dr. Saralyn Pilar @ 1714 09/03/2020.    GROSS DESCRIPTION:  Specimen A: Received fresh labeled right nasopharyngeal mass (ID #1) for rapid intraoperative consult evaluation by frozen section is  a 0.5 x 0.4 x 0.2 cm aggregate of pink-red soft tissue/material, submitted in one block for frozen section.  Specimen B: Received fresh labeled sphenoid sinus mucosa (ID #2) for rapid intraoperative consult evaluation by frozen section is a 0.3 x 0.2 x 0.2 cm aggregate  of  pink-red soft tissue/material, submitted in one block for frozen section.  Specimen C: Received fresh labeled right nasopharyngeal mass #2 (ID #4) for rapid intraoperative consult evaluation by frozen section is a 0.6 x 0.6 x 0.3 cm aggregate of tan-pink to pale yellow soft to focally vaguely nodular tissue/material submitted in one block for frozen section.  Specimen D: Received fresh labeled right nasopharyngeal mass #3 (ID #5) for rapid intraoperative consult evaluation by frozen section is a 1.6 x 1.1 x 0.8 cm portion of pale yellow-gray soft to indurated tissue/material.  Representative sections are submitted in block one for frozen section, with remaining specimen submitted in block two for routine histology.  Specimen E: Received fresh labeled right nasopharyngeal mass (ID #3) are several irregular pieces of dark red soft tissue/material which range from minute fragments to 0.2 cm, and are 0.3 x 0.25 by less than 0.1 cm in aggregate.  Entirely submitted one b lock for routine histology.  SW 09/04/2020   Final Diagnosis performed by Vicente Males, MD.   Electronically signed 09/08/2020 Technical component performed at [facility], [place] [address], [place], [place] [address].  Professional component performed at [facility], [place] [address] [place], [place] [address].  Immunohistochemistry Technical component (if applicable) was performed at [facility]. [address] [place], [place], [place] [address].   IMMUNOHISTOCHEMISTRY DISCLAIMER (if applicable): Some of these immunohistochemical stains may have been developed and the performance characteristics determine by [facility]. Some may not have been cleared or approved by the U.S. Food and Drug Administration. The FDA has determined that such clearance or approval is not necessary. This test is used for clinical purposes. It should not be regarded as  investigational or for research. This laboratory is ce rtified under the Clinical Laboratory Improvement Amendments of 1988 (CLIA-88) as qualified to perform high complexity clinical laboratory testing.  The controls stained appropriately.       Assessment & Plan:   Problem List Items Addressed This Visit   None   Visit Diagnoses    Cellulitis of right ear    -  Primary   Will treat with bactrim. Recheck 2 weeks. Call with any concerns. Continue to monitor.        Follow up plan: Return in about 2 weeks (around 12/01/2020).

## 2020-11-18 ENCOUNTER — Encounter: Payer: Self-pay | Admitting: Emergency Medicine

## 2020-11-18 ENCOUNTER — Emergency Department: Payer: Medicare Other

## 2020-11-18 ENCOUNTER — Emergency Department
Admission: EM | Admit: 2020-11-18 | Discharge: 2020-11-18 | Disposition: A | Discharge status: Home / Self Care | Payer: Medicare Other | Attending: Emergency Medicine | Admitting: Emergency Medicine

## 2020-11-18 DIAGNOSIS — E039 Hypothyroidism, unspecified: Secondary | ICD-10-CM | POA: Insufficient documentation

## 2020-11-18 DIAGNOSIS — Z9221 Personal history of antineoplastic chemotherapy: Secondary | ICD-10-CM | POA: Insufficient documentation

## 2020-11-18 DIAGNOSIS — Z85818 Personal history of malignant neoplasm of other sites of lip, oral cavity, and pharynx: Secondary | ICD-10-CM | POA: Insufficient documentation

## 2020-11-18 DIAGNOSIS — I10 Essential (primary) hypertension: Secondary | ICD-10-CM | POA: Insufficient documentation

## 2020-11-18 DIAGNOSIS — Z85828 Personal history of other malignant neoplasm of skin: Secondary | ICD-10-CM | POA: Diagnosis not present

## 2020-11-18 DIAGNOSIS — I871 Compression of vein: Secondary | ICD-10-CM | POA: Diagnosis not present

## 2020-11-18 DIAGNOSIS — R1013 Epigastric pain: Secondary | ICD-10-CM | POA: Diagnosis not present

## 2020-11-18 DIAGNOSIS — R590 Localized enlarged lymph nodes: Secondary | ICD-10-CM | POA: Diagnosis not present

## 2020-11-18 DIAGNOSIS — R1310 Dysphagia, unspecified: Secondary | ICD-10-CM

## 2020-11-18 DIAGNOSIS — H6091 Unspecified otitis externa, right ear: Secondary | ICD-10-CM | POA: Diagnosis not present

## 2020-11-18 DIAGNOSIS — Z8522 Personal history of malignant neoplasm of nasal cavities, middle ear, and accessory sinuses: Secondary | ICD-10-CM | POA: Insufficient documentation

## 2020-11-18 DIAGNOSIS — J392 Other diseases of pharynx: Secondary | ICD-10-CM | POA: Diagnosis not present

## 2020-11-18 DIAGNOSIS — Z8603 Personal history of neoplasm of uncertain behavior: Secondary | ICD-10-CM | POA: Diagnosis not present

## 2020-11-18 DIAGNOSIS — H60501 Unspecified acute noninfective otitis externa, right ear: Secondary | ICD-10-CM

## 2020-11-18 DIAGNOSIS — Z79899 Other long term (current) drug therapy: Secondary | ICD-10-CM | POA: Insufficient documentation

## 2020-11-18 DIAGNOSIS — Z923 Personal history of irradiation: Secondary | ICD-10-CM | POA: Diagnosis not present

## 2020-11-18 DIAGNOSIS — Z85819 Personal history of malignant neoplasm of unspecified site of lip, oral cavity, and pharynx: Secondary | ICD-10-CM | POA: Diagnosis not present

## 2020-11-18 LAB — CBC
HCT: 34.6 % — ABNORMAL LOW (ref 39.0–52.0)
Hemoglobin: 11.8 g/dL — ABNORMAL LOW (ref 13.0–17.0)
MCH: 28.9 pg (ref 26.0–34.0)
MCHC: 34.1 g/dL (ref 30.0–36.0)
MCV: 84.6 fL (ref 80.0–100.0)
Platelets: 321 10*3/uL (ref 150–400)
RBC: 4.09 MIL/uL — ABNORMAL LOW (ref 4.22–5.81)
RDW: 12.4 % (ref 11.5–15.5)
WBC: 10.4 10*3/uL (ref 4.0–10.5)
nRBC: 0 % (ref 0.0–0.2)

## 2020-11-18 LAB — COMPREHENSIVE METABOLIC PANEL
ALT: 11 U/L (ref 0–44)
AST: 17 U/L (ref 15–41)
Albumin: 4.4 g/dL (ref 3.5–5.0)
Alkaline Phosphatase: 69 U/L (ref 38–126)
Anion gap: 11 (ref 5–15)
BUN: 10 mg/dL (ref 8–23)
CO2: 26 mmol/L (ref 22–32)
Calcium: 9.2 mg/dL (ref 8.9–10.3)
Chloride: 99 mmol/L (ref 98–111)
Creatinine, Ser: 1.01 mg/dL (ref 0.61–1.24)
GFR, Estimated: >60 mL/min (ref >60)
Glucose, Bld: 129 mg/dL — ABNORMAL HIGH (ref 70–99)
Potassium: 3.6 mmol/L (ref 3.5–5.1)
Sodium: 136 mmol/L (ref 135–145)
Total Bilirubin: 0.7 mg/dL (ref 0.3–1.2)
Total Protein: 7.6 g/dL (ref 6.5–8.1)

## 2020-11-18 MED ORDER — SODIUM CHLORIDE 0.9 % IV BOLUS
1000.0000 mL | Freq: Once | INTRAVENOUS | Status: AC
  Administered 2020-11-18: 1000 mL via INTRAVENOUS

## 2020-11-18 MED ORDER — LIDOCAINE VISCOUS HCL 2 % MT SOLN
15.0000 mL | Freq: Once | OROMUCOSAL | Status: AC
  Administered 2020-11-18: 15 mL via ORAL
  Filled 2020-11-18: qty 15

## 2020-11-18 MED ORDER — GI COCKTAIL ~~LOC~~: ORAL | 0 refills | Status: DC

## 2020-11-18 MED ORDER — OFLOXACIN 0.3 % OP SOLN: OPHTHALMIC | 0 refills | Status: DC

## 2020-11-18 MED ORDER — ALUM & MAG HYDROXIDE-SIMETH 200-200-20 MG/5ML PO SUSP
30.0000 mL | Freq: Once | ORAL | Status: AC
  Administered 2020-11-18: 30 mL via ORAL
  Filled 2020-11-18: qty 30

## 2020-11-18 MED ORDER — IOHEXOL 300 MG/ML  SOLN
75.0000 mL | Freq: Once | INTRAMUSCULAR | Status: AC | PRN
  Administered 2020-11-18: 75 mL via INTRAVENOUS

## 2020-11-18 NOTE — ED Notes (Signed)
RN Bri called and informed of pt coming to room

## 2020-11-18 NOTE — ED Provider Notes (Signed)
[facility] Emergency Department Provider Note  Time seen: 9:32 AM  I have reviewed the triage vital signs and the nursing notes.   HISTORY  Chief Complaint Dysphagia and ear pain   HPI Marcus Wright is a 65 y.o. male with a past medical history of hypertension, hyperlipidemia, throat cancer status post surgery/chemotherapy/radiation now in remission presents to the emergency department for difficulty swallowing.  According to the patient and daughter over the past 3 to 4 days patient has been experiencing pain to the right ear, was seen at his doctor yesterday who diagnosed him with a ear infection and placed the patient on Bactrim.  Patient took 1 dose last night.  States today he is having trouble swallowing.  It is not clear if that is due to pain or if he physically cannot swallow.  Denies any fever.  No vomiting.  Past Medical History:  Diagnosis Date  . Cancer [facility]) 2003   skin'  . Hyperlipidemia   . Hypertension   . Hypothyroidism   . Thyroid disorder   . Wears dentures    full upper  . Wears hearing aid     Patient Active Problem List   Diagnosis Date Noted  . 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 Dysphagia 09/01/2020  . Neck mass 09/01/2020  . Nasal abscess 08/31/2020  . Recurrent otitis externa, right 03/11/2020  . DJD (degenerative joint disease), thoracic 11/22/2018  . Mastoiditis of right side 06/22/2017  . Hearing loss 05/10/2017  . Chronic cluster headache 02/08/2017  . Arthritis 02/08/2017  . Bilateral hearing loss 01/24/2017  . Ear mass, right 01/24/2017  . Chronic otitis externa of right ear 09/23/2016  . Mixed conductive and sensorineural hearing loss of both ears 09/23/2016  . Chronic mucoid otitis media of both ears 08/26/2016  . 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 Colonic constipation   . Hyperlipidemia 05/21/2016  . Other specified disorders of Eustachian tube, unspecified ear  05/20/2016  . Encounter for fitting or adjustment of hearing aid 01/07/2016  . Chronic mucoid otitis media 11/28/2015  . History of sinus cancer   . Hypertension   . Thyroid disorder   . Dysphagia, pharyngoesophageal phase 07/31/2015  . Sensorineural hearing loss, bilateral 05/20/2015  . Allergic rhinitis due to pollen 07/04/2014  . Cervical radicular pain 02/07/2014  . History of neck surgery 02/07/2014  . Gynecomastia, male 06/20/2013    Past Surgical History:  Procedure Laterality Date  . BACK SURGERY  2011  . BREAST SURGERY Right 11-22-12  . CHOLECYSTECTOMY    . COLONOSCOPY    . COLONOSCOPY WITH PROPOFOL N/A 07/19/2016   Procedure: COLONOSCOPY WITH PROPOFOL;  Surgeon: Lucilla Lame, MD;  Location: [facility];  Service: Endoscopy;  Laterality: N/A;  . REPLACEMENT TOTAL KNEE Right 1993  . SINUS ENDO W/FUSION Right 09/03/2020   Procedure: Removal of Right nasopharyngeal mass, Biopsy and frozen sections;  Surgeon: Jason Coop, DO;  Location: [facility];  Service: ENT;  Laterality: Right;  . SINUS SURGERY WITH INSTATRAK  2003   cancer    Prior to Admission medications   Medication Sig Start Date End Date Taking? Authorizing Provider  amLODipine (NORVASC) 2.5 MG tablet Take 2.5 mg by mouth daily.    [provider]  levothyroxine (SYNTHROID) 50 MCG tablet TAKE 1 TABLET BY MOUTH EVERY DAY Patient taking differently: Take 50 mcg by mouth daily. 02/20/20   Johnson, Megan P, DO  meloxicam (MOBIC) 15 MG  tablet TAKE 1 TABLET BY MOUTH EVERY DAY Patient not taking: Reported on 11/17/2020 11/16/20   Park Liter P, DO  rosuvastatin (CRESTOR) 20 MG tablet Take 1 tablet (20 mg total) by mouth daily. 06/03/20   Johnson, Megan P, DO  sulfamethoxazole-trimethoprim (BACTRIM DS) 800-160 MG tablet Take 1 tablet by mouth 2 (two) times daily. 11/17/20   Park Liter P, DO    Allergies  Allergen Reactions  . Doxazosin Hives    Marcus     No family history on file.  Social  History Social History   Tobacco Use  . Smoking status: Former Research scientist (life sciences)  . Smokeless tobacco: Never Used  . Tobacco comment: quit 20+ yrs ago  Vaping Use  . Vaping Use: Never used  Substance Use Topics  . Alcohol use: Yes    Comment: on occasion, 1-2 beers or wine occ.   . Drug use: No    Review of Systems Constitutional: Negative for fever. ENT: Right ear pain, right throat/neck pain Cardiovascular: Negative for chest pain. Respiratory: Negative for shortness of breath. Gastrointestinal: Negative for abdominal pain, vomiting  Musculoskeletal: Negative for musculoskeletal complaints Neurological: Negative for headache All other ROS negative  ____________________________________________   PHYSICAL EXAM:  VITAL SIGNS: ED Triage Vitals [11/18/20 0921]  Enc Vitals Group     BP (!) 86/70     Pulse Rate 82     Resp 18     Temp 98.3 F (36.8 C)     Temp Source Oral     SpO2 100 %     Weight      Height      Head Circumference      Peak Flow      Pain Score      Pain Loc      Pain Edu?      Excl. in [place]?     Constitutional: Alert. Well appearing and in no distress. Eyes: Normal exam ENT      Head: Normocephalic and atraumatic.  Patient has tenderness to the right ear, on examination has swelling to the auditory canal most consistent with otitis externa.  No redness or tenderness over the mastoid process.      Mouth/Throat: Mucous membranes are moist. Cardiovascular: Normal rate, regular rhythm. Respiratory: Normal respiratory effort without tachypnea nor retractions. Breath sounds are clear Gastrointestinal: Soft and nontender. No distention. Musculoskeletal: Nontender with normal range of motion in all extremities. Neurologic: Difficulty speaking due to past throat surgery and radiation, baseline. No gross focal neurologic deficits Skin:  Skin is warm, dry and intact.  Psychiatric: Mood and affect are normal.   ____________________________________________     EKG  EKG viewed and interpreted by myself shows a normal sinus rhythm at 80 bpm with a narrow QRS, normal axis, normal intervals, no concerning ST changes.  ____________________________________________    RADIOLOGY  IMPRESSION:  Similar right posterior nasopharyngeal mass with extension into the  carotid space and to the skull base. There may be some involvement  of the right masticator space. Extent of involvement for staging  would be better evaluated on MRI.   Associated right mastoid and middle ear opacification as before.  There is new right external auditory canal soft tissue thickening  and opacification, which may reflect otitis externa.   Increase in size of right level 1B node. Stable mildly enlarged left  level 1B node. Similar additional nonenlarged bilateral cervical  lymph nodes.   Increased, now complete effacement of the right internal jugular  vein  through the area of mass. Stable caliber of the cervical  internal carotid.   ____________________________________________   INITIAL IMPRESSION / ASSESSMENT AND PLAN / ED COURSE  Pertinent labs & imaging results that were available during my care of the patient were reviewed by me and considered in my medical decision making (see chart for details).   Patient presents to the emergency department for right ear pain, right throat/neck pain and difficulty swallowing.  Patient does have tenderness over the right neck.  Patient has a patent oropharynx on exam with no edema noted.  Does appear to have an otitis externa and was prescribed Bactrim, we will place the patient on antibiotic eardrops.  Is not clear the patient cannot swallow due to discomfort or if he physically cannot do it.  We will have the patient gargle a GI cocktail and swallow.  We will check labs and likely obtain a CT scan of the neck given the patient's history.  CT scan shows slightly increased size of mass and nodes.  Patient follows up with ENT,  patient has had multiple recent biopsies that have been negative for oncologic process.  I did discuss given the CT findings that he should follow-up with his ENT again.  Daughter states they will call to make an appointment.  After a GI cocktail patient is swallowing water, states it feels better.  We will discharged with a prescription for GI cocktails as well as eardrops for otitis externa.  Patient agreeable to plan of care.  Marcus Wright was evaluated in Emergency Department on 11/18/2020 for the symptoms described in the history of present illness. He was evaluated in the context of the global COVID-19 pandemic, which necessitated consideration that the patient might be at risk for infection with the SARS-CoV-2 virus that causes COVID-19. Institutional protocols and algorithms that pertain to the evaluation of patients at risk for COVID-19 are in a state of rapid change based on information released by regulatory bodies including the CDC and federal and state organizations. These policies and algorithms were followed during the patient's care in the ED.  ____________________________________________   FINAL CLINICAL IMPRESSION(S) / ED DIAGNOSES  Right otitis externa Dysphagia   Harvest Dark, MD 11/18/20 [phone]

## 2020-11-18 NOTE — ED Notes (Signed)
See triage note. Pt with family member, reports recently dx with right ear infection and started on PO meds. C/o difficulty swallowing, able to swallow saliva. Able to speak however voice does sound hoarse. No swelling noted. Hx throat cancer.  NAD noted, RR Even and unlabored  EDP Paduchowski at bedside

## 2020-11-18 NOTE — Discharge Instructions (Addendum)
As we discussed your CT findings show a slight increase in size of the lymph node and mass in your neck.  Please follow-up with your ENT by calling the number provided to arrange a follow-up appointment within the next 1 week for recheck/reevaluation.  You have been prescribed a GI cocktail, you may gargle and swallow this twice daily if needed for trouble swallowing or pain with swallowing.  Please begin using your antibiotic eardrop for your likely outer ear infection.  Return to the emergency department if you are unable to swallow, or any other symptom personally concerning to yourself.

## 2020-11-18 NOTE — ED Notes (Signed)
[facility] called and there are no directions on gi cocktail.  Per dr Cherylann Banas can take 30 ml every 8 hours as needed.

## 2020-11-18 NOTE — ED Triage Notes (Signed)
Pt comes with family with c/o ear pain. Pt was dx with ear infection yesterday. Pt prescribed medications and then woke up this am and not able to swallow.  Pt has hx of lung cancer. Pt unable to speak clearly.  Pt taken straight to ED 5 at this time

## 2020-11-27 ENCOUNTER — Ambulatory Visit
Admission: RE | Admit: 2020-11-27 | Discharge: 2020-11-27 | Disposition: A | Discharge status: Home / Self Care | Payer: Medicare Other | Source: Ambulatory Visit | Attending: Otolaryngology | Admitting: Otolaryngology

## 2020-11-27 ENCOUNTER — Other Ambulatory Visit: Payer: Self-pay

## 2020-11-27 DIAGNOSIS — J392 Other diseases of pharynx: Secondary | ICD-10-CM | POA: Insufficient documentation

## 2020-11-27 DIAGNOSIS — R590 Localized enlarged lymph nodes: Secondary | ICD-10-CM | POA: Diagnosis not present

## 2020-12-01 ENCOUNTER — Encounter: Payer: Self-pay | Admitting: Family Medicine

## 2020-12-01 ENCOUNTER — Other Ambulatory Visit: Payer: Self-pay

## 2020-12-01 ENCOUNTER — Ambulatory Visit (INDEPENDENT_AMBULATORY_CARE_PROVIDER_SITE_OTHER): Payer: Medicare Other | Admitting: Family Medicine

## 2020-12-01 VITALS — BP 108/74 | HR 76 | Temp 97.7°F | Wt 163.2 lb

## 2020-12-01 DIAGNOSIS — J392 Other diseases of pharynx: Secondary | ICD-10-CM | POA: Diagnosis not present

## 2020-12-01 DIAGNOSIS — R231 Pallor: Secondary | ICD-10-CM

## 2020-12-01 DIAGNOSIS — S41101A Unspecified open wound of right upper arm, initial encounter: Secondary | ICD-10-CM

## 2020-12-01 DIAGNOSIS — H6011 Cellulitis of right external ear: Secondary | ICD-10-CM

## 2020-12-01 MED ORDER — DOXYCYCLINE HYCLATE 100 MG PO TABS: 100.0000 mg | ORAL_TABLET | Freq: Two times a day (BID) | ORAL | 0 refills | Status: DC

## 2020-12-01 NOTE — Assessment & Plan Note (Signed)
Not concerned for otitis media- appears to have his regular discharge more consistent with radiation damage than pus, however his auricle is quite red and hot and swollen. Concern for cellulitis of R ear. Bactrim caused him to have dysphagia- unclear if this is due to the bactrim or due to the swelling from his ear/nasopharyngeal mass. We will see about getting him back into ENT- needs fine needle aspiration. Called over to his ENT today ans were not able to get through- will send copy of today's note to her. Continue to monitor closely.

## 2020-12-01 NOTE — Assessment & Plan Note (Signed)
Needs FNA with his ENT. I cannot see that this has been scheduled. We will reach out to his ENT and try to get him in ASAP. Unclear if his dysphagia was due to his bactrim or due to the nasopharyngeal mass. Call with any concerns.

## 2020-12-01 NOTE — Progress Notes (Signed)
BP 108/74   Pulse 76   Temp 97.7 F (36.5 C)   Wt 163 lb 3.2 oz (74 kg)   SpO2 97%   BMI 22.13 kg/m    Subjective:    Patient ID: Marcus Wright, male    DOB: 1956-06-06, 65 y.o.   MRN: [id number]  HPI: Marcus Wright is a 65 y.o. male  Chief Complaint  Patient presents with  . Cellulitis    Follow up on ear, patient states pain is still there. Went to ER for difficulty swallowing, was taken off bactrim and put on ear drops    Not feeling well. Having a lot of body aches. He notes that he took a bactrim and it made him not be able to swallow. He has had pus coming out of his ear into his throat. His swallowing hs been doing a little better. He has been using ear drops, but his ear continues to hurt. It has been red and swollen and irritated. He has not heard about his fine needle aspiration of the lymph nodes yet. He is not feeling well.   He notes that he has a spot on his R arm that has been there for 3-6 months, it keeps opening back up again and is not healing.  Notes from ER reviewed in detail today.  Relevant past medical, surgical, family and social history reviewed and updated as indicated. Interim medical history since our last visit reviewed. Allergies and medications reviewed and updated.  Review of Systems  HENT: Positive for ear discharge and hearing loss. Negative for congestion, dental problem, drooling, ear pain, facial swelling, mouth sores, nosebleeds, postnasal drip, rhinorrhea, sinus pressure, sinus pain, sneezing, sore throat, tinnitus, trouble swallowing and voice change.   Respiratory: Negative.   Cardiovascular: Negative.   Gastrointestinal: Negative.   Musculoskeletal: Positive for myalgias. Negative for arthralgias, back pain, gait problem, joint swelling, neck pain and neck stiffness.  Skin: Positive for pallor. Negative for color change, rash and wound.    Per HPI unless specifically indicated above     Objective:    BP 108/74   Pulse  76   Temp 97.7 F (36.5 C)   Wt 163 lb 3.2 oz (74 kg)   SpO2 97%   BMI 22.13 kg/m   Wt Readings from Last 3 Encounters:  12/01/20 163 lb 3.2 oz (74 kg)  11/17/20 164 lb 6.4 oz (74.6 kg)  09/05/20 163 lb 2.3 oz (74 kg)    Physical Exam Vitals and nursing note reviewed.  Constitutional:      General: He is not in acute distress.    Appearance: Normal appearance. He is not ill-appearing, toxic-appearing or diaphoretic.  HENT:     Head: Normocephalic and atraumatic.     Right Ear: External ear normal.     Left Ear: External ear normal.     Nose: Nose normal.     Mouth/Throat:     Mouth: Mucous membranes are moist.     Pharynx: Oropharynx is clear.  Eyes:     General: No scleral icterus.       Right eye: No discharge.        Left eye: No discharge.     Extraocular Movements: Extraocular movements intact.     Conjunctiva/sclera: Conjunctivae normal.     Pupils: Pupils are equal, round, and reactive to light.  Cardiovascular:     Rate and Rhythm: Normal rate and regular rhythm.     Pulses: Normal pulses.  Heart sounds: Normal heart sounds. No murmur heard. No friction rub. No gallop.   Pulmonary:     Effort: Pulmonary effort is normal. No respiratory distress.     Breath sounds: Normal breath sounds. No stridor. No wheezing, rhonchi or rales.  Chest:     Chest wall: No tenderness.  Musculoskeletal:        General: Normal range of motion.     Cervical back: Normal range of motion and neck supple.  Skin:    General: Skin is warm and dry.     Capillary Refill: Capillary refill takes less than 2 seconds.     Coloration: Skin is not jaundiced or pale.     Findings: No bruising, erythema, lesion or rash.  Neurological:     General: No focal deficit present.     Mental Status: He is alert and oriented to person, place, and time. Mental status is at baseline.  Psychiatric:        Mood and Affect: Mood normal.        Behavior: Behavior normal.        Thought Content:  Thought content normal.        Judgment: Judgment normal.     Results for orders placed or performed during the hospital encounter of 11/18/20  CBC  Result Value Ref Range   WBC 10.4 4.0 - 10.5 K/uL   RBC 4.09 (L) 4.22 - 5.81 MIL/uL   Hemoglobin 11.8 (L) 13.0 - 17.0 g/dL   HCT 34.6 (L) 39.0 - 52.0 %   MCV 84.6 80.0 - 100.0 fL   MCH 28.9 26.0 - 34.0 pg   MCHC 34.1 30.0 - 36.0 g/dL   RDW 12.4 11.5 - 15.5 %   Platelets 321 150 - 400 K/uL   nRBC 0.0 0.0 - 0.2 %  Comprehensive metabolic panel  Result Value Ref Range   Sodium 136 135 - 145 mmol/L   Potassium 3.6 3.5 - 5.1 mmol/L   Chloride 99 98 - 111 mmol/L   CO2 26 22 - 32 mmol/L   Glucose, Bld 129 (H) 70 - 99 mg/dL   BUN 10 8 - 23 mg/dL   Creatinine, Ser 1.01 0.61 - 1.24 mg/dL   Calcium 9.2 8.9 - 10.3 mg/dL   Total Protein 7.6 6.5 - 8.1 g/dL   Albumin 4.4 3.5 - 5.0 g/dL   AST 17 15 - 41 U/L   ALT 11 0 - 44 U/L   Alkaline Phosphatase 69 38 - 126 U/L   Total Bilirubin 0.7 0.3 - 1.2 mg/dL   GFR, Estimated >60 >60 mL/min   Anion gap 11 5 - 15      Assessment & Plan:   Problem List Items Addressed This Visit      Nervous and Auditory   Cellulitis of right ear - Primary    Not concerned for otitis media- appears to have his regular discharge more consistent with radiation damage than pus, however his auricle is quite red and hot and swollen. Concern for cellulitis of R ear. Bactrim caused him to have dysphagia- unclear if this is due to the bactrim or due to the swelling from his ear/nasopharyngeal mass. We will see about getting him back into ENT- needs fine needle aspiration. Called over to his ENT today ans were not able to get through- will send copy of today's note to her. Continue to monitor closely.         Other   Nasopharyngeal mass  Needs FNA with his ENT. I cannot see that this has been scheduled. We will reach out to his ENT and try to get him in ASAP. Unclear if his dysphagia was due to his bactrim or due to  the nasopharyngeal mass. Call with any concerns.      Relevant Orders   Comprehensive metabolic panel    Other Visit Diagnoses    Pallor       Patient is significantly more pale than usual today. Checking labs. Await results.    Relevant Orders   CBC with Differential/Platelet   Comprehensive metabolic panel   Non-healing wound of right upper extremity       Will get him back for shave biopsy. Await results.        Follow up plan: Return 1week with Lauren- ear recheck 2 weeks shave biopsy.    >25 minutes spent with patient today.

## 2020-12-02 ENCOUNTER — Other Ambulatory Visit: Payer: Self-pay | Admitting: Otolaryngology

## 2020-12-02 DIAGNOSIS — L040 Acute lymphadenitis of face, head and neck: Secondary | ICD-10-CM

## 2020-12-02 LAB — CBC WITH DIFFERENTIAL/PLATELET
Basophils Absolute: 0.1 10*3/uL (ref 0.0–0.2)
Basos: 1 %
EOS (ABSOLUTE): 0.2 10*3/uL (ref 0.0–0.4)
Eos: 3 %
Hematocrit: 33.2 % — ABNORMAL LOW (ref 37.5–51.0)
Hemoglobin: 11.1 g/dL — ABNORMAL LOW (ref 13.0–17.7)
Immature Grans (Abs): 0 10*3/uL (ref 0.0–0.1)
Immature Granulocytes: 0 %
Lymphocytes Absolute: 2.2 10*3/uL (ref 0.7–3.1)
Lymphs: 31 %
MCH: 28.1 pg (ref 26.6–33.0)
MCHC: 33.4 g/dL (ref 31.5–35.7)
MCV: 84 fL (ref 79–97)
Monocytes Absolute: 0.7 10*3/uL (ref 0.1–0.9)
Monocytes: 10 %
Neutrophils Absolute: 3.8 10*3/uL (ref 1.4–7.0)
Neutrophils: 55 %
Platelets: 391 10*3/uL (ref 150–450)
RBC: 3.95 x10E6/uL — ABNORMAL LOW (ref 4.14–5.80)
RDW: 12 % (ref 11.6–15.4)
WBC: 7 10*3/uL (ref 3.4–10.8)

## 2020-12-02 LAB — COMPREHENSIVE METABOLIC PANEL
ALT: 16 IU/L (ref 0–44)
AST: 19 IU/L (ref 0–40)
Albumin/Globulin Ratio: 1.8 (ref 1.2–2.2)
Albumin: 4.4 g/dL (ref 3.8–4.8)
Alkaline Phosphatase: 88 IU/L (ref 44–121)
BUN/Creatinine Ratio: 7 — ABNORMAL LOW (ref 10–24)
BUN: 7 mg/dL — ABNORMAL LOW (ref 8–27)
Bilirubin Total: 0.3 mg/dL (ref 0.0–1.2)
CO2: 26 mmol/L (ref 20–29)
Calcium: 9.3 mg/dL (ref 8.6–10.2)
Chloride: 97 mmol/L (ref 96–106)
Creatinine, Ser: 0.96 mg/dL (ref 0.76–1.27)
Globulin, Total: 2.5 g/dL (ref 1.5–4.5)
Glucose: 81 mg/dL (ref 65–99)
Potassium: 4.4 mmol/L (ref 3.5–5.2)
Sodium: 137 mmol/L (ref 134–144)
Total Protein: 6.9 g/dL (ref 6.0–8.5)
eGFR: 87 mL/min/{1.73_m2} (ref >59)

## 2020-12-04 ENCOUNTER — Other Ambulatory Visit: Payer: Self-pay | Admitting: Student

## 2020-12-05 ENCOUNTER — Other Ambulatory Visit: Payer: Self-pay

## 2020-12-05 ENCOUNTER — Ambulatory Visit
Admission: RE | Admit: 2020-12-05 | Discharge: 2020-12-05 | Disposition: A | Discharge status: Home / Self Care | Payer: Medicare Other | Source: Ambulatory Visit | Attending: Otolaryngology | Admitting: Otolaryngology

## 2020-12-05 DIAGNOSIS — C770 Secondary and unspecified malignant neoplasm of lymph nodes of head, face and neck: Secondary | ICD-10-CM | POA: Diagnosis not present

## 2020-12-05 DIAGNOSIS — Z923 Personal history of irradiation: Secondary | ICD-10-CM | POA: Diagnosis not present

## 2020-12-05 DIAGNOSIS — L040 Acute lymphadenitis of face, head and neck: Secondary | ICD-10-CM

## 2020-12-05 DIAGNOSIS — R591 Generalized enlarged lymph nodes: Secondary | ICD-10-CM | POA: Diagnosis not present

## 2020-12-05 DIAGNOSIS — R590 Localized enlarged lymph nodes: Secondary | ICD-10-CM | POA: Diagnosis not present

## 2020-12-05 MED ORDER — SODIUM CHLORIDE 0.9 % IV SOLN: INTRAVENOUS | Status: DC

## 2020-12-05 NOTE — Discharge Instructions (Signed)

## 2020-12-05 NOTE — Procedures (Signed)
Interventional Radiology Procedure Note  Procedure: Lymph node biopsy  Indication: Enlarged right submandibular LN  Findings: Please refer to procedural dictation for full description.  Complications: None  EBL: < 10 mL  Miachel Roux, MD [phone]

## 2020-12-05 NOTE — OR Nursing (Signed)
Verbally reviewed discharge instructions (currently printer out of service) but given generic paper copy of thyroid biopsy discharge instructions.

## 2020-12-10 ENCOUNTER — Ambulatory Visit: Payer: Medicare Other | Admitting: Nurse Practitioner

## 2020-12-10 LAB — SURGICAL PATHOLOGY

## 2020-12-11 ENCOUNTER — Ambulatory Visit (INDEPENDENT_AMBULATORY_CARE_PROVIDER_SITE_OTHER): Payer: Medicare Other | Admitting: Nurse Practitioner

## 2020-12-11 ENCOUNTER — Encounter: Payer: Self-pay | Admitting: Nurse Practitioner

## 2020-12-11 ENCOUNTER — Other Ambulatory Visit: Payer: Self-pay

## 2020-12-11 VITALS — BP 90/54 | HR 77 | Temp 98.2°F | Wt 167.4 lb

## 2020-12-11 DIAGNOSIS — I10 Essential (primary) hypertension: Secondary | ICD-10-CM

## 2020-12-11 DIAGNOSIS — H6011 Cellulitis of right external ear: Secondary | ICD-10-CM

## 2020-12-11 NOTE — Patient Instructions (Signed)
It was great to see you!  Stop your amlodipine, your blood pressure has been on the low side. Keep monitoring it at home for the next week and we will check it at your next visit. Your ear looks a lot better! Finish the doxycycline.   Take care,  Vance Peper, NP

## 2020-12-11 NOTE — Assessment & Plan Note (Signed)
BP 90/54 today after recheck. Is not checking BP at home but does have monitor. Denies any symptoms of hypotension. Will stop amlodipine and have him check his BP at home. Has appointment scheduled 12/23/20.

## 2020-12-11 NOTE — Progress Notes (Signed)
Established Patient Office Visit  Subjective:  Patient ID: Marcus Wright, male    DOB: [birth year]  Age: 65 y.o. MRN: [id number]  CC:  Chief Complaint  Patient presents with  . Ear Recheck    Pt states his ear is still hurting some but much better than it was. Still on Doxy and using drops.     HPI Marcus Wright presents for follow-up on right ear cellulitis. He states that his right ear redness has improved and is less painful. He is only noticing swelling in his lymph nodes near his right ear intermittently that are sore. Currently they are not swollen. Denies fevers and itching. He states his last dose of doxycycline is tomorrow.    Past Medical History:  Diagnosis Date  . Cancer [facility]) 2003   skin'  . Hyperlipidemia   . Hypertension   . Hypothyroidism   . Thyroid disorder   . Wears dentures    full upper  . Wears hearing aid     Past Surgical History:  Procedure Laterality Date  . BACK SURGERY  2011  . BREAST SURGERY Right 11-22-12  . CHOLECYSTECTOMY    . COLONOSCOPY    . COLONOSCOPY WITH PROPOFOL N/A 07/19/2016   Procedure: COLONOSCOPY WITH PROPOFOL;  Surgeon: Lucilla Lame, MD;  Location: [place];  Service: Endoscopy;  Laterality: N/A;  . REPLACEMENT TOTAL KNEE Right 1993  . SINUS ENDO W/FUSION Right 09/03/2020   Procedure: Removal of Right nasopharyngeal mass, Biopsy and frozen sections;  Surgeon: Jason Coop, DO;  Location: [place];  Service: ENT;  Laterality: Right;  . SINUS SURGERY WITH INSTATRAK  2003   cancer    History reviewed. No pertinent family history.  Social History   Socioeconomic History  . Marital status: Widowed    Spouse name: Not on file  . Number of children: Not on file  . Years of education: Not on file  . Highest education level: Not on file  Occupational History  . Not on file  Tobacco Use  . Smoking status: Former Research scientist (life sciences)  . Smokeless tobacco: Never Used  . Tobacco comment: quit 20+ yrs ago  Vaping Use   . Vaping Use: Never used  Substance and Sexual Activity  . Alcohol use: Yes    Comment: on occasion, 1-2 beers or wine occ.   . Drug use: No  . Sexual activity: Not Currently  Other Topics Concern  . Not on file  Social History Narrative  . Not on file   Social Determinants of Health   Financial Resource Strain: Not on file  Food Insecurity: Not on file  Transportation Needs: Not on file  Physical Activity: Not on file  Stress: Not on file  Social Connections: Not on file  Intimate Partner Violence: Not on file    Outpatient Medications Prior to Visit  Medication Sig Dispense Refill  . amLODipine (NORVASC) 2.5 MG tablet Take 2.5 mg by mouth daily.    Marland Kitchen doxycycline (VIBRA-TABS) 100 MG tablet Take 1 tablet (100 mg total) by mouth 2 (two) times daily. 20 tablet 0  . levothyroxine (SYNTHROID) 50 MCG tablet TAKE 1 TABLET BY MOUTH EVERY DAY (Patient taking differently: Take 50 mcg by mouth daily.) 90 tablet 3  . ofloxacin (OCUFLOX) 0.3 % ophthalmic solution Place 3 drops in right ear 3 times daily x 7 days 10 mL 0  . rosuvastatin (CRESTOR) 20 MG tablet Take 1 tablet (20 mg total) by mouth daily. 90 tablet 1  .  Alum & Mag Hydroxide-Simeth (GI COCKTAIL) SUSP suspension Shake well. Each dose to containe 63mL maalox and 65mL viscous lidocaine (Patient not taking: Reported on 12/11/2020) 300 mL 0  . meloxicam (MOBIC) 15 MG tablet TAKE 1 TABLET BY MOUTH EVERY DAY (Patient not taking: No sig reported) 90 tablet 1   No facility-administered medications prior to visit.    Allergies  Allergen Reactions  . Doxazosin Hives    Unknown     ROS Review of Systems  Constitutional: Negative for fatigue and fever.  HENT: Positive for ear pain (improved) and hearing loss.   Respiratory: Negative.   Cardiovascular: Negative.   Skin: Negative.   Neurological: Negative for dizziness and light-headedness.      Objective:    Physical Exam Vitals and nursing note reviewed.  Constitutional:       General: He is not in acute distress.    Appearance: Normal appearance.  HENT:     Head: Normocephalic.     Right Ear: Drainage (small amount in ear canal) present.     Left Ear: Ear canal and external ear normal.     Ears:     Comments: Right ear auricle significantly improved, minimal erythema and edema. Hearing aid to left ear. Cardiovascular:     Rate and Rhythm: Normal rate and regular rhythm.     Pulses: Normal pulses.     Heart sounds: Normal heart sounds.  Pulmonary:     Effort: Pulmonary effort is normal.     Breath sounds: Normal breath sounds.  Musculoskeletal:     Cervical back: Normal range of motion.  Skin:    General: Skin is warm and dry.  Neurological:     General: No focal deficit present.     Mental Status: He is alert and oriented to person, place, and time.  Psychiatric:        Mood and Affect: Mood normal.        Behavior: Behavior normal.        Thought Content: Thought content normal.        Judgment: Judgment normal.     BP (!) 90/54 (BP Location: Left Arm, Patient Position: Sitting)   Pulse 77   Temp 98.2 F (36.8 C) (Oral)   Wt 167 lb 6.4 oz (75.9 kg)   SpO2 98%   BMI 22.09 kg/m  Wt Readings from Last 3 Encounters:  12/11/20 167 lb 6.4 oz (75.9 kg)  12/05/20 165 lb (74.8 kg)  12/01/20 163 lb 3.2 oz (74 kg)     Health Maintenance Due  Topic Date Due  . PNA vac Low Risk Adult (2 of 2 - PCV13) 11/28/2020    There are no preventive care reminders to display for this patient.   Lab Results  Component Value Date   TSH 0.906 06/03/2020   Lab Results  Component Value Date   WBC 7.0 12/01/2020   HGB 11.1 (L) 12/01/2020   HCT 33.2 (L) 12/01/2020   MCV 84 12/01/2020   PLT 391 12/01/2020   Lab Results  Component Value Date   NA 137 12/01/2020   K 4.4 12/01/2020   CO2 26 12/01/2020   GLUCOSE 81 12/01/2020   BUN 7 (L) 12/01/2020   CREATININE 0.96 12/01/2020   BILITOT 0.3 12/01/2020   ALKPHOS 88 12/01/2020   AST 19 12/01/2020    ALT 16 12/01/2020   PROT 6.9 12/01/2020   ALBUMIN 4.4 12/01/2020   CALCIUM 9.3 12/01/2020   ANIONGAP 11 11/18/2020  Lab Results  Component Value Date   CHOL 138 06/03/2020   Lab Results  Component Value Date   HDL 42 06/03/2020   Lab Results  Component Value Date   LDLCALC 70 06/03/2020   Lab Results  Component Value Date   TRIG 150 (H) 06/03/2020      Assessment & Plan:   Problem List Items Addressed This Visit      Cardiovascular and Mediastinum   Hypertension    BP 90/54 today after recheck. Is not checking BP at home but does have monitor. Denies any symptoms of hypotension. Will stop amlodipine and have him check his BP at home. Has appointment scheduled 12/23/20.         Nervous and Auditory   Cellulitis of right ear - Primary    Significantly improved with doxycycline and ocuflox. Having mild pain intermittently at pre and post auricular lymph node region. No lymph nodes palpated today. Follow-up if symptoms worsen or fail to improve. Finish doxycycline course.          No orders of the defined types were placed in this encounter.   Follow-up: Return if symptoms worsen or fail to improve, for keep appointment at end of March.    Charyl Dancer, NP

## 2020-12-11 NOTE — Assessment & Plan Note (Signed)
Significantly improved with doxycycline and ocuflox. Having mild pain intermittently at pre and post auricular lymph node region. No lymph nodes palpated today. Follow-up if symptoms worsen or fail to improve. Finish doxycycline course.

## 2020-12-19 ENCOUNTER — Ambulatory Visit: Payer: Medicare Other | Admitting: Family Medicine

## 2020-12-19 ENCOUNTER — Emergency Department: Payer: Medicare Other

## 2020-12-19 ENCOUNTER — Emergency Department
Admission: EM | Admit: 2020-12-19 | Discharge: 2020-12-19 | Disposition: A | Discharge status: Home / Self Care | Payer: Medicare Other | Attending: Emergency Medicine | Admitting: Emergency Medicine

## 2020-12-19 ENCOUNTER — Other Ambulatory Visit: Payer: Self-pay

## 2020-12-19 DIAGNOSIS — R221 Localized swelling, mass and lump, neck: Secondary | ICD-10-CM | POA: Diagnosis not present

## 2020-12-19 DIAGNOSIS — Z85828 Personal history of other malignant neoplasm of skin: Secondary | ICD-10-CM | POA: Insufficient documentation

## 2020-12-19 DIAGNOSIS — Z20822 Contact with and (suspected) exposure to covid-19: Secondary | ICD-10-CM | POA: Diagnosis not present

## 2020-12-19 DIAGNOSIS — Z87891 Personal history of nicotine dependence: Secondary | ICD-10-CM | POA: Diagnosis not present

## 2020-12-19 DIAGNOSIS — J392 Other diseases of pharynx: Secondary | ICD-10-CM

## 2020-12-19 DIAGNOSIS — I6529 Occlusion and stenosis of unspecified carotid artery: Secondary | ICD-10-CM | POA: Diagnosis not present

## 2020-12-19 DIAGNOSIS — K118 Other diseases of salivary glands: Secondary | ICD-10-CM | POA: Diagnosis not present

## 2020-12-19 DIAGNOSIS — Z96651 Presence of right artificial knee joint: Secondary | ICD-10-CM | POA: Diagnosis not present

## 2020-12-19 DIAGNOSIS — C119 Malignant neoplasm of nasopharynx, unspecified: Secondary | ICD-10-CM | POA: Diagnosis not present

## 2020-12-19 DIAGNOSIS — J029 Acute pharyngitis, unspecified: Secondary | ICD-10-CM | POA: Diagnosis not present

## 2020-12-19 DIAGNOSIS — E039 Hypothyroidism, unspecified: Secondary | ICD-10-CM | POA: Diagnosis not present

## 2020-12-19 DIAGNOSIS — Z79899 Other long term (current) drug therapy: Secondary | ICD-10-CM | POA: Diagnosis not present

## 2020-12-19 DIAGNOSIS — R0989 Other specified symptoms and signs involving the circulatory and respiratory systems: Secondary | ICD-10-CM | POA: Diagnosis not present

## 2020-12-19 DIAGNOSIS — I10 Essential (primary) hypertension: Secondary | ICD-10-CM | POA: Diagnosis not present

## 2020-12-19 LAB — CBC WITH DIFFERENTIAL/PLATELET
Abs Immature Granulocytes: 0.01 10*3/uL (ref 0.00–0.07)
Basophils Absolute: 0.1 10*3/uL (ref 0.0–0.1)
Basophils Relative: 1 %
Eosinophils Absolute: 0.2 10*3/uL (ref 0.0–0.5)
Eosinophils Relative: 2 %
HCT: 34.4 % — ABNORMAL LOW (ref 39.0–52.0)
Hemoglobin: 11.6 g/dL — ABNORMAL LOW (ref 13.0–17.0)
Immature Granulocytes: 0 %
Lymphocytes Relative: 32 %
Lymphs Abs: 2.7 10*3/uL (ref 0.7–4.0)
MCH: 28.3 pg (ref 26.0–34.0)
MCHC: 33.7 g/dL (ref 30.0–36.0)
MCV: 83.9 fL (ref 80.0–100.0)
Monocytes Absolute: 0.6 10*3/uL (ref 0.1–1.0)
Monocytes Relative: 7 %
Neutro Abs: 4.9 10*3/uL (ref 1.7–7.7)
Neutrophils Relative %: 58 %
Platelets: 257 10*3/uL (ref 150–400)
RBC: 4.1 MIL/uL — ABNORMAL LOW (ref 4.22–5.81)
RDW: 12.8 % (ref 11.5–15.5)
WBC: 8.4 10*3/uL (ref 4.0–10.5)
nRBC: 0 % (ref 0.0–0.2)

## 2020-12-19 LAB — COMPREHENSIVE METABOLIC PANEL
ALT: 18 U/L (ref 0–44)
AST: 24 U/L (ref 15–41)
Albumin: 4.7 g/dL (ref 3.5–5.0)
Alkaline Phosphatase: 63 U/L (ref 38–126)
Anion gap: 9 (ref 5–15)
BUN: 11 mg/dL (ref 8–23)
CO2: 27 mmol/L (ref 22–32)
Calcium: 9.6 mg/dL (ref 8.9–10.3)
Chloride: 100 mmol/L (ref 98–111)
Creatinine, Ser: 0.81 mg/dL (ref 0.61–1.24)
GFR, Estimated: >60 mL/min (ref >60)
Glucose, Bld: 120 mg/dL — ABNORMAL HIGH (ref 70–99)
Potassium: 3.8 mmol/L (ref 3.5–5.1)
Sodium: 136 mmol/L (ref 135–145)
Total Bilirubin: 0.6 mg/dL (ref 0.3–1.2)
Total Protein: 7.3 g/dL (ref 6.5–8.1)

## 2020-12-19 LAB — RESP PANEL BY RT-PCR (FLU A&B, COVID) ARPGX2
Influenza A by PCR: NEGATIVE
Influenza B by PCR: NEGATIVE
SARS Coronavirus 2 by RT PCR: NEGATIVE

## 2020-12-19 LAB — GROUP A STREP BY PCR: Group A Strep by PCR: NOT DETECTED

## 2020-12-19 MED ORDER — LIDOCAINE VISCOUS HCL 2 % MT SOLN: 10.0000 mL | OROMUCOSAL | 0 refills | Status: DC | PRN

## 2020-12-19 MED ORDER — LIDOCAINE VISCOUS HCL 2 % MT SOLN: 15.0000 mL | Freq: Once | OROMUCOSAL | Status: DC

## 2020-12-19 MED ORDER — METHYLPREDNISOLONE SODIUM SUCC 125 MG IJ SOLR
INTRAMUSCULAR | Status: AC
  Administered 2020-12-19: 125 mg via INTRAVENOUS
  Filled 2020-12-19: qty 2

## 2020-12-19 MED ORDER — METHYLPREDNISOLONE SODIUM SUCC 125 MG IJ SOLR: 125.0000 mg | Freq: Once | INTRAMUSCULAR | Status: AC

## 2020-12-19 MED ORDER — IOHEXOL 300 MG/ML  SOLN
75.0000 mL | Freq: Once | INTRAMUSCULAR | Status: AC | PRN
  Administered 2020-12-19: 75 mL via INTRAVENOUS
  Filled 2020-12-19: qty 75

## 2020-12-19 MED ORDER — ALUM & MAG HYDROXIDE-SIMETH 200-200-20 MG/5ML PO SUSP: 30.0000 mL | Freq: Once | ORAL | Status: DC

## 2020-12-19 MED ORDER — PREDNISONE 50 MG PO TABS: 50.0000 mg | ORAL_TABLET | Freq: Every day | ORAL | 0 refills | Status: DC

## 2020-12-19 NOTE — ED Triage Notes (Signed)
Pt is HOH even with hearing aides in , pt c/o sore throat since waking this morning

## 2020-12-19 NOTE — ED Provider Notes (Signed)
[facility] Emergency Department Provider Note  ____________________________________________  Time seen: Approximately 3:15 PM  I have reviewed the triage vital signs and the nursing notes.   HISTORY  Chief Complaint Sore Throat    HPI Marcus Wright is a 65 y.o. male who presents emergency department complaining of sore throat, cough.  Patient is currently being worked up by ENT and oncology for a neck/basilar skull mass.  Patient has had multiple scans, biopsies performed.  Up to this point none of the results have returned with evidence of malignancy.  Patient has had off and on dysphagia, sore throat.  He states that he is experiencing right-sided sore throat currently.  The patient's daughter reports that she is concerned that he may have a virus as he is also coughing which is a new symptom.  Patient denies any chest pain or shortness of breath.  No emesis, diarrhea or constipation.  No fevers or chills.         Past Medical History:  Diagnosis Date  . Cancer [facility]) 2003   skin'  . Hyperlipidemia   . Hypertension   . Hypothyroidism   . Thyroid disorder   . Wears dentures    full upper  . Wears hearing aid     Patient Active Problem List   Diagnosis Date Noted  . 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 Dysphagia 09/01/2020  . Neck mass 09/01/2020  . Nasal abscess 08/31/2020  . Cellulitis of right ear 03/11/2020  . DJD (degenerative joint disease), thoracic 11/22/2018  . Mastoiditis of right side 06/22/2017  . Hearing loss 05/10/2017  . Chronic cluster headache 02/08/2017  . Arthritis 02/08/2017  . Bilateral hearing loss 01/24/2017  . Ear mass, right 01/24/2017  . Chronic otitis externa of right ear 09/23/2016  . Mixed conductive and sensorineural hearing loss of both ears 09/23/2016  . Chronic mucoid otitis media of both ears 08/26/2016  . 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 Colonic constipation    . Hyperlipidemia 05/21/2016  . Other specified disorders of Eustachian tube, unspecified ear 05/20/2016  . Encounter for fitting or adjustment of hearing aid 01/07/2016  . Chronic mucoid otitis media 11/28/2015  . History of sinus cancer   . Hypertension   . Thyroid disorder   . Dysphagia, pharyngoesophageal phase 07/31/2015  . Sensorineural hearing loss, bilateral 05/20/2015  . Allergic rhinitis due to pollen 07/04/2014  . Cervical radicular pain 02/07/2014  . History of neck surgery 02/07/2014  . Gynecomastia, male 06/20/2013    Past Surgical History:  Procedure Laterality Date  . BACK SURGERY  2011  . BREAST SURGERY Right 11-22-12  . CHOLECYSTECTOMY    . COLONOSCOPY    . COLONOSCOPY WITH PROPOFOL N/A 07/19/2016   Procedure: COLONOSCOPY WITH PROPOFOL;  Surgeon: Lucilla Lame, MD;  Location: [facility];  Service: Endoscopy;  Laterality: N/A;  . REPLACEMENT TOTAL KNEE Right 1993  . SINUS ENDO W/FUSION Right 09/03/2020   Procedure: Removal of Right nasopharyngeal mass, Biopsy and frozen sections;  Surgeon: Jason Coop, DO;  Location: [facility];  Service: ENT;  Laterality: Right;  . SINUS SURGERY WITH INSTATRAK  2003   cancer    Prior to Admission medications   Medication Sig Start Date End Date Taking? Authorizing Provider  lidocaine (XYLOCAINE) 2 % solution Use as directed 10 mLs in the mouth or throat every 4 (four) hours as needed for mouth pain. Gargle and swallow 12/19/20  Yes Deedra Pro, Roderic Palau  D, PA-C  predniSONE (DELTASONE) 50 MG tablet Take 1 tablet (50 mg total) by mouth daily with breakfast. 12/19/20  Yes Gailya Tauer, Charline Bills, PA-C  Alum & Mag Hydroxide-Simeth (GI COCKTAIL) SUSP suspension Shake well. Each dose to containe 22mL maalox and 53mL viscous lidocaine Patient not taking: Reported on 12/11/2020 11/18/20   Harvest Dark, MD  amLODipine (NORVASC) 2.5 MG tablet Take 2.5 mg by mouth daily.    [provider]  doxycycline (VIBRA-TABS) 100 MG  tablet Take 1 tablet (100 mg total) by mouth 2 (two) times daily. 12/01/20   Johnson, Megan P, DO  levothyroxine (SYNTHROID) 50 MCG tablet TAKE 1 TABLET BY MOUTH EVERY DAY Patient taking differently: Take 50 mcg by mouth daily. 02/20/20   Johnson, Megan P, DO  meloxicam (MOBIC) 15 MG tablet TAKE 1 TABLET BY MOUTH EVERY DAY Patient not taking: No sig reported 11/16/20   Park Liter P, DO  ofloxacin (OCUFLOX) 0.3 % ophthalmic solution Place 3 drops in right ear 3 times daily x 7 days 11/18/20   Harvest Dark, MD  rosuvastatin (CRESTOR) 20 MG tablet Take 1 tablet (20 mg total) by mouth daily. 06/03/20   Park Liter P, DO    Allergies Doxazosin  No family history on file.  Social History Social History   Tobacco Use  . Smoking status: Former Research scientist (life sciences)  . Smokeless tobacco: Never Used  . Tobacco comment: quit 20+ yrs ago  Vaping Use  . Vaping Use: Never used  Substance Use Topics  . Alcohol use: Yes    Comment: on occasion, 1-2 beers or wine occ.   . Drug use: No     Review of Systems  Constitutional: No fever/chills Eyes: No visual changes. No discharge ENT: Positive for sore throat Cardiovascular: no chest pain. Respiratory: Positive for nonproductive cough. No SOB. Gastrointestinal: No abdominal pain.  No nausea, no vomiting.  No diarrhea.  No constipation. Musculoskeletal: Negative for musculoskeletal pain. Skin: Negative for rash, abrasions, lacerations, ecchymosis. Neurological: Negative for headaches, focal weakness or numbness.  10 System ROS otherwise negative.  ____________________________________________   PHYSICAL EXAM:  VITAL SIGNS: ED Triage Vitals  Enc Vitals Group     BP 12/19/20 1348 (!) 128/92     Pulse Rate 12/19/20 1348 79     Resp 12/19/20 1348 16     Temp 12/19/20 1348 98.4 F (36.9 C)     Temp Source 12/19/20 1348 Oral     SpO2 12/19/20 1348 98 %     Weight 12/19/20 1349 168 lb (76.2 kg)     Height 12/19/20 1349 6\' 1"  (1.854 m)     Head  Circumference --      Peak Flow --      Pain Score 12/19/20 1349 0     Pain Loc --      Pain Edu? --      Excl. in [place]? --      Constitutional: Alert and oriented. Well appearing and in no acute distress. Eyes: Conjunctivae are normal. PERRL. EOMI. Head: Atraumatic. ENT:      Ears:       Nose: No congestion/rhinnorhea.      Mouth/Throat: Mucous membranes are moist.  Oropharynx is nonerythematous and nonedematous.  Uvula is midline.  There is no exudate.  Oropharynx is patently open.  No angioedema. Neck: No stridor.  Neck is supple full range of motion.  No erythema or edema along the anterior neck. Hematological/Lymphatic/Immunilogical: Right-sided anterior cervical lymphadenopathy/submandibular lymphadenopathy. Cardiovascular: Normal rate, regular  rhythm. Normal S1 and S2.  Good peripheral circulation. Respiratory: Normal respiratory effort without tachypnea or retractions. Lungs CTAB. Good air entry to the bases with no decreased or absent breath sounds. Gastrointestinal: Bowel sounds 4 quadrants. Soft and nontender to palpation. No guarding or rigidity. No palpable masses. No distention. No CVA tenderness. Musculoskeletal: Full range of motion to all extremities. No gross deformities appreciated. Neurologic:  Normal speech and language. No gross focal neurologic deficits are appreciated.  Skin:  Skin is warm, dry and intact. No rash noted. Psychiatric: Mood and affect are normal. Speech and behavior are normal. Patient exhibits appropriate insight and judgement.   ____________________________________________   LABS (all labs ordered are listed, but only abnormal results are displayed)  Labs Reviewed  COMPREHENSIVE METABOLIC PANEL - Abnormal; Notable for the following components:      Result Value   Glucose, Bld 120 (*)    All other components within normal limits  CBC WITH DIFFERENTIAL/PLATELET - Abnormal; Notable for the following components:   RBC 4.10 (*)    Hemoglobin  11.6 (*)    HCT 34.4 (*)    All other components within normal limits  RESP PANEL BY RT-PCR (FLU A&B, COVID) ARPGX2  GROUP A STREP BY PCR   ____________________________________________  EKG   ____________________________________________  RADIOLOGY I personally viewed and evaluated these images as part of my medical decision making, as well as reviewing the written report by the radiologist.  ED Provider Interpretation: Stable soft tissue mass again identified on imaging.  No evidence of significant enlargement.  No other acute findings.  CT Soft Tissue Neck W Contrast  Result Date: 12/19/2020 CLINICAL DATA:  Known neck mass, sore throat EXAM: CT NECK WITH CONTRAST TECHNIQUE: Multidetector CT imaging of the neck was performed using the standard protocol following the bolus administration of intravenous contrast. CONTRAST:  2mL OMNIPAQUE IOHEXOL 300 MG/ML  SOLN COMPARISON:  11/18/2020 FINDINGS: Pharynx and larynx: As before, there is right posterior nasopharyngeal abnormal soft tissue effacing the fossa of Rosenmuller with parapharyngeal extension into the carotid space, and to the skull base. Stable appearance of the right skull base foramina and pterygopalatine fossa. Similar infiltration of fat planes about the right masticator space. Remainder of pharynx and larynx unremarkable. Salivary glands: Stable atrophic appearance. Thyroid: Small in size. Lymph nodes: Decrease in size right level 1B node now measuring 2 x 1.2 cm (previously 2.3 x 1.4 cm). Interval biopsy. Stable left level 1B node measuring 1.8 cm. No new or enlarging lymph nodes. Vascular: Major neck vessels are patent. Plaque at the common carotid bifurcations. Stable caliber of the right internal carotid as it passes through the mass. Persistent effacement of the right internal jugular vein through the area of mass. Limited intracranial: No abnormal enhancement. Visualized orbits: Unremarkable. Mastoids and visualized paranasal  sinuses: Prior sinonasal surgery with minor mucosal thickening. There is greater mucosal thickening along the right sphenoid as before. Thinning or dehiscence of sphenoid bone along the carotid canals is unchanged. Persistent right mastoid and middle ear opacification. Persistent opacification of the bony right external auditory canal with improved aeration of the cartilaginous portion. Decreased right periauricular soft tissue thickening. Skeleton: Prior anterior fusion at C4-C6. Stable cervical spine degenerative changes. Upper chest: No apical lung mass. Other: None. IMPRESSION: Similar appearance of right posterior nasopharyngeal soft tissue extending into the carotid and possibly masticator spaces and to the skull base. Similar associated right mastoid and middle ear opacification. Some improvement in right external auditory canal soft tissue thickening  and opacification. Decrease in size of right level 1B node. Correlate with recent biopsy. Stable left level 1B node. No new or enlarging lymph node. Electronically Signed   By: Macy Mis M.D.   On: 12/19/2020 17:06    ____________________________________________    PROCEDURES  Procedure(s) performed:    Procedures    Medications  alum & mag hydroxide-simeth (MAALOX/MYLANTA) 200-200-20 MG/5ML suspension 30 mL (30 mLs Oral Patient Refused/Not Given 12/19/20 1542)    And  lidocaine (XYLOCAINE) 2 % viscous mouth solution 15 mL (15 mLs Oral Patient Refused/Not Given 12/19/20 1542)  methylPREDNISolone sodium succinate (SOLU-MEDROL) 125 mg/2 mL injection 125 mg (125 mg Intravenous Given 12/19/20 1542)  iohexol (OMNIPAQUE) 300 MG/ML solution 75 mL (75 mLs Intravenous Contrast Given 12/19/20 1630)     ____________________________________________   INITIAL IMPRESSION / ASSESSMENT AND PLAN / ED COURSE  Pertinent labs & imaging results that were available during my care of the patient were reviewed by me and considered in my medical decision  making (see chart for details).  Review of the [place] CSRS was performed in accordance of the [place] prior to dispensing any controlled drugs.  Clinical Course as of 12/19/20 1842  Ludwig Clarks Dec 19, 2020  1547 After head evaluate the patient, family member rushed out of the room calling for a doctor.  When reentering the room, patient had a stridorous sound in his throat.  The patient's family reported that he could not breathe.  Patient was breathing at a roughly 20 times a minute.  Auscultation revealed good air entry into the lung bases with no wheezing.  Patient had upper airway breath sounds identified.  Reevaluation of the patient's throat revealed no erythema, edema, uvula is still midline.  There is no visualized foreign body.  It appears that patient is experiencing a choking sensation however airway is patently open [JC]    Clinical Course User Index [JC] Lakendria Nicastro, Charline Bills, PA-C          Patient's diagnosis is consistent with nasopharyngeal mass, sore throat, choking sensation.  Patient presented to the emergency department with complaints of sore throat and then a choking sensation while he was here.  Patient has a known nasopharyngeal mass that is being evaluated by ENT and oncology.  Patient has had multiple biopsies and tests that have resulted without malignancy.  Patient is still undergoing evaluation for final diagnosis and treatment for this mass.  Given the findings in the sensation, differential included strep, Covid, pharyngitis, worsening mass.  Labs and imaging are reassuring.  No evidence of Covid or strep by swab.  Patient reported improved symptoms after steroid and GI cocktail.  Patient will have prescription for viscous lidocaine and short course of steroid.  Patient is to follow-up with oncology for further recommendations of dealing with this mass.  Return precautions discussed with the patient.. Patient is given ED precautions to return to the ED for any worsening or new  symptoms.     ____________________________________________  FINAL CLINICAL IMPRESSION(S) / ED DIAGNOSES  Final diagnoses:  Nasopharyngeal mass  Sore throat  Choking sensation      NEW MEDICATIONS STARTED DURING THIS VISIT:  ED Discharge Orders         Ordered    lidocaine (XYLOCAINE) 2 % solution  Every 4 hours PRN        12/19/20 1840    predniSONE (DELTASONE) 50 MG tablet  Daily with breakfast        12/19/20 1840  This chart was dictated using voice recognition software/Dragon. Despite best efforts to proofread, errors can occur which can change the meaning. Any change was purely unintentional.    Darletta Moll, PA-C 12/19/20 1842    Harvest Dark, MD 12/19/20 2159

## 2020-12-23 ENCOUNTER — Other Ambulatory Visit (HOSPITAL_COMMUNITY)
Admission: RE | Admit: 2020-12-23 | Discharge: 2020-12-23 | Disposition: A | Discharge status: Home / Self Care | Payer: Medicare Other | Source: Ambulatory Visit | Attending: Family Medicine | Admitting: Family Medicine

## 2020-12-23 ENCOUNTER — Encounter: Payer: Self-pay | Admitting: Family Medicine

## 2020-12-23 ENCOUNTER — Other Ambulatory Visit: Payer: Self-pay | Admitting: Family Medicine

## 2020-12-23 ENCOUNTER — Other Ambulatory Visit: Payer: Self-pay

## 2020-12-23 ENCOUNTER — Ambulatory Visit (INDEPENDENT_AMBULATORY_CARE_PROVIDER_SITE_OTHER): Payer: Medicare Other | Admitting: Family Medicine

## 2020-12-23 VITALS — BP 118/74 | HR 80 | Temp 98.2°F | Wt 168.0 lb

## 2020-12-23 DIAGNOSIS — S41101A Unspecified open wound of right upper arm, initial encounter: Secondary | ICD-10-CM | POA: Diagnosis not present

## 2020-12-23 DIAGNOSIS — C44622 Squamous cell carcinoma of skin of right upper limb, including shoulder: Secondary | ICD-10-CM | POA: Diagnosis not present

## 2020-12-23 NOTE — Telephone Encounter (Signed)
Requested medications are due for refill today yes  Requested medications are on the active medication list yes  Last refill 09/15/20  Last visit 12/11/20  Future visit scheduled 02/05/21  Notes to clinic Historical Provider

## 2020-12-23 NOTE — Patient Instructions (Signed)
We took off that thing on your arm to make sure that there was no cancer causing it not to heal.  The black stuff is the cautery that stops the bleeding- it's nothing to worry about.  It will ache when the numbning medicine wears off in 4-6 hours- that's normal. You can take some tylenol or ibuprofen to help with the pain.  Keep it covered for about 3 days to allow it to heal.  Watch out for redness, swelling heat or severe pain- a white film on top in the next couple of days is normal- that's just skin healing even if it may look like pus- if there's no heat, redness or swelling it's nothing to worry about.   There's not much I can do for the choking feeling- follow up with your ENT and Dr. Baruch Gouty to see if there is something they can do.   I'm seeing you for your regular visit in May. Have a great rest of your spring.

## 2020-12-23 NOTE — Progress Notes (Signed)
BP 118/74   Pulse 80   Temp 98.2 F (36.8 C)   Wt 168 lb (76.2 kg)   SpO2 98%   BMI 22.16 kg/m    Subjective:    Patient ID: Marcus Wright, male    DOB: Aug 13, 1956, 65 y.o.   MRN: [id number]  HPI: Marcus Wright is a 65 y.o. male  Chief Complaint  Patient presents with  . shave biposy    SKIN LESION Duration: 5+ months Location: R elbow Painful: no Itching: no Onset: gradual Context: not changing History of skin cancer: no  Relevant past medical, surgical, family and social history reviewed and updated as indicated. Interim medical history since our last visit reviewed. Allergies and medications reviewed and updated.  Review of Systems  Constitutional: Negative.   Respiratory: Negative.   Cardiovascular: Negative.   Gastrointestinal: Negative.   Musculoskeletal: Negative.   Skin: Positive for wound. Negative for color change, pallor and rash.  Psychiatric/Behavioral: Negative.     Per HPI unless specifically indicated above     Objective:    BP 118/74   Pulse 80   Temp 98.2 F (36.8 C)   Wt 168 lb (76.2 kg)   SpO2 98%   BMI 22.16 kg/m   Wt Readings from Last 3 Encounters:  12/23/20 168 lb (76.2 kg)  12/19/20 168 lb (76.2 kg)  12/11/20 167 lb 6.4 oz (75.9 kg)    Physical Exam Vitals and nursing note reviewed.  Constitutional:      General: He is not in acute distress.    Appearance: Normal appearance. He is not ill-appearing, toxic-appearing or diaphoretic.  HENT:     Head: Normocephalic and atraumatic.     Right Ear: External ear normal.     Left Ear: External ear normal.     Nose: Nose normal.     Mouth/Throat:     Mouth: Mucous membranes are moist.     Pharynx: Oropharynx is clear.  Eyes:     General: No scleral icterus.       Right eye: No discharge.        Left eye: No discharge.     Extraocular Movements: Extraocular movements intact.     Conjunctiva/sclera: Conjunctivae normal.     Pupils: Pupils are equal, round, and  reactive to light.  Cardiovascular:     Rate and Rhythm: Normal rate and regular rhythm.     Pulses: Normal pulses.     Heart sounds: Normal heart sounds. No murmur heard. No friction rub. No gallop.   Pulmonary:     Effort: Pulmonary effort is normal. No respiratory distress.     Breath sounds: Normal breath sounds. No stridor. No wheezing, rhonchi or rales.  Chest:     Chest wall: No tenderness.  Musculoskeletal:        General: Normal range of motion.     Cervical back: Normal range of motion and neck supple.  Skin:    General: Skin is warm and dry.     Capillary Refill: Capillary refill takes less than 2 seconds.     Coloration: Skin is not jaundiced or pale.     Findings: No bruising, erythema, lesion or rash.     Comments: non healing wound about 1 cm scab on R elbow x 5 months  Neurological:     General: No focal deficit present.     Mental Status: He is alert and oriented to person, place, and time. Mental status is at baseline.  Psychiatric:        Mood and Affect: Mood normal.        Behavior: Behavior normal.        Thought Content: Thought content normal.        Judgment: Judgment normal.     Results for orders placed or performed during the hospital encounter of 12/19/20  Resp Panel by RT-PCR (Flu A&B, Covid) Nasopharyngeal Swab   Specimen: Nasopharyngeal Swab; Nasopharyngeal(NP) swabs in vial transport medium  Result Value Ref Range   SARS Coronavirus 2 by RT PCR NEGATIVE NEGATIVE   Influenza A by PCR NEGATIVE NEGATIVE   Influenza B by PCR NEGATIVE NEGATIVE  Group A Strep by PCR ([facility] Only)   Specimen: Nasopharyngeal Swab; Sterile Swab  Result Value Ref Range   Group A Strep by PCR NOT DETECTED NOT DETECTED  Comprehensive metabolic panel  Result Value Ref Range   Sodium 136 135 - 145 mmol/L   Potassium 3.8 3.5 - 5.1 mmol/L   Chloride 100 98 - 111 mmol/L   CO2 27 22 - 32 mmol/L   Glucose, Bld 120 (H) 70 - 99 mg/dL   BUN 11 8 - 23 mg/dL   Creatinine, Ser  0.81 0.61 - 1.24 mg/dL   Calcium 9.6 8.9 - 10.3 mg/dL   Total Protein 7.3 6.5 - 8.1 g/dL   Albumin 4.7 3.5 - 5.0 g/dL   AST 24 15 - 41 U/L   ALT 18 0 - 44 U/L   Alkaline Phosphatase 63 38 - 126 U/L   Total Bilirubin 0.6 0.3 - 1.2 mg/dL   GFR, Estimated >60 >60 mL/min   Anion gap 9 5 - 15  CBC with Differential  Result Value Ref Range   WBC 8.4 4.0 - 10.5 K/uL   RBC 4.10 (L) 4.22 - 5.81 MIL/uL   Hemoglobin 11.6 (L) 13.0 - 17.0 g/dL   HCT 34.4 (L) 39.0 - 52.0 %   MCV 83.9 80.0 - 100.0 fL   MCH 28.3 26.0 - 34.0 pg   MCHC 33.7 30.0 - 36.0 g/dL   RDW 12.8 11.5 - 15.5 %   Platelets 257 150 - 400 K/uL   nRBC 0.0 0.0 - 0.2 %   Neutrophils Relative % 58 %   Neutro Abs 4.9 1.7 - 7.7 K/uL   Lymphocytes Relative 32 %   Lymphs Abs 2.7 0.7 - 4.0 K/uL   Monocytes Relative 7 %   Monocytes Absolute 0.6 0.1 - 1.0 K/uL   Eosinophils Relative 2 %   Eosinophils Absolute 0.2 0.0 - 0.5 K/uL   Basophils Relative 1 %   Basophils Absolute 0.1 0.0 - 0.1 K/uL   Immature Granulocytes 0 %   Abs Immature Granulocytes 0.01 0.00 - 0.07 K/uL      Assessment & Plan:   Problem List Items Addressed This Visit   None   Visit Diagnoses    Non-healing wound of right upper extremity    -  Primary   Removed today with shave biopsy. Await results. Call with any concerns.    Relevant Orders   Surgical pathology      Skin Procedure  Procedure: Informed consent given.  Sterile prep of the area.  Area infiltrated with lidocaine with epinephrine.  Using a surgical blade, part of the upper dermis shaved off and sent  for pathology.  Area cauterized. Pt ed on scarring.     Diagnosis:   ICD-10-CM   1. Non-healing wound of right upper extremity  S41.101A Surgical  pathology   Removed today with shave biopsy. Await results. Call with any concerns.     Lesion Location/Size: non healing wound about 1 cm scab on R elbow x 5 months  Physician: MJ Consent:  Risks, benefits, and alternative treatments discussed and  all questions were answered.  Patient elected to proceed and verbal consent obtained.  Description: Area prepped and draped using semi-sterile technique. Area locally anesthetized using 4 cc's of lidocaine 1% with epi.  Shave biopsy of lesion performed using a dermablade.  Adequate hemostastis achieved using Silver Nitrate. Wound dressed after application of bacitracin ointment.  Post Procedure Instructions:Wound care instructions discussed and patient was instructed to keep area clean and dry.  Signs and symptoms of infection discussed, patient agrees to contact the office ASAP should they occur.  Dressing change recommended every 3 days.   Follow up plan: Return as scheduled.

## 2020-12-24 ENCOUNTER — Other Ambulatory Visit: Payer: Self-pay | Admitting: *Deleted

## 2020-12-24 ENCOUNTER — Ambulatory Visit
Admission: RE | Admit: 2020-12-24 | Discharge: 2020-12-24 | Disposition: A | Discharge status: Home / Self Care | Payer: Medicare Other | Source: Ambulatory Visit | Attending: Radiation Oncology | Admitting: Radiation Oncology

## 2020-12-24 VITALS — BP 134/84 | HR 73 | Temp 97.2°F | Wt 163.0 lb

## 2020-12-24 DIAGNOSIS — C313 Malignant neoplasm of sphenoid sinus: Secondary | ICD-10-CM

## 2020-12-24 DIAGNOSIS — J392 Other diseases of pharynx: Secondary | ICD-10-CM

## 2020-12-24 DIAGNOSIS — Z923 Personal history of irradiation: Secondary | ICD-10-CM | POA: Diagnosis not present

## 2020-12-24 NOTE — Consult Note (Signed)
NEW PATIENT EVALUATION  Name: Marcus Wright  MRN: [id number]  Date:   12/24/2020     DOB: 24-Sep-1956   This 65 y.o. 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  REFERRING PHYSICIAN: Park Liter P, DO  CHIEF COMPLAINT: No chief complaint on file.   DIAGNOSIS: The encounter diagnosis was Nasopharyngeal mass.   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  He has profound hearing loss.  He has been followed by ENT at [facility].  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  He is also had a tympanomastoidectomy on the left with ossicular chain risk reconstruction.  He recently presented with increased disequilibrium and a CT scan showed questionable mass in the nasopharynx.  Biopsy of this area were negative for malignancy.  Right now he is having no head and neck pain.  He does have some dysphagia although he said no formal swallowing studies.  He is accompanied today by his brother.  PLANNED TREATMENT REGIMEN: Work-up for possible nasopharyngeal carcinoma  PAST MEDICAL HISTORY:  has a past medical history of Cancer ([place]) (2003), Hyperlipidemia, Hypertension, Hypothyroidism, Thyroid disorder, Wears dentures, and Wears hearing aid.    PAST SURGICAL HISTORY:  Past Surgical History:  Procedure Laterality Date  . BACK SURGERY  2011  . BREAST SURGERY Right 11-22-12  . CHOLECYSTECTOMY    . COLONOSCOPY    . COLONOSCOPY WITH PROPOFOL N/A 07/19/2016   Procedure: COLONOSCOPY WITH PROPOFOL;  Surgeon: Lucilla Lame, MD;  Location:  [place];  Service: Endoscopy;  Laterality: N/A;  . REPLACEMENT TOTAL KNEE Right 1993  . SINUS ENDO W/FUSION Right 09/03/2020   Procedure: Removal of Right nasopharyngeal mass, Biopsy and frozen sections;  Surgeon: Jason Coop, DO;  Location: [place];  Service: ENT;  Laterality: Right;  . SINUS SURGERY WITH INSTATRAK  2003   cancer    FAMILY HISTORY: family history is not on file.  SOCIAL HISTORY:  reports that he has quit smoking. He has never used smokeless tobacco. He reports current alcohol use. He reports that he does not use drugs.  ALLERGIES: Doxazosin  MEDICATIONS:  Current Outpatient Medications  Medication Sig Dispense Refill  . amLODipine (NORVASC) 2.5 MG tablet TAKE 1 TABLET BY MOUTH EVERY DAY 90 tablet 1  . doxycycline (VIBRA-TABS) 100 MG tablet Take 1 tablet (100 mg total) by mouth 2 (two) times daily. 20 tablet 0  . levothyroxine (SYNTHROID) 50 MCG tablet TAKE 1 TABLET BY MOUTH EVERY DAY (Patient taking differently: Take 50 mcg by mouth daily.) 90 tablet 3  . lidocaine (XYLOCAINE) 2 % solution Use as directed 10 mLs in the mouth or throat every 4 (four) hours as needed for mouth pain. Gargle and swallow 200 mL 0  . ofloxacin (OCUFLOX) 0.3 % ophthalmic solution Place 3 drops in right ear 3 times daily x 7 days 10 mL 0  . predniSONE (DELTASONE) 50 MG tablet Take 1 tablet (50 mg total) by mouth daily with breakfast. 5 tablet 0  . rosuvastatin (CRESTOR) 20 MG tablet Take 1 tablet (20 mg total) by mouth daily. 90 tablet 1  .  Alum & Mag Hydroxide-Simeth (GI COCKTAIL) SUSP suspension Shake well. Each dose to containe 38mL maalox and 1mL viscous lidocaine (Patient not taking: No sig reported) 300 mL 0  . meloxicam (MOBIC) 15 MG tablet TAKE 1 TABLET BY MOUTH EVERY DAY (Patient not taking: No sig reported) 90 tablet 1   No current facility-administered medications for this encounter.    ECOG PERFORMANCE STATUS:  1 - Symptomatic but completely ambulatory  REVIEW  OF SYSTEMS: Patient has profound hearing loss also dysphagia as described above. Patient denies any weight loss, fatigue, weakness, fever, chills or night sweats. Patient denies any loss of vision, blurred vision. Patient denies any ringing  of the ears or hearing loss. No irregular heartbeat. Patient denies heart murmur or history of fainting. Patient denies any chest pain or pain radiating to her upper extremities. Patient denies any shortness of breath, difficulty breathing at night, cough or hemoptysis. Patient denies any swelling in the lower legs. Patient denies any nausea vomiting, vomiting of blood, or coffee ground material in the vomitus. Patient denies any stomach pain. Patient states has had normal bowel movements no significant constipation or diarrhea. Patient denies any dysuria, hematuria or significant nocturia. Patient denies any problems walking, swelling in the joints or loss of balance. Patient denies any skin changes, loss of hair or loss of weight. Patient denies any excessive worrying or anxiety or significant depression. Patient denies any problems with insomnia. Patient denies excessive thirst, polyuria, polydipsia. Patient denies any swollen glands, patient denies easy bruising or easy bleeding. Patient denies any recent infections, allergies or URI. Patient "s visual fields have not changed significantly in recent time.   PHYSICAL EXAM: BP 134/84   Pulse 73   Temp (!) 97.2 F (36.2 C) (Tympanic)   Wt 163 lb (73.9 kg)   BMI 21.51 kg/m  No evidence of cervical adenopathy is identified.  Well-developed well-nourished patient in NAD. HEENT reveals PERLA, EOMI, discs not visualized.  Oral cavity is clear. No oral mucosal lesions are identified. Neck is clear without evidence of cervical or supraclavicular adenopathy. Lungs are clear to A&P. Cardiac examination is essentially unremarkable with regular rate and rhythm without murmur rub or thrill. Abdomen is benign with no organomegaly  or masses noted. Motor sensory and DTR levels are equal and symmetric in the upper and lower extremities. Cranial nerves II through XII are grossly intact. Proprioception is intact. No peripheral adenopathy or edema is identified. No motor or sensory levels are noted. Crude visual fields are within normal range.  LABORATORY DATA: Pathology report reviewed showing no evidence of malignancy.    RADIOLOGY RESULTS: PET/CT and swallowing study has been ordered   IMPRESSION: Rule out nasopharyngeal carcinoma in 65 year old male with previous history of head and neck cancer 20 years prior treated with chemoradiation  PLAN: This time of ordered a PET CT scan to clearly delineate what is going on in the nasopharyngeal region.  Should this be negative will refer back to his current care providers.  I have also ordered a swallowing study and will make further recommendations and possible GI consult should he require dilatation.  Brother and patient both comprehend my recommendations well.  I will follow up with the patient shortly after his studies are complete.  I would like to take this opportunity to thank you for allowing me to participate in the care of your patient.Noreene Filbert, MD

## 2020-12-25 ENCOUNTER — Encounter: Payer: Self-pay | Admitting: Emergency Medicine

## 2020-12-25 ENCOUNTER — Other Ambulatory Visit: Payer: Self-pay

## 2020-12-25 DIAGNOSIS — R1310 Dysphagia, unspecified: Secondary | ICD-10-CM | POA: Diagnosis not present

## 2020-12-25 DIAGNOSIS — E039 Hypothyroidism, unspecified: Secondary | ICD-10-CM | POA: Insufficient documentation

## 2020-12-25 DIAGNOSIS — J029 Acute pharyngitis, unspecified: Secondary | ICD-10-CM | POA: Insufficient documentation

## 2020-12-25 DIAGNOSIS — R059 Cough, unspecified: Secondary | ICD-10-CM | POA: Diagnosis not present

## 2020-12-25 DIAGNOSIS — Z87891 Personal history of nicotine dependence: Secondary | ICD-10-CM | POA: Diagnosis not present

## 2020-12-25 DIAGNOSIS — R062 Wheezing: Secondary | ICD-10-CM | POA: Insufficient documentation

## 2020-12-25 DIAGNOSIS — I10 Essential (primary) hypertension: Secondary | ICD-10-CM | POA: Insufficient documentation

## 2020-12-25 DIAGNOSIS — Z85828 Personal history of other malignant neoplasm of skin: Secondary | ICD-10-CM | POA: Insufficient documentation

## 2020-12-25 DIAGNOSIS — Z79899 Other long term (current) drug therapy: Secondary | ICD-10-CM | POA: Diagnosis not present

## 2020-12-25 DIAGNOSIS — Z96651 Presence of right artificial knee joint: Secondary | ICD-10-CM | POA: Diagnosis not present

## 2020-12-25 NOTE — ED Triage Notes (Addendum)
Pt states for the past few months he has had sore throat and congestion and feels like he is having difficulty breathing. Pt has PET scan and swallow study ordered by dr. Donella Stade. Pt also has continuous drainage from right ear. Family member with pt states this has been issue for over a year.

## 2020-12-26 ENCOUNTER — Emergency Department
Admission: EM | Admit: 2020-12-26 | Discharge: 2020-12-26 | Disposition: A | Discharge status: Home / Self Care | Payer: Medicare Other | Attending: Emergency Medicine | Admitting: Emergency Medicine

## 2020-12-26 ENCOUNTER — Telehealth: Payer: Self-pay | Admitting: Family Medicine

## 2020-12-26 ENCOUNTER — Emergency Department: Payer: Medicare Other

## 2020-12-26 DIAGNOSIS — R1310 Dysphagia, unspecified: Secondary | ICD-10-CM | POA: Diagnosis not present

## 2020-12-26 DIAGNOSIS — C44622 Squamous cell carcinoma of skin of right upper limb, including shoulder: Secondary | ICD-10-CM

## 2020-12-26 DIAGNOSIS — R059 Cough, unspecified: Secondary | ICD-10-CM | POA: Diagnosis not present

## 2020-12-26 LAB — SURGICAL PATHOLOGY

## 2020-12-26 MED ORDER — IPRATROPIUM-ALBUTEROL 0.5-2.5 (3) MG/3ML IN SOLN
3.0000 mL | Freq: Once | RESPIRATORY_TRACT | Status: AC
  Administered 2020-12-26: 3 mL via RESPIRATORY_TRACT
  Filled 2020-12-26: qty 3

## 2020-12-26 MED ORDER — SUCRALFATE 1 GM/10ML PO SUSP: 1.0000 g | Freq: Three times a day (TID) | ORAL | 0 refills | Status: DC

## 2020-12-26 MED ORDER — HYDROCOD POLST-CPM POLST ER 10-8 MG/5ML PO SUER: 5.0000 mL | Freq: Two times a day (BID) | ORAL | 0 refills | Status: DC

## 2020-12-26 MED ORDER — HYDROCOD POLST-CPM POLST ER 10-8 MG/5ML PO SUER
5.0000 mL | Freq: Once | ORAL | Status: AC
  Administered 2020-12-26: 5 mL via ORAL
  Filled 2020-12-26: qty 5

## 2020-12-26 MED ORDER — MAGIC MOUTHWASH: 5.0000 mL | Freq: Three times a day (TID) | ORAL | 0 refills | Status: DC | PRN

## 2020-12-26 MED ORDER — ALBUTEROL SULFATE HFA 108 (90 BASE) MCG/ACT IN AERS: 2.0000 | INHALATION_SPRAY | RESPIRATORY_TRACT | 0 refills | Status: DC | PRN

## 2020-12-26 MED ORDER — DEXAMETHASONE SODIUM PHOSPHATE 10 MG/ML IJ SOLN
10.0000 mg | Freq: Once | INTRAMUSCULAR | Status: AC
  Administered 2020-12-26: 10 mg via INTRAMUSCULAR
  Filled 2020-12-26: qty 1

## 2020-12-26 MED ORDER — NYSTATIN 100000 UNIT/ML MT SUSP: 5.0000 mL | Freq: Four times a day (QID) | OROMUCOSAL | 0 refills | Status: DC

## 2020-12-26 MED ORDER — NYSTATIN 100000 UNIT/ML MT SUSP
5.0000 mL | Freq: Once | OROMUCOSAL | Status: AC
  Administered 2020-12-26: 500000 [IU] via ORAL
  Filled 2020-12-26: qty 5

## 2020-12-26 MED ORDER — MAGIC MOUTHWASH
10.0000 mL | Freq: Once | ORAL | Status: AC
  Administered 2020-12-26: 10 mL via ORAL
  Filled 2020-12-26: qty 10

## 2020-12-26 NOTE — ED Provider Notes (Signed)
[facility] Emergency Department Provider Note   ____________________________________________   Event Date/Time   First MD Initiated Contact with Patient 12/26/20 0133     (approximate)  I have reviewed the triage vital signs and the nursing notes.   HISTORY  Chief Complaint Sore Throat  Level V caveat: Patient is deaf, does not use ASL.  Communication via writing with pen and paper.  Also assisted by patient's son-in-law.  HPI Marcus Wright is a 65 y.o. male who presents to the ED from home with a chief complaint of sore throat, cough, dysphagia.  Patient is currently being worked up by ENT for possible nasopharyngeal mass.  So far work-up including biopsies have been negative for malignancy.  He was referred by his [facility] ENT surgeon to Dr. Donella Stade from radiation/oncology whom he saw 20 years ago who has ordered PET scan and swallow study.  Patient states symptoms have been ongoing for the past several months; family member states this has been ongoing for the past year.  Patient was seen in the ED recently for same.  He had a work-up including CT of his neck, negative Covid test; placed on lidocaine rinse and prednisone.  Has not continued lidocaine rinse because it numbs his throat and he does not like the feeling.  He did finish the course of prednisone.  Denies fever, chest pain, shortness of breath, abdominal pain, nausea, vomiting or diarrhea.     Past Medical History:  Diagnosis Date  . Cancer [facility]) 2003   skin'  . Hyperlipidemia   . Hypertension   . Hypothyroidism   . Thyroid disorder   . Wears dentures    full upper  . Wears hearing aid     Patient Active Problem List   Diagnosis Date Noted  . 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 Dysphagia 09/01/2020  . Neck mass 09/01/2020  . Nasal abscess 08/31/2020  . Cellulitis of right ear 03/11/2020  . DJD (degenerative joint disease), thoracic 11/22/2018  . Mastoiditis of right side  06/22/2017  . Hearing loss 05/10/2017  . Chronic cluster headache 02/08/2017  . Arthritis 02/08/2017  . Bilateral hearing loss 01/24/2017  . Ear mass, right 01/24/2017  . Chronic otitis externa of right ear 09/23/2016  . Mixed conductive and sensorineural hearing loss of both ears 09/23/2016  . Chronic mucoid otitis media of both ears 08/26/2016  . 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 Colonic constipation   . Hyperlipidemia 05/21/2016  . Other specified disorders of Eustachian tube, unspecified ear 05/20/2016  . Encounter for fitting or adjustment of hearing aid 01/07/2016  . Chronic mucoid otitis media 11/28/2015  . History of sinus cancer   . Hypertension   . Thyroid disorder   . Dysphagia, pharyngoesophageal phase 07/31/2015  . Sensorineural hearing loss, bilateral 05/20/2015  . Allergic rhinitis due to pollen 07/04/2014  . Cervical radicular pain 02/07/2014  . History of neck surgery 02/07/2014  . Gynecomastia, male 06/20/2013    Past Surgical History:  Procedure Laterality Date  . BACK SURGERY  2011  . BREAST SURGERY Right 11-22-12  . CHOLECYSTECTOMY    . COLONOSCOPY    . COLONOSCOPY WITH PROPOFOL N/A 07/19/2016   Procedure: COLONOSCOPY WITH PROPOFOL;  Surgeon: Lucilla Lame, MD;  Location: [place];  Service: Endoscopy;  Laterality: N/A;  . REPLACEMENT TOTAL KNEE Right 1993  . SINUS ENDO W/FUSION Right 09/03/2020   Procedure: Removal of Right nasopharyngeal mass, Biopsy and  frozen sections;  Surgeon: Jason Coop, DO;  [facility];  Service: ENT;  Laterality: Right;  . SINUS SURGERY WITH INSTATRAK  2003   cancer    Prior to Admission medications   Medication Sig Start Date End Date Taking? Authorizing Provider  albuterol (VENTOLIN HFA) 108 (90 Base) MCG/ACT inhaler Inhale 2 puffs into the lungs every 4 (four) hours as needed for wheezing or shortness of breath. 12/26/20  Yes Paulette Blanch, MD   chlorpheniramine-HYDROcodone [facility] PENNKINETIC ER) 10-8 MG/5ML SUER Take 5 mLs by mouth 2 (two) times daily. 12/26/20  Yes Paulette Blanch, MD  magic mouthwash SOLN Take 5 mLs by mouth 3 (three) times daily as needed for mouth pain. 12/26/20  Yes Paulette Blanch, MD  nystatin (MYCOSTATIN) 100000 UNIT/ML suspension Take 5 mLs (500,000 Units total) by mouth 4 (four) times daily. 12/26/20  Yes Paulette Blanch, MD  sucralfate (CARAFATE) 1 GM/10ML suspension Take 10 mLs (1 g total) by mouth 4 (four) times daily -  with meals and at bedtime. 12/26/20  Yes Paulette Blanch, MD  Alum & Mag Hydroxide-Simeth (GI COCKTAIL) SUSP suspension Shake well. Each dose to containe 71mL maalox and 39mL viscous lidocaine Patient not taking: No sig reported 11/18/20   Harvest Dark, MD  amLODipine (NORVASC) 2.5 MG tablet TAKE 1 TABLET BY MOUTH EVERY DAY 12/23/20   Johnson, Megan P, DO  doxycycline (VIBRA-TABS) 100 MG tablet Take 1 tablet (100 mg total) by mouth 2 (two) times daily. 12/01/20   Johnson, Megan P, DO  levothyroxine (SYNTHROID) 50 MCG tablet TAKE 1 TABLET BY MOUTH EVERY DAY Patient taking differently: Take 50 mcg by mouth daily. 02/20/20   Johnson, Megan P, DO  lidocaine (XYLOCAINE) 2 % solution Use as directed 10 mLs in the mouth or throat every 4 (four) hours as needed for mouth pain. Gargle and swallow 12/19/20   Cuthriell, Charline Bills, PA-C  meloxicam (MOBIC) 15 MG tablet TAKE 1 TABLET BY MOUTH EVERY DAY Patient not taking: No sig reported 11/16/20   Park Liter P, DO  ofloxacin (OCUFLOX) 0.3 % ophthalmic solution Place 3 drops in right ear 3 times daily x 7 days 11/18/20   Harvest Dark, MD  predniSONE (DELTASONE) 50 MG tablet Take 1 tablet (50 mg total) by mouth daily with breakfast. 12/19/20   Cuthriell, Charline Bills, PA-C  rosuvastatin (CRESTOR) 20 MG tablet Take 1 tablet (20 mg total) by mouth daily. 06/03/20   Park Liter P, DO    Allergies Doxazosin  No family history on file.  Social History Social  History   Tobacco Use  . Smoking status: Former Research scientist (life sciences)  . Smokeless tobacco: Never Used  . Tobacco comment: quit 20+ yrs ago  Vaping Use  . Vaping Use: Never used  Substance Use Topics  . Alcohol use: Yes    Comment: on occasion, 1-2 beers or wine occ.   . Drug use: No    Review of Systems  Constitutional: No fever/chills Eyes: No visual changes. ENT: Positive for sore throat and difficulty swallowing. Cardiovascular: Denies chest pain. Respiratory: Positive for cough.  Denies shortness of breath. Gastrointestinal: No abdominal pain.  No nausea, no vomiting.  No diarrhea.  No constipation. Genitourinary: Negative for dysuria. Musculoskeletal: Negative for back pain. Skin: Negative for rash. Neurological: Negative for headaches, focal weakness or numbness.   ____________________________________________   PHYSICAL EXAM:  VITAL SIGNS: ED Triage Vitals  Enc Vitals Group     BP 12/25/20 2350 126/85  Pulse Rate 12/25/20 2350 69     Resp 12/25/20 2350 18     Temp 12/25/20 2350 98.2 F (36.8 C)     Temp Source 12/25/20 2350 Oral     SpO2 12/25/20 2350 98 %     Weight 12/25/20 2351 163 lb (73.9 kg)     Height 12/25/20 2351 6\' 1"  (1.854 m)     Head Circumference --      Peak Flow --      Pain Score 12/25/20 2351 7     Pain Loc --      Pain Edu? --      Excl. in [place]? --     Constitutional: Alert and oriented.  Chronically ill appearing and in mild acute distress. Eyes: Conjunctivae are normal. PERRL. EOMI. Head: Atraumatic. Ears: Bilateral TMs unremarkable. Nose: No congestion/rhinnorhea. Mouth/Throat: Mucous membranes are moist.  Oropharynx mildly erythematous without tonsillar exudate, swelling or peritonsillar abscess.  Multiple white patches to upper palate consistent with thrush.  There is no hoarse or muffled voice.  There is no drooling. Neck: No stridor.   Cardiovascular: Normal rate, regular rhythm. Grossly normal heart sounds.  Good peripheral  circulation. Respiratory: Loose, bronchitic cough noted.  Normal respiratory effort.  No retractions. Lungs with scattered wheezing. Gastrointestinal: Soft and nontender to light or deep palpation. No distention. No abdominal bruits. No CVA tenderness. Musculoskeletal: No lower extremity tenderness nor edema.  No joint effusions. Neurologic:  Normal speech and language. No gross focal neurologic deficits are appreciated. No gait instability. Skin:  Skin is warm, dry and intact. No rash noted. Psychiatric: Mood and affect are normal. Speech and behavior are normal.  ____________________________________________   LABS (all labs ordered are listed, but only abnormal results are displayed)  Labs Reviewed - No data to display ____________________________________________  EKG  None ____________________________________________  RADIOLOGY I, Janthony Holleman J, personally viewed and evaluated these images (plain radiographs) as part of my medical decision making, as well as reviewing the written report by the radiologist.  ED MD interpretation: No acute cardiopulmonary process  Official radiology report(s): DG Chest 2 View  Result Date: 12/26/2020 CLINICAL DATA:  Cough EXAM: CHEST - 2 VIEW COMPARISON:  08/31/2020 FINDINGS: Cardiac shadow is within normal limits. Aortic calcifications are noted. Lungs are well aerated bilaterally. No bony abnormality is noted. IMPRESSION: No active cardiopulmonary disease. Electronically Signed   By: Inez Catalina M.D.   On: 12/26/2020 02:35    ____________________________________________   PROCEDURES  Procedure(s) performed (including Critical Care):  Procedures   ____________________________________________   INITIAL IMPRESSION / ASSESSMENT AND PLAN / ED COURSE  As part of my medical decision making, I reviewed the following data within the [place] History obtained from family, Nursing notes reviewed and incorporated, Old chart  reviewed, Radiograph reviewed and Notes from prior ED visits     65 year old male presenting with dysphagia x1 year, throat discomfort, cough.  Declines to use lidocaine rinse secondary to its numbing effect.  Seen in the ED recently for same with work-up including CT scanning.  Will treat symptomatically with Magic mouthwash for throat discomfort, Tussionex for cough, nystatin for thrush, DuoNeb.  Administer single dose IM Decadron.  Will reassess.  Clinical Course as of 12/26/20 0304  Fri Dec 26, 2020  0303 Patient feeling much better.  Will discharge home with prescriptions for sulcal fate, nystatin, Tussionex, Magic mouthwash and albuterol inhaler.  Will refer to GI for further evaluation of dysphagia.  Strict return precautions given.  Patient and family member verbalized understanding agree with plan of care. [JS]    Clinical Course User Index [JS] Paulette Blanch, MD     ____________________________________________   FINAL CLINICAL IMPRESSION(S) / ED DIAGNOSES  Final diagnoses:  Dysphagia, unspecified type     ED Discharge Orders         Ordered    albuterol (VENTOLIN HFA) 108 (90 Base) MCG/ACT inhaler  Every 4 hours PRN        12/26/20 0300    magic mouthwash SOLN  3 times daily PRN        12/26/20 0300    chlorpheniramine-HYDROcodone (TUSSIONEX PENNKINETIC ER) 10-8 MG/5ML SUER  2 times daily        12/26/20 0300    nystatin (MYCOSTATIN) 100000 UNIT/ML suspension  4 times daily        12/26/20 0300    sucralfate (CARAFATE) 1 GM/10ML suspension  3 times daily with meals & bedtime        12/26/20 0300          *Please note:  Marcus Wright was evaluated in Emergency Department on 12/26/2020 for the symptoms described in the history of present illness. He was evaluated in the context of the global COVID-19 pandemic, which necessitated consideration that the patient might be at risk for infection with the SARS-CoV-2 virus that causes COVID-19. Institutional protocols and  algorithms that pertain to the evaluation of patients at risk for COVID-19 are in a state of rapid change based on information released by regulatory bodies including the CDC and federal and state organizations. These policies and algorithms were followed during the patient's care in the ED.  Some ED evaluations and interventions may be delayed as a result of limited staffing during and the pandemic.*   Note:  This document was prepared using Dragon voice recognition software and may include unintentional dictation errors.   Paulette Blanch, MD 12/26/20 [id number]

## 2020-12-26 NOTE — Telephone Encounter (Signed)
Patient's son notified.

## 2020-12-26 NOTE — Telephone Encounter (Signed)
Called by pathology regarding biopsy. Unfortunately it showed Invasive squamous cell with + margins.   Will need to see dermatology ASAP. Urgent referral placed- please call son to explain as patient cannot hear.

## 2020-12-26 NOTE — Discharge Instructions (Addendum)
1.  Start Sulcrafate 3 times daily with meals and at bedtime. 2.  Apply Nystatin suspension 4 times daily x1 week for thrush. 3.  You may use Magic mouthwash as needed for throat discomfort. 4.  You may take Tussionex as needed for cough. 5.  You may use Albuterol inhaler 2 puffs every 4 hours as needed for cough or wheezing. 6.  Return to the ER for worsening symptoms, persistent vomiting, difficulty breathing or other concerns.

## 2020-12-30 ENCOUNTER — Telehealth: Payer: Self-pay | Admitting: *Deleted

## 2020-12-30 NOTE — Telephone Encounter (Signed)
Son Patrick Jupiter called with PET scan and dr. Baruch Gouty follow up appointments.   Son wrote down appointments and read back the dates and times.

## 2021-01-02 ENCOUNTER — Other Ambulatory Visit: Payer: Self-pay

## 2021-01-02 ENCOUNTER — Ambulatory Visit
Admission: RE | Admit: 2021-01-02 | Discharge: 2021-01-02 | Disposition: A | Discharge status: Home / Self Care | Payer: Medicare Other | Source: Ambulatory Visit | Attending: Radiation Oncology | Admitting: Radiation Oncology

## 2021-01-02 DIAGNOSIS — T17300A Unspecified foreign body in larynx causing asphyxiation, initial encounter: Secondary | ICD-10-CM | POA: Diagnosis not present

## 2021-01-02 DIAGNOSIS — R1310 Dysphagia, unspecified: Secondary | ICD-10-CM | POA: Diagnosis not present

## 2021-01-02 DIAGNOSIS — C313 Malignant neoplasm of sphenoid sinus: Secondary | ICD-10-CM | POA: Diagnosis not present

## 2021-01-02 DIAGNOSIS — R6339 Other feeding difficulties: Secondary | ICD-10-CM | POA: Diagnosis not present

## 2021-01-02 DIAGNOSIS — J392 Other diseases of pharynx: Secondary | ICD-10-CM | POA: Insufficient documentation

## 2021-01-02 NOTE — Progress Notes (Signed)
Modified Barium Swallow Progress Note  Patient Details  Name: Marcus Wright MRN: [id number] Date of Birth: 12/19/1955  Today's Date: 01/02/2021  Modified Barium Swallow completed.  Full report located under Chart Review in the Imaging Section.  Brief recommendations include the following:  Clinical Impression  Pt presents with moderate sensori-motor oropharyngeal dysphagia that is likely the result of post radiation treatment. Pt's oral phase is c/b decreased oral containment of boluses, reduce posterior propulsion that results in premature spillage of nectar thick liquids via cup, thin liquids via cup, puree, graham cracker and crushed barium tablet. Pt's pharyngeal phase is c/b delayed swallow initiation to the pyriform sinuses, minimal hyolaryngeal movement, decreased epiglottic deflection, and reduced pharyngeal paretalsis. This results in deep penetration of all bolus and penetrates remaining in the laryngeal vestibule. Additionally, pt has moderate amounts of pharyngeal residue throughout the pharynx with very little bolus transit thru UES. Aspiration precautions were reviewed with pt and his granddaughter, along with recommendation to consuming only soft foods. Recommend pt have GI assessment of UES as will as course of Outpatient ST services aimed at increasing pharyngeal strength.   Swallow Evaluation Recommendations   Recommended Consults: Consider GI evaluation;Consider esophageal assessment   SLP Diet Recommendations: Dysphagia 2 (Fine chop) solids;Thin liquid   Liquid Administration via: Cup;Straw   Medication Administration: Crushed with puree   Supervision: Patient able to self feed   Compensations: Minimize environmental distractions;Slow rate;Small sips/bites   Postural Changes: Remain semi-upright after after feeds/meals (Comment);Seated upright at 90 degrees   Oral Care Recommendations: Oral care BID      Marcus Wright Marcus Wright M.S., CCC-SLP, [place] [facility] [place] 01/02/2021,3:42 PM

## 2021-01-07 ENCOUNTER — Other Ambulatory Visit: Payer: Self-pay

## 2021-01-07 ENCOUNTER — Ambulatory Visit
Admission: RE | Admit: 2021-01-07 | Discharge: 2021-01-07 | Disposition: A | Discharge status: Home / Self Care | Payer: Medicare Other | Source: Ambulatory Visit | Attending: Radiation Oncology | Admitting: Radiation Oncology

## 2021-01-07 DIAGNOSIS — C313 Malignant neoplasm of sphenoid sinus: Secondary | ICD-10-CM

## 2021-01-07 DIAGNOSIS — J392 Other diseases of pharynx: Secondary | ICD-10-CM | POA: Insufficient documentation

## 2021-01-07 DIAGNOSIS — R918 Other nonspecific abnormal finding of lung field: Secondary | ICD-10-CM | POA: Diagnosis not present

## 2021-01-07 DIAGNOSIS — C119 Malignant neoplasm of nasopharynx, unspecified: Secondary | ICD-10-CM | POA: Diagnosis not present

## 2021-01-07 DIAGNOSIS — I700 Atherosclerosis of aorta: Secondary | ICD-10-CM | POA: Insufficient documentation

## 2021-01-07 LAB — GLUCOSE, CAPILLARY: Glucose-Capillary: 93 mg/dL (ref 70–99)

## 2021-01-07 MED ORDER — FLUDEOXYGLUCOSE F - 18 (FDG) INJECTION
8.4000 | Freq: Once | INTRAVENOUS | Status: AC | PRN
  Administered 2021-01-07: 8.579 via INTRAVENOUS

## 2021-01-12 ENCOUNTER — Other Ambulatory Visit: Payer: Self-pay

## 2021-01-12 ENCOUNTER — Ambulatory Visit (INDEPENDENT_AMBULATORY_CARE_PROVIDER_SITE_OTHER): Payer: Medicare Other | Admitting: Family Medicine

## 2021-01-12 DIAGNOSIS — C44622 Squamous cell carcinoma of skin of right upper limb, including shoulder: Secondary | ICD-10-CM

## 2021-01-12 NOTE — Progress Notes (Signed)
Had not heard from dermatology for follow up on his squamous cell. No sign of infection at biopsy site. Appointment with dermatology scheduled for tomorrow.

## 2021-01-12 NOTE — Patient Instructions (Addendum)
The spot on your arm showed skin cancer (Squamous cell) which is probably why it's not fully healing. You need to go see the dermatologist. We scheduled your appointment for you.   [facility] Derm[facility]ology 10AM Tomorrow 4/19 [address], [place], [place] [address] Phone: [phone]  Your ear is draining again, but it doesn't look infected- it looks like the drainage from the radiation. Try to keep your finger out of it so it doesn't get infected and follow up with Dr. Marcheta Grammes

## 2021-01-13 DIAGNOSIS — C44622 Squamous cell carcinoma of skin of right upper limb, including shoulder: Secondary | ICD-10-CM | POA: Diagnosis not present

## 2021-01-14 ENCOUNTER — Encounter: Payer: Self-pay | Admitting: Radiation Oncology

## 2021-01-14 ENCOUNTER — Ambulatory Visit
Admission: RE | Admit: 2021-01-14 | Discharge: 2021-01-14 | Disposition: A | Discharge status: Home / Self Care | Payer: Medicare Other | Source: Ambulatory Visit | Attending: Radiation Oncology | Admitting: Radiation Oncology

## 2021-01-14 ENCOUNTER — Other Ambulatory Visit: Payer: Self-pay | Admitting: *Deleted

## 2021-01-14 VITALS — BP 111/78 | HR 75 | Temp 97.2°F | Resp 16 | Wt 165.8 lb

## 2021-01-14 DIAGNOSIS — H9190 Unspecified hearing loss, unspecified ear: Secondary | ICD-10-CM | POA: Insufficient documentation

## 2021-01-14 DIAGNOSIS — J392 Other diseases of pharynx: Secondary | ICD-10-CM | POA: Diagnosis not present

## 2021-01-14 DIAGNOSIS — Z85828 Personal history of other malignant neoplasm of skin: Secondary | ICD-10-CM | POA: Diagnosis not present

## 2021-01-14 DIAGNOSIS — C313 Malignant neoplasm of sphenoid sinus: Secondary | ICD-10-CM

## 2021-01-14 NOTE — Progress Notes (Signed)
Radiation Oncology Follow up Note  Name: Marcus Wright   Date:   01/14/2021 MRN:  [id number] DOB: 30-Jun-1956    This 65 y.o. male presents to the clinic today for follow-up of PET results and patient with questionable recurrence of nasopharyngeal cancer treated 20 years prior.  REFERRING PROVIDER: Valerie Roys, DO  HPI: Patient is a 65 year old male.  Saw in consultation back 330.  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  He has profound hearing loss.  He has been followed by ENT through [facility] in [place] Dr. Renaldo Reel OT n [facility].  He was found to have granulation tissue in this region had endoscopy with only benign material sampled.  He has profound hearing loss.  I sent him for a swallowing study with recommendation for GI evaluation secondary to sensorimotor oropharyngeal dysphagia likely result of post radiation treatment.  I ordered a PET CT scan showing no mass involving the right nasopharynx with lateral extensions of parapharyngeal masticator and carotid space which is hypermetabolic consistent with malignancy.  No hypermetabolic adenopathy is identified.  I have personally talked to ENT who believes further biopsy would not be possible this mass is in close approximation to the carotid bulb.  COMPLICATIONS OF TREATMENT: none  FOLLOW UP COMPLIANCE: keeps appointments   PHYSICAL EXAM:  BP 111/78 (BP Location: Left Arm, Patient Position: Sitting)   Pulse 75   Temp (!) 97.2 F (36.2 C) (Tympanic)   Resp 16   Wt 165 lb 12.8 oz (75.2 kg)   BMI 21.87 kg/m  Well-developed well-nourished patient in NAD. HEENT reveals PERLA, EOMI, discs not visualized.  Oral cavity is clear. No oral mucosal lesions are identified. Neck is clear without evidence of cervical or supraclavicular adenopathy. Lungs are clear to A&P. Cardiac examination is essentially unremarkable with regular rate and rhythm without  murmur rub or thrill. Abdomen is benign with no organomegaly or masses noted. Motor sensory and DTR levels are equal and symmetric in the upper and lower extremities. Cranial nerves II through XII are grossly intact. Proprioception is intact. No peripheral adenopathy or edema is identified. No motor or sensory levels are noted. Crude visual fields are within normal range.  RADIOLOGY RESULTS: Swallowing study and PET CT scan reviewed  PLAN: At this time I am referring the patient to medical oncology.  I have discussed with ENT about repeating scans in 3 months and doing again another endoscopy in 3 months.  We will also present the patient at tumor board and elicit recommendations from radiology on possible biopsy.  Patient is aware of my recommendations.  He already has a follow-up appoint with ENT.  Also will be making medical oncology referral.  I would like to take this opportunity to thank you for allowing me to participate in the care of your patient.Noreene Filbert, MD

## 2021-01-15 ENCOUNTER — Other Ambulatory Visit: Payer: Self-pay | Admitting: *Deleted

## 2021-01-16 DIAGNOSIS — J392 Other diseases of pharynx: Secondary | ICD-10-CM | POA: Diagnosis not present

## 2021-01-16 DIAGNOSIS — Y842 Radiological procedure and radiotherapy as the cause of abnormal reaction of the patient, or of later complication, without mention of misadventure at the time of the procedure: Secondary | ICD-10-CM | POA: Diagnosis not present

## 2021-01-16 DIAGNOSIS — J3489 Other specified disorders of nose and nasal sinuses: Secondary | ICD-10-CM | POA: Diagnosis not present

## 2021-01-16 DIAGNOSIS — H9211 Otorrhea, right ear: Secondary | ICD-10-CM | POA: Diagnosis not present

## 2021-01-16 DIAGNOSIS — J343 Hypertrophy of nasal turbinates: Secondary | ICD-10-CM | POA: Diagnosis not present

## 2021-01-16 DIAGNOSIS — J342 Deviated nasal septum: Secondary | ICD-10-CM | POA: Diagnosis not present

## 2021-01-16 DIAGNOSIS — M8738 Other secondary osteonecrosis, other site: Secondary | ICD-10-CM | POA: Diagnosis not present

## 2021-01-16 DIAGNOSIS — H906 Mixed conductive and sensorineural hearing loss, bilateral: Secondary | ICD-10-CM | POA: Diagnosis not present

## 2021-01-16 DIAGNOSIS — H61893 Other specified disorders of external ear, bilateral: Secondary | ICD-10-CM | POA: Diagnosis not present

## 2021-01-16 NOTE — Progress Notes (Addendum)
[place]  [phone]) [phone] [phone]) [phone]  ID: Jakory Spedding OB: 03/01/1956  MR#: [id number]  [id number]  Patient Care Team: Valerie Roys, DO as PCP - General (Family Medicine) Clyde Canterbury, MD as Referring Physician (Otolaryngology)  CHIEF COMPLAINT: Right nasopharyngeal mass.  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  Patient noted to have increasing neck pain which led to PET scan revealing a hypermetabolic area highly suspicious for recurrence.  Much of the history is given by his grand daughter.  He otherwise feels well.  He has no neurologic complaints.  He denies any recent fevers or illnesses.  He has a fair appetite, but denies weight loss.  He has no dysphagia.  He has no chest pain, shortness of breath, cough, or hemoptysis.  Patient denies any nausea, vomiting, constipation, or diarrhea.  He has no urinary complaints.  Patient offers no further specific complaints today.  REVIEW OF SYSTEMS:   Review of Systems  Constitutional: Negative.  Negative for fever, malaise/fatigue and weight loss.  Respiratory: Negative.  Negative for cough, hemoptysis and shortness of breath.   Cardiovascular: Negative.  Negative for chest pain and leg swelling.  Gastrointestinal: Negative.  Negative for abdominal pain.  Genitourinary: Negative.  Negative for dysuria.  Musculoskeletal: Positive for neck pain.  Skin: Negative.  Negative for rash.  Neurological: Negative.  Negative for dizziness, focal weakness, weakness and headaches.  Psychiatric/Behavioral: Negative.  The patient is not nervous/anxious.     As per HPI. Otherwise, a complete review of systems is negative.  PAST MEDICAL HISTORY: Past Medical History:  Diagnosis Date  . Cancer [facility]) 2003   skin'  . Hyperlipidemia   . Hypertension   . Hypothyroidism   .  Thyroid disorder   . Wears dentures    full upper  . Wears hearing aid     PAST SURGICAL HISTORY: Past Surgical History:  Procedure Laterality Date  . BACK SURGERY  2011  . BREAST SURGERY Right 11-22-12  . CHOLECYSTECTOMY    . COLONOSCOPY    . COLONOSCOPY WITH PROPOFOL N/A 07/19/2016   Procedure: COLONOSCOPY WITH PROPOFOL;  Surgeon: Lucilla Lame, MD;  Location: [place];  Service: Endoscopy;  Laterality: N/A;  . REPLACEMENT TOTAL KNEE Right 1993  . SINUS ENDO W/FUSION Right 09/03/2020   Procedure: Removal of Right nasopharyngeal mass, Biopsy and frozen sections;  Surgeon: Jason Coop, DO;  Location: [place];  Service: ENT;  Laterality: Right;  . SINUS SURGERY WITH INSTATRAK  2003   cancer    FAMILY HISTORY: No family history on file.  ADVANCED DIRECTIVES (Y/N):  N  HEALTH MAINTENANCE: Social History   Tobacco Use  . Smoking status: Former Research scientist (life sciences)  . Smokeless tobacco: Never Used  . Tobacco comment: quit 20+ yrs ago  Vaping Use  . Vaping Use: Never used  Substance Use Topics  . Alcohol use: Yes    Comment: on occasion, 1-2 beers or wine occ.   . Drug use: No     Colonoscopy:  PAP:  Bone density:  Lipid panel:  Allergies  Allergen Reactions  . Doxazosin Hives    Unknown     Current Outpatient Medications  Medication Sig Dispense Refill  . albuterol (VENTOLIN HFA) 108 (90 Base) MCG/ACT inhaler Inhale 2 puffs into the lungs every 4 (four) hours as needed for wheezing or shortness of breath. 1 each 0  .  amLODipine (NORVASC) 2.5 MG tablet TAKE 1 TABLET BY MOUTH EVERY DAY 90 tablet 1  . chlorpheniramine-HYDROcodone (TUSSIONEX PENNKINETIC ER) 10-8 MG/5ML SUER Take 5 mLs by mouth 2 (two) times daily. 70 mL 0  . doxycycline (VIBRA-TABS) 100 MG tablet Take 1 tablet (100 mg total) by mouth 2 (two) times daily. 20 tablet 0  . levothyroxine (SYNTHROID) 50 MCG tablet TAKE 1 TABLET BY MOUTH EVERY DAY (Patient taking differently: Take 50 mcg by mouth daily.) 90  tablet 3  . lidocaine (XYLOCAINE) 2 % solution Use as directed 10 mLs in the mouth or throat every 4 (four) hours as needed for mouth pain. Gargle and swallow 200 mL 0  . magic mouthwash SOLN Take 5 mLs by mouth 3 (three) times daily as needed for mouth pain. 75 mL 0  . nystatin (MYCOSTATIN) 100000 UNIT/ML suspension Take 5 mLs (500,000 Units total) by mouth 4 (four) times daily. 60 mL 0  . ofloxacin (OCUFLOX) 0.3 % ophthalmic solution Place 3 drops in right ear 3 times daily x 7 days 10 mL 0  . predniSONE (DELTASONE) 50 MG tablet Take 1 tablet (50 mg total) by mouth daily with breakfast. 5 tablet 0  . rosuvastatin (CRESTOR) 20 MG tablet Take 1 tablet (20 mg total) by mouth daily. 90 tablet 1  . sucralfate (CARAFATE) 1 GM/10ML suspension Take 10 mLs (1 g total) by mouth 4 (four) times daily -  with meals and at bedtime. 420 mL 0   No current facility-administered medications for this visit.    OBJECTIVE: Vitals:   01/21/21 1112  BP: 125/79  Pulse: 76  Resp: 17  Temp: 99.1 F (37.3 C)  SpO2: 100%     Body mass index is 21.87 kg/m.    ECOG FS:1 - Symptomatic but completely ambulatory  General: Well-developed, well-nourished, no acute distress. Eyes: Pink conjunctiva, anicteric sclera. HEENT: Normocephalic, moist mucous membranes.  No palpable lymphadenopathy. Lungs: No audible wheezing or coughing. Heart: Regular rate and rhythm. Abdomen: Soft, nontender, no obvious distention. Musculoskeletal: No edema, cyanosis, or clubbing. Neuro: Alert, answering all questions appropriately. Cranial nerves grossly intact. Skin: No rashes or petechiae noted. Psych: Normal affect. Lymphatics: No cervical, calvicular, axillary or inguinal LAD.   LAB RESULTS:  Lab Results  Component Value Date   NA 136 12/19/2020   K 3.8 12/19/2020   CL 100 12/19/2020   CO2 27 12/19/2020   GLUCOSE 120 (H) 12/19/2020   BUN 11 12/19/2020   CREATININE 0.81 12/19/2020   CALCIUM 9.6 12/19/2020   PROT 7.3  12/19/2020   ALBUMIN 4.7 12/19/2020   AST 24 12/19/2020   ALT 18 12/19/2020   ALKPHOS 63 12/19/2020   BILITOT 0.6 12/19/2020   GFRNONAA >60 12/19/2020   GFRAA 99 06/03/2020    Lab Results  Component Value Date   WBC 8.4 12/19/2020   NEUTROABS 4.9 12/19/2020   HGB 11.6 (L) 12/19/2020   HCT 34.4 (L) 12/19/2020   MCV 83.9 12/19/2020   PLT 257 12/19/2020     STUDIES: DG Chest 2 View  Result Date: 12/26/2020 CLINICAL DATA:  Cough EXAM: CHEST - 2 VIEW COMPARISON:  08/31/2020 FINDINGS: Cardiac shadow is within normal limits. Aortic calcifications are noted. Lungs are well aerated bilaterally. No bony abnormality is noted. IMPRESSION: No active cardiopulmonary disease. Electronically Signed   By: Inez Catalina M.D.   On: 12/26/2020 02:35   DG Carlena Hurl OP MEDICARE SPEECH PATH  Result Date: 01/02/2021 Objective Swallowing Evaluation: Type of Study: MBS-Modified Barium  Swallow Study  Patient Details Name: Layn Plett MRN: [id number] Date of Birth: 06-23-1956 Today's Date: 01/02/2021 Time: SLP Start Time (ACUTE ONLY): 1250 -SLP Stop Time (ACUTE ONLY): 1310 SLP Time Calculation (min) (ACUTE ONLY): 20 min Past Medical History: Past Medical History: Diagnosis Date . Cancer [facility]) 2003  skin' . Hyperlipidemia  . Hypertension  . Hypothyroidism  . Thyroid disorder  . Wears dentures   full upper . Wears hearing aid  Past Surgical History: Past Surgical History: Procedure Laterality Date . BACK SURGERY  2011 . BREAST SURGERY Right 11-22-12 . CHOLECYSTECTOMY   . COLONOSCOPY   . COLONOSCOPY WITH PROPOFOL N/A 07/19/2016  Procedure: COLONOSCOPY WITH PROPOFOL;  Surgeon: Lucilla Lame, MD;  Location: [place];  Service: Endoscopy;  Laterality: N/A; . REPLACEMENT TOTAL KNEE Right 1993 . SINUS ENDO W/FUSION Right 09/03/2020  Procedure: Removal of Right nasopharyngeal mass, Biopsy and frozen sections;  Surgeon: Jason Coop, DO;  Location: [place];  Service: ENT;  Laterality: Right; . SINUS SURGERY  WITH INSTATRAK  2003  cancer HPI: Patient is a 65 year old male with history of head and neck cancer squamous cell carcinoma believe of the sinus treated with concurrent chemoradiation therapy 20 years prior.  He has profound hearing loss.  He has been followed by ENT at [facility].  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  He is also had a tympanomastoidectomy on the left with ossicular chain risk reconstruction.  He recently presented with increased disequilibrium and a CT scan showed questionable mass in the nasopharynx.  Biopsy of this area were negative for malignancy.  Subjective: pt pleasant, hypernasal speech, nasal turbulance, HOH Assessment / Plan / Recommendation CHL IP CLINICAL IMPRESSIONS 01/02/2021 Clinical Impression Pt presents with moderate sensori-motor oropharyngeal dysphagia that is likely the result of post radiation treatment. Pt's oral phase is c/b decreased oral containment of boluses, reduce posterior propulsion that results in premature spillage of nectar thick liquids via cup, thin liquids via cup, puree, graham cracker and crushed barium tablet. Pt's pharyngeal phase is c/b delayed swallow initiation to the pyriform sinuses, minimal hyolaryngeal movement, decreased epiglottic deflection, and reduced pharyngeal paratalsis. This results in deep penetration of all bolus and penetrates remaining in the larygneal vestibule. Additionally, pt has moderate amounts of pharyngeal residue throughout the pharynx with very little bolus transit thru UES. Aspiration precautions were reviewed with pt and his granddaughter, along with recommendation to consuming only soft foods. Recommend pt have GI assessment of UES as will as course of Outpatient ST services aimed at increasing pharyngeal strength. SLP Visit Diagnosis Dysphagia, oropharyngeal phase (R13.12) Attention and concentration deficit following -- Frontal lobe and executive  function deficit following -- Impact on safety and function Moderate aspiration risk   CHL IP TREATMENT RECOMMENDATION 01/02/2021 Treatment Recommendations Defer treatment plan to f/u with SLP   Prognosis 01/02/2021 Prognosis for Safe Diet Advancement Fair Barriers to Reach Goals Time post onset;Severity of deficits Barriers/Prognosis Comment s/p radiation CHL IP DIET RECOMMENDATION 01/02/2021 SLP Diet Recommendations Dysphagia 2 (Fine chop) solids;Thin liquid Liquid Administration via Cup;Straw Medication Administration Crushed with puree Compensations Minimize environmental distractions;Slow rate;Small sips/bites Postural Changes Remain semi-upright after after feeds/meals (Comment);Seated upright at 90 degrees   CHL IP OTHER RECOMMENDATIONS 01/02/2021 Recommended Consults Consider GI evaluation;Consider esophageal assessment Oral Care Recommendations Oral care BID Other Recommendations --   CHL IP FOLLOW UP RECOMMENDATIONS 01/02/2021 Follow up Recommendations Outpatient SLP   CHL IP FREQUENCY AND DURATION 09/04/2020  Speech Therapy Frequency (ACUTE ONLY) min 1 x/week Treatment Duration 1 week      CHL IP ORAL PHASE 01/02/2021 Oral Phase Impaired Oral - Pudding Teaspoon -- Oral - Pudding Cup -- Oral - Honey Teaspoon -- Oral - Honey Cup -- Oral - Nectar Teaspoon -- Oral - Nectar Cup Weak lingual manipulation;Reduced posterior propulsion;Piecemeal swallowing;Decreased velopharyngeal closure;Delayed oral transit;Decreased bolus cohesion;Premature spillage Oral - Nectar Straw -- Oral - Thin Teaspoon -- Oral - Thin Cup Weak lingual manipulation;Reduced posterior propulsion;Piecemeal swallowing;Decreased velopharyngeal closure;Delayed oral transit;Decreased bolus cohesion;Premature spillage Oral - Thin Straw -- Oral - Puree Weak lingual manipulation;Reduced posterior propulsion;Piecemeal swallowing;Decreased velopharyngeal closure;Delayed oral transit;Decreased bolus cohesion;Premature spillage Oral - Mech Soft Weak lingual  manipulation;Impaired mastication;Reduced posterior propulsion;Lingual/palatal residue;Piecemeal swallowing;Delayed oral transit;Decreased bolus cohesion;Premature spillage;Decreased velopharyngeal closure Oral - Regular -- Oral - Multi-Consistency -- Oral - Pill Weak lingual manipulation;Reduced posterior propulsion;Piecemeal swallowing;Delayed oral transit;Decreased bolus cohesion;Premature spillage Oral Phase - Comment --  CHL IP PHARYNGEAL PHASE 01/02/2021 Pharyngeal Phase Impaired Pharyngeal- Pudding Teaspoon -- Pharyngeal -- Pharyngeal- Pudding Cup -- Pharyngeal -- Pharyngeal- Honey Teaspoon -- Pharyngeal -- Pharyngeal- Honey Cup -- Pharyngeal -- Pharyngeal- Nectar Teaspoon -- Pharyngeal -- Pharyngeal- Nectar Cup Delayed swallow initiation-pyriform sinuses;Reduced pharyngeal peristalsis;Reduced epiglottic inversion;Reduced anterior laryngeal mobility;Reduced laryngeal elevation;Reduced airway/laryngeal closure;Reduced tongue base retraction;Penetration/Aspiration during swallow;Pharyngeal residue - valleculae;Pharyngeal residue - pyriform;Pharyngeal residue - posterior pharnyx;Pharyngeal residue - cp segment;Inter-arytenoid space residue Pharyngeal Material enters airway, remains ABOVE vocal cords and not ejected out Pharyngeal- Nectar Straw -- Pharyngeal -- Pharyngeal- Thin Teaspoon -- Pharyngeal -- Pharyngeal- Thin Cup Delayed swallow initiation-pyriform sinuses;Reduced pharyngeal peristalsis;Reduced epiglottic inversion;Reduced anterior laryngeal mobility;Reduced laryngeal elevation;Reduced airway/laryngeal closure;Reduced tongue base retraction;Penetration/Aspiration during swallow;Pharyngeal residue - valleculae;Pharyngeal residue - pyriform;Pharyngeal residue - posterior pharnyx;Pharyngeal residue - cp segment;Inter-arytenoid space residue Pharyngeal Material enters airway, remains ABOVE vocal cords and not ejected out Pharyngeal- Thin Straw -- Pharyngeal -- Pharyngeal- Puree Delayed swallow  initiation-pyriform sinuses;Reduced pharyngeal peristalsis;Reduced epiglottic inversion;Reduced anterior laryngeal mobility;Reduced laryngeal elevation;Reduced airway/laryngeal closure;Reduced tongue base retraction;Penetration/Aspiration during swallow;Pharyngeal residue - valleculae;Pharyngeal residue - pyriform;Pharyngeal residue - posterior pharnyx;Pharyngeal residue - cp segment;Inter-arytenoid space residue Pharyngeal Material enters airway, remains ABOVE vocal cords and not ejected out Pharyngeal- Mechanical Soft Delayed swallow initiation-pyriform sinuses;Reduced pharyngeal peristalsis;Reduced epiglottic inversion;Reduced anterior laryngeal mobility;Reduced laryngeal elevation;Reduced airway/laryngeal closure;Reduced tongue base retraction;Penetration/Aspiration during swallow;Pharyngeal residue - valleculae;Pharyngeal residue - pyriform;Pharyngeal residue - posterior pharnyx;Pharyngeal residue - cp segment;Inter-arytenoid space residue Pharyngeal Material enters airway, remains ABOVE vocal cords and not ejected out Pharyngeal- Regular -- Pharyngeal -- Pharyngeal- Multi-consistency -- Pharyngeal -- Pharyngeal- Pill Delayed swallow initiation-pyriform sinuses;Reduced pharyngeal peristalsis;Reduced epiglottic inversion;Reduced anterior laryngeal mobility;Reduced laryngeal elevation;Reduced airway/laryngeal closure;Reduced tongue base retraction;Penetration/Aspiration during swallow;Pharyngeal residue - valleculae;Pharyngeal residue - pyriform;Pharyngeal residue - posterior pharnyx;Pharyngeal residue - cp segment;Inter-arytenoid space residue Pharyngeal Material enters airway, remains ABOVE vocal cords and not ejected out Pharyngeal Comment --  CHL IP CERVICAL ESOPHAGEAL PHASE 01/02/2021 Cervical Esophageal Phase Impaired Pudding Teaspoon -- Pudding Cup -- Honey Teaspoon -- Honey Cup -- Nectar Teaspoon -- Nectar Cup Reduced cricopharyngeal relaxation Nectar Straw -- Thin Teaspoon -- Thin Cup Reduced  cricopharyngeal relaxation Thin Straw -- Puree Reduced cricopharyngeal relaxation Mechanical Soft Reduced cricopharyngeal relaxation Regular -- Multi-consistency -- Pill Reduced cricopharyngeal relaxation Cervical Esophageal Comment -- Happi Overton 01/02/2021, 3:44 PM            CLINICAL DATA:  Nasopharyngeal mass.  Difficulty swallowing. EXAM: MODIFIED BARIUM SWALLOW TECHNIQUE: Different consistencies of barium were administered orally to the patient by the Speech Pathologist. Imaging of the pharynx  was performed in the lateral projection. The radiologist was present in the fluoroscopy room for this study, providing personal supervision. FLUOROSCOPY TIME:  Fluoroscopy Time:  2.7 minute Radiation Exposure Index (if provided by the fluoroscopic device): 2 mGy Number of Acquired Spot Images: 0 COMPARISON:  None. FINDINGS: Real-time fluoroscopy of the swallowing function was performed with a speech pathologist present. Multiple consistencies of barium were administered which included water, nectar, applesauce, and Graham cracker. Premature spillage with thin and nectar consistency. Delayed oropharyngeal transfer. Deep laryngeal penetration with all consistencies without aspiration. Patient masticated a barium tablet and swallowed the pieces. Patient had difficulty swallowing both cracker consistency and the masticatedr barium tablet after multiple swallowing efforts. IMPRESSION: Modified barium swallow as detailed above. Please refer to the Speech Pathologists report for complete details and recommendations. Electronically Signed   By: Kathreen Devoid   On: 01/02/2021 13:42   NM PET Image Initial (PI) Skull Base To Thigh  Result Date: 01/09/2021 CLINICAL DATA:  Subsequent treatment strategy for nasopharyngeal mass. History of sphenoid sinus/head and neck cancer. No evidence of malignancy on right submandibular nodal core biopsy 12/05/2020. EXAM: NUCLEAR MEDICINE PET SKULL BASE TO THIGH TECHNIQUE: 0.8 0.6 mCi F-18 FDG  was injected intravenously. Full-ring PET imaging was performed from the skull base to thigh after the radiotracer. CT data was obtained and used for attenuation correction and anatomic localization. Fasting blood glucose: 93 mg/dl COMPARISON:  CT neck 12/19/2020 and 11/18/2020. FINDINGS: Mediastinal blood pool activity: SUV max 2.1 NECK: The previously demonstrated mass involving the right nasopharynx and parapharyngeal space is hypermetabolic with an SUV max of 7.9. As before, this mass demonstrates lateral extension into the right masticator and carotid spaces. There is chronic opacification of the right middle ear and mastoid air cells without bone destruction or associated hypermetabolic activity. Prominent submandibular nodes bilaterally demonstrate no hypermetabolic activity. There are no hypermetabolic cervical lymph nodes. There is mild asymmetric metabolic activity along the left aspect of the vallecula without associated focal abnormality on the CT images. No other lesions of the pharyngeal mucosal space identified. Incidental CT findings: As above, chronic opacification of the right mastoid air cells and middle ears. Bilateral carotid atherosclerosis. CHEST: There are no hypermetabolic mediastinal, hilar or axillary lymph nodes. No hypermetabolic pulmonary activity or suspicious nodularity. Incidental CT findings: Atherosclerosis of the aorta, great vessels and coronary arteries. Patchy opacity at the left lung base which could relate to chronic aspiration. ABDOMEN/PELVIS: There is no hypermetabolic activity within the liver, adrenal glands, spleen or pancreas. There is no hypermetabolic nodal activity. Incidental CT findings: Aortic and branch vessel atherosclerosis. Prior cholecystectomy. SKELETON: There is no hypermetabolic activity to suggest osseous metastatic disease. Incidental CT findings: Intramuscular lipoma within the adductor musculature of the proximal right thigh. IMPRESSION: 1. The known  mass involving the right nasopharynx with lateral extension into the parapharyngeal, masticator and carotid spaces is hypermetabolic, consistent with malignancy. 2. No hypermetabolic cervical adenopathy or distant metastases. 3. Mild asymmetric activity along the left aspect of the vallecula, without CT abnormality, probably physiologic. 4.  Aortic Atherosclerosis (ICD10-I70.0). 5. Patchy opacity at the left lung base which could relate to chronic aspiration. Electronically Signed   By: Richardean Sale M.D.   On: 01/09/2021 08:36    ASSESSMENT: Right nasopharyngeal mass.  PLAN:    1. Right nasopharyngeal mass: Patient underwent chemotherapy and XRT nearly 20 years ago for squamous cell carcinoma in the same anatomic area.  He reports being evaluated by ENT and endoscopy did not  reveal anything they could biopsy.  PET scan results from January 09, 2021 reviewed independently and reported as above with hypermetabolic mass highly suspicious for recurrence.  Patient has also been evaluated by radiation oncology.  Patient will likely require repeat biopsy to confirm recurrence of prior to proceeding with additional treatment.  This may be needed to be done by interventional radiology.  Will discuss patient at tumor board on Thursday, January 22, 2021 to help determine next steps.  Follow-up will be based on that discussion.  I spent a total of 60 minutes reviewing chart data, face-to-face evaluation with the patient, counseling and coordination of care as detailed above.   Patient expressed understanding and was in agreement with this plan. He also understands that He can call clinic at any time with any questions, concerns, or complaints.   Cancer Staging No matching staging information was found for the patient.  Lloyd Huger, MD   01/22/2021 6:52 AM   Addendum:  Highly difficult biopsy.  Recommendation from MDT is to symptomatically treat and repeat CT of neck in mid-July.   Lloyd Huger,  MD 01/22/21 1:14 PM

## 2021-01-21 ENCOUNTER — Encounter: Payer: Self-pay | Admitting: Oncology

## 2021-01-21 ENCOUNTER — Encounter (INDEPENDENT_AMBULATORY_CARE_PROVIDER_SITE_OTHER): Payer: Self-pay

## 2021-01-21 ENCOUNTER — Other Ambulatory Visit: Payer: Self-pay

## 2021-01-21 ENCOUNTER — Inpatient Hospital Stay: Payer: Medicare Other

## 2021-01-21 ENCOUNTER — Inpatient Hospital Stay: Payer: Medicare Other | Attending: Oncology | Admitting: Oncology

## 2021-01-21 VITALS — BP 125/79 | HR 76 | Temp 99.1°F | Resp 17 | Ht 73.0 in | Wt 165.8 lb

## 2021-01-21 DIAGNOSIS — I6523 Occlusion and stenosis of bilateral carotid arteries: Secondary | ICD-10-CM | POA: Insufficient documentation

## 2021-01-21 DIAGNOSIS — E785 Hyperlipidemia, unspecified: Secondary | ICD-10-CM | POA: Insufficient documentation

## 2021-01-21 DIAGNOSIS — J392 Other diseases of pharynx: Secondary | ICD-10-CM | POA: Diagnosis not present

## 2021-01-21 DIAGNOSIS — I10 Essential (primary) hypertension: Secondary | ICD-10-CM | POA: Insufficient documentation

## 2021-01-21 DIAGNOSIS — Z87891 Personal history of nicotine dependence: Secondary | ICD-10-CM | POA: Diagnosis not present

## 2021-01-21 DIAGNOSIS — Z85828 Personal history of other malignant neoplasm of skin: Secondary | ICD-10-CM | POA: Insufficient documentation

## 2021-01-21 DIAGNOSIS — Z9221 Personal history of antineoplastic chemotherapy: Secondary | ICD-10-CM | POA: Diagnosis not present

## 2021-01-21 DIAGNOSIS — Z79899 Other long term (current) drug therapy: Secondary | ICD-10-CM | POA: Diagnosis not present

## 2021-01-21 DIAGNOSIS — R221 Localized swelling, mass and lump, neck: Secondary | ICD-10-CM | POA: Diagnosis not present

## 2021-01-21 DIAGNOSIS — I700 Atherosclerosis of aorta: Secondary | ICD-10-CM | POA: Diagnosis not present

## 2021-01-21 DIAGNOSIS — E079 Disorder of thyroid, unspecified: Secondary | ICD-10-CM | POA: Diagnosis not present

## 2021-01-21 DIAGNOSIS — E039 Hypothyroidism, unspecified: Secondary | ICD-10-CM | POA: Diagnosis not present

## 2021-01-21 DIAGNOSIS — Z923 Personal history of irradiation: Secondary | ICD-10-CM | POA: Diagnosis not present

## 2021-01-21 NOTE — Progress Notes (Signed)
Pt has problems with chicken and meat start to choke on them eats pudding and apple sauce , vegetable. No proteins  . Patient does have speech impairment due to RAD hard to understand and also hearing issues

## 2021-01-22 ENCOUNTER — Other Ambulatory Visit: Payer: Medicare Other

## 2021-01-22 NOTE — Addendum Note (Signed)
Addended by: Lloyd Huger on: 01/22/2021 01:20 PM   Modules accepted: Orders

## 2021-01-23 ENCOUNTER — Telehealth: Payer: Self-pay | Admitting: *Deleted

## 2021-01-23 NOTE — Telephone Encounter (Signed)
Call placed to patient to review plan as discussed at tumor conference. Patient to have repeat CT scan in July and follow up with Dr. Grayland Ormond and Dr. Baruch Gouty 1-2 days later. Patients family verbalized understanding and is in agreement with plan.

## 2021-01-23 NOTE — Progress Notes (Signed)
Tumor Board Documentation  Ellen Mayol Arciga was presented by Dr Grayland Ormond at our Tumor Board on 01/22/2021, which included representatives from medical oncology,radiation oncology,surgical oncology,internal medicine,navigation,pathology,radiology,surgical,genetics,research,palliative care,pulmonology.  Norm currently presents as a current patient,for MDC,for new positive pathology with history of the following treatments: neoadjuvant chemoradiation,active survellience.  Additionally, we reviewed previous medical and familial history, history of present illness, and recent lab results along with all available histopathologic and imaging studies. The tumor board considered available treatment options and made the following recommendations: Biopsy (Dr Tami Ribas will look at scans and determine if he can do biopsy) Possible XRT after bx is done versus re evaluation in 3 months with CT  The following procedures/referrals were also placed: No orders of the defined types were placed in this encounter.   Clinical Trial Status: not discussed   Staging used: AJCC Stage Group,To be determined  AJCC Staging:       Group: Nasopharyngeal carinoma of Nasopharynx   National site-specific guidelines NCCN were discussed with respect to the case.  Tumor board is a meeting of clinicians from various specialty areas who evaluate and discuss patients for whom a multidisciplinary approach is being considered. Final determinations in the plan of care are those of the provider(s). The responsibility for follow up of recommendations given during tumor board is that of the provider.   Today's extended care, comprehensive team conference, [place] was not present for the discussion and was not examined.   Multidisciplinary Tumor Board is a multidisciplinary case peer review process.  Decisions discussed in the Multidisciplinary Tumor Board reflect the opinions of the specialists present at the conference without  having examined the patient.  Ultimately, treatment and diagnostic decisions rest with the primary provider(s) and the patient.

## 2021-01-24 ENCOUNTER — Other Ambulatory Visit: Payer: Self-pay | Admitting: Family Medicine

## 2021-01-24 NOTE — Telephone Encounter (Signed)
Requested Prescriptions  Pending Prescriptions Disp Refills  . rosuvastatin (CRESTOR) 20 MG tablet [Pharmacy Med Name: ROSUVASTATIN CALCIUM 20 MG TAB] 90 tablet 1    Sig: TAKE 1 TABLET BY MOUTH EVERY DAY     Cardiovascular:  Antilipid - Statins Failed - 01/24/2021  9:31 AM      Failed - Triglycerides in normal range and within 360 days    Triglycerides  Date Value Ref Range Status  06/03/2020 150 (H) 0 - 149 mg/dL Final   Triglycerides Piccolo,Waived  Date Value Ref Range Status  05/21/2016 292 (H) <150 mg/dL Final    Comment:                            Normal                   <150                         Borderline High     150 - 199                         High                200 - 499                         Very High                >499          Passed - Total Cholesterol in normal range and within 360 days    Cholesterol, Total  Date Value Ref Range Status  06/03/2020 138 100 - 199 mg/dL Final   Cholesterol Piccolo, Waived  Date Value Ref Range Status  05/21/2016 251 (H) <200 mg/dL Final    Comment:                            Desirable                <200                         Borderline High      200- 239                         High                     >239          Passed - LDL in normal range and within 360 days    LDL Chol Calc (NIH)  Date Value Ref Range Status  06/03/2020 70 0 - 99 mg/dL Final         Passed - HDL in normal range and within 360 days    HDL  Date Value Ref Range Status  06/03/2020 42 >39 mg/dL Final         Passed - Patient is not pregnant      Passed - Valid encounter within last 12 months    Recent Outpatient Visits          1 week ago Squamous cell carcinoma of skin of right upper extremity, including shoulder   [place], Megan P, DO   1 month ago Non-healing  wound of right upper extremity   [place], Megan P, DO   1 month ago Cellulitis of right ear   [place], Lauren A, NP   1 month ago Cellulitis of right ear   [place], DO   2 months ago Cellulitis of right ear   [facility] Valerie Roys, DO      Future Appointments            In 1 week Wynetta Emery, Barb Merino, DO [facility], [facility]

## 2021-02-04 ENCOUNTER — Ambulatory Visit: Payer: Medicare Other | Admitting: Oncology

## 2021-02-04 ENCOUNTER — Other Ambulatory Visit: Payer: Medicare Other

## 2021-02-04 ENCOUNTER — Ambulatory Visit: Payer: Medicare Other | Admitting: Radiation Oncology

## 2021-02-05 ENCOUNTER — Encounter: Payer: Self-pay | Admitting: Family Medicine

## 2021-02-05 ENCOUNTER — Ambulatory Visit (INDEPENDENT_AMBULATORY_CARE_PROVIDER_SITE_OTHER): Payer: Medicare Other | Admitting: Family Medicine

## 2021-02-05 ENCOUNTER — Other Ambulatory Visit: Payer: Self-pay

## 2021-02-05 VITALS — BP 104/64 | HR 75 | Temp 98.2°F | Wt 163.4 lb

## 2021-02-05 DIAGNOSIS — E782 Mixed hyperlipidemia: Secondary | ICD-10-CM

## 2021-02-05 DIAGNOSIS — I10 Essential (primary) hypertension: Secondary | ICD-10-CM

## 2021-02-05 DIAGNOSIS — Z23 Encounter for immunization: Secondary | ICD-10-CM | POA: Diagnosis not present

## 2021-02-05 DIAGNOSIS — R3911 Hesitancy of micturition: Secondary | ICD-10-CM | POA: Diagnosis not present

## 2021-02-05 DIAGNOSIS — E079 Disorder of thyroid, unspecified: Secondary | ICD-10-CM

## 2021-02-05 LAB — URINALYSIS, ROUTINE W REFLEX MICROSCOPIC
Bilirubin, UA: NEGATIVE
Glucose, UA: NEGATIVE
Ketones, UA: NEGATIVE
Leukocytes,UA: NEGATIVE
Nitrite, UA: NEGATIVE
Protein,UA: NEGATIVE
RBC, UA: NEGATIVE
Specific Gravity, UA: 1.015 (ref 1.005–1.030)
Urobilinogen, Ur: 0.2 mg/dL (ref 0.2–1.0)
pH, UA: 7 (ref 5.0–7.5)

## 2021-02-05 LAB — MICROALBUMIN, URINE WAIVED
Creatinine, Urine Waived: 200 mg/dL (ref 10–300)
Microalb, Ur Waived: 30 mg/L — ABNORMAL HIGH (ref 0–19)
Microalb/Creat Ratio: <30 mg/g (ref <30)

## 2021-02-05 MED ORDER — AMLODIPINE BESYLATE 2.5 MG PO TABS: 2.5000 mg | ORAL_TABLET | Freq: Every day | ORAL | 1 refills | Status: DC

## 2021-02-05 MED ORDER — FLUTICASONE PROPIONATE 50 MCG/ACT NA SUSP: 2.0000 | Freq: Every day | NASAL | 6 refills | Status: DC

## 2021-02-05 NOTE — Patient Instructions (Signed)
Pneumococcal Conjugate Vaccine (Prevnar 13) Suspension for Injection What is this medicine? PNEUMOCOCCAL VACCINE (NEU mo KOK al vak SEEN) is a vaccine used to prevent pneumococcus bacterial infections. These bacteria can cause serious infections like pneumonia, meningitis, and blood infections. This vaccine will lower your chance of getting pneumonia. If you do get pneumonia, it can make your symptoms milder and your illness shorter. This vaccine will not treat an infection and will not cause infection. This vaccine is recommended for infants and young children, adults with certain medical conditions, and adults 65 years or older. This medicine may be used for other purposes; ask your health care provider or pharmacist if you have questions. COMMON BRAND NAME(S): Prevnar, Prevnar 13 What should I tell my health care provider before I take this medicine? They need to know if you have any of these conditions:  bleeding problems  fever  immune system problems  an unusual or allergic reaction to pneumococcal vaccine, diphtheria toxoid, other vaccines, latex, other medicines, foods, dyes, or preservatives  pregnant or trying to get pregnant  breast-feeding How should I use this medicine? This vaccine is for injection into a muscle. It is given by a health care professional. A copy of Vaccine Information Statements will be given before each vaccination. Read this sheet carefully each time. The sheet may change frequently. Talk to your pediatrician regarding the use of this medicine in children. While this drug may be prescribed for children as young as 6 weeks old for selected conditions, precautions do apply. Overdosage: If you think you have taken too much of this medicine contact a poison control center or emergency room at once. NOTE: This medicine is only for you. Do not share this medicine with others. What if I miss a dose? It is important not to miss your dose. Call your doctor or health  care professional if you are unable to keep an appointment. What may interact with this medicine?  medicines for cancer chemotherapy  medicines that suppress your immune function  steroid medicines like prednisone or cortisone This list may not describe all possible interactions. Give your health care provider a list of all the medicines, herbs, non-prescription drugs, or dietary supplements you use. Also tell them if you smoke, drink alcohol, or use illegal drugs. Some items may interact with your medicine. What should I watch for while using this medicine? Mild fever and pain should go away in 3 days or less. Report any unusual symptoms to your doctor or health care professional. What side effects may I notice from receiving this medicine? Side effects that you should report to your doctor or health care professional as soon as possible:  allergic reactions like skin rash, itching or hives, swelling of the face, lips, or tongue  breathing problems  confused  fast or irregular heartbeat  fever over 102 degrees F  seizures  unusual bleeding or bruising  unusual muscle weakness Side effects that usually do not require medical attention (report to your doctor or health care professional if they continue or are bothersome):  aches and pains  diarrhea  fever of 102 degrees F or less  headache  irritable  loss of appetite  pain, tender at site where injected  trouble sleeping This list may not describe all possible side effects. Call your doctor for medical advice about side effects. You may report side effects to FDA at 1-800-FDA-1088. Where should I keep my medicine? This does not apply. This vaccine is given in a clinic, pharmacy,   doctor's office, or other health care setting and will not be stored at home. NOTE: This sheet is a summary. It may not cover all possible information. If you have questions about this medicine, talk to your doctor, pharmacist, or health care  provider.  2021 Elsevier/Gold Standard (2014-06-20 10:27:27)  

## 2021-02-05 NOTE — Addendum Note (Signed)
Addended by: Louanna Raw on: 02/05/2021 02:56 PM   Modules accepted: Orders

## 2021-02-05 NOTE — Progress Notes (Signed)
BP 104/64   Pulse 75   Temp 98.2 F (36.8 C)   Wt 163 lb 6.4 oz (74.1 kg)   SpO2 97%   BMI 21.56 kg/m    Subjective:    Patient ID: Marcus Wright, male    DOB: 1956/07/29, 65 y.o.   MRN: [id number]  HPI: Marcus Wright is a 65 y.o. male  Chief Complaint  Patient presents with  . Hypertension  . Hyperlipidemia   HYPERTENSION / HYPERLIPIDEMIA Satisfied with current treatment? yes Duration of hypertension: chronic BP monitoring frequency: not checking BP medication side effects: no Past BP meds: amlodipine Duration of hyperlipidemia: chronic Cholesterol medication side effects: no Cholesterol supplements: none Past cholesterol medications: crestor Medication compliance: excellent compliance Aspirin: no Recent stressors: no Recurrent headaches: no Visual changes: no Palpitations: no Dyspnea: no Chest pain: no Lower extremity edema: no Dizzy/lightheaded: no   HYPOTHYROIDISM Thyroid control status:stable Satisfied with current treatment? yes Medication side effects: no Medication compliance: excellent compliance Recent dose adjustment:no Fatigue: no Cold intolerance: no Heat intolerance: no Weight gain: no Weight loss: no Constipation: no Diarrhea/loose stools: no Palpitations: no Lower extremity edema: no Anxiety/depressed mood: no   Relevant past medical, surgical, family and social history reviewed and updated as indicated. Interim medical history since our last visit reviewed. Allergies and medications reviewed and updated.  Review of Systems  Constitutional: Negative.   Respiratory: Negative.   Cardiovascular: Negative.   Gastrointestinal: Negative.   Musculoskeletal: Negative.   Neurological: Negative.   Psychiatric/Behavioral: Negative.     Per HPI unless specifically indicated above     Objective:    BP 104/64   Pulse 75   Temp 98.2 F (36.8 C)   Wt 163 lb 6.4 oz (74.1 kg)   SpO2 97%   BMI 21.56 kg/m   Wt Readings from  Last 3 Encounters:  02/05/21 163 lb 6.4 oz (74.1 kg)  01/21/21 165 lb 12.8 oz (75.2 kg)  01/14/21 165 lb 12.8 oz (75.2 kg)    Physical Exam Vitals and nursing note reviewed.  Constitutional:      General: He is not in acute distress.    Appearance: Normal appearance. He is not ill-appearing, toxic-appearing or diaphoretic.  HENT:     Head: Normocephalic and atraumatic.     Comments: Profoundly deaf    Right Ear: External ear normal.     Left Ear: External ear normal.     Nose: Nose normal.     Mouth/Throat:     Mouth: Mucous membranes are moist.     Pharynx: Oropharynx is clear.  Eyes:     General: No scleral icterus.       Right eye: No discharge.        Left eye: No discharge.     Extraocular Movements: Extraocular movements intact.     Conjunctiva/sclera: Conjunctivae normal.     Pupils: Pupils are equal, round, and reactive to light.  Cardiovascular:     Rate and Rhythm: Normal rate and regular rhythm.     Pulses: Normal pulses.     Heart sounds: Normal heart sounds. No murmur heard. No friction rub. No gallop.   Pulmonary:     Effort: Pulmonary effort is normal. No respiratory distress.     Breath sounds: Normal breath sounds. No stridor. No wheezing, rhonchi or rales.  Chest:     Chest wall: No tenderness.  Musculoskeletal:        General: Normal range of motion.  Cervical back: Normal range of motion and neck supple.  Skin:    General: Skin is warm and dry.     Capillary Refill: Capillary refill takes less than 2 seconds.     Coloration: Skin is not jaundiced or pale.     Findings: No bruising, erythema, lesion or rash.  Neurological:     General: No focal deficit present.     Mental Status: He is alert and oriented to person, place, and time. Mental status is at baseline.  Psychiatric:        Mood and Affect: Mood normal.        Behavior: Behavior normal.        Thought Content: Thought content normal.        Judgment: Judgment normal.     Results for  orders placed or performed during the hospital encounter of 01/07/21  Glucose, capillary  Result Value Ref Range   Glucose-Capillary 93 70 - 99 mg/dL      Assessment & Plan:   Problem List Items Addressed This Visit      Cardiovascular and Mediastinum   Hypertension - Primary    Under good control on current regimen. Continue current regimen. Continue to monitor. Call with any concerns. Refills given. Labs drawn toady.        Relevant Medications   amLODipine (NORVASC) 2.5 MG tablet   Other Relevant Orders   Comprehensive metabolic panel   CBC with Differential/Platelet   Microalbumin, Urine Waived     Endocrine   Thyroid disorder    Rechecking labs drawn today. Await results. Treat as needed.       Relevant Orders   Comprehensive metabolic panel   CBC with Differential/Platelet   TSH     Other   Hyperlipidemia    Under good control on current regimen. Continue current regimen. Continue to monitor. Call with any concerns. Refills given. Labs drawn toady.       Relevant Medications   amLODipine (NORVASC) 2.5 MG tablet   Other Relevant Orders   Comprehensive metabolic panel   CBC with Differential/Platelet   Lipid Panel w/o Chol/HDL Ratio    Other Visit Diagnoses    Hesitancy       Labs drawn today. Await results.    Relevant Orders   CBC with Differential/Platelet   PSA   Urinalysis, Routine w reflex microscopic       Follow up plan: Return in about 6 months (around 08/08/2021) for physical.

## 2021-02-05 NOTE — Assessment & Plan Note (Signed)
Rechecking labs drawn today. Await results. Treat as needed.

## 2021-02-05 NOTE — Assessment & Plan Note (Signed)
Under good control on current regimen. Continue current regimen. Continue to monitor. Call with any concerns. Refills given. Labs drawn toady.

## 2021-02-05 NOTE — Assessment & Plan Note (Signed)
Under good control on current regimen. Continue current regimen. Continue to monitor. Call with any concerns. Refills given. Labs drawn toady.  

## 2021-02-06 ENCOUNTER — Encounter: Payer: Self-pay | Admitting: Family Medicine

## 2021-02-06 ENCOUNTER — Ambulatory Visit (INDEPENDENT_AMBULATORY_CARE_PROVIDER_SITE_OTHER): Payer: Medicare Other | Admitting: Family Medicine

## 2021-02-06 ENCOUNTER — Other Ambulatory Visit: Payer: Self-pay

## 2021-02-06 ENCOUNTER — Ambulatory Visit: Payer: Self-pay

## 2021-02-06 VITALS — BP 107/71 | HR 80 | Temp 98.8°F | Wt 163.0 lb

## 2021-02-06 DIAGNOSIS — T8090XA Unspecified complication following infusion and therapeutic injection, initial encounter: Secondary | ICD-10-CM

## 2021-02-06 LAB — LIPID PANEL W/O CHOL/HDL RATIO
Cholesterol, Total: 148 mg/dL (ref 100–199)
HDL: 44 mg/dL (ref >39)
LDL Chol Calc (NIH): 81 mg/dL (ref 0–99)
Triglycerides: 132 mg/dL (ref 0–149)
VLDL Cholesterol Cal: 23 mg/dL (ref 5–40)

## 2021-02-06 LAB — COMPREHENSIVE METABOLIC PANEL
ALT: 10 IU/L (ref 0–44)
AST: 13 IU/L (ref 0–40)
Albumin/Globulin Ratio: 2.1 (ref 1.2–2.2)
Albumin: 4.6 g/dL (ref 3.8–4.8)
Alkaline Phosphatase: 69 IU/L (ref 44–121)
BUN/Creatinine Ratio: 9 — ABNORMAL LOW (ref 10–24)
BUN: 8 mg/dL (ref 8–27)
Bilirubin Total: 0.4 mg/dL (ref 0.0–1.2)
CO2: 22 mmol/L (ref 20–29)
Calcium: 9.6 mg/dL (ref 8.6–10.2)
Chloride: 98 mmol/L (ref 96–106)
Creatinine, Ser: 0.85 mg/dL (ref 0.76–1.27)
Globulin, Total: 2.2 g/dL (ref 1.5–4.5)
Glucose: 106 mg/dL — ABNORMAL HIGH (ref 65–99)
Potassium: 4.2 mmol/L (ref 3.5–5.2)
Sodium: 142 mmol/L (ref 134–144)
Total Protein: 6.8 g/dL (ref 6.0–8.5)
eGFR: 96 mL/min/{1.73_m2} (ref >59)

## 2021-02-06 LAB — CBC WITH DIFFERENTIAL/PLATELET
Basophils Absolute: 0.1 10*3/uL (ref 0.0–0.2)
Basos: 1 %
EOS (ABSOLUTE): 0.2 10*3/uL (ref 0.0–0.4)
Eos: 3 %
Hematocrit: 35.1 % — ABNORMAL LOW (ref 37.5–51.0)
Hemoglobin: 11.7 g/dL — ABNORMAL LOW (ref 13.0–17.7)
Immature Grans (Abs): 0 10*3/uL (ref 0.0–0.1)
Immature Granulocytes: 0 %
Lymphocytes Absolute: 2.1 10*3/uL (ref 0.7–3.1)
Lymphs: 34 %
MCH: 28.3 pg (ref 26.6–33.0)
MCHC: 33.3 g/dL (ref 31.5–35.7)
MCV: 85 fL (ref 79–97)
Monocytes Absolute: 0.5 10*3/uL (ref 0.1–0.9)
Monocytes: 9 %
Neutrophils Absolute: 3.2 10*3/uL (ref 1.4–7.0)
Neutrophils: 53 %
Platelets: 255 10*3/uL (ref 150–450)
RBC: 4.13 x10E6/uL — ABNORMAL LOW (ref 4.14–5.80)
RDW: 13.1 % (ref 11.6–15.4)
WBC: 6.1 10*3/uL (ref 3.4–10.8)

## 2021-02-06 LAB — TSH: TSH: 0.505 u[IU]/mL (ref 0.450–4.500)

## 2021-02-06 LAB — PSA: Prostate Specific Ag, Serum: 0.4 ng/mL (ref 0.0–4.0)

## 2021-02-06 MED ORDER — DOXYCYCLINE HYCLATE 100 MG PO TABS: 100.0000 mg | ORAL_TABLET | Freq: Two times a day (BID) | ORAL | 0 refills | Status: DC

## 2021-02-06 NOTE — Telephone Encounter (Signed)
Unable to advise beyond heat and tylenol without seeing it- I'm fine to see him if he can come in

## 2021-02-06 NOTE — Telephone Encounter (Signed)
Patient granddaughter states arm is sore and has a knot at area of injection. Made an apt for today at 1:20

## 2021-02-06 NOTE — Progress Notes (Signed)
BP 107/71   Pulse 80   Temp 98.8 F (37.1 C)   Wt 163 lb (73.9 kg)   SpO2 100%   BMI 21.51 kg/m    Subjective:    Patient ID: [name], male    DOB: 1955-11-28, 65 y.o.   MRN: [id number]  HPI: Marcus Wright is a 65 y.o. male  Chief Complaint  Patient presents with  . Arm Pain    Patient states his left arm is sore where he got his shot yesterday. Patient grand daughter states there is a knot on arm.    Shalin had his prevnar yesterday and started with pain and swelling of his L arm last night. No redness or heat, but he feels like he can't lift his arm and it's very tender to touch. Worse with touch. Nothing makes it better, but he hasn't tried anything. No radiation of pain. Pain has been constant. No other concerns or complaints today.  Relevant past medical, surgical, family and social history reviewed and updated as indicated. Interim medical history since our last visit reviewed. Allergies and medications reviewed and updated.  Review of Systems  Constitutional: Negative.   Respiratory: Negative.   Cardiovascular: Negative.   Gastrointestinal: Negative.   Musculoskeletal: Positive for myalgias. Negative for arthralgias, back pain, gait problem, joint swelling, neck pain and neck stiffness.  Skin: Negative.   Neurological: Negative.   Psychiatric/Behavioral: Negative.     Per HPI unless specifically indicated above     Objective:    BP 107/71   Pulse 80   Temp 98.8 F (37.1 C)   Wt 163 lb (73.9 kg)   SpO2 100%   BMI 21.51 kg/m   Wt Readings from Last 3 Encounters:  02/06/21 163 lb (73.9 kg)  02/05/21 163 lb 6.4 oz (74.1 kg)  01/21/21 165 lb 12.8 oz (75.2 kg)    Physical Exam Vitals and nursing note reviewed.  Constitutional:      General: He is not in acute distress.    Appearance: Normal appearance. He is not ill-appearing, toxic-appearing or diaphoretic.  HENT:     Head: Normocephalic and atraumatic.     Right Ear: External ear  normal.     Left Ear: External ear normal.     Nose: Nose normal.     Mouth/Throat:     Mouth: Mucous membranes are moist.     Pharynx: Oropharynx is clear.  Eyes:     General: No scleral icterus.       Right eye: No discharge.        Left eye: No discharge.     Extraocular Movements: Extraocular movements intact.     Conjunctiva/sclera: Conjunctivae normal.     Pupils: Pupils are equal, round, and reactive to light.  Cardiovascular:     Rate and Rhythm: Normal rate and regular rhythm.     Pulses: Normal pulses.     Heart sounds: Normal heart sounds. No murmur heard. No friction rub. No gallop.   Pulmonary:     Effort: Pulmonary effort is normal. No respiratory distress.     Breath sounds: Normal breath sounds. No stridor. No wheezing, rhonchi or rales.  Chest:     Chest wall: No tenderness.  Musculoskeletal:        General: Tenderness (L deltoid, no redness, no heat, mild diffuse swelling,) present. Normal range of motion.     Cervical back: Normal range of motion and neck supple.  Skin:    General: Skin  is warm and dry.     Capillary Refill: Capillary refill takes less than 2 seconds.     Coloration: Skin is not jaundiced or pale.     Findings: No bruising, erythema, lesion or rash.  Neurological:     General: No focal deficit present.     Mental Status: He is alert and oriented to person, place, and time. Mental status is at baseline.  Psychiatric:        Mood and Affect: Mood normal.        Behavior: Behavior normal.        Thought Content: Thought content normal.        Judgment: Judgment normal.     Results for orders placed or performed in visit on 02/05/21  Comprehensive metabolic panel  Result Value Ref Range   Glucose 106 (H) 65 - 99 mg/dL   BUN 8 8 - 27 mg/dL   Creatinine, Ser 0.85 0.76 - 1.27 mg/dL   eGFR 96 >59 mL/min/1.73   BUN/Creatinine Ratio 9 (L) 10 - 24   Sodium 142 134 - 144 mmol/L   Potassium 4.2 3.5 - 5.2 mmol/L   Chloride 98 96 - 106 mmol/L    CO2 22 20 - 29 mmol/L   Calcium 9.6 8.6 - 10.2 mg/dL   Total Protein 6.8 6.0 - 8.5 g/dL   Albumin 4.6 3.8 - 4.8 g/dL   Globulin, Total 2.2 1.5 - 4.5 g/dL   Albumin/Globulin Ratio 2.1 1.2 - 2.2   Bilirubin Total 0.4 0.0 - 1.2 mg/dL   Alkaline Phosphatase 69 44 - 121 IU/L   AST 13 0 - 40 IU/L   ALT 10 0 - 44 IU/L  CBC with Differential/Platelet  Result Value Ref Range   WBC 6.1 3.4 - 10.8 x10E3/uL   RBC 4.13 (L) 4.14 - 5.80 x10E6/uL   Hemoglobin 11.7 (L) 13.0 - 17.7 g/dL   Hematocrit 35.1 (L) 37.5 - 51.0 %   MCV 85 79 - 97 fL   MCH 28.3 26.6 - 33.0 pg   MCHC 33.3 31.5 - 35.7 g/dL   RDW 13.1 11.6 - 15.4 %   Platelets 255 150 - 450 x10E3/uL   Neutrophils 53 Not Estab. %   Lymphs 34 Not Estab. %   Monocytes 9 Not Estab. %   Eos 3 Not Estab. %   Basos 1 Not Estab. %   Neutrophils Absolute 3.2 1.4 - 7.0 x10E3/uL   Lymphocytes Absolute 2.1 0.7 - 3.1 x10E3/uL   Monocytes Absolute 0.5 0.1 - 0.9 x10E3/uL   EOS (ABSOLUTE) 0.2 0.0 - 0.4 x10E3/uL   Basophils Absolute 0.1 0.0 - 0.2 x10E3/uL   Immature Granulocytes 0 Not Estab. %   Immature Grans (Abs) 0.0 0.0 - 0.1 x10E3/uL  Lipid Panel w/o Chol/HDL Ratio  Result Value Ref Range   Cholesterol, Total 148 100 - 199 mg/dL   Triglycerides 132 0 - 149 mg/dL   HDL 44 >39 mg/dL   VLDL Cholesterol Cal 23 5 - 40 mg/dL   LDL Chol Calc (NIH) 81 0 - 99 mg/dL  PSA  Result Value Ref Range   Prostate Specific Ag, Serum 0.4 0.0 - 4.0 ng/mL  TSH  Result Value Ref Range   TSH 0.505 0.450 - 4.500 uIU/mL  Urinalysis, Routine w reflex microscopic  Result Value Ref Range   Specific Gravity, UA 1.015 1.005 - 1.030   pH, UA 7.0 5.0 - 7.5   Color, UA Yellow Yellow   Appearance Ur Clear Clear  Leukocytes,UA Negative Negative   Protein,UA Negative Negative/Trace   Glucose, UA Negative Negative   Ketones, UA Negative Negative   RBC, UA Negative Negative   Bilirubin, UA Negative Negative   Urobilinogen, Ur 0.2 0.2 - 1.0 mg/dL   Nitrite, UA  Negative Negative  Microalbumin, Urine Waived (STAT)  Result Value Ref Range   Microalb, Ur Waived 30 (H) 0 - 19 mg/L   Creatinine, Urine Waived 200 10 - 300 mg/dL   Microalb/Creat Ratio <30 <30 mg/g      Assessment & Plan:   Problem List Items Addressed This Visit   None   Visit Diagnoses    Injection site reaction, initial encounter    -  Primary   No sign of redness or heat, but very tender and mildly swollen. Ice/Heat, tylenol/ibuprofen and continue to monitor. Doxy sent if worse tomorrow. Cont monitor.       Follow up plan: No follow-ups on file.

## 2021-02-06 NOTE — Telephone Encounter (Signed)
Pt.'s granddaughter, Luetta Nutting, reports pt. Had a pneumonia vaccine yesterday and now left arm has swelling.He is having nausea. No fever. She is going to his house to check on him. He is hard of hearing and has a hard time talking on the phone. Reviewed home care for local reactions to vaccines. Please advise.  Answer Assessment - Initial Assessment Questions 1. SYMPTOMS: "What is the main symptom?" (e.g., redness, swelling, pain)      Swollen - granddaughter, Amber 2. ONSET: "When was the vaccine (shot) given?" "How much later did the swelling begin?" (e.g., hours, days ago)      Pneumonia vaccine 3. SEVERITY: "How bad is it?"      Moderate 4. FEVER: "Is there a fever?" If Yes, ask: "What is it, how was it measured, and when did it start?"      No 5. IMMUNIZATIONS GIVEN: "What shots have you recently received?"     Pneumonia 6. PAST REACTIONS: "Have you reacted to immunizations before?" If Yes, ask: "What happened?"     No 7. OTHER SYMPTOMS: "Do you have any other symptoms?"     Nausea  Protocols used: IMMUNIZATION REACTIONS-A-AH

## 2021-02-08 ENCOUNTER — Encounter: Payer: Self-pay | Admitting: Family Medicine

## 2021-02-11 ENCOUNTER — Other Ambulatory Visit: Payer: Self-pay

## 2021-02-11 ENCOUNTER — Inpatient Hospital Stay: Payer: Medicare Other | Attending: Oncology | Admitting: Hospice and Palliative Medicine

## 2021-02-11 DIAGNOSIS — J392 Other diseases of pharynx: Secondary | ICD-10-CM

## 2021-02-11 NOTE — Progress Notes (Signed)
Multidisciplinary Oncology Council Documentation  Marcus Wright was presented by our [facility] on 02/11/2021, which included representatives from:  . Palliative Care . Dietitian  . Physical/Occupational Therapist . Nurse Navigator . Genetics . Speech Therapist . Social work . Survivorship RN . Hotel manager . Research RN . [name] currently presents with history of nasopharyngeal CA   We reviewed previous medical and familial history, history of present illness, and recent lab results along with all available histopathologic and imaging studies. The [place] considered available treatment options and made the following recommendations/referrals:  None. Monitor for needs   The MOC is a meeting of clinicians from various specialty areas who evaluate and discuss patients for whom a multidisciplinary approach is being considered. Final determinations in the plan of care are those of the provider(s).   Today's extended care, comprehensive team conference, Marcus Wright was not present for the discussion and was not examined.

## 2021-03-13 ENCOUNTER — Encounter: Payer: Self-pay | Admitting: Emergency Medicine

## 2021-03-13 ENCOUNTER — Emergency Department: Payer: Medicare Other

## 2021-03-13 ENCOUNTER — Emergency Department
Admission: EM | Admit: 2021-03-13 | Discharge: 2021-03-13 | Disposition: A | Discharge status: Home / Self Care | Payer: Medicare Other | Attending: Emergency Medicine | Admitting: Emergency Medicine

## 2021-03-13 ENCOUNTER — Other Ambulatory Visit: Payer: Self-pay

## 2021-03-13 DIAGNOSIS — Z96651 Presence of right artificial knee joint: Secondary | ICD-10-CM | POA: Diagnosis not present

## 2021-03-13 DIAGNOSIS — Z87891 Personal history of nicotine dependence: Secondary | ICD-10-CM | POA: Insufficient documentation

## 2021-03-13 DIAGNOSIS — Z85828 Personal history of other malignant neoplasm of skin: Secondary | ICD-10-CM | POA: Insufficient documentation

## 2021-03-13 DIAGNOSIS — Z79899 Other long term (current) drug therapy: Secondary | ICD-10-CM | POA: Insufficient documentation

## 2021-03-13 DIAGNOSIS — M47812 Spondylosis without myelopathy or radiculopathy, cervical region: Secondary | ICD-10-CM | POA: Diagnosis not present

## 2021-03-13 DIAGNOSIS — E039 Hypothyroidism, unspecified: Secondary | ICD-10-CM | POA: Insufficient documentation

## 2021-03-13 DIAGNOSIS — R0989 Other specified symptoms and signs involving the circulatory and respiratory systems: Secondary | ICD-10-CM | POA: Insufficient documentation

## 2021-03-13 DIAGNOSIS — R1310 Dysphagia, unspecified: Secondary | ICD-10-CM | POA: Diagnosis present

## 2021-03-13 DIAGNOSIS — R198 Other specified symptoms and signs involving the digestive system and abdomen: Secondary | ICD-10-CM

## 2021-03-13 DIAGNOSIS — Z9889 Other specified postprocedural states: Secondary | ICD-10-CM | POA: Diagnosis not present

## 2021-03-13 DIAGNOSIS — J392 Other diseases of pharynx: Secondary | ICD-10-CM | POA: Diagnosis not present

## 2021-03-13 DIAGNOSIS — I10 Essential (primary) hypertension: Secondary | ICD-10-CM | POA: Diagnosis not present

## 2021-03-13 MED ORDER — LIDOCAINE VISCOUS HCL 2 % MT SOLN
15.0000 mL | Freq: Once | OROMUCOSAL | Status: AC
  Administered 2021-03-13: 15 mL via OROMUCOSAL
  Filled 2021-03-13: qty 15

## 2021-03-13 MED ORDER — MAGIC MOUTHWASH W/LIDOCAINE: 5.0000 mL | Freq: Four times a day (QID) | ORAL | 2 refills | Status: DC

## 2021-03-13 NOTE — ED Triage Notes (Signed)
Pt is hard of hearing he does read lips. Family reports that he can usually talk but the pain is so bad he does not want to talk.

## 2021-03-13 NOTE — ED Provider Notes (Signed)
[facility] Emergency Department Provider Note  ____________________________________________  Time seen: Approximately 6:41 PM  I have reviewed the triage vital signs and the nursing notes.   HISTORY  Chief Complaint Dysphagia    HPI Marcus Wright is a 65 y.o. male who presents the emergency department complaining of dysphagia.  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  No evidence of malignancy.  Patient continues to be intermittently symptomatic and states that he was eating a chicken tender earlier this week, folic it got stuck.  Patient regurgitated the chicken nugget and had been doing okay since.  Today he was eating a peanut bar when he had a globus sensation.  He is able to swallow at this time.  Maintaining secretions.  Patient arrives with family member for evaluation of this globus sensation.  I have managed this patient in the past.  Similar symptoms and he always reports symptomatic improvement with Magic mouthwash cocktail.  Patient has none of this prescription remaining and has not tried any medications for the symptoms.  He is deaf, reads lips and states that he typically is able to speak but the sore throat after eating the peanut bar has limited his talking.  Patient is able to answer questions appropriately at this time.       Past Medical History:  Diagnosis Date   Cancer [facility]) 2003   skin'   Hyperlipidemia    Hypertension    Hypothyroidism    Thyroid disorder    Wears dentures    full upper   Wears hearing aid     Patient Active Problem List   Diagnosis Date Noted   Nasopharyngeal mass 09/02/2020   Dysphagia 09/01/2020   Neck mass 09/01/2020   Nasal abscess 08/31/2020   Cellulitis of right ear 03/11/2020   DJD (degenerative joint disease), thoracic 11/22/2018   Mastoiditis of right side 06/22/2017   Hearing loss 05/10/2017   Chronic  cluster headache 02/08/2017   Arthritis 02/08/2017   Bilateral hearing loss 01/24/2017   Ear mass, right 01/24/2017   Chronic otitis externa of right ear 09/23/2016   Mixed conductive and sensorineural hearing loss of both ears 09/23/2016   Chronic mucoid otitis media of both ears 08/26/2016   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 08/26/2016   Colonic constipation    Hyperlipidemia 05/21/2016   Other specified disorders of Eustachian tube, unspecified ear 05/20/2016   Encounter for fitting or adjustment of hearing aid 01/07/2016   Chronic mucoid otitis media 11/28/2015   History of sinus cancer    Hypertension    Thyroid disorder    Dysphagia, pharyngoesophageal phase 07/31/2015   Sensorineural hearing loss, bilateral 05/20/2015   Allergic rhinitis due to pollen 07/04/2014   Cervical radicular pain 02/07/2014   History of neck surgery 02/07/2014   Gynecomastia, male 06/20/2013    Past Surgical History:  Procedure Laterality Date   BACK SURGERY  2011   BREAST SURGERY Right 11-22-12   CHOLECYSTECTOMY     COLONOSCOPY     COLONOSCOPY WITH PROPOFOL N/A 07/19/2016   Procedure: COLONOSCOPY WITH PROPOFOL;  Surgeon: Lucilla Lame, MD;  Location: [place];  Service: Endoscopy;  Laterality: N/A;   REPLACEMENT TOTAL KNEE Right 1993   SINUS ENDO W/FUSION Right 09/03/2020   Procedure: Removal of Right nasopharyngeal mass, Biopsy and frozen sections;  Surgeon: Ebbie Latus A, DO;  Location: [place];  Service: ENT;  Laterality: Right;   SINUS SURGERY WITH INSTATRAK  2003   cancer    Prior to Admission medications   Medication Sig Start Date End Date Taking? Authorizing Provider  magic mouthwash w/lidocaine SOLN Take 5 mLs by mouth 4 (four) times daily. 03/13/21  Yes Akira Perusse, Charline Bills, PA-C  albuterol (VENTOLIN HFA) 108 (90 Base) MCG/ACT inhaler Inhale 2 puffs into the lungs every 4 (four) hours as needed for wheezing or shortness of breath. 12/26/20    Paulette Blanch, MD  amLODipine (NORVASC) 2.5 MG tablet Take 1 tablet (2.5 mg total) by mouth daily. 02/05/21   Johnson, Megan P, DO  doxycycline (VIBRA-TABS) 100 MG tablet Take 1 tablet (100 mg total) by mouth 2 (two) times daily. 02/06/21   Johnson, Megan P, DO  fluticasone (FLONASE) 50 MCG/ACT nasal spray Place 2 sprays into both nostrils daily. 02/05/21   Johnson, Megan P, DO  levothyroxine (SYNTHROID) 50 MCG tablet TAKE 1 TABLET BY MOUTH EVERY DAY Patient taking differently: Take 50 mcg by mouth daily. 02/20/20   Johnson, Megan P, DO  lidocaine (XYLOCAINE) 2 % solution Use as directed 10 mLs in the mouth or throat every 4 (four) hours as needed for mouth pain. Gargle and swallow Patient not taking: No sig reported 12/19/20   Syrenity Klepacki, Charline Bills, PA-C  magic mouthwash SOLN Take 5 mLs by mouth 3 (three) times daily as needed for mouth pain. Patient not taking: No sig reported 12/26/20   Paulette Blanch, MD  nystatin (MYCOSTATIN) 100000 UNIT/ML suspension Take 5 mLs (500,000 Units total) by mouth 4 (four) times daily. Patient not taking: No sig reported 12/26/20   Paulette Blanch, MD  ofloxacin (OCUFLOX) 0.3 % ophthalmic solution Place 3 drops in right ear 3 times daily x 7 days 11/18/20   Harvest Dark, MD  rosuvastatin (CRESTOR) 20 MG tablet TAKE 1 TABLET BY MOUTH EVERY DAY 01/24/21   Johnson, Megan P, DO  sucralfate (CARAFATE) 1 GM/10ML suspension Take 10 mLs (1 g total) by mouth 4 (four) times daily -  with meals and at bedtime. Patient not taking: No sig reported 12/26/20   Paulette Blanch, MD    Allergies Doxazosin  No family history on file.  Social History Social History   Tobacco Use   Smoking status: Former    Pack years: 0.00   Smokeless tobacco: Never   Tobacco comments:    quit 20+ yrs ago  Vaping Use   Vaping Use: Never used  Substance Use Topics   Alcohol use: Yes    Comment: on occasion, 1-2 beers or wine occ.    Drug use: No     Review of Systems  Constitutional: No  fever/chills Eyes: No visual changes. No discharge ENT: Sore throat/globus sensation Cardiovascular: no chest pain. Respiratory: no cough. No SOB. Gastrointestinal: No abdominal pain.  No nausea, no vomiting.  No diarrhea.  No constipation. Musculoskeletal: Negative for musculoskeletal pain. Skin: Negative for rash, abrasions, lacerations, ecchymosis. Neurological: Negative for headaches, focal weakness or numbness.  10 System ROS otherwise negative.  ____________________________________________   PHYSICAL EXAM:  VITAL SIGNS: ED Triage Vitals  Enc Vitals Group     BP 03/13/21 1528 117/73     Pulse Rate 03/13/21 1528 76     Resp 03/13/21 1528 20     Temp 03/13/21 1528 98.4 F (36.9 C)     Temp Source 03/13/21 1528 Oral     SpO2 03/13/21 1528 99 %  Weight 03/13/21 1531 162 lb 14.7 oz (73.9 kg)     Height 03/13/21 1531 6\' 1"  (1.854 m)     Head Circumference --      Peak Flow --      Pain Score 03/13/21 1530 7     Pain Loc --      Pain Edu? --      Excl. in [place]? --      Constitutional: Alert and oriented. Well appearing and in no acute distress. Eyes: Conjunctivae are normal. PERRL. EOMI. Head: Atraumatic. ENT:      Ears:       Nose: No congestion/rhinnorhea.      Mouth/Throat: Mucous membranes are moist.  No oropharyngeal foreign body identified.  Airway is patently open at this time.  Uvula is midline. Neck: No stridor.  No cervical spine tenderness to palpation.  No erythema or edema noted to the anterior neck. Hematological/Lymphatic/Immunilogical: No cervical lymphadenopathy. Cardiovascular: Normal rate, regular rhythm. Normal S1 and S2.  Good peripheral circulation. Respiratory: Normal respiratory effort without tachypnea or retractions. Lungs CTAB. Good air entry to the bases with no decreased or absent breath sounds. Musculoskeletal: Full range of motion to all extremities. No gross deformities appreciated. Neurologic:  Normal speech and language. No gross  focal neurologic deficits are appreciated.  Skin:  Skin is warm, dry and intact. No rash noted. Psychiatric: Mood and affect are normal. Speech and behavior are normal. Patient exhibits appropriate insight and judgement.   ____________________________________________   LABS (all labs ordered are listed, but only abnormal results are displayed)  Labs Reviewed - No data to display ____________________________________________  EKG   ____________________________________________  RADIOLOGY  DG Neck Soft Tissue  Result Date: 03/13/2021 CLINICAL DATA:  Recent choking incident EXAM: NECK SOFT TISSUES - 1+ VIEW COMPARISON:  12/19/2020 FINDINGS: Postsurgical changes are noted in the cervical spine at C4-5 and C5-6. This is stable from prior CT examination. Epiglottis and aryepiglottic folds are within normal limits. No foreign body is seen. Degenerative changes of the cervical spine are noted. IMPRESSION: No acute abnormality noted. Electronically Signed   By: Inez Catalina M.D.   On: 03/13/2021 19:24    ____________________________________________    PROCEDURES  Procedure(s) performed:    Procedures    Medications  lidocaine (XYLOCAINE) 2 % viscous mouth solution 15 mL (has no administration in time range)     ____________________________________________   INITIAL IMPRESSION / ASSESSMENT AND PLAN / ED COURSE  Pertinent labs & imaging results that were available during my care of the patient were reviewed by me and considered in my medical decision making (see chart for details).  Review of the [place] CSRS was performed in accordance of the [place] prior to dispensing any controlled drugs.           Patient's diagnosis is consistent with nasopharyngeal mass, dysphagia, globus sensation.  Patient presented to the emergency department complaining of globus sensation after eating a peanut bar today and feeling like it became stuck in his throat.  He is maintaining secretions at  this time.  I have evaluated the patient in the past for the similar complaint as well as reviewing his records of evaluations by oncology and ENT.  Patient had this globus sensation after eating a peanut bar.  There was no evidence of trauma to the oropharynx and no visualized foreign body.  Soft tissue neck reveals no radiopaque foreign body.  He is able to tolerate secretions and viscous lidocaine at this time.  He  is reported good symptomatic control in the past with viscous lidocaine and a Magic mouthwash cocktail.  I will prescribe the same for the patient at this time.  No further work-up deemed necessary.  Return precautions discussed with the patient and family member.  Follow-up with primary care or ENT as needed.. Patient is given ED precautions to return to the ED for any worsening or new symptoms.     ____________________________________________  FINAL CLINICAL IMPRESSION(S) / ED DIAGNOSES  Final diagnoses:  Nasopharyngeal mass  Globus sensation      NEW MEDICATIONS STARTED DURING THIS VISIT:  ED Discharge Orders          Ordered    magic mouthwash w/lidocaine SOLN  4 times daily       Note to Pharmacy: Dispense in a 1/1/1 ratio. Use lidocaine, diphenhydramine, prednisolone   03/13/21 1957                This chart was dictated using voice recognition software/Dragon. Despite best efforts to proofread, errors can occur which can change the meaning. Any change was purely unintentional.    Darletta Moll, PA-C 03/13/21 1958    Carrie Mew, MD 03/14/21 [phone]

## 2021-03-13 NOTE — ED Triage Notes (Signed)
Pts family states that pt got choked up on some food and states that it hurts so bad it hurts to swallow. He had this same thing happen on Wednesday when he got choked on a chicken nugget. Family reports that it may be strep. Pt is able to breath without difficulty. He is swallowing his saliva and has a cup of water with him. He is not talking and his family is doing it for him because of the pain per family member.

## 2021-03-20 ENCOUNTER — Other Ambulatory Visit: Payer: Self-pay | Admitting: Family Medicine

## 2021-04-09 DIAGNOSIS — H6123 Impacted cerumen, bilateral: Secondary | ICD-10-CM | POA: Diagnosis not present

## 2021-04-09 DIAGNOSIS — H6063 Unspecified chronic otitis externa, bilateral: Secondary | ICD-10-CM | POA: Diagnosis not present

## 2021-04-17 ENCOUNTER — Ambulatory Visit
Admission: RE | Admit: 2021-04-17 | Discharge: 2021-04-17 | Disposition: A | Discharge status: Home / Self Care | Payer: Medicare Other | Source: Ambulatory Visit | Attending: Radiation Oncology | Admitting: Radiation Oncology

## 2021-04-17 ENCOUNTER — Other Ambulatory Visit: Payer: Self-pay

## 2021-04-17 DIAGNOSIS — J392 Other diseases of pharynx: Secondary | ICD-10-CM | POA: Diagnosis not present

## 2021-04-17 DIAGNOSIS — C313 Malignant neoplasm of sphenoid sinus: Secondary | ICD-10-CM

## 2021-04-17 DIAGNOSIS — K110 Atrophy of salivary gland: Secondary | ICD-10-CM | POA: Diagnosis not present

## 2021-04-17 DIAGNOSIS — I6521 Occlusion and stenosis of right carotid artery: Secondary | ICD-10-CM | POA: Diagnosis not present

## 2021-04-17 DIAGNOSIS — R599 Enlarged lymph nodes, unspecified: Secondary | ICD-10-CM | POA: Diagnosis not present

## 2021-04-17 DIAGNOSIS — C119 Malignant neoplasm of nasopharynx, unspecified: Secondary | ICD-10-CM | POA: Diagnosis not present

## 2021-04-17 LAB — POCT I-STAT CREATININE: Creatinine, Ser: 0.9 mg/dL (ref 0.61–1.24)

## 2021-04-17 MED ORDER — IOHEXOL 350 MG/ML SOLN
75.0000 mL | Freq: Once | INTRAVENOUS | Status: AC | PRN
  Administered 2021-04-17: 75 mL via INTRAVENOUS

## 2021-04-20 ENCOUNTER — Ambulatory Visit: Payer: Medicare Other | Admitting: Radiation Oncology

## 2021-04-20 NOTE — Progress Notes (Signed)
[place]  [phone] Fax:([phone]) [phone]  ID: Marcus Wright OB: 29-Nov-1955  MR#: [id number]  ID:[id number]  Patient Care Team: Valerie Roys, DO as PCP - General (Family Medicine) Clyde Canterbury, MD as Referring Physician (Otolaryngology)  CHIEF COMPLAINT: Right nasopharyngeal mass.  INTERVAL HISTORY: Patient returns to clinic today for further evaluation and discussion of his imaging results.  Much of the history is given by his granddaughter.  He continues to feel well and remains asymptomatic.  He denies any pain or dysphagia.  He has a good appetite and denies weight loss.  He has no neurologic complaints.  He denies any recent fevers or illnesses.  He has no chest pain, shortness of breath, cough, or hemoptysis.  Patient denies any nausea, vomiting, constipation, or diarrhea.  He has no urinary complaints.  Patient feels at his baseline and offers no specific complaints today.    REVIEW OF SYSTEMS:   Review of Systems  Constitutional: Negative.  Negative for fever, malaise/fatigue and weight loss.  Respiratory: Negative.  Negative for cough, hemoptysis and shortness of breath.   Cardiovascular: Negative.  Negative for chest pain and leg swelling.  Gastrointestinal: Negative.  Negative for abdominal pain.  Genitourinary: Negative.  Negative for dysuria.  Musculoskeletal: Negative.  Negative for neck pain.  Skin: Negative.  Negative for rash.  Neurological: Negative.  Negative for dizziness, focal weakness, weakness and headaches.  Psychiatric/Behavioral: Negative.  The patient is not nervous/anxious.    As per HPI. Otherwise, a complete review of systems is negative.  PAST MEDICAL HISTORY: Past Medical History:  Diagnosis Date   Cancer [facility]) 2003   skin'   Hyperlipidemia    Hypertension    Hypothyroidism    Thyroid disorder    Wears dentures    full upper   Wears hearing aid     PAST SURGICAL HISTORY: Past Surgical History:   Procedure Laterality Date   BACK SURGERY  2011   BREAST SURGERY Right 11-22-12   CHOLECYSTECTOMY     COLONOSCOPY     COLONOSCOPY WITH PROPOFOL N/A 07/19/2016   Procedure: COLONOSCOPY WITH PROPOFOL;  Surgeon: Lucilla Lame, MD;  Location: [place];  Service: Endoscopy;  Laterality: N/A;   REPLACEMENT TOTAL KNEE Right 1993   SINUS ENDO W/FUSION Right 09/03/2020   Procedure: Removal of Right nasopharyngeal mass, Biopsy and frozen sections;  Surgeon: Jason Coop, DO;  Location: [place];  Service: ENT;  Laterality: Right;   SINUS SURGERY WITH INSTATRAK  2003   cancer    FAMILY HISTORY: No family history on file.  ADVANCED DIRECTIVES (Y/N):  N  HEALTH MAINTENANCE: Social History   Tobacco Use   Smoking status: Former   Smokeless tobacco: Never   Tobacco comments:    quit 20+ yrs ago  Vaping Use   Vaping Use: Never used  Substance Use Topics   Alcohol use: Yes    Comment: on occasion, 1-2 beers or wine occ.    Drug use: No     Colonoscopy:  PAP:  Bone density:  Lipid panel:  Allergies  Allergen Reactions   Doxazosin Hives    Unknown     Current Outpatient Medications  Medication Sig Dispense Refill   albuterol (VENTOLIN HFA) 108 (90 Base) MCG/ACT inhaler Inhale 2 puffs into the lungs every 4 (four) hours as needed for wheezing or shortness of breath. 1 each 0   amLODipine (NORVASC) 2.5 MG tablet Take 1 tablet (2.5 mg total) by mouth daily. Charlo  tablet 1   doxycycline (VIBRA-TABS) 100 MG tablet Take 1 tablet (100 mg total) by mouth 2 (two) times daily. 14 tablet 0   fluticasone (FLONASE) 50 MCG/ACT nasal spray Place 2 sprays into both nostrils daily. 16 g 6   levothyroxine (SYNTHROID) 50 MCG tablet TAKE 1 TABLET BY MOUTH EVERY DAY 90 tablet 3   magic mouthwash w/lidocaine SOLN Take 5 mLs by mouth 4 (four) times daily. 240 mL 2   ofloxacin (OCUFLOX) 0.3 % ophthalmic solution Place 3 drops in right ear 3 times daily x 7 days 10 mL 0   rosuvastatin (CRESTOR)  20 MG tablet TAKE 1 TABLET BY MOUTH EVERY DAY 90 tablet 1   lidocaine (XYLOCAINE) 2 % solution Use as directed 10 mLs in the mouth or throat every 4 (four) hours as needed for mouth pain. Gargle and swallow (Patient not taking: No sig reported) 200 mL 0   magic mouthwash SOLN Take 5 mLs by mouth 3 (three) times daily as needed for mouth pain. (Patient not taking: No sig reported) 75 mL 0   nystatin (MYCOSTATIN) 100000 UNIT/ML suspension Take 5 mLs (500,000 Units total) by mouth 4 (four) times daily. (Patient not taking: No sig reported) 60 mL 0   sucralfate (CARAFATE) 1 GM/10ML suspension Take 10 mLs (1 g total) by mouth 4 (four) times daily -  with meals and at bedtime. (Patient not taking: No sig reported) 420 mL 0   No current facility-administered medications for this visit.    OBJECTIVE: Vitals:   04/22/21 1129  BP: 126/83  Pulse: 69  Temp: 98.7 F (37.1 C)  SpO2: 99%     Body mass index is 21.31 kg/m.    ECOG FS:1 - Symptomatic but completely ambulatory  General: Well-developed, well-nourished, no acute distress. Eyes: Pink conjunctiva, anicteric sclera. HEENT: Normocephalic, moist mucous membranes.  No palpable lymphadenopathy. Lungs: No audible wheezing or coughing. Heart: Regular rate and rhythm. Abdomen: Soft, nontender, no obvious distention. Musculoskeletal: No edema, cyanosis, or clubbing. Neuro: Alert, answering all questions appropriately. Cranial nerves grossly intact. Skin: No rashes or petechiae noted. Psych: Normal affect.   LAB RESULTS:  Lab Results  Component Value Date   NA 142 02/05/2021   K 4.2 02/05/2021   CL 98 02/05/2021   CO2 22 02/05/2021   GLUCOSE 106 (H) 02/05/2021   BUN 8 02/05/2021   CREATININE 0.90 04/17/2021   CALCIUM 9.6 02/05/2021   PROT 6.8 02/05/2021   ALBUMIN 4.6 02/05/2021   AST 13 02/05/2021   ALT 10 02/05/2021   ALKPHOS 69 02/05/2021   BILITOT 0.4 02/05/2021   GFRNONAA >60 12/19/2020   GFRAA 99 06/03/2020    Lab Results   Component Value Date   WBC 6.1 02/05/2021   NEUTROABS 3.2 02/05/2021   HGB 11.7 (L) 02/05/2021   HCT 35.1 (L) 02/05/2021   MCV 85 02/05/2021   PLT 255 02/05/2021     STUDIES: CT Soft Tissue Neck W Contrast  Result Date: 04/17/2021 CLINICAL DATA:  Restaging of right nasopharyngeal carcinoma EXAM: CT NECK WITH CONTRAST TECHNIQUE: Multidetector CT imaging of the neck was performed using the standard protocol following the bolus administration of intravenous contrast. CONTRAST:  63m OMNIPAQUE IOHEXOL 350 MG/ML SOLN COMPARISON:  12/19/2020 FINDINGS: PHARYNX AND LARYNX: Unchanged asymmetric fullness in the right nasopharynx with soft tissue extending into the right parapharyngeal and carotid spaces and encasing the right internal carotid artery. The hypopharynx, oropharynx and larynx are unremarkable. Normal epiglottis. SALIVARY GLANDS: Mildly atrophic salivary glands.  THYROID: Normal. LYMPH NODES: Unchanged level 1B lymph nodes, measuring 12 mm on the right and 8 mm on the left. No other enlarged or abnormal density nodes. VASCULAR: Severe narrowing of the proximal right common carotid artery due to atherosclerotic plaque. This is unchanged. LIMITED INTRACRANIAL: Normal. VISUALIZED ORBITS: Normal. MASTOIDS AND VISUALIZED PARANASAL SINUSES: Unchanged opacification of the right mastoid and middle ear. Mild sphenoid sinus mucosal thickening. SKELETON: No bony spinal canal stenosis. No lytic or blastic lesions. UPPER CHEST: Clear. OTHER: None. IMPRESSION: 1. Unchanged asymmetric fullness in the right nasopharynx with soft tissue extending into the right parapharyngeal and carotid spaces and encasing the right internal carotid artery. 2. Unchanged level 1B lymph nodes. 3. Unchanged opacification of the right mastoid and middle ear. Electronically Signed   By: Ulyses Jarred M.D.   On: 04/17/2021 20:37     ASSESSMENT: Right nasopharyngeal mass.  PLAN:    1. Right nasopharyngeal mass: Patient underwent  chemotherapy and XRT nearly 20 years ago for squamous cell carcinoma in the same anatomic area.  Previously, he was evaluated by ENT and underwent endoscopy which did not reveal anything they could biopsy.  PET scan results from January 09, 2021 reviewed independently with hypermetabolic mass highly suspicious for recurrence.  Since no biopsy could be performed, it was decided upon to proceed with active surveillance. CT scan on April 17, 2021 reviewed independently and reported as above with no change in asymmetric fullness of the right nasopharynx.  Appreciate radiation oncology input.  Will continue active surveillance and patient will return to clinic in 6 months with repeat imaging and further evaluation.    I spent a total of 20 minutes reviewing chart data, face-to-face evaluation with the patient, counseling and coordination of care as detailed above.  Patient expressed understanding and was in agreement with this plan. He also understands that He can call clinic at any time with any questions, concerns, or complaints.    Lloyd Huger, MD   04/23/2021 7:27 AM

## 2021-04-22 ENCOUNTER — Ambulatory Visit
Admission: RE | Admit: 2021-04-22 | Discharge: 2021-04-22 | Disposition: A | Discharge status: Home / Self Care | Payer: Medicare Other | Source: Ambulatory Visit | Attending: Radiation Oncology | Admitting: Radiation Oncology

## 2021-04-22 ENCOUNTER — Inpatient Hospital Stay: Payer: Medicare Other | Attending: Oncology | Admitting: Oncology

## 2021-04-22 VITALS — BP 110/82 | HR 101 | Resp 18 | Wt 161.0 lb

## 2021-04-22 VITALS — BP 126/83 | HR 69 | Temp 98.7°F | Wt 161.5 lb

## 2021-04-22 DIAGNOSIS — Z85828 Personal history of other malignant neoplasm of skin: Secondary | ICD-10-CM | POA: Insufficient documentation

## 2021-04-22 DIAGNOSIS — R220 Localized swelling, mass and lump, head: Secondary | ICD-10-CM | POA: Insufficient documentation

## 2021-04-22 DIAGNOSIS — I10 Essential (primary) hypertension: Secondary | ICD-10-CM | POA: Insufficient documentation

## 2021-04-22 DIAGNOSIS — Z87891 Personal history of nicotine dependence: Secondary | ICD-10-CM | POA: Diagnosis not present

## 2021-04-22 DIAGNOSIS — E785 Hyperlipidemia, unspecified: Secondary | ICD-10-CM | POA: Diagnosis not present

## 2021-04-22 DIAGNOSIS — J392 Other diseases of pharynx: Secondary | ICD-10-CM

## 2021-04-22 DIAGNOSIS — Z923 Personal history of irradiation: Secondary | ICD-10-CM | POA: Diagnosis not present

## 2021-04-22 DIAGNOSIS — E039 Hypothyroidism, unspecified: Secondary | ICD-10-CM | POA: Insufficient documentation

## 2021-04-22 DIAGNOSIS — Z79899 Other long term (current) drug therapy: Secondary | ICD-10-CM | POA: Insufficient documentation

## 2021-04-22 DIAGNOSIS — C119 Malignant neoplasm of nasopharynx, unspecified: Secondary | ICD-10-CM

## 2021-04-22 NOTE — Progress Notes (Signed)
Radiation Oncology Follow up Note  Name: Marcus Wright   Date:   04/22/2021 MRN:  [id number] DOB: 1956/03/20    This 65 y.o. male presents to the clinic today for follow-up of suspected nasopharyngeal recurrence in patient treated 20 years prior for nasopharyngeal cancer.  REFERRING PROVIDER: Valerie Roys, DO  HPI: Patient is a 65 year old male treated 20 years prior for locally advanced nasopharyngeal carcinoma.  He has been followed by [facility].  He has had biopsies of an area of hypermetabolic activity in the nasopharynx with negative biopsies.  I have repeated his CT scan.  Showing unchanged asymmetric fullness in the right nasopharynx with Tausch soft tissue extending into the right parapharyngeal and carotid spaces.  Also unchanged level 1B lymph nodes.  We discussed his case at tumor board and biopsy of this area again because it is in close proximity to the carotid space would be difficult.  He is asymptomatic except for his profound hearing loss.  Specifically denies head and neck pain or dysphagia.  COMPLICATIONS OF TREATMENT: present  FOLLOW UP COMPLIANCE: keeps appointments   PHYSICAL EXAM:  BP 110/82 (Patient Position: Standing)   Pulse (!) 101   Resp 18   Wt 161 lb (73 kg)   SpO2 99%   BMI 21.24 kg/m no evidence of adenopathy in the cervical or supraclavicular region. Well-developed well-nourished patient in NAD. HEENT reveals PERLA, EOMI, discs not visualized.  Oral cavity is clear. No oral mucosal lesions are identified. Neck is clear without evidence of cervical or supraclavicular adenopathy. Lungs are clear to A&P. Cardiac examination is essentially unremarkable with regular rate and rhythm without murmur rub or thrill. Abdomen is benign with no organomegaly or masses noted. Motor sensory and DTR levels are equal and symmetric in the upper and lower extremities. Cranial nerves II through XII are grossly intact. Proprioception is intact. No peripheral  adenopathy or edema is identified. No motor or sensory levels are noted. Crude visual fields are within normal range.  RADIOLOGY RESULTS: Serial CT scans reviewed as well as PET CT scan  PLAN: At this time over the past year and a half there is not been any change in the size or expansion of this soft tissue abnormality in his nasopharynx.  I would consider just continuing to follow patient with a 98-monthfollow-up and repeat CT scan at that time.  He is asymptomatic.  Highly unlikely nasopharyngeal cancer recurrence would stay stable over this amount of time.  Other options including interventional radiology attempted biopsy may be entertained in the future.  Patient and granddaughter both comprehend my recommendations well.  I would like to take this opportunity to thank you for allowing me to participate in the care of your patient..Noreene Filbert MD

## 2021-05-07 ENCOUNTER — Other Ambulatory Visit: Payer: Self-pay

## 2021-05-07 ENCOUNTER — Ambulatory Visit (INDEPENDENT_AMBULATORY_CARE_PROVIDER_SITE_OTHER): Payer: Medicare Other | Admitting: Family Medicine

## 2021-05-07 ENCOUNTER — Telehealth: Payer: Self-pay

## 2021-05-07 ENCOUNTER — Encounter: Payer: Self-pay | Admitting: Family Medicine

## 2021-05-07 VITALS — BP 98/60 | HR 72 | Temp 98.0°F | Wt 161.8 lb

## 2021-05-07 DIAGNOSIS — C44622 Squamous cell carcinoma of skin of right upper limb, including shoulder: Secondary | ICD-10-CM

## 2021-05-07 NOTE — Telephone Encounter (Signed)
Patient's granddaughter notified

## 2021-05-07 NOTE — Progress Notes (Signed)
BP 98/60   Pulse 72   Temp 98 F (36.7 C) (Oral)   Wt 161 lb 12.8 oz (73.4 kg)   SpO2 98%   BMI 21.35 kg/m    Subjective:    Patient ID: [name], male    DOB: 1956/05/03, 65 y.o.   MRN: [id number]  HPI: Marcus Wright is a 65 y.o. male  Chief Complaint  Patient presents with   Sore    Pt states he has a sore on his R arm    SKIN LESION Duration: few days Location: R lateral elbow Painful: yes Itching: no Onset: gradual Context: bigger Associated signs and symptoms: oozing History of skin cancer: yes  Relevant past medical, surgical, family and social history reviewed and updated as indicated. Interim medical history since our last visit reviewed. Allergies and medications reviewed and updated.  Review of Systems  Constitutional: Negative.   Respiratory: Negative.    Cardiovascular: Negative.   Gastrointestinal: Negative.   Skin:  Positive for wound. Negative for color change, pallor and rash.  Psychiatric/Behavioral: Negative.     Per HPI unless specifically indicated above     Objective:    BP 98/60   Pulse 72   Temp 98 F (36.7 C) (Oral)   Wt 161 lb 12.8 oz (73.4 kg)   SpO2 98%   BMI 21.35 kg/m   Wt Readings from Last 3 Encounters:  05/07/21 161 lb 12.8 oz (73.4 kg)  04/22/21 161 lb (73 kg)  04/22/21 161 lb 8 oz (73.3 kg)    Physical Exam Vitals and nursing note reviewed.  Constitutional:      General: He is not in acute distress.    Appearance: Normal appearance. He is not ill-appearing, toxic-appearing or diaphoretic.  HENT:     Head: Normocephalic and atraumatic.     Right Ear: External ear normal.     Left Ear: External ear normal.     Nose: Nose normal.     Mouth/Throat:     Mouth: Mucous membranes are moist.     Pharynx: Oropharynx is clear.  Eyes:     General: No scleral icterus.       Right eye: No discharge.        Left eye: No discharge.     Extraocular Movements: Extraocular movements intact.      Conjunctiva/sclera: Conjunctivae normal.     Pupils: Pupils are equal, round, and reactive to light.  Cardiovascular:     Rate and Rhythm: Normal rate and regular rhythm.     Pulses: Normal pulses.     Heart sounds: Normal heart sounds. No murmur heard.   No friction rub. No gallop.  Pulmonary:     Effort: Pulmonary effort is normal. No respiratory distress.     Breath sounds: Normal breath sounds. No stridor. No wheezing, rhonchi or rales.  Chest:     Chest wall: No tenderness.  Musculoskeletal:        General: Normal range of motion.     Cervical back: Normal range of motion and neck supple.  Skin:    General: Skin is warm and dry.     Capillary Refill: Capillary refill takes less than 2 seconds.     Coloration: Skin is not jaundiced or pale.     Findings: No bruising, erythema, lesion or rash.     Comments: Non healing wound about 2cm x 1cm on R lateral elbow in the same place as previous squamous cell  Neurological:  General: No focal deficit present.     Mental Status: He is alert and oriented to person, place, and time. Mental status is at baseline.  Psychiatric:        Mood and Affect: Mood normal.        Behavior: Behavior normal.        Thought Content: Thought content normal.        Judgment: Judgment normal.    Results for orders placed or performed during the hospital encounter of 04/17/21  I-STAT creatinine  Result Value Ref Range   Creatinine, Ser 0.90 0.61 - 1.24 mg/dL      Assessment & Plan:   Problem List Items Addressed This Visit   None Visit Diagnoses     Squamous cell carcinoma of skin of right upper extremity, including shoulder    -  Primary   Will get him back into see dermatology. Appointment scheduled for 05/12/21 at 1:10. Call with any concerns.         Follow up plan: Return if symptoms worsen or fail to improve.

## 2021-05-07 NOTE — Telephone Encounter (Signed)
Please let her know that he needs to get back into see the dermatologist- and that he has an appointment scheduled- it is on his AVS

## 2021-05-07 NOTE — Telephone Encounter (Signed)
Copied from [id number]. Topic: General - Other >> May 07, 2021 10:32 AM Leward Quan A wrote: Reason for CRM: Patient granddaughter Amber called in asking to speak to Dr Wynetta Emery would like a highlight of his visit today. Asking for a call back at Ph#  [phone]

## 2021-05-21 DIAGNOSIS — C44622 Squamous cell carcinoma of skin of right upper limb, including shoulder: Secondary | ICD-10-CM | POA: Diagnosis not present

## 2021-05-21 DIAGNOSIS — L298 Other pruritus: Secondary | ICD-10-CM | POA: Diagnosis not present

## 2021-05-21 DIAGNOSIS — L928 Other granulomatous disorders of the skin and subcutaneous tissue: Secondary | ICD-10-CM | POA: Diagnosis not present

## 2021-07-08 ENCOUNTER — Other Ambulatory Visit: Payer: Self-pay | Admitting: Family Medicine

## 2021-07-08 NOTE — Telephone Encounter (Signed)
Requested Prescriptions  Pending Prescriptions Disp Refills  . rosuvastatin (CRESTOR) 20 MG tablet [Pharmacy Med Name: ROSUVASTATIN CALCIUM 20 MG TAB] 30 tablet 0    Sig: TAKE 1 TABLET BY MOUTH EVERY DAY     Cardiovascular:  Antilipid - Statins Passed - 07/08/2021  1:30 PM      Passed - Total Cholesterol in normal range and within 360 days    Cholesterol, Total  Date Value Ref Range Status  02/05/2021 148 100 - 199 mg/dL Final   Cholesterol Piccolo, Waived  Date Value Ref Range Status  05/21/2016 251 (H) <200 mg/dL Final    Comment:                            Desirable                <200                         Borderline High      200- 239                         High                     >239          Passed - LDL in normal range and within 360 days    LDL Chol Calc (NIH)  Date Value Ref Range Status  02/05/2021 81 0 - 99 mg/dL Final         Passed - HDL in normal range and within 360 days    HDL  Date Value Ref Range Status  02/05/2021 44 >39 mg/dL Final         Passed - Triglycerides in normal range and within 360 days    Triglycerides  Date Value Ref Range Status  02/05/2021 132 0 - 149 mg/dL Final   Triglycerides Piccolo,Waived  Date Value Ref Range Status  05/21/2016 292 (H) <150 mg/dL Final    Comment:                            Normal                   <150                         Borderline High     150 - 199                         High                200 - 499                         Very High                >499          Passed - Patient is not pregnant      Passed - Valid encounter within last 12 months    Recent Outpatient Visits          2 months ago Squamous cell carcinoma of skin of right upper extremity, including shoulder   [facility] [place], Megan P, DO   5 months ago Injection site  reaction, initial encounter   [place], Megan P, DO   5 months ago Primary hypertension   [place], Megan P, DO   5 months ago Squamous cell carcinoma of skin of right upper extremity, including shoulder   [place] P, DO   6 months ago Non-healing wound of right upper extremity   [facility] Valerie Roys, DO      Future Appointments            In 4 weeks Wynetta Emery, Barb Merino, DO [facility]

## 2021-08-05 ENCOUNTER — Ambulatory Visit (INDEPENDENT_AMBULATORY_CARE_PROVIDER_SITE_OTHER): Payer: Medicare Other | Admitting: Family Medicine

## 2021-08-05 ENCOUNTER — Encounter: Payer: Self-pay | Admitting: Family Medicine

## 2021-08-05 ENCOUNTER — Other Ambulatory Visit: Payer: Self-pay

## 2021-08-05 VITALS — BP 122/73 | HR 79 | Temp 98.2°F | Wt 162.6 lb

## 2021-08-05 DIAGNOSIS — E079 Disorder of thyroid, unspecified: Secondary | ICD-10-CM

## 2021-08-05 DIAGNOSIS — I10 Essential (primary) hypertension: Secondary | ICD-10-CM | POA: Diagnosis not present

## 2021-08-05 DIAGNOSIS — E782 Mixed hyperlipidemia: Secondary | ICD-10-CM

## 2021-08-05 DIAGNOSIS — H6011 Cellulitis of right external ear: Secondary | ICD-10-CM | POA: Diagnosis not present

## 2021-08-05 MED ORDER — OMEPRAZOLE 20 MG PO CPDR: 20.0000 mg | DELAYED_RELEASE_CAPSULE | Freq: Every day | ORAL | 3 refills | Status: DC

## 2021-08-05 MED ORDER — ALBUTEROL SULFATE HFA 108 (90 BASE) MCG/ACT IN AERS: 2.0000 | INHALATION_SPRAY | RESPIRATORY_TRACT | 0 refills | Status: DC | PRN

## 2021-08-05 MED ORDER — AMLODIPINE BESYLATE 2.5 MG PO TABS: 2.5000 mg | ORAL_TABLET | Freq: Every day | ORAL | 1 refills | Status: DC

## 2021-08-05 MED ORDER — SULFAMETHOXAZOLE-TRIMETHOPRIM 800-160 MG PO TABS: 1.0000 | ORAL_TABLET | Freq: Two times a day (BID) | ORAL | 0 refills | Status: DC

## 2021-08-05 MED ORDER — ROSUVASTATIN CALCIUM 20 MG PO TABS: 20.0000 mg | ORAL_TABLET | Freq: Every day | ORAL | 1 refills | Status: DC

## 2021-08-05 NOTE — Assessment & Plan Note (Signed)
Under good control on current regimen. Continue current regimen. Continue to monitor. Call with any concerns. Refills given. Labs drawn today.   

## 2021-08-05 NOTE — Assessment & Plan Note (Signed)
Cellulitis of EAC w significant swelling. Will treat with oral bactrim and recheck 2 weeks to confirm resolution- if not better, ?mass, will get him back to ENT

## 2021-08-05 NOTE — Progress Notes (Signed)
BP 122/73   Pulse 79   Temp 98.2 F (36.8 C)   Wt 162 lb 9.6 oz (73.8 kg)   SpO2 98%   BMI 21.45 kg/m    Subjective:    Patient ID: Marcus Wright, male    DOB: 1955/12/11, 65 y.o.   MRN: [id number]  HPI: Marcus Wright is a 65 y.o. male  Chief Complaint  Patient presents with   Hypertension   Hyperlipidemia   thyroid disorder   HYPERTENSION / [place] Satisfied with current treatment? yes Duration of hypertension: chronic BP monitoring frequency: not checking BP medication side effects: no Past BP meds: amlodipine,  Duration of hyperlipidemia: chronic Cholesterol medication side effects: no Cholesterol supplements: none Past cholesterol medications: crestor Medication compliance: excellent compliance Aspirin: no Recent stressors: no Recurrent headaches: no Visual changes: no Palpitations: no Dyspnea: no Chest pain: no Lower extremity edema: no Dizzy/lightheaded: no  HYPOTHYROIDISM Thyroid control status:controlled Satisfied with current treatment? yes Medication side effects: no Medication compliance: excellent compliance Recent dose adjustment:no Fatigue: no Cold intolerance: no Heat intolerance: no Weight gain: no Weight loss: no Constipation: no Diarrhea/loose stools: no Palpitations: no Lower extremity edema: no Anxiety/depressed mood: no  EAR PAIN Duration: few days Involved ear(s): right Severity:  moderate  Quality:  swollen- can't get drops in Fever: no Otorrhea: yes Upper respiratory infection symptoms: no Pruritus: no Hearing loss: yes Water immersion no Using Q-tips: no Recurrent otitis media: no Status: worse Treatments attempted: none   Relevant past medical, surgical, family and social history reviewed and updated as indicated. Interim medical history since our last visit reviewed. Allergies and medications reviewed and updated.  Review of Systems  Constitutional: Negative.   HENT:  Positive for ear  discharge, ear pain and hearing loss. Negative for congestion, dental problem, drooling, facial swelling, mouth sores, nosebleeds, postnasal drip, rhinorrhea, sinus pressure, sinus pain, sneezing, sore throat, tinnitus, trouble swallowing and voice change.   Respiratory: Negative.    Cardiovascular: Negative.   Gastrointestinal: Negative.   Musculoskeletal: Negative.   Psychiatric/Behavioral: Negative.     Per HPI unless specifically indicated above     Objective:    BP 122/73   Pulse 79   Temp 98.2 F (36.8 C)   Wt 162 lb 9.6 oz (73.8 kg)   SpO2 98%   BMI 21.45 kg/m   Wt Readings from Last 3 Encounters:  08/05/21 162 lb 9.6 oz (73.8 kg)  05/07/21 161 lb 12.8 oz (73.4 kg)  04/22/21 161 lb (73 kg)    Physical Exam Vitals and nursing note reviewed.  Constitutional:      General: He is not in acute distress.    Appearance: Normal appearance. He is not ill-appearing, toxic-appearing or diaphoretic.  HENT:     Head: Normocephalic and atraumatic.     Right Ear: External ear normal. Swelling and tenderness present. There is no impacted cerumen. No foreign body. No mastoid tenderness. No PE tube. No hemotympanum. Tympanic membrane is scarred. Tympanic membrane is not injected, perforated, erythematous, retracted or bulging. Tympanic membrane has normal mobility.     Left Ear: External ear normal.     Nose: Nose normal.     Mouth/Throat:     Mouth: Mucous membranes are moist.     Pharynx: Oropharynx is clear.  Eyes:     General: No scleral icterus.       Right eye: No discharge.        Left eye: No discharge.     Extraocular  Movements: Extraocular movements intact.     Conjunctiva/sclera: Conjunctivae normal.     Pupils: Pupils are equal, round, and reactive to light.  Cardiovascular:     Rate and Rhythm: Normal rate and regular rhythm.     Pulses: Normal pulses.     Heart sounds: Normal heart sounds. No murmur heard.   No friction rub. No gallop.  Pulmonary:     Effort:  Pulmonary effort is normal. No respiratory distress.     Breath sounds: Normal breath sounds. No stridor. No wheezing, rhonchi or rales.  Chest:     Chest wall: No tenderness.  Musculoskeletal:        General: Normal range of motion.     Cervical back: Normal range of motion and neck supple.  Skin:    General: Skin is warm and dry.     Capillary Refill: Capillary refill takes less than 2 seconds.     Coloration: Skin is not jaundiced or pale.     Findings: No bruising, erythema, lesion or rash.  Neurological:     General: No focal deficit present.     Mental Status: He is alert and oriented to person, place, and time. Mental status is at baseline.  Psychiatric:        Mood and Affect: Mood normal.        Behavior: Behavior normal.        Thought Content: Thought content normal.        Judgment: Judgment normal.    Results for orders placed or performed during the hospital encounter of 04/17/21  I-STAT creatinine  Result Value Ref Range   Creatinine, Ser 0.90 0.61 - 1.24 mg/dL      Assessment & Plan:   Problem List Items Addressed This Visit       Cardiovascular and Mediastinum   Hypertension    Under good control on current regimen. Continue current regimen. Continue to monitor. Call with any concerns. Refills given. Labs drawn today.       Relevant Medications   amLODipine (NORVASC) 2.5 MG tablet   rosuvastatin (CRESTOR) 20 MG tablet   Other Relevant Orders   Comprehensive metabolic panel   CBC with Differential/Platelet     Endocrine   Thyroid disorder    Rechecking labs today. Await results. Treat as needed.       Relevant Orders   Comprehensive metabolic panel   CBC with Differential/Platelet   TSH     Nervous and Auditory   Cellulitis of right ear - Primary    Cellulitis of EAC w significant swelling. Will treat with oral bactrim and recheck 2 weeks to confirm resolution- if not better, ?mass, will get him back to ENT        Other   Hyperlipidemia     Under good control on current regimen. Continue current regimen. Continue to monitor. Call with any concerns. Refills given. Labs drawn today.       Relevant Medications   amLODipine (NORVASC) 2.5 MG tablet   rosuvastatin (CRESTOR) 20 MG tablet   Other Relevant Orders   Comprehensive metabolic panel   CBC with Differential/Platelet   Lipid Panel w/o Chol/HDL Ratio     Follow up plan: Return in about 2 weeks (around 08/19/2021), or follow up ear.

## 2021-08-05 NOTE — Assessment & Plan Note (Signed)
Rechecking labs today. Await results. Treat as needed.  °

## 2021-08-06 ENCOUNTER — Encounter: Payer: Self-pay | Admitting: Family Medicine

## 2021-08-06 LAB — COMPREHENSIVE METABOLIC PANEL
ALT: 13 IU/L (ref 0–44)
AST: 19 IU/L (ref 0–40)
Albumin/Globulin Ratio: 2.3 — ABNORMAL HIGH (ref 1.2–2.2)
Albumin: 4.8 g/dL (ref 3.8–4.8)
Alkaline Phosphatase: 68 IU/L (ref 44–121)
BUN/Creatinine Ratio: 8 — ABNORMAL LOW (ref 10–24)
BUN: 8 mg/dL (ref 8–27)
Bilirubin Total: 0.6 mg/dL (ref 0.0–1.2)
CO2: 28 mmol/L (ref 20–29)
Calcium: 9.7 mg/dL (ref 8.6–10.2)
Chloride: 101 mmol/L (ref 96–106)
Creatinine, Ser: 1.03 mg/dL (ref 0.76–1.27)
Globulin, Total: 2.1 g/dL (ref 1.5–4.5)
Glucose: 101 mg/dL — ABNORMAL HIGH (ref 70–99)
Potassium: 4.5 mmol/L (ref 3.5–5.2)
Sodium: 142 mmol/L (ref 134–144)
Total Protein: 6.9 g/dL (ref 6.0–8.5)
eGFR: 80 mL/min/{1.73_m2} (ref >59)

## 2021-08-06 LAB — CBC WITH DIFFERENTIAL/PLATELET
Basophils Absolute: 0.1 10*3/uL (ref 0.0–0.2)
Basos: 1 %
EOS (ABSOLUTE): 0.5 10*3/uL — ABNORMAL HIGH (ref 0.0–0.4)
Eos: 8 %
Hematocrit: 37.2 % — ABNORMAL LOW (ref 37.5–51.0)
Hemoglobin: 12.2 g/dL — ABNORMAL LOW (ref 13.0–17.7)
Immature Grans (Abs): 0 10*3/uL (ref 0.0–0.1)
Immature Granulocytes: 0 %
Lymphocytes Absolute: 2.5 10*3/uL (ref 0.7–3.1)
Lymphs: 35 %
MCH: 28.2 pg (ref 26.6–33.0)
MCHC: 32.8 g/dL (ref 31.5–35.7)
MCV: 86 fL (ref 79–97)
Monocytes Absolute: 0.5 10*3/uL (ref 0.1–0.9)
Monocytes: 8 %
Neutrophils Absolute: 3.4 10*3/uL (ref 1.4–7.0)
Neutrophils: 48 %
Platelets: 248 10*3/uL (ref 150–450)
RBC: 4.33 x10E6/uL (ref 4.14–5.80)
RDW: 13.1 % (ref 11.6–15.4)
WBC: 7 10*3/uL (ref 3.4–10.8)

## 2021-08-06 LAB — LIPID PANEL W/O CHOL/HDL RATIO
Cholesterol, Total: 150 mg/dL (ref 100–199)
HDL: 42 mg/dL (ref >39)
LDL Chol Calc (NIH): 79 mg/dL (ref 0–99)
Triglycerides: 167 mg/dL — ABNORMAL HIGH (ref 0–149)
VLDL Cholesterol Cal: 29 mg/dL (ref 5–40)

## 2021-08-06 LAB — TSH: TSH: 0.492 u[IU]/mL (ref 0.450–4.500)

## 2021-08-19 ENCOUNTER — Encounter: Payer: Self-pay | Admitting: Family Medicine

## 2021-08-19 ENCOUNTER — Ambulatory Visit (INDEPENDENT_AMBULATORY_CARE_PROVIDER_SITE_OTHER): Payer: Medicare Other | Admitting: Family Medicine

## 2021-08-19 ENCOUNTER — Other Ambulatory Visit: Payer: Self-pay

## 2021-08-19 VITALS — BP 107/69 | HR 72 | Temp 97.9°F | Wt 167.2 lb

## 2021-08-19 DIAGNOSIS — H6011 Cellulitis of right external ear: Secondary | ICD-10-CM

## 2021-08-19 NOTE — Progress Notes (Signed)
BP 107/69   Pulse 72   Temp 97.9 F (36.6 C)   Wt 167 lb 3.2 oz (75.8 kg)   SpO2 100%   BMI 22.06 kg/m    Subjective:    Patient ID: Marcus Wright, male    DOB: 1956-04-05, 66 y.o.   MRN: [id number]  HPI: Marcus Wright is a 65 y.o. male  Chief Complaint  Patient presents with   Cellulitis    Follow up ear, patient states it is getting better   Finished his antibiotics. Tolerated them well. Feeling better. No significant drainage from his ear. No other concerns today.  Relevant past medical, surgical, family and social history reviewed and updated as indicated. Interim medical history since our last visit reviewed. Allergies and medications reviewed and updated.  Review of Systems  Constitutional: Negative.   Respiratory: Negative.    Cardiovascular: Negative.   Gastrointestinal: Negative.   Musculoskeletal: Negative.   Neurological: Negative.   Psychiatric/Behavioral: Negative.     Per HPI unless specifically indicated above     Objective:    BP 107/69   Pulse 72   Temp 97.9 F (36.6 C)   Wt 167 lb 3.2 oz (75.8 kg)   SpO2 100%   BMI 22.06 kg/m   Wt Readings from Last 3 Encounters:  08/19/21 167 lb 3.2 oz (75.8 kg)  08/05/21 162 lb 9.6 oz (73.8 kg)  05/07/21 161 lb 12.8 oz (73.4 kg)    Physical Exam Vitals and nursing note reviewed.  Constitutional:      General: He is not in acute distress.    Appearance: Normal appearance. He is not ill-appearing, toxic-appearing or diaphoretic.  HENT:     Head: Normocephalic and atraumatic.     Right Ear: External ear normal.     Left Ear: External ear normal.     Ears:     Comments: No more swelling in R EAC, some pus    Nose: Nose normal.     Mouth/Throat:     Mouth: Mucous membranes are moist.     Pharynx: Oropharynx is clear.  Eyes:     General: No scleral icterus.       Right eye: No discharge.        Left eye: No discharge.     Extraocular Movements: Extraocular movements intact.      Conjunctiva/sclera: Conjunctivae normal.     Pupils: Pupils are equal, round, and reactive to light.  Cardiovascular:     Rate and Rhythm: Normal rate and regular rhythm.     Pulses: Normal pulses.     Heart sounds: Normal heart sounds. No murmur heard.   No friction rub. No gallop.  Pulmonary:     Effort: Pulmonary effort is normal. No respiratory distress.     Breath sounds: Normal breath sounds. No stridor. No wheezing, rhonchi or rales.  Chest:     Chest wall: No tenderness.  Musculoskeletal:        General: Normal range of motion.     Cervical back: Normal range of motion and neck supple.  Skin:    General: Skin is warm and dry.     Capillary Refill: Capillary refill takes less than 2 seconds.     Coloration: Skin is not jaundiced or pale.     Findings: No bruising, erythema, lesion or rash.  Neurological:     General: No focal deficit present.     Mental Status: He is alert and oriented to person, place, and  time. Mental status is at baseline.  Psychiatric:        Mood and Affect: Mood normal.        Behavior: Behavior normal.        Thought Content: Thought content normal.        Judgment: Judgment normal.    Results for orders placed or performed in visit on 08/05/21  Comprehensive metabolic panel  Result Value Ref Range   Glucose 101 (H) 70 - 99 mg/dL   BUN 8 8 - 27 mg/dL   Creatinine, Ser 1.03 0.76 - 1.27 mg/dL   eGFR 80 >59 mL/min/1.73   BUN/Creatinine Ratio 8 (L) 10 - 24   Sodium 142 134 - 144 mmol/L   Potassium 4.5 3.5 - 5.2 mmol/L   Chloride 101 96 - 106 mmol/L   CO2 28 20 - 29 mmol/L   Calcium 9.7 8.6 - 10.2 mg/dL   Total Protein 6.9 6.0 - 8.5 g/dL   Albumin 4.8 3.8 - 4.8 g/dL   Globulin, Total 2.1 1.5 - 4.5 g/dL   Albumin/Globulin Ratio 2.3 (H) 1.2 - 2.2   Bilirubin Total 0.6 0.0 - 1.2 mg/dL   Alkaline Phosphatase 68 44 - 121 IU/L   AST 19 0 - 40 IU/L   ALT 13 0 - 44 IU/L  CBC with Differential/Platelet  Result Value Ref Range   WBC 7.0 3.4 - 10.8  x10E3/uL   RBC 4.33 4.14 - 5.80 x10E6/uL   Hemoglobin 12.2 (L) 13.0 - 17.7 g/dL   Hematocrit 37.2 (L) 37.5 - 51.0 %   MCV 86 79 - 97 fL   MCH 28.2 26.6 - 33.0 pg   MCHC 32.8 31.5 - 35.7 g/dL   RDW 13.1 11.6 - 15.4 %   Platelets 248 150 - 450 x10E3/uL   Neutrophils 48 Not Estab. %   Lymphs 35 Not Estab. %   Monocytes 8 Not Estab. %   Eos 8 Not Estab. %   Basos 1 Not Estab. %   Neutrophils Absolute 3.4 1.4 - 7.0 x10E3/uL   Lymphocytes Absolute 2.5 0.7 - 3.1 x10E3/uL   Monocytes Absolute 0.5 0.1 - 0.9 x10E3/uL   EOS (ABSOLUTE) 0.5 (H) 0.0 - 0.4 x10E3/uL   Basophils Absolute 0.1 0.0 - 0.2 x10E3/uL   Immature Granulocytes 0 Not Estab. %   Immature Grans (Abs) 0.0 0.0 - 0.1 x10E3/uL  Lipid Panel w/o Chol/HDL Ratio  Result Value Ref Range   Cholesterol, Total 150 100 - 199 mg/dL   Triglycerides 167 (H) 0 - 149 mg/dL   HDL 42 >39 mg/dL   VLDL Cholesterol Cal 29 5 - 40 mg/dL   LDL Chol Calc (NIH) 79 0 - 99 mg/dL  TSH  Result Value Ref Range   TSH 0.492 0.450 - 4.500 uIU/mL      Assessment & Plan:   Problem List Items Addressed This Visit       Nervous and Auditory   Cellulitis of right ear - Primary     Follow up plan: Return in about 2 months (around 10/19/2021), or physical.

## 2021-09-17 NOTE — Progress Notes (Signed)
Acute Office Visit  Subjective:    Patient ID: Marcus Wright, male    DOB: 26-Jan-1956, 65 y.o.   MRN: [id number]  Chief Complaint  Patient presents with   Sore Throat    Started yesterday    Cough    HPI Patient is in today for cough and nose running that started yesterday.   UPPER RESPIRATORY TRACT INFECTION  Worst symptom: Fever: yes Cough: yes Shortness of breath: no Wheezing: no Chest pain: no Chest tightness: no Chest congestion: no Nasal congestion: yes Runny nose: yes Post nasal drip: no Sneezing: no Sore throat: yes Swollen glands: no Sinus pressure: no Headache: yes Face pain: no Toothache: no Ear pain: yes left Ear pressure: no  Eyes red/itching:no Eye drainage/crusting: no  Vomiting: no Rash: no Fatigue: yes Sick contacts: no Strep contacts: no  Context: stable Recurrent sinusitis: no Relief with OTC cold/cough medications: yes  Treatments attempted: cough syrup, tylenol    Past Medical History:  Diagnosis Date   Cancer ([facility]) 2003   skin'   Hyperlipidemia    Hypertension    Hypothyroidism    Thyroid disorder    Wears dentures    full upper   Wears hearing aid     Past Surgical History:  Procedure Laterality Date   BACK SURGERY  2011   BREAST SURGERY Right 11-22-12   CHOLECYSTECTOMY     COLONOSCOPY     COLONOSCOPY WITH PROPOFOL N/A 07/19/2016   Procedure: COLONOSCOPY WITH PROPOFOL;  Surgeon: Lucilla Lame, MD;  Location: [facility];  Service: Endoscopy;  Laterality: N/A;   REPLACEMENT TOTAL KNEE Right 1993   SINUS ENDO W/FUSION Right 09/03/2020   Procedure: Removal of Right nasopharyngeal mass, Biopsy and frozen sections;  Surgeon: Jason Coop, DO;  Location: [facility];  Service: ENT;  Laterality: Right;   SINUS SURGERY WITH INSTATRAK  2003   cancer    History reviewed. No pertinent family history.  Social History   Socioeconomic History   Marital status: Widowed    Spouse name: Not on file   Number of  children: Not on file   Years of education: Not on file   Highest education level: Not on file  Occupational History   Not on file  Tobacco Use   Smoking status: Former   Smokeless tobacco: Never   Tobacco comments:    quit 20+ yrs ago  Vaping Use   Vaping Use: Never used  Substance and Sexual Activity   Alcohol use: Yes    Comment: on occasion, 1-2 beers or wine occ.    Drug use: No   Sexual activity: Not Currently  Other Topics Concern   Not on file  Social History Narrative   Not on file   Social Determinants of Health   Financial Resource Strain: Not on file  Food Insecurity: Not on file  Transportation Needs: Not on file  Physical Activity: Not on file  Stress: Not on file  Social Connections: Not on file  Intimate Partner Violence: Not on file    Outpatient Medications Prior to Visit  Medication Sig Dispense Refill   albuterol (VENTOLIN HFA) 108 (90 Base) MCG/ACT inhaler Inhale 2 puffs into the lungs every 4 (four) hours as needed for wheezing or shortness of breath. 1 each 0   amLODipine (NORVASC) 2.5 MG tablet Take 1 tablet (2.5 mg total) by mouth daily. 90 tablet 1   fluticasone (FLONASE) 50 MCG/ACT nasal spray Place 2 sprays into both nostrils daily. Lucas  g 6   levothyroxine (SYNTHROID) 50 MCG tablet TAKE 1 TABLET BY MOUTH EVERY DAY 90 tablet 3   ofloxacin (OCUFLOX) 0.3 % ophthalmic solution SMARTSIG:4 Drop(s) In Ear(s)     omeprazole (PRILOSEC) 20 MG capsule Take 1 capsule (20 mg total) by mouth daily. 30 capsule 3   rosuvastatin (CRESTOR) 20 MG tablet Take 1 tablet (20 mg total) by mouth daily. 90 tablet 1   No facility-administered medications prior to visit.    Allergies  Allergen Reactions   Doxazosin Hives    Unknown     Review of Systems  Constitutional:  Positive for fatigue and fever.  HENT:  Positive for congestion, ear pain, rhinorrhea and sore throat. Negative for postnasal drip, sinus pressure and sneezing.   Eyes: Negative.   Respiratory:   Positive for cough. Negative for shortness of breath.   Cardiovascular: Negative.   Gastrointestinal: Negative.   Endocrine: Negative.   Genitourinary: Negative.   Musculoskeletal:  Positive for myalgias.  Skin: Negative.   Neurological:  Positive for headaches. Negative for dizziness.  Psychiatric/Behavioral: Negative.        Objective:    Physical Exam Vitals and nursing note reviewed.  Constitutional:      Appearance: Normal appearance.  HENT:     Head: Normocephalic.     Right Ear: Ear canal and external ear normal.     Left Ear: Ear canal and external ear normal.     Ears:     Comments: Drainage to bilateral ears Eyes:     Conjunctiva/sclera: Conjunctivae normal.  Cardiovascular:     Rate and Rhythm: Normal rate and regular rhythm.     Pulses: Normal pulses.     Heart sounds: Normal heart sounds.  Pulmonary:     Effort: Pulmonary effort is normal.     Breath sounds: Normal breath sounds.  Musculoskeletal:     Cervical back: Normal range of motion.  Skin:    General: Skin is warm.  Neurological:     General: No focal deficit present.     Mental Status: He is alert and oriented to person, place, and time.  Psychiatric:        Mood and Affect: Mood normal.        Behavior: Behavior normal.        Thought Content: Thought content normal.        Judgment: Judgment normal.    BP 101/66    Pulse 90    Temp 98.6 F (37 C) (Oral)    Ht 6' 0.99" (1.854 m)    Wt 170 lb (77.1 kg)    SpO2 97%    BMI 22.43 kg/m  Wt Readings from Last 3 Encounters:  09/18/21 170 lb (77.1 kg)  08/19/21 167 lb 3.2 oz (75.8 kg)  08/05/21 162 lb 9.6 oz (73.8 kg)    Health Maintenance Due  Topic Date Due   COVID-19 Vaccine (1) Never done   Zoster Vaccines- Shingrix (1 of 2) Never done   INFLUENZA VACCINE  04/27/2021    There are no preventive care reminders to display for this patient.   Lab Results  Component Value Date   TSH 0.492 08/05/2021   Lab Results  Component Value  Date   WBC 7.0 08/05/2021   HGB 12.2 (L) 08/05/2021   HCT 37.2 (L) 08/05/2021   MCV 86 08/05/2021   PLT 248 08/05/2021   Lab Results  Component Value Date   NA 142 08/05/2021   K 4.5 08/05/2021  CO2 28 08/05/2021   GLUCOSE 101 (H) 08/05/2021   BUN 8 08/05/2021   CREATININE 1.03 08/05/2021   BILITOT 0.6 08/05/2021   ALKPHOS 68 08/05/2021   AST 19 08/05/2021   ALT 13 08/05/2021   PROT 6.9 08/05/2021   ALBUMIN 4.8 08/05/2021   CALCIUM 9.7 08/05/2021   ANIONGAP 9 12/19/2020   EGFR 80 08/05/2021   Lab Results  Component Value Date   CHOL 150 08/05/2021   Lab Results  Component Value Date   HDL 42 08/05/2021   Lab Results  Component Value Date   LDLCALC 79 08/05/2021   Lab Results  Component Value Date   TRIG 167 (H) 08/05/2021   No results found for: CHOLHDL No results found for: HGBA1C     Assessment & Plan:   Problem List Items Addressed This Visit   None Visit Diagnoses     Sore throat    -  Primary   Strep, flu negative. Covid-19 pending. Will treat with prednisone and tessalon prn. Encouraged rest and fluids. F/U if not improving.    Relevant Orders   Novel Coronavirus, NAA ([facility])   Rapid Strep Screen ([facility] ONLY)   Veritor Flu A/B Waived        Meds ordered this encounter  Medications   predniSONE (DELTASONE) 20 MG tablet    Sig: Take 1 tablet (20 mg total) by mouth daily with breakfast.    Dispense:  5 tablet    Refill:  0   benzonatate (TESSALON) 100 MG capsule    Sig: Take 1 capsule (100 mg total) by mouth 3 (three) times daily as needed for cough.    Dispense:  30 capsule    Refill:  0     Charyl Dancer, NP

## 2021-09-18 ENCOUNTER — Other Ambulatory Visit: Payer: Self-pay

## 2021-09-18 ENCOUNTER — Encounter: Payer: Self-pay | Admitting: Nurse Practitioner

## 2021-09-18 ENCOUNTER — Ambulatory Visit (INDEPENDENT_AMBULATORY_CARE_PROVIDER_SITE_OTHER): Payer: Medicare Other | Admitting: Nurse Practitioner

## 2021-09-18 VITALS — BP 101/66 | HR 90 | Temp 98.6°F | Ht 72.99 in | Wt 170.0 lb

## 2021-09-18 DIAGNOSIS — J029 Acute pharyngitis, unspecified: Secondary | ICD-10-CM | POA: Diagnosis not present

## 2021-09-18 MED ORDER — BENZONATATE 100 MG PO CAPS: 100.0000 mg | ORAL_CAPSULE | Freq: Three times a day (TID) | ORAL | 0 refills | Status: DC | PRN

## 2021-09-18 MED ORDER — PREDNISONE 20 MG PO TABS: 20.0000 mg | ORAL_TABLET | Freq: Every day | ORAL | 0 refills | Status: DC

## 2021-09-19 LAB — SARS-COV-2, NAA 2 DAY TAT

## 2021-09-19 LAB — NOVEL CORONAVIRUS, NAA: SARS-CoV-2, NAA: NOT DETECTED

## 2021-09-20 ENCOUNTER — Other Ambulatory Visit: Payer: Self-pay | Admitting: Family Medicine

## 2021-09-22 LAB — RAPID STREP SCREEN (MED CTR MEBANE ONLY): Strep Gp A Ag, IA W/Reflex: NEGATIVE

## 2021-09-22 LAB — VERITOR FLU A/B WAIVED
Influenza A: NEGATIVE
Influenza B: NEGATIVE

## 2021-09-22 LAB — CULTURE, GROUP A STREP: Strep A Culture: NEGATIVE

## 2021-09-22 NOTE — Telephone Encounter (Signed)
Requested Prescriptions  Pending Prescriptions Disp Refills   fluticasone (FLONASE) 50 MCG/ACT nasal spray [Pharmacy Med Name: FLUTICASONE PROP 50 MCG SPRAY] 48 mL 2    Sig: SPRAY 2 SPRAYS INTO EACH NOSTRIL EVERY DAY     Ear, Nose, and Throat: Nasal Preparations - Corticosteroids Passed - 09/20/2021  9:14 AM      Passed - Valid encounter within last 12 months    Recent Outpatient Visits          4 days ago Sore throat   [place], Lauren A, NP   1 month ago Cellulitis of right ear   [facility], Megan P, DO   1 month ago Cellulitis of right ear   [place], Megan P, DO   4 months ago 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

## 2021-10-07 ENCOUNTER — Encounter: Payer: Medicare Other | Admitting: Family Medicine

## 2021-10-23 ENCOUNTER — Other Ambulatory Visit: Payer: Self-pay

## 2021-10-23 ENCOUNTER — Ambulatory Visit
Admission: RE | Admit: 2021-10-23 | Discharge: 2021-10-23 | Disposition: A | Discharge status: Home / Self Care | Payer: Medicare Other | Source: Ambulatory Visit | Attending: Oncology | Admitting: Oncology

## 2021-10-23 ENCOUNTER — Ambulatory Visit: Payer: Medicare Other | Admitting: Radiation Oncology

## 2021-10-23 DIAGNOSIS — R590 Localized enlarged lymph nodes: Secondary | ICD-10-CM | POA: Diagnosis not present

## 2021-10-23 DIAGNOSIS — J392 Other diseases of pharynx: Secondary | ICD-10-CM | POA: Diagnosis not present

## 2021-10-23 DIAGNOSIS — C760 Malignant neoplasm of head, face and neck: Secondary | ICD-10-CM | POA: Diagnosis not present

## 2021-10-23 DIAGNOSIS — I6521 Occlusion and stenosis of right carotid artery: Secondary | ICD-10-CM | POA: Diagnosis not present

## 2021-10-23 DIAGNOSIS — Z85828 Personal history of other malignant neoplasm of skin: Secondary | ICD-10-CM | POA: Diagnosis not present

## 2021-10-23 LAB — POCT I-STAT CREATININE: Creatinine, Ser: 1 mg/dL (ref 0.61–1.24)

## 2021-10-23 MED ORDER — IOHEXOL 300 MG/ML  SOLN
75.0000 mL | Freq: Once | INTRAMUSCULAR | Status: AC | PRN
  Administered 2021-10-23: 75 mL via INTRAVENOUS

## 2021-10-27 ENCOUNTER — Other Ambulatory Visit: Payer: Self-pay

## 2021-10-27 ENCOUNTER — Ambulatory Visit
Admission: RE | Admit: 2021-10-27 | Discharge: 2021-10-27 | Disposition: A | Discharge status: Home / Self Care | Payer: Medicare Other | Source: Ambulatory Visit | Attending: Radiation Oncology | Admitting: Radiation Oncology

## 2021-10-27 ENCOUNTER — Inpatient Hospital Stay: Payer: Medicare Other | Attending: Oncology | Admitting: Oncology

## 2021-10-27 VITALS — BP 121/77 | HR 72 | Temp 97.8°F | Wt 165.3 lb

## 2021-10-27 DIAGNOSIS — Z9221 Personal history of antineoplastic chemotherapy: Secondary | ICD-10-CM | POA: Insufficient documentation

## 2021-10-27 DIAGNOSIS — Z923 Personal history of irradiation: Secondary | ICD-10-CM | POA: Insufficient documentation

## 2021-10-27 DIAGNOSIS — C119 Malignant neoplasm of nasopharynx, unspecified: Secondary | ICD-10-CM

## 2021-10-27 DIAGNOSIS — Z85818 Personal history of malignant neoplasm of other sites of lip, oral cavity, and pharynx: Secondary | ICD-10-CM | POA: Insufficient documentation

## 2021-10-27 DIAGNOSIS — J392 Other diseases of pharynx: Secondary | ICD-10-CM | POA: Diagnosis not present

## 2021-10-27 NOTE — Progress Notes (Signed)
[facility]  [phone] Fax:([phone]) [phone]  ID: Thadd Apuzzo OB: [birth year]  MR#: [id number]  KYH#:[id number]  Patient Care Team: Valerie Roys, DO as PCP - General (Family Medicine) Clyde Canterbury, MD as Referring Physician (Otolaryngology)  CHIEF COMPLAINT: Right nasopharyngeal mass.  INTERVAL HISTORY: Patient returns to clinic today for further evaluation and discussion of his imaging results.  Much of the history once again is given by his granddaughter.  He continues to feel well and is at his baseline.  He denies any pain or dysphagia.  He has a good appetite and denies weight loss.  He has no neurologic complaints.  He denies any recent fevers or illnesses.  He has no chest pain, shortness of breath, cough, or hemoptysis.  Patient denies any nausea, vomiting, constipation, or diarrhea.  He has no urinary complaints.  Patient offers no specific complaints today.  REVIEW OF SYSTEMS:   Review of Systems  Constitutional: Negative.  Negative for fever, malaise/fatigue and weight loss.  Respiratory: Negative.  Negative for cough, hemoptysis and shortness of breath.   Cardiovascular: Negative.  Negative for chest pain and leg swelling.  Gastrointestinal: Negative.  Negative for abdominal pain.  Genitourinary: Negative.  Negative for dysuria.  Musculoskeletal: Negative.  Negative for neck pain.  Skin: Negative.  Negative for rash.  Neurological: Negative.  Negative for dizziness, focal weakness, weakness and headaches.  Psychiatric/Behavioral: Negative.  The patient is not nervous/anxious.    As per HPI. Otherwise, a complete review of systems is negative.  PAST MEDICAL HISTORY: Past Medical History:  Diagnosis Date   Cancer [facility]) 2003   skin'   Hyperlipidemia    Hypertension    Hypothyroidism    Thyroid disorder    Wears dentures    full upper   Wears hearing aid     PAST SURGICAL HISTORY: Past Surgical History:  Procedure Laterality  Date   BACK SURGERY  2011   BREAST SURGERY Right 11-22-12   CHOLECYSTECTOMY     COLONOSCOPY     COLONOSCOPY WITH PROPOFOL N/A 07/19/2016   Procedure: COLONOSCOPY WITH PROPOFOL;  Surgeon: Lucilla Lame, MD;  Location: [facility];  Service: Endoscopy;  Laterality: N/A;   REPLACEMENT TOTAL KNEE Right 1993   SINUS ENDO W/FUSION Right 09/03/2020   Procedure: Removal of Right nasopharyngeal mass, Biopsy and frozen sections;  Surgeon: Jason Coop, DO;  Location: [facility];  Service: ENT;  Laterality: Right;   SINUS SURGERY WITH INSTATRAK  2003   cancer    FAMILY HISTORY: No family history on file.  ADVANCED DIRECTIVES (Y/N):  N  HEALTH MAINTENANCE: Social History   Tobacco Use   Smoking status: Former   Smokeless tobacco: Never   Tobacco comments:    quit 20+ yrs ago  Vaping Use   Vaping Use: Never used  Substance Use Topics   Alcohol use: Yes    Comment: on occasion, 1-2 beers or wine occ.    Drug use: No     Colonoscopy:  PAP:  Bone density:  Lipid panel:  Allergies  Allergen Reactions   Doxazosin Hives    Unknown     Current Outpatient Medications  Medication Sig Dispense Refill   albuterol (VENTOLIN HFA) 108 (90 Base) MCG/ACT inhaler Inhale 2 puffs into the lungs every 4 (four) hours as needed for wheezing or shortness of breath. 1 each 0   amLODipine (NORVASC) 2.5 MG tablet Take 1 tablet (2.5 mg total) by mouth daily. 90 tablet 1  benzonatate (TESSALON) 100 MG capsule Take 1 capsule (100 mg total) by mouth 3 (three) times daily as needed for cough. 30 capsule 0   fluticasone (FLONASE) 50 MCG/ACT nasal spray SPRAY 2 SPRAYS INTO EACH NOSTRIL EVERY DAY 48 mL 2   levothyroxine (SYNTHROID) 50 MCG tablet TAKE 1 TABLET BY MOUTH EVERY DAY 90 tablet 3   ofloxacin (OCUFLOX) 0.3 % ophthalmic solution SMARTSIG:4 Drop(s) In Ear(s)     omeprazole (PRILOSEC) 20 MG capsule Take 1 capsule (20 mg total) by mouth daily. 30 capsule 3   predniSONE (DELTASONE) 20 MG tablet  Take 1 tablet (20 mg total) by mouth daily with breakfast. 5 tablet 0   rosuvastatin (CRESTOR) 20 MG tablet Take 1 tablet (20 mg total) by mouth daily. 90 tablet 1   No current facility-administered medications for this visit.    OBJECTIVE: There were no vitals filed for this visit.    There is no height or weight on file to calculate BMI.    ECOG FS:1 - Symptomatic but completely ambulatory  General: Well-developed, well-nourished, no acute distress. Eyes: Pink conjunctiva, anicteric sclera. HEENT: Normocephalic, moist mucous membranes.  No palpable lymphadenopathy. Lungs: No audible wheezing or coughing. Heart: Regular rate and rhythm. Abdomen: Soft, nontender, no obvious distention. Musculoskeletal: No edema, cyanosis, or clubbing. Neuro: Alert, answering all questions appropriately. Cranial nerves grossly intact. Skin: No rashes or petechiae noted. Psych: Normal affect.   LAB RESULTS:  Lab Results  Component Value Date   NA 142 08/05/2021   K 4.5 08/05/2021   CL 101 08/05/2021   CO2 28 08/05/2021   GLUCOSE 101 (H) 08/05/2021   BUN 8 08/05/2021   CREATININE 1.00 10/23/2021   CALCIUM 9.7 08/05/2021   PROT 6.9 08/05/2021   ALBUMIN 4.8 08/05/2021   AST 19 08/05/2021   ALT 13 08/05/2021   ALKPHOS 68 08/05/2021   BILITOT 0.6 08/05/2021   GFRNONAA >60 12/19/2020   GFRAA 99 06/03/2020    Lab Results  Component Value Date   WBC 7.0 08/05/2021   NEUTROABS 3.4 08/05/2021   HGB 12.2 (L) 08/05/2021   HCT 37.2 (L) 08/05/2021   MCV 86 08/05/2021   PLT 248 08/05/2021     STUDIES: CT SOFT TISSUE NECK W CONTRAST  Result Date: 10/25/2021 CLINICAL DATA:  Head neck cancer surveillance. Nasopharyngeal carcinoma. History of squamous cell skin carcinoma near the shoulder. Status post radiation and surgery. EXAM: CT NECK WITH CONTRAST TECHNIQUE: Multidetector CT imaging of the neck was performed using the standard protocol following the bolus administration of intravenous  contrast. RADIATION DOSE REDUCTION: This exam was performed according to the departmental dose-optimization program which includes automated exposure control, adjustment of the mA and/or kV according to patient size and/or use of iterative reconstruction technique. CONTRAST:  74mL OMNIPAQUE IOHEXOL 300 MG/ML  SOLN COMPARISON:  04/17/2021 FINDINGS: PHARYNX AND LARYNX: Asymmetry of the right nasopharynx is unchanged. The lower pharynx, epiglottis and larynx are normal. No retropharyngeal abnormality. SALIVARY GLANDS: No focal abnormality of the parotid or submandibular glands. THYROID: Normal. LYMPH NODES: Unchanged level 1B lymph nodes, measuring 10 mm on the right and 9 mm on the left. VASCULAR: Unchanged thickening surrounding the right ICA near the skull base. Moderate atherosclerosis of the proximal right common carotid artery is unchanged. LIMITED INTRACRANIAL: Normal. VISUALIZED ORBITS: Normal. MASTOIDS AND VISUALIZED PARANASAL SINUSES: Right mastoid effusion SKELETON: Mixed lytic and sclerotic change of the skull base UPPER CHEST: Clear. OTHER: None. IMPRESSION: 1. Unchanged appearance of the right nasopharynx and surrounding  soft tissues. 2. Unchanged level 1B lymph nodes. Electronically Signed   By: Ulyses Jarred M.D.   On: 10/25/2021 00:58     ASSESSMENT: Right nasopharyngeal mass.  PLAN:    1. Right nasopharyngeal mass: Patient underwent chemotherapy and XRT nearly 20 years ago for squamous cell carcinoma in the same anatomic area.  Previously, he was evaluated by ENT and underwent endoscopy which did not reveal anything they could biopsy.  PET scan results from January 09, 2021 reviewed independently with hypermetabolic mass highly suspicious for recurrence.  Since no biopsy could be performed, it was decided upon to proceed with active surveillance.  His most recent imaging with CT scan on October 25, 2021 reviewed independently and reported as above with unchanged appearance of right nasopharynx  lesions.  No intervention is needed at this time.  Return to clinic in 6 months with repeat imaging and further evaluation.  If imaging remains unchanged and patient is asymptomatic, can possibly be discharged from clinic.    I spent a total of 20 minutes reviewing chart data, face-to-face evaluation with the patient, counseling and coordination of care as detailed above.  Patient expressed understanding and was in agreement with this plan. He also understands that He can call clinic at any time with any questions, concerns, or complaints.    Lloyd Huger, MD   10/27/2021 1:45 PM

## 2021-10-27 NOTE — Progress Notes (Signed)
Radiation Oncology Follow up Note  Name: Marcus Wright   Date:   10/27/2021 MRN:  [id number] DOB: 1956/02/11    This 66 y.o. male presents to the clinic today for follow-up and suspected recurrence of major pharyngeal carcinoma treated 20 years prior.  REFERRING PROVIDER: Valerie Roys, DO  HPI: Patient is a 66 year old male treated 20 years prior for nasopharyngeal carcinoma with concurrent chemoradiation therapy.  Approximately year ago he had some findings.  Including a CT scan which showed questionable mass in the nasopharynx.  Biopsy of this area was negative for malignancy.  We have been following this closely and again a year out most recent CT scan shows unchanged appearance of the right nasopharynx and surrounding soft tissues and unchanged level 1B lymph nodes.  He is fairly asymptomatic does have profound hearing loss.  COMPLICATIONS OF TREATMENT: none  FOLLOW UP COMPLIANCE: keeps appointments   PHYSICAL EXAM:  BP 121/77    Pulse 72    Temp 97.8 F (36.6 C)    Wt 165 lb 4.8 oz (75 kg)    BMI 21.81 kg/m  Well-developed well-nourished patient in NAD. HEENT reveals PERLA, EOMI, discs not visualized.  Oral cavity is clear. No oral mucosal lesions are identified. Neck is clear without evidence of cervical or supraclavicular adenopathy. Lungs are clear to A&P. Cardiac examination is essentially unremarkable with regular rate and rhythm without murmur rub or thrill. Abdomen is benign with no organomegaly or masses noted. Motor sensory and DTR levels are equal and symmetric in the upper and lower extremities. Cranial nerves II through XII are grossly intact. Proprioception is intact. No peripheral adenopathy or edema is identified. No motor or sensory levels are noted. Crude visual fields are within normal range.  RADIOLOGY RESULTS: Serial CT scans reviewed compatible with above-stated findings  PLAN: Present time would dismiss any changes from his previous CT scan close to a year  ago since there has been no progression on serial CT scans.  I am dismissing him as a follow-up patient.  He will continue close follow-up care with ENT.  We will be happy to reevaluate the patient anytime should further consultation be indicated.  Patient comprehends my recommendations well.  I would like to take this opportunity to thank you for allowing me to participate in the care of your patient.Noreene Filbert, MD

## 2021-11-06 ENCOUNTER — Other Ambulatory Visit: Payer: Self-pay | Admitting: Family Medicine

## 2021-11-06 NOTE — Telephone Encounter (Signed)
Requested Prescriptions  Pending Prescriptions Disp Refills   omeprazole (PRILOSEC) 20 MG capsule [Pharmacy Med Name: OMEPRAZOLE DR 20 MG CAPSULE] 90 capsule 1    Sig: TAKE 1 CAPSULE BY MOUTH EVERY DAY     Gastroenterology: Proton Pump Inhibitors Passed - 11/06/2021  1:40 AM      Passed - Valid encounter within last 12 months    Recent Outpatient Visits          1 month ago Sore throat   [place], NP   2 months ago Cellulitis of right ear   [place], Megan P, DO   3 months ago Cellulitis of right ear   [place], Megan P, DO   6 months ago Squamous cell carcinoma of skin of right upper extremity, including shoulder   [place], Megan P, DO   9 months ago Injection site reaction, initial encounter   [employer], Barb Merino, DO      Future Appointments            In 4 days Wynetta Emery, Barb Merino, DO [employer], [place]

## 2021-11-10 ENCOUNTER — Ambulatory Visit (INDEPENDENT_AMBULATORY_CARE_PROVIDER_SITE_OTHER): Payer: Medicare Other | Admitting: Family Medicine

## 2021-11-10 ENCOUNTER — Other Ambulatory Visit: Payer: Self-pay

## 2021-11-10 ENCOUNTER — Encounter: Payer: Self-pay | Admitting: Family Medicine

## 2021-11-10 VITALS — BP 102/69 | HR 79 | Temp 97.9°F | Ht 72.0 in | Wt 161.2 lb

## 2021-11-10 DIAGNOSIS — H538 Other visual disturbances: Secondary | ICD-10-CM

## 2021-11-10 DIAGNOSIS — E079 Disorder of thyroid, unspecified: Secondary | ICD-10-CM | POA: Diagnosis not present

## 2021-11-10 DIAGNOSIS — E782 Mixed hyperlipidemia: Secondary | ICD-10-CM

## 2021-11-10 DIAGNOSIS — I10 Essential (primary) hypertension: Secondary | ICD-10-CM

## 2021-11-10 DIAGNOSIS — R3911 Hesitancy of micturition: Secondary | ICD-10-CM

## 2021-11-10 DIAGNOSIS — Z0000 Encounter for general adult medical examination without abnormal findings: Secondary | ICD-10-CM

## 2021-11-10 LAB — MICROSCOPIC EXAMINATION
Bacteria, UA: NONE SEEN
Epithelial Cells (non renal): NONE SEEN /hpf (ref 0–10)
WBC, UA: NONE SEEN /hpf (ref 0–5)

## 2021-11-10 LAB — URINALYSIS, ROUTINE W REFLEX MICROSCOPIC
Bilirubin, UA: NEGATIVE
Glucose, UA: NEGATIVE
Ketones, UA: NEGATIVE
Leukocytes,UA: NEGATIVE
Nitrite, UA: NEGATIVE
Protein,UA: NEGATIVE
Specific Gravity, UA: 1.01 (ref 1.005–1.030)
Urobilinogen, Ur: 0.2 mg/dL (ref 0.2–1.0)
pH, UA: 6.5 (ref 5.0–7.5)

## 2021-11-10 LAB — MICROALBUMIN, URINE WAIVED
Creatinine, Urine Waived: 50 mg/dL (ref 10–300)
Microalb, Ur Waived: 10 mg/L (ref 0–19)
Microalb/Creat Ratio: <30 mg/g (ref <30)

## 2021-11-10 MED ORDER — FLUTICASONE PROPIONATE 50 MCG/ACT NA SUSP: NASAL | 4 refills | Status: DC

## 2021-11-10 MED ORDER — AMLODIPINE BESYLATE 2.5 MG PO TABS: 2.5000 mg | ORAL_TABLET | Freq: Every day | ORAL | 1 refills | Status: DC

## 2021-11-10 MED ORDER — ROSUVASTATIN CALCIUM 20 MG PO TABS: 20.0000 mg | ORAL_TABLET | Freq: Every day | ORAL | 1 refills | Status: DC

## 2021-11-10 MED ORDER — OMEPRAZOLE 20 MG PO CPDR: DELAYED_RELEASE_CAPSULE | ORAL | 3 refills | Status: DC

## 2021-11-10 MED ORDER — GLYCERIN (ADULT) 2 G RE SUPP: 1.0000 | RECTAL | 12 refills | Status: DC | PRN

## 2021-11-10 MED ORDER — ALBUTEROL SULFATE HFA 108 (90 BASE) MCG/ACT IN AERS: 2.0000 | INHALATION_SPRAY | RESPIRATORY_TRACT | 6 refills | Status: DC | PRN

## 2021-11-10 NOTE — Assessment & Plan Note (Signed)
Under good control on current regimen. Continue current regimen. Continue to monitor. Call with any concerns. Refills given. Labs drawn today.   

## 2021-11-10 NOTE — Assessment & Plan Note (Signed)
Rechecking labs today. Await results. Treat as needed.  °

## 2021-11-10 NOTE — Progress Notes (Signed)
BP 102/69    Pulse 79    Temp 97.9 F (36.6 C)    Ht 6' (1.829 m)    Wt 161 lb 3.2 oz (73.1 kg)    SpO2 98%    BMI 21.86 kg/m    Subjective:    Patient ID: Marcus Wright, male    DOB: 12-15-1955, 66 y.o.   MRN: [id number]  HPI: Marcus Wright is a 66 y.o. male presenting on 11/10/2021 for comprehensive medical examination. Current medical complaints include:  HYPERTENSION / HYPERLIPIDEMIA Satisfied with current treatment? yes Duration of hypertension: chronic BP monitoring frequency: not checking BP medication side effects: no Past BP meds: amlodipine Duration of hyperlipidemia: chronic Cholesterol medication side effects: no Cholesterol supplements: none Past cholesterol medications: crestor Medication compliance: excellent compliance Aspirin: no Recent stressors: no Recurrent headaches: no Visual changes: no Palpitations: no Dyspnea: no Chest pain: no Lower extremity edema: no Dizzy/lightheaded: no  HYPOTHYROIDISM Thyroid control status:stable Satisfied with current treatment? yes Medication side effects: no Medication compliance: excellent compliance Etiology of hypothyroidism:  Recent dose adjustment:no Fatigue: no Cold intolerance: no Heat intolerance: no Weight gain: no Weight loss: no Constipation: yes Diarrhea/loose stools: no Palpitations: no Lower extremity edema: no Anxiety/depressed mood: no  Interim Problems from his last visit: no  Depression Screen done today and results listed below:  Depression screen [facility] 2/9 11/10/2021 08/05/2021 08/01/2020 06/03/2020 05/28/2019  Decreased Interest 0 0 0 0 0  Down, Depressed, Hopeless 0 0 0 0 0  PHQ - 2 Score 0 0 0 0 0  Altered sleeping 0 0 0 0 -  Tired, decreased energy 0 0 0 0 -  Change in appetite 0 0 0 0 -  Feeling bad or failure about yourself  0 0 0 0 -  Trouble concentrating 0 0 0 0 -  Moving slowly or fidgety/restless 0 0 0 0 -  Suicidal thoughts 0 0 0 0 -  PHQ-9 Score 0 0 0 0 -  Difficult  doing work/chores - Not difficult at all Not difficult at all Not difficult at all -    Past Medical History:  Past Medical History:  Diagnosis Date   Cancer ([place]) 2003   skin'   Hyperlipidemia    Hypertension    Hypothyroidism    Thyroid disorder    Wears dentures    full upper   Wears hearing aid     Surgical History:  Past Surgical History:  Procedure Laterality Date   BACK SURGERY  2011   BREAST SURGERY Right 11-22-12   CHOLECYSTECTOMY     COLONOSCOPY     COLONOSCOPY WITH PROPOFOL N/A 07/19/2016   Procedure: COLONOSCOPY WITH PROPOFOL;  Surgeon: Lucilla Lame, MD;  Location: [place];  Service: Endoscopy;  Laterality: N/A;   REPLACEMENT TOTAL KNEE Right 1993   SINUS ENDO W/FUSION Right 09/03/2020   Procedure: Removal of Right nasopharyngeal mass, Biopsy and frozen sections;  Surgeon: Jason Coop, DO;  Location: [place] [facility];  Service: ENT;  Laterality: Right;   SINUS SURGERY WITH INSTATRAK  2003   cancer    Medications:  Current Outpatient Medications on File Prior to Visit  Medication Sig   ofloxacin (OCUFLOX) 0.3 % ophthalmic solution SMARTSIG:4 Drop(s) In Ear(s)   levothyroxine (SYNTHROID) 50 MCG tablet TAKE 1 TABLET BY MOUTH EVERY DAY   No current facility-administered medications on file prior to visit.    Allergies:  Allergies  Allergen Reactions   Doxazosin Hives    Unknown  Social History:  Social History   Socioeconomic History   Marital status: Widowed    Spouse name: Not on file   Number of children: Not on file   Years of education: Not on file   Highest education level: Not on file  Occupational History   Not on file  Tobacco Use   Smoking status: Former   Smokeless tobacco: Never   Tobacco comments:    quit 20+ yrs ago  Vaping Use   Vaping Use: Never used  Substance and Sexual Activity   Alcohol use: Yes    Comment: on occasion, 1-2 beers or wine occ.    Drug use: No   Sexual activity: Not Currently  Other Topics  Concern   Not on file  Social History Narrative   Not on file   Social Determinants of Health   Financial Resource Strain: Not on file  Food Insecurity: Not on file  Transportation Needs: Not on file  Physical Activity: Not on file  Stress: Not on file  Social Connections: Not on file  Intimate Partner Violence: Not on file   Social History   Tobacco Use  Smoking Status Former  Smokeless Tobacco Never  Tobacco Comments   quit 20+ yrs ago   Social History   Substance and Sexual Activity  Alcohol Use Yes   Comment: on occasion, 1-2 beers or wine occ.     Family History:  History reviewed. No pertinent family history.  Past medical history, surgical history, medications, allergies, family history and social history reviewed with patient today and changes made to appropriate areas of the chart.   Review of Systems  Constitutional: Negative.  Negative for chills and fever.  HENT:  Positive for ear discharge and hearing loss. Negative for congestion, ear pain, nosebleeds, sinus pain, sore throat and tinnitus.   Eyes:  Positive for blurred vision. Negative for double vision, photophobia, pain, discharge and redness.  Respiratory: Negative.  Negative for stridor.   Cardiovascular: Negative.   Gastrointestinal:  Positive for constipation, heartburn and nausea. Negative for abdominal pain, blood in stool, diarrhea, melena and vomiting.  Genitourinary: Negative.   Musculoskeletal: Negative.   Neurological: Negative.   Endo/Heme/Allergies: Negative.   Psychiatric/Behavioral: Negative.    All other ROS negative except what is listed above and in the HPI.      Objective:    BP 102/69    Pulse 79    Temp 97.9 F (36.6 C)    Ht 6' (1.829 m)    Wt 161 lb 3.2 oz (73.1 kg)    SpO2 98%    BMI 21.86 kg/m   Wt Readings from Last 3 Encounters:  11/10/21 161 lb 3.2 oz (73.1 kg)  10/27/21 165 lb 4.8 oz (75 kg)  09/18/21 170 lb (77.1 kg)    Physical Exam Vitals and nursing note  reviewed.  Constitutional:      General: He is not in acute distress.    Appearance: Normal appearance. He is normal weight. He is not ill-appearing, toxic-appearing or diaphoretic.  HENT:     Head: Normocephalic and atraumatic.     Comments: Profoundly deaf    Right Ear: External ear normal. There is no impacted cerumen.     Left Ear: External ear normal. There is no impacted cerumen.     Nose: Nose normal. No congestion or rhinorrhea.     Mouth/Throat:     Mouth: Mucous membranes are moist.     Pharynx: Oropharynx is clear. No oropharyngeal  exudate or posterior oropharyngeal erythema.  Eyes:     General: No scleral icterus.       Right eye: No discharge.        Left eye: No discharge.     Extraocular Movements: Extraocular movements intact.     Conjunctiva/sclera: Conjunctivae normal.     Pupils: Pupils are equal, round, and reactive to light.  Neck:     Vascular: No carotid bruit.  Cardiovascular:     Rate and Rhythm: Normal rate and regular rhythm.     Pulses: Normal pulses.     Heart sounds: No murmur heard.   No friction rub. No gallop.  Pulmonary:     Effort: Pulmonary effort is normal. No respiratory distress.     Breath sounds: Normal breath sounds. No stridor. No wheezing, rhonchi or rales.  Chest:     Chest wall: No tenderness.  Abdominal:     General: Abdomen is flat. Bowel sounds are normal. There is no distension.     Palpations: Abdomen is soft. There is no mass.     Tenderness: There is no abdominal tenderness. There is no right CVA tenderness, left CVA tenderness, guarding or rebound.     Hernia: No hernia is present.  Genitourinary:    Comments: Genital exam deferred with shared decision making Musculoskeletal:        General: No swelling, tenderness, deformity or signs of injury.     Cervical back: Normal range of motion and neck supple. No rigidity. No muscular tenderness.     Right lower leg: No edema.     Left lower leg: No edema.  Lymphadenopathy:      Cervical: No cervical adenopathy.  Skin:    General: Skin is warm and dry.     Capillary Refill: Capillary refill takes less than 2 seconds.     Coloration: Skin is not jaundiced or pale.     Findings: No bruising, erythema, lesion or rash.  Neurological:     General: No focal deficit present.     Mental Status: He is alert and oriented to person, place, and time.     Cranial Nerves: No cranial nerve deficit.     Sensory: No sensory deficit.     Motor: No weakness.     Coordination: Coordination normal.     Gait: Gait normal.     Deep Tendon Reflexes: Reflexes normal.  Psychiatric:        Mood and Affect: Mood normal.        Behavior: Behavior normal.        Thought Content: Thought content normal.        Judgment: Judgment normal.    Results for orders placed or performed in visit on 11/10/21  Microscopic Examination   Urine  Result Value Ref Range   WBC, UA None seen 0 - 5 /hpf   RBC 0-2 0 - 2 /hpf   Epithelial Cells (non renal) None seen 0 - 10 /hpf   Bacteria, UA None seen None seen/Few  Urinalysis, Routine w reflex microscopic  Result Value Ref Range   Specific Gravity, UA 1.010 1.005 - 1.030   pH, UA 6.5 5.0 - 7.5   Color, UA Yellow Yellow   Appearance Ur Clear Clear   Leukocytes,UA Negative Negative   Protein,UA Negative Negative/Trace   Glucose, UA Negative Negative   Ketones, UA Negative Negative   RBC, UA Trace (A) Negative   Bilirubin, UA Negative Negative   Urobilinogen, Ur 0.2 0.2 -  1.0 mg/dL   Nitrite, UA Negative Negative   Microscopic Examination See below:   Microalbumin, Urine Waived  Result Value Ref Range   Microalb, Ur Waived 10 0 - 19 mg/L   Creatinine, Urine Waived 50 10 - 300 mg/dL   Microalb/Creat Ratio <30 <30 mg/g      Assessment & Plan:   Problem List Items Addressed This Visit       Cardiovascular and Mediastinum   Hypertension    Under good control on current regimen. Continue current regimen. Continue to monitor. Call with  any concerns. Refills given. Labs drawn today.       Relevant Medications   amLODipine (NORVASC) 2.5 MG tablet   rosuvastatin (CRESTOR) 20 MG tablet   Other Relevant Orders   Comprehensive metabolic panel   CBC with Differential/Platelet   Urinalysis, Routine w reflex microscopic (Completed)   Microalbumin, Urine Waived (Completed)     Endocrine   Thyroid disorder    Rechecking labs today. Await results. Treat as needed.       Relevant Orders   Comprehensive metabolic panel   CBC with Differential/Platelet   TSH     Other   Hyperlipidemia    Under good control on current regimen. Continue current regimen. Continue to monitor. Call with any concerns. Refills given. Labs drawn today.      Relevant Medications   amLODipine (NORVASC) 2.5 MG tablet   rosuvastatin (CRESTOR) 20 MG tablet   Other Relevant Orders   Comprehensive metabolic panel   CBC with Differential/Platelet   Lipid Panel w/o Chol/HDL Ratio   Other Visit Diagnoses     Routine general medical examination at a health care facility    -  Primary   Vaccines up to date. Screening labs checked today. Colonoscopy up to date. Continue diet and exercise. Call with any concerns. Continue to monitor.    Hesitancy       Labs drawn today. Await results. Treat as needed.    Relevant Orders   PSA   Blurred vision       Will get him back into opthamology. Call with any concerns.    Relevant Orders   Ambulatory referral to Ophthalmology        LABORATORY TESTING:  Health maintenance labs ordered today as discussed above.   The natural history of prostate cancer and ongoing controversy regarding screening and potential treatment outcomes of prostate cancer has been discussed with the patient. The meaning of a false positive PSA and a false negative PSA has been discussed. He indicates understanding of the limitations of this screening test and wishes to proceed with screening PSA testing.   IMMUNIZATIONS:   - Tdap:  Tetanus vaccination status reviewed: last tetanus booster within 10 years. - Influenza: Refused - Pneumovax: Up to date - Prevnar: Up to date - COVID: Refused - HPV: Not applicable - Shingrix vaccine: Refused  SCREENING: - Colonoscopy: Up to date  Discussed with patient purpose of the colonoscopy is to detect colon cancer at curable precancerous or early stages   PATIENT COUNSELING:    Sexuality: Discussed sexually transmitted diseases, partner selection, use of condoms, avoidance of unintended pregnancy  and contraceptive alternatives.   Advised to avoid cigarette smoking.  I discussed with the patient that most people either abstain from alcohol or drink within safe limits (<=14/week and <=4 drinks/occasion for males, <=7/weeks and <= 3 drinks/occasion for females) and that the risk for alcohol disorders and other health effects rises proportionally with the  number of drinks per week and how often a drinker exceeds daily limits.  Discussed cessation/primary prevention of drug use and availability of treatment for abuse.   Diet: Encouraged to adjust caloric intake to maintain  or achieve ideal body weight, to reduce intake of dietary saturated fat and total fat, to limit sodium intake by avoiding high sodium foods and not adding table salt, and to maintain adequate dietary potassium and calcium preferably from fresh fruits, vegetables, and low-fat dairy products.    stressed the importance of regular exercise  Injury prevention: Discussed safety belts, safety helmets, smoke detector, smoking near bedding or upholstery.   Dental health: Discussed importance of regular tooth brushing, flossing, and dental visits.   Follow up plan: NEXT PREVENTATIVE PHYSICAL DUE IN 1 YEAR. No follow-ups on file.

## 2021-11-11 ENCOUNTER — Encounter: Payer: Self-pay | Admitting: Family Medicine

## 2021-11-11 LAB — CBC WITH DIFFERENTIAL/PLATELET
Basophils Absolute: 0.1 10*3/uL (ref 0.0–0.2)
Basos: 1 %
EOS (ABSOLUTE): 0.3 10*3/uL (ref 0.0–0.4)
Eos: 5 %
Hematocrit: 32.2 % — ABNORMAL LOW (ref 37.5–51.0)
Hemoglobin: 11.2 g/dL — ABNORMAL LOW (ref 13.0–17.7)
Immature Grans (Abs): 0 10*3/uL (ref 0.0–0.1)
Immature Granulocytes: 0 %
Lymphocytes Absolute: 2.1 10*3/uL (ref 0.7–3.1)
Lymphs: 35 %
MCH: 28.6 pg (ref 26.6–33.0)
MCHC: 34.8 g/dL (ref 31.5–35.7)
MCV: 82 fL (ref 79–97)
Monocytes Absolute: 0.5 10*3/uL (ref 0.1–0.9)
Monocytes: 8 %
Neutrophils Absolute: 3 10*3/uL (ref 1.4–7.0)
Neutrophils: 51 %
Platelets: 277 10*3/uL (ref 150–450)
RBC: 3.92 x10E6/uL — ABNORMAL LOW (ref 4.14–5.80)
RDW: 12.9 % (ref 11.6–15.4)
WBC: 5.8 10*3/uL (ref 3.4–10.8)

## 2021-11-11 LAB — COMPREHENSIVE METABOLIC PANEL
ALT: 11 IU/L (ref 0–44)
AST: 22 IU/L (ref 0–40)
Albumin/Globulin Ratio: 2 (ref 1.2–2.2)
Albumin: 4.4 g/dL (ref 3.8–4.8)
Alkaline Phosphatase: 74 IU/L (ref 44–121)
BUN/Creatinine Ratio: 10 (ref 10–24)
BUN: 8 mg/dL (ref 8–27)
Bilirubin Total: 0.4 mg/dL (ref 0.0–1.2)
CO2: 26 mmol/L (ref 20–29)
Calcium: 9.5 mg/dL (ref 8.6–10.2)
Chloride: 99 mmol/L (ref 96–106)
Creatinine, Ser: 0.78 mg/dL (ref 0.76–1.27)
Globulin, Total: 2.2 g/dL (ref 1.5–4.5)
Glucose: 113 mg/dL — ABNORMAL HIGH (ref 70–99)
Potassium: 3.8 mmol/L (ref 3.5–5.2)
Sodium: 138 mmol/L (ref 134–144)
Total Protein: 6.6 g/dL (ref 6.0–8.5)
eGFR: 98 mL/min/{1.73_m2} (ref >59)

## 2021-11-11 LAB — LIPID PANEL W/O CHOL/HDL RATIO
Cholesterol, Total: 123 mg/dL (ref 100–199)
HDL: 39 mg/dL — ABNORMAL LOW (ref >39)
LDL Chol Calc (NIH): 57 mg/dL (ref 0–99)
Triglycerides: 159 mg/dL — ABNORMAL HIGH (ref 0–149)
VLDL Cholesterol Cal: 27 mg/dL (ref 5–40)

## 2021-11-11 LAB — PSA: Prostate Specific Ag, Serum: 0.3 ng/mL (ref 0.0–4.0)

## 2021-11-11 LAB — TSH: TSH: 0.338 u[IU]/mL — ABNORMAL LOW (ref 0.450–4.500)

## 2021-11-16 ENCOUNTER — Telehealth: Payer: Self-pay | Admitting: Family Medicine

## 2021-11-16 NOTE — Telephone Encounter (Signed)
Patient dropped off handicap placard application for provider to complete.  Patient asked to be texted/called at [phone] when complete.  Placed in provider's folder.

## 2021-11-16 NOTE — Telephone Encounter (Signed)
Forms filled out.

## 2021-11-17 NOTE — Telephone Encounter (Signed)
Attempted to contact patient no answer unable to leave VM.  Will place form in Completed Bin for patient pick-up.

## 2022-01-13 DIAGNOSIS — H40001 Preglaucoma, unspecified, right eye: Secondary | ICD-10-CM | POA: Diagnosis not present

## 2022-01-30 IMAGING — CT CT NECK W/ CM
3 of 4 series · 11 of 33 positions shown, 13 images · IV contrast (omnipaque)
Comparison: CT neck 08/18/2020

CLINICAL DATA: Stridor and short of breath. Dysphagia. Rule out
epiglottitis or tonsillitis.

EXAM:
CT NECK WITH CONTRAST
TECHNIQUE: Multidetector CT imaging of the neck was performed using the
standard protocol following the bolus administration of intravenous
contrast.
CONTRAST:  75mL OMNIPAQUE IOHEXOL 300 MG/ML  SOLN

[Series 5: sag neck · sagittal · 0.48mm/px · 5 of 168 slices shown, 6 images]
[im 56/168  bone]
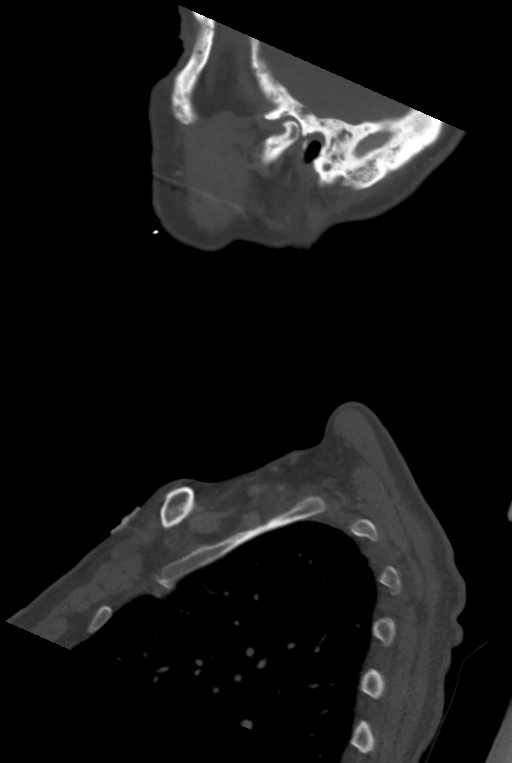
[im 70/168  bone]
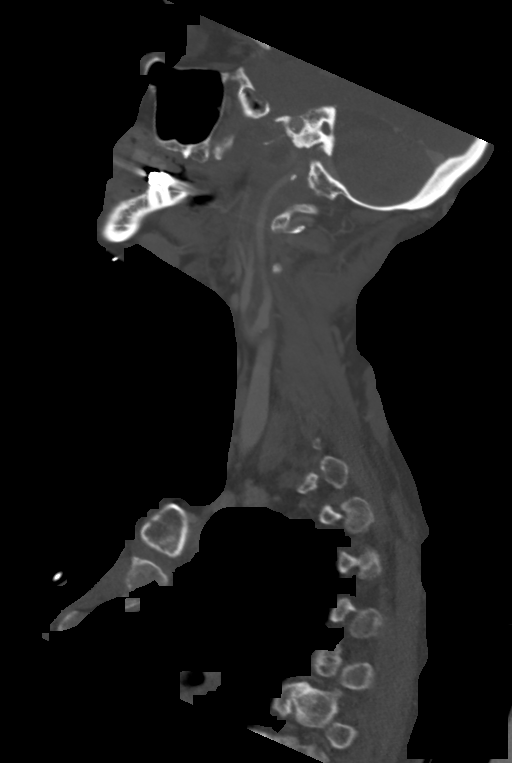
[im 84/168  soft-tissue]
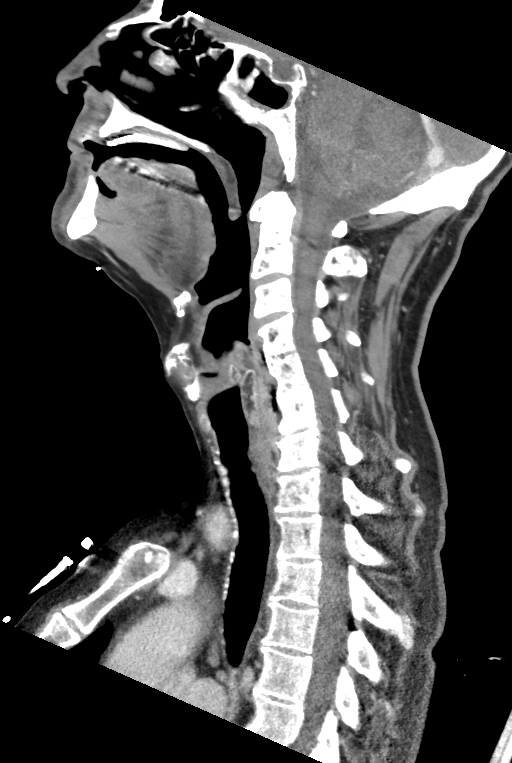
[im 84/168  bone]
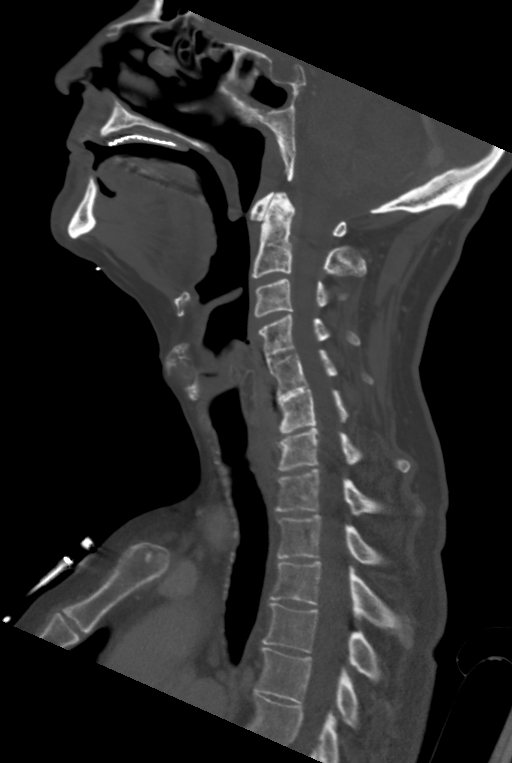
[im 98/168  bone]
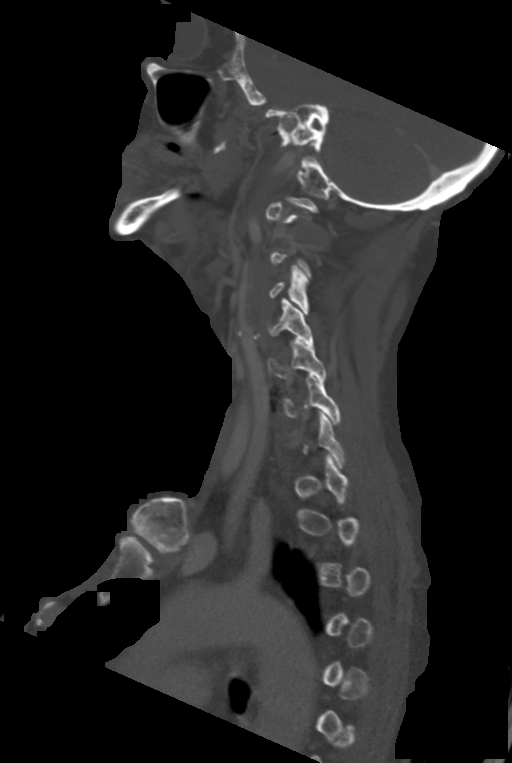
[im 112/168  bone]
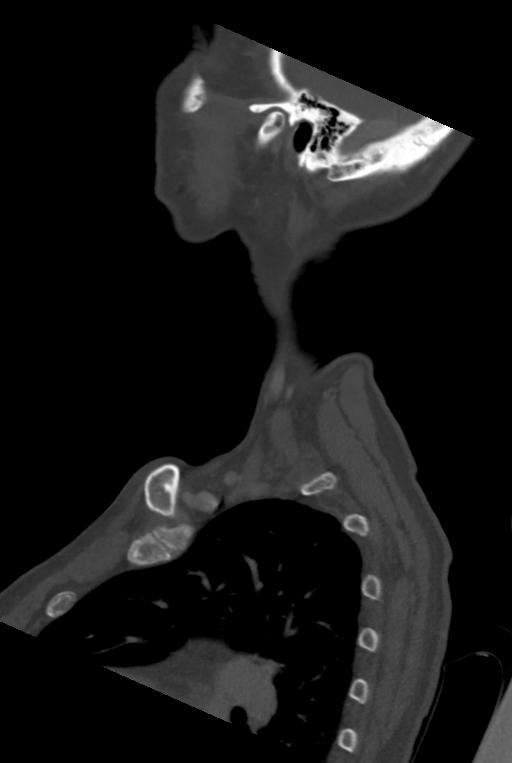

[Series 6: cor neck · coronal · 0.67mm/px · 3 of 115 slices shown]
[im 37/115  bone]
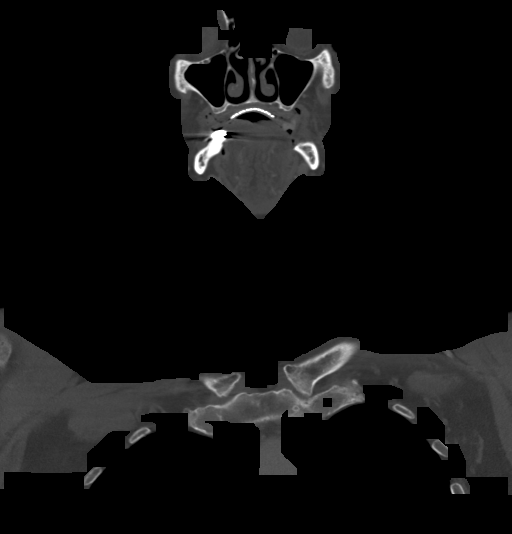
[im 51/115  bone]
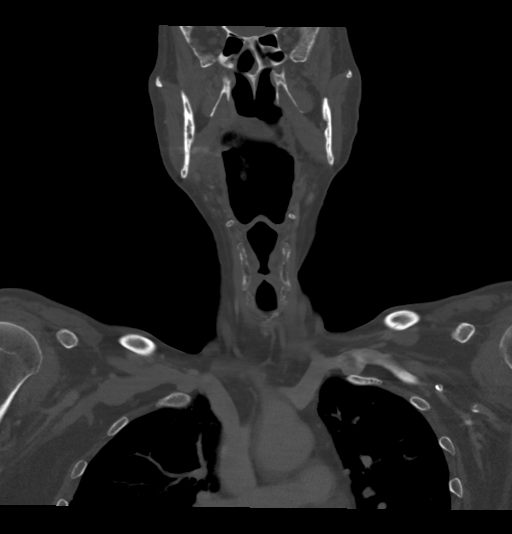
[im 65/115  bone]
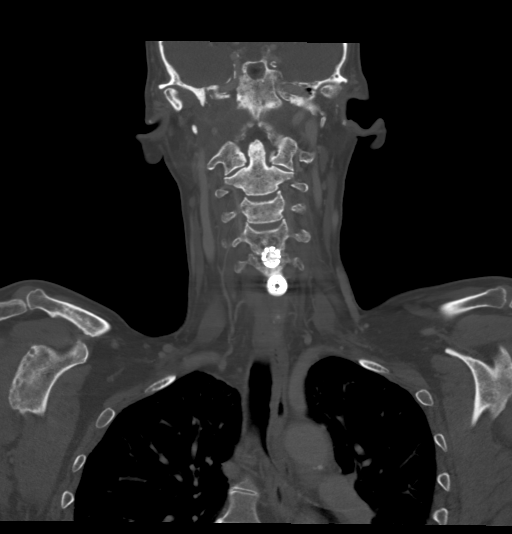

[Series 7: orthogonal ax · axial · 0.47mm/px · z∈[+80,+282]mm · 3 of 186 slices shown, 4 images]
[im 53/186  soft-tissue]
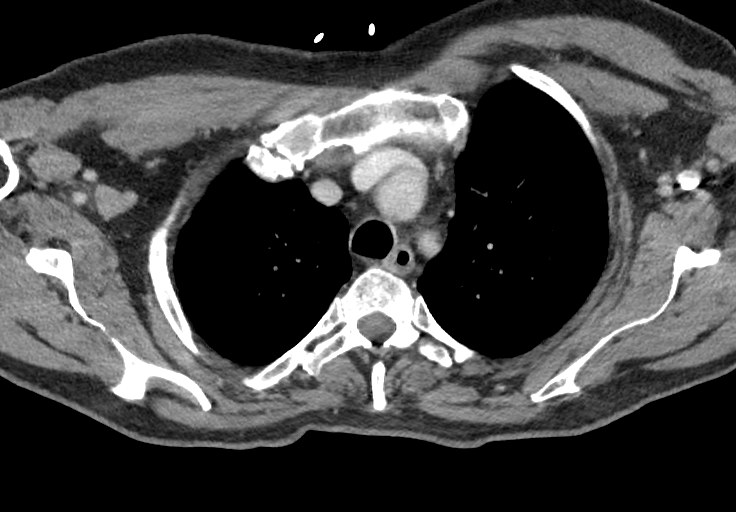
[im 53/186  bone]
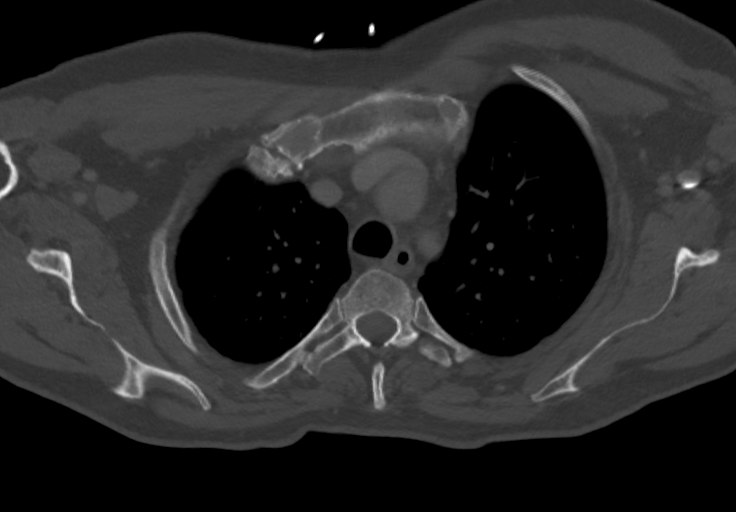
[im 106/186  bone]
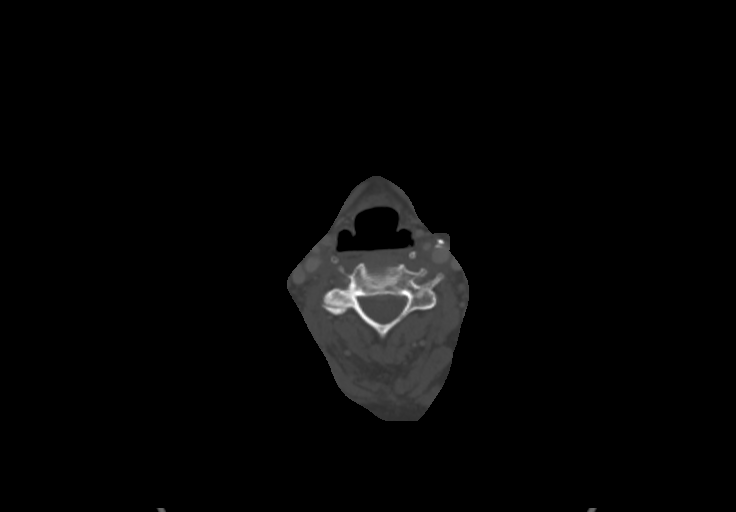
[im 159/186  bone]
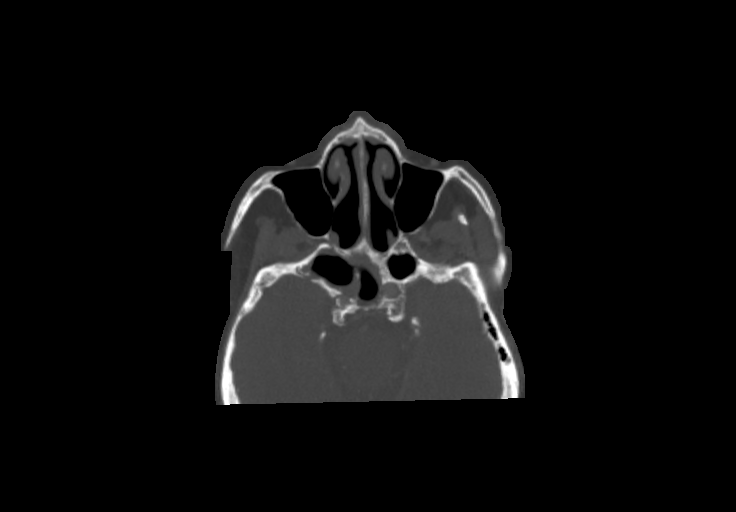

[11 of 33 positions shown; findings below may reference images not displayed]

FINDINGS: Pharynx and larynx: Mass in the right posterior nasopharynx again
noted and unchanged. Central low density necrosis present. Mass is
difficult to measure due to ill-defined margins but is approximately
2.5 cm in diameter. There is extension to the skull base on the
right. There is posterior extension into the soft tissues around the
right carotid and jugular vein without occlusion.

Normal airway. Epiglottis and larynx normal. No parapharyngeal
abscess.

Salivary glands: Atrophic parotid and submandibular glands without
mass or edema

Thyroid: Small thyroid without focal abnormality

Lymph nodes: Right level 1 lymph node 11 mm in short axis dimension.
Left level 1 lymph node 8.7 mm in short axis dimension. No change
from the prior study. No other significant lymph nodes in the neck.

Vascular: Tumor infiltration around the right internal carotid
artery right jugular vein below the skull base without vascular
occlusion. Atherosclerotic calcification in the carotid bifurcation
bilaterally.

Limited intracranial: Negative

Visualized orbits: Negative

Mastoids and visualized paranasal sinuses: Prior sinus surgery on
the right. Mucosal edema in the sphenoid sinus bilaterally right
greater than left.

Right mastoid and middle ear effusion consistent with obstruction of
the eustachian tube. This is unchanged from the prior study.

Skeleton: Fusion.  Interbody fusion C4-5 and C5-6 with solid

Nasopharyngeal tumor extends to the right skull base. No definite
bone destruction.

Upper chest: Lung apices clear bilaterally.

Other: None
IMPRESSION: Nasopharyngeal mass unchanged from recent CT neck. This is most
compatible with squamous cell carcinoma. There is infiltration
around the right jugular vein and right carotid artery without
vascular occlusion. Right mastoid effusion and right middle ear
effusion due to occlusion of the eustachian tube.

11 mm right level 1 lymph node and 8.7 mm left level 1 lymph node.
No other significant adenopathy.

## 2022-02-16 DIAGNOSIS — H2513 Age-related nuclear cataract, bilateral: Secondary | ICD-10-CM | POA: Diagnosis not present

## 2022-02-16 DIAGNOSIS — H353131 Nonexudative age-related macular degeneration, bilateral, early dry stage: Secondary | ICD-10-CM | POA: Diagnosis not present

## 2022-02-16 DIAGNOSIS — H353221 Exudative age-related macular degeneration, left eye, with active choroidal neovascularization: Secondary | ICD-10-CM | POA: Diagnosis not present

## 2022-03-18 ENCOUNTER — Other Ambulatory Visit: Payer: Self-pay | Admitting: Family Medicine

## 2022-03-18 ENCOUNTER — Telehealth: Payer: Self-pay | Admitting: Family Medicine

## 2022-03-18 NOTE — Telephone Encounter (Signed)
Copied from [place] [id number]. Topic: Medicare AWV >> Mar 18, 2022 11:31 AM Josephina Gip wrote: Reason for CRM:  N/A unable to leave a message for patient to call back and schedule the Medicare Annual Wellness Visit (AWV) virtually or by telephone.  Last AWV 06/03/20  Please schedule at anytime with [facility] Health Advisor.  45 minute appointment  Any questions, please call me at [phone]

## 2022-03-18 NOTE — Telephone Encounter (Signed)
Requested Prescriptions  Pending Prescriptions Disp Refills  . levothyroxine (SYNTHROID) 50 MCG tablet [Pharmacy Med Name: LEVOTHYROXINE 50 MCG TABLET] 90 tablet 0    Sig: TAKE 1 TABLET BY MOUTH EVERY DAY     Endocrinology:  Hypothyroid Agents Failed - 03/18/2022  2:51 AM      Failed - TSH in normal range and within 360 days    TSH  Date Value Ref Range Status  11/10/2021 0.338 (L) 0.450 - 4.500 uIU/mL Final         Passed - Valid encounter within last 12 months    Recent Outpatient Visits          4 months ago Routine general medical examination at a health care facility   [facility], Megan P, DO   6 months ago Sore throat   [place], Lauren A, NP   7 months ago Cellulitis of right ear   [place], Megan P, DO   7 months ago Cellulitis of right ear   [place], Megan P, DO   10 months ago Squamous cell carcinoma of skin of right upper extremity, including shoulder   [place], Barb Merino, DO      Future Appointments            In 1 month [place], Barb Merino, DO [place]

## 2022-03-23 DIAGNOSIS — H353221 Exudative age-related macular degeneration, left eye, with active choroidal neovascularization: Secondary | ICD-10-CM | POA: Diagnosis not present

## 2022-04-19 DIAGNOSIS — H353221 Exudative age-related macular degeneration, left eye, with active choroidal neovascularization: Secondary | ICD-10-CM | POA: Diagnosis not present

## 2022-04-22 ENCOUNTER — Ambulatory Visit
Admission: RE | Admit: 2022-04-22 | Discharge: 2022-04-22 | Disposition: A | Discharge status: Home / Self Care | Payer: Medicare Other | Source: Ambulatory Visit | Attending: Oncology | Admitting: Oncology

## 2022-04-22 DIAGNOSIS — C760 Malignant neoplasm of head, face and neck: Secondary | ICD-10-CM | POA: Diagnosis not present

## 2022-04-22 DIAGNOSIS — J392 Other diseases of pharynx: Secondary | ICD-10-CM | POA: Diagnosis not present

## 2022-04-22 LAB — POCT I-STAT CREATININE: Creatinine, Ser: 1 mg/dL (ref 0.61–1.24)

## 2022-04-22 MED ORDER — IOHEXOL 300 MG/ML  SOLN
75.0000 mL | Freq: Once | INTRAMUSCULAR | Status: AC | PRN
  Administered 2022-04-22: 75 mL via INTRAVENOUS

## 2022-04-26 ENCOUNTER — Encounter: Payer: Self-pay | Admitting: Nurse Practitioner

## 2022-04-26 ENCOUNTER — Inpatient Hospital Stay: Payer: Medicare Other | Attending: Nurse Practitioner | Admitting: Nurse Practitioner

## 2022-04-26 VITALS — BP 121/83 | HR 71 | Temp 97.1°F | Resp 100 | Ht 72.0 in | Wt 156.8 lb

## 2022-04-26 DIAGNOSIS — J392 Other diseases of pharynx: Secondary | ICD-10-CM

## 2022-04-26 DIAGNOSIS — Z87891 Personal history of nicotine dependence: Secondary | ICD-10-CM | POA: Insufficient documentation

## 2022-04-26 DIAGNOSIS — E785 Hyperlipidemia, unspecified: Secondary | ICD-10-CM | POA: Diagnosis not present

## 2022-04-26 DIAGNOSIS — Z79899 Other long term (current) drug therapy: Secondary | ICD-10-CM | POA: Diagnosis not present

## 2022-04-26 DIAGNOSIS — E039 Hypothyroidism, unspecified: Secondary | ICD-10-CM | POA: Insufficient documentation

## 2022-04-26 DIAGNOSIS — R599 Enlarged lymph nodes, unspecified: Secondary | ICD-10-CM | POA: Insufficient documentation

## 2022-04-26 DIAGNOSIS — Z85828 Personal history of other malignant neoplasm of skin: Secondary | ICD-10-CM | POA: Insufficient documentation

## 2022-04-26 DIAGNOSIS — I10 Essential (primary) hypertension: Secondary | ICD-10-CM | POA: Insufficient documentation

## 2022-04-26 NOTE — Progress Notes (Signed)
[place]  [phone] [phone]) [phone]  ID: Marcus Wright OB: 09/17/1956  MR#: [id number]  FVC#:[id number]  Patient Care Team: Valerie Roys, DO as PCP - General (Family Medicine) Clyde Canterbury, MD as Referring Physician (Otolaryngology)  CHIEF COMPLAINT: Right nasopharyngeal mass.  INTERVAL HISTORY: Patient returns to clinic today for further evaluation and discussion of his imaging results. History provided primarily by patient's granddaughter. He reports feeling well and remains at baseline. No pain or dysphagia. Weight is essentially stable along with appetite. No neurologic complaints, fevers, illness. He has no chest pain, shortness of breath, cough, or hemoptysis.  Patient denies any nausea, vomiting, constipation, or diarrhea.  He has no urinary complaints.  Patient offers no specific complaints today.  REVIEW OF SYSTEMS:   Review of Systems  Constitutional: Negative.  Negative for fever, malaise/fatigue and weight loss.  Respiratory: Negative.  Negative for cough, hemoptysis and shortness of breath.   Cardiovascular: Negative.  Negative for chest pain and leg swelling.  Gastrointestinal: Negative.  Negative for abdominal pain.  Genitourinary: Negative.  Negative for dysuria.  Musculoskeletal: Negative.  Negative for neck pain.  Skin: Negative.  Negative for rash.  Neurological: Negative.  Negative for dizziness, focal weakness, weakness and headaches.  Psychiatric/Behavioral: Negative.  The patient is not nervous/anxious.   As per HPI. Otherwise, a complete review of systems is negative.  PAST MEDICAL HISTORY: Past Medical History:  Diagnosis Date   Cancer [facility]) 2003   skin'   Hyperlipidemia    Hypertension    Hypothyroidism    Thyroid disorder    Wears dentures    full upper   Wears hearing aid     PAST SURGICAL HISTORY: Past Surgical History:  Procedure Laterality Date   BACK SURGERY  2011   BREAST SURGERY Right  11-22-12   CHOLECYSTECTOMY     COLONOSCOPY     COLONOSCOPY WITH PROPOFOL N/A 07/19/2016   Procedure: COLONOSCOPY WITH PROPOFOL;  Surgeon: Lucilla Lame, MD;  Location: [place];  Service: Endoscopy;  Laterality: N/A;   REPLACEMENT TOTAL KNEE Right 1993   SINUS ENDO W/FUSION Right 09/03/2020   Procedure: Removal of Right nasopharyngeal mass, Biopsy and frozen sections;  Surgeon: Jason Coop, DO;  Location: [facility];  Service: ENT;  Laterality: Right;   SINUS SURGERY WITH INSTATRAK  2003   cancer    FAMILY HISTORY: History reviewed. No pertinent family history.  ADVANCED DIRECTIVES (Y/N):  N  HEALTH MAINTENANCE: Social History   Tobacco Use   Smoking status: Former   Smokeless tobacco: Never   Tobacco comments:    quit 20+ yrs ago  Vaping Use   Vaping Use: Never used  Substance Use Topics   Alcohol use: Yes    Comment: on occasion, 1-2 beers or wine occ.    Drug use: No     Colonoscopy:  PAP:  Bone density:  Lipid panel:  Allergies  Allergen Reactions   Doxazosin Hives    Unknown     Current Outpatient Medications  Medication Sig Dispense Refill   albuterol (VENTOLIN HFA) 108 (90 Base) MCG/ACT inhaler Inhale 2 puffs into the lungs every 4 (four) hours as needed for wheezing or shortness of breath. 1 each 6   amLODipine (NORVASC) 2.5 MG tablet Take 1 tablet (2.5 mg total) by mouth daily. 90 tablet 1   fluticasone (FLONASE) 50 MCG/ACT nasal spray SPRAY 2 SPRAYS INTO EACH NOSTRIL EVERY DAY 48 mL 4   glycerin adult 2 g suppository  Place 1 suppository rectally as needed for constipation. 12 suppository 12   levothyroxine (SYNTHROID) 50 MCG tablet TAKE 1 TABLET BY MOUTH EVERY DAY 90 tablet 0   ofloxacin (OCUFLOX) 0.3 % ophthalmic solution SMARTSIG:4 Drop(s) In Ear(s)     omeprazole (PRILOSEC) 20 MG capsule TAKE 1 CAPSULE BY MOUTH EVERY DAY 90 capsule 3   rosuvastatin (CRESTOR) 20 MG tablet Take 1 tablet (20 mg total) by mouth daily. 90 tablet 1   No current  facility-administered medications for this visit.    OBJECTIVE: Vitals:   04/26/22 1109  BP: 121/83  Pulse: 71  Resp: (!) 100  Temp: (!) 97.1 F (36.2 C)      Body mass index is 21.27 kg/m.    ECOG FS:1 - Symptomatic but completely ambulatory  General: Well-developed, well-nourished, no acute distress. Eyes: Pink conjunctiva, anicteric sclera. HEENT: hard of hearing despite hearing aid.  Lungs: Clear to auscultation bilaterally.  No audible wheezing or coughing Heart: Regular rate and rhythm.  Abdomen: Soft, nontender, nondistended.  Musculoskeletal: No edema, cyanosis, or clubbing. Neuro: Alert, answering all questions appropriately. Cranial nerves grossly intact. Skin: No rashes or petechiae noted. Psych: Normal affect.   LAB RESULTS:  Lab Results  Component Value Date   NA 138 11/10/2021   K 3.8 11/10/2021   CL 99 11/10/2021   CO2 26 11/10/2021   GLUCOSE 113 (H) 11/10/2021   BUN 8 11/10/2021   CREATININE 1.00 04/22/2022   CALCIUM 9.5 11/10/2021   PROT 6.6 11/10/2021   ALBUMIN 4.4 11/10/2021   AST 22 11/10/2021   ALT 11 11/10/2021   ALKPHOS 74 11/10/2021   BILITOT 0.4 11/10/2021   GFRNONAA >60 12/19/2020   GFRAA 99 06/03/2020    Lab Results  Component Value Date   WBC 5.8 11/10/2021   NEUTROABS 3.0 11/10/2021   HGB 11.2 (L) 11/10/2021   HCT 32.2 (L) 11/10/2021   MCV 82 11/10/2021   PLT 277 11/10/2021     STUDIES: CT SOFT TISSUE NECK W CONTRAST  Result Date: 04/22/2022 CLINICAL DATA:  Head neck cancer surveillance; 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 RADIATION DOSE REDUCTION: This exam was performed according to the departmental dose-optimization program which includes automated exposure control, adjustment of the mA and/or kV according  to patient size and/or use of iterative reconstruction technique. CONTRAST:  21m OMNIPAQUE IOHEXOL 300 MG/ML  SOLN COMPARISON:  10/23/2021 FINDINGS: Pharynx and larynx: Unchanged asymmetric fullness of the right nasopharynx with soft tissue mass extending into the right parapharyngeal and carotid spaces and encasing the right carotid artery, similar to prior. The lower pharynx, epiglottis, and larynx are normal. No retropharyngeal abnormality. Salivary glands: No inflammation, mass, or stone.  Atrophic. Thyroid: Normal. Lymph nodes: Redemonstrated enlarged level 1 B lymph nodes, measuring up to 12 mm on the right and 8 mm on the left, unchanged. Vascular: Severe narrowing in the proximal right common carotid artery, secondary to plaque. Redemonstrated encasement of the distal right internal carotid artery which remains patent. Limited intracranial: Negative. Visualized orbits: Negative. Mastoids and visualized paranasal sinuses: Unchanged opacification of the right mastoid and middle ear. Redemonstrated mucosal thickening in the right sphenoid sinus. Skeleton: No acute osseous abnormality. Upper chest: No focal pulmonary opacity or pleural effusion. Other: None. IMPRESSION: Unchanged asymmetric fullness in the right nasopharynx, with soft tissue extending into the  right parapharyngeal and carotid spaces and encasing the right internal carotid artery, and enlarged level 1B lymph nodes. Electronically Signed   By: Merilyn Baba M.D.   On: 04/22/2022 12:53     ASSESSMENT: Right nasopharyngeal mass.  PLAN:    1. Right nasopharyngeal mass: Patient underwent chemotherapy and XRT more than 20 years ago for  squamous cell carcinoma in the same anatomic area.  Previously, he was evaluated by ENT and underwent endoscopy which did not reveal anything that could be biopsied. PET scan results from January 09, 2021 reviewed independently with hypermetabolic mass highly suspicious for recurrence.  Since no biopsy could be  performed, it was decided upon to proceed with active surveillance.  His most recent imaging with CT scan on July 27th, 2023 reviewed independently and reported as above with unchanged appearance of right nasopharynx lesions.  No intervention is needed at this time.  Given that imaging has remained unchanged and he is asymptomatic he can be discharged from clinic and continue follow up with his PCP. Should he develop symptoms, recommend CT Soft tissue neck w contrast. He can return to [facility] as needed.   Patient expressed understanding and was in agreement with this plan. He also understands that He can call clinic at any time with any questions, concerns, or complaints.   Verlon Au, NP   04/26/2022 11:40 AM  CC: Dr. Grayland Ormond

## 2022-05-07 ENCOUNTER — Telehealth: Payer: Self-pay | Admitting: Family Medicine

## 2022-05-07 NOTE — Telephone Encounter (Signed)
Copied from [place] [id number]. Topic: Medicare AWV >> May 07, 2022  2:04 PM Josephina Gip wrote: Reason for CRM:  N/A unable to leave a message for patient to call back and schedule the Medicare Annual Wellness Visit (AWV) virtually or by telephone.  Last AWV 06/03/20  Please schedule at anytime with [facility] Health Advisor.   Any questions, please call me at [phone]

## 2022-05-10 ENCOUNTER — Ambulatory Visit (INDEPENDENT_AMBULATORY_CARE_PROVIDER_SITE_OTHER): Payer: Medicare Other | Admitting: Family Medicine

## 2022-05-10 ENCOUNTER — Encounter: Payer: Self-pay | Admitting: Family Medicine

## 2022-05-10 VITALS — BP 103/64 | HR 73 | Temp 97.9°F | Wt 154.0 lb

## 2022-05-10 DIAGNOSIS — Z0000 Encounter for general adult medical examination without abnormal findings: Secondary | ICD-10-CM

## 2022-05-10 DIAGNOSIS — E782 Mixed hyperlipidemia: Secondary | ICD-10-CM | POA: Diagnosis not present

## 2022-05-10 DIAGNOSIS — E079 Disorder of thyroid, unspecified: Secondary | ICD-10-CM

## 2022-05-10 DIAGNOSIS — R3911 Hesitancy of micturition: Secondary | ICD-10-CM | POA: Diagnosis not present

## 2022-05-10 DIAGNOSIS — I10 Essential (primary) hypertension: Secondary | ICD-10-CM

## 2022-05-10 DIAGNOSIS — Z789 Other specified health status: Secondary | ICD-10-CM | POA: Diagnosis not present

## 2022-05-10 LAB — URINALYSIS, ROUTINE W REFLEX MICROSCOPIC
Bilirubin, UA: NEGATIVE
Glucose, UA: NEGATIVE
Ketones, UA: NEGATIVE
Leukocytes,UA: NEGATIVE
Nitrite, UA: NEGATIVE
Protein,UA: NEGATIVE
RBC, UA: NEGATIVE
Specific Gravity, UA: 1.015 (ref 1.005–1.030)
Urobilinogen, Ur: 1 mg/dL (ref 0.2–1.0)
pH, UA: 7 (ref 5.0–7.5)

## 2022-05-10 LAB — MICROALBUMIN, URINE WAIVED
Creatinine, Urine Waived: 100 mg/dL (ref 10–300)
Microalb, Ur Waived: 30 mg/L — ABNORMAL HIGH (ref 0–19)
Microalb/Creat Ratio: <30 mg/g (ref <30)

## 2022-05-10 MED ORDER — POLYETHYLENE GLYCOL 3350 17 GM/SCOOP PO POWD: 17.0000 g | Freq: Two times a day (BID) | ORAL | 1 refills | Status: DC | PRN

## 2022-05-10 MED ORDER — SUCRALFATE 1 G PO TABS: 1.0000 g | ORAL_TABLET | Freq: Three times a day (TID) | ORAL | 6 refills | Status: DC

## 2022-05-10 MED ORDER — ROSUVASTATIN CALCIUM 20 MG PO TABS
20.0000 mg | ORAL_TABLET | Freq: Every day | ORAL | 1 refills | Status: DC
Start: 2022-05-10 — End: 2022-12-21

## 2022-05-10 NOTE — Assessment & Plan Note (Signed)
Overtreated and getting dizzy- will stop his amlodipine and recheck 1 month. Call with any concerns.

## 2022-05-10 NOTE — Assessment & Plan Note (Signed)
Rechecking labs today. Await results. Treat as needed.  °

## 2022-05-10 NOTE — Assessment & Plan Note (Signed)
A voluntary discussion about advance care planning including the explanation and discussion of advance directives was extensively discussed  with the patient for 2 minutes with patient present.  Explanation about the health care proxy and Living will was reviewed and packet with forms with explanation of how to fill them out was given.  During this discussion, the patient was not able to identify a health care proxy and plans to fill out the paperwork required.  Patient was offered a separate [place] visit for further assistance with forms.

## 2022-05-10 NOTE — Progress Notes (Signed)
BP 103/64   Pulse 73   Temp 97.9 F (36.6 C)   Wt 154 lb (69.9 kg)   SpO2 98%   BMI 20.89 kg/m    Subjective:    Patient ID: Marcus Wright, male    DOB: [birth year], 66 y.o.   MRN: [id number]  HPI: Marcus Wright is a 66 y.o. male presenting on 05/10/2022 for comprehensive medical examination. Current medical complaints include:  HYPERTENSION / HYPERLIPIDEMIA Satisfied with current treatment? yes Duration of hypertension: chronic BP monitoring frequency: not checking BP medication side effects: yes- dizziness Past BP meds: amlodipine Duration of hyperlipidemia: chronic Cholesterol medication side effects: no Cholesterol supplements: none Past cholesterol medications: crestor Medication compliance: excellent compliance Aspirin: no Recent stressors: no Recurrent headaches: no Visual changes: no Palpitations: no Dyspnea: no Chest pain: no Lower extremity edema: no Dizzy/lightheaded: yes  HYPOTHYROIDISM Thyroid control status:controlled Satisfied with current treatment? no Medication side effects: no Medication compliance: excellent compliance Recent dose adjustment:no Fatigue: no Cold intolerance: no Heat intolerance: no Weight gain: no Weight loss: no Constipation: no Diarrhea/loose stools: no Palpitations: no Lower extremity edema: no Anxiety/depressed mood: no  He currently lives with: alone Interim Problems from his last visit: no  Functional Status Survey: Is the patient deaf or have difficulty hearing?: Yes Does the patient have difficulty seeing, even when wearing glasses/contacts?: No Does the patient have difficulty concentrating, remembering, or making decisions?: No Does the patient have difficulty walking or climbing stairs?: No Does the patient have difficulty dressing or bathing?: No Does the patient have difficulty doing errands alone such as visiting a doctor's office or shopping?: No  FALL RISK:    05/10/2022   11:25 AM  11/10/2021    1:32 PM 08/05/2021   10:06 AM 08/01/2020   10:35 AM 06/03/2020    9:00 AM  Fall Risk   Falls in the past year? 1 0 0 1 1  Number falls in past yr: 0 0 0 1 0  Injury with Fall? 0 0 0 0 0  Risk for fall due to : History of fall(s) No Fall Risks No Fall Risks Impaired balance/gait;History of fall(s)   Follow up Falls evaluation completed Falls evaluation completed Falls evaluation completed Falls evaluation completed     Depression Screen    05/10/2022   11:25 AM 11/10/2021    1:35 PM 08/05/2021   10:05 AM 08/01/2020   10:35 AM 06/03/2020    9:45 AM  Depression screen PHQ 2/9  Decreased Interest 0 0 0 0 0  Down, Depressed, Hopeless 0 0 0 0 0  PHQ - 2 Score 0 0 0 0 0  Altered sleeping  0 0 0 0  Tired, decreased energy  0 0 0 0  Change in appetite  0 0 0 0  Feeling bad or failure about yourself   0 0 0 0  Trouble concentrating  0 0 0 0  Moving slowly or fidgety/restless  0 0 0 0  Suicidal thoughts  0 0 0 0  PHQ-9 Score  0 0 0 0  Difficult doing work/chores   Not difficult at all Not difficult at all Not difficult at all    Advanced Directives Does not have one- information provided  Past Medical History:  Past Medical History:  Diagnosis Date   Cancer [facility]) 2003   skin'   Hyperlipidemia    Hypertension    Hypothyroidism    Thyroid disorder    Wears dentures    full upper  Wears hearing aid     Surgical History:  Past Surgical History:  Procedure Laterality Date   BACK SURGERY  2011   BREAST SURGERY Right 11-22-12   CHOLECYSTECTOMY     COLONOSCOPY     COLONOSCOPY WITH PROPOFOL N/A 07/19/2016   Procedure: COLONOSCOPY WITH PROPOFOL;  Surgeon: Lucilla Lame, MD;  Location: [facility];  Service: Endoscopy;  Laterality: N/A;   REPLACEMENT TOTAL KNEE Right 1993   SINUS ENDO W/FUSION Right 09/03/2020   Procedure: Removal of Right nasopharyngeal mass, Biopsy and frozen sections;  Surgeon: Jason Coop, DO;  Location: [facility];  Service: ENT;  Laterality:  Right;   SINUS SURGERY WITH INSTATRAK  2003   cancer    Medications:  Current Outpatient Medications on File Prior to Visit  Medication Sig   albuterol (VENTOLIN HFA) 108 (90 Base) MCG/ACT inhaler Inhale 2 puffs into the lungs every 4 (four) hours as needed for wheezing or shortness of breath.   fluticasone (FLONASE) 50 MCG/ACT nasal spray SPRAY 2 SPRAYS INTO EACH NOSTRIL EVERY DAY   glycerin adult 2 g suppository Place 1 suppository rectally as needed for constipation.   levothyroxine (SYNTHROID) 50 MCG tablet TAKE 1 TABLET BY MOUTH EVERY DAY   ofloxacin (OCUFLOX) 0.3 % ophthalmic solution SMARTSIG:4 Drop(s) In Ear(s)   omeprazole (PRILOSEC) 20 MG capsule TAKE 1 CAPSULE BY MOUTH EVERY DAY   No current facility-administered medications on file prior to visit.    Allergies:  Allergies  Allergen Reactions   Doxazosin Hives    Unknown     Social History:  Social History   Socioeconomic History   Marital status: Widowed    Spouse name: Not on file   Number of children: Not on file   Years of education: Not on file   Highest education level: Not on file  Occupational History   Not on file  Tobacco Use   Smoking status: Former   Smokeless tobacco: Never   Tobacco comments:    quit 20+ yrs ago  Vaping Use   Vaping Use: Never used  Substance and Sexual Activity   Alcohol use: Yes    Comment: on occasion, 1-2 beers or wine occ.    Drug use: No   Sexual activity: Not Currently  Other Topics Concern   Not on file  Social History Narrative   Not on file   Social Determinants of Health   Financial Resource Strain: Low Risk  (05/28/2019)   Overall Financial Resource Strain (CARDIA)    Difficulty of Paying Living Expenses: Not very hard  Food Insecurity: No Food Insecurity (05/28/2019)   Hunger Vital Sign    Worried About Running Out of Food in the Last Year: Never true    Ran Out of Food in the Last Year: Never true  Transportation Needs: No Transportation Needs  (05/28/2019)   PRAPARE - Hydrologist (Medical): No    Lack of Transportation (Non-Medical): No  Physical Activity: Not on file  Stress: Not on file  Social Connections: Not on file  Intimate Partner Violence: Not on file   Social History   Tobacco Use  Smoking Status Former  Smokeless Tobacco Never  Tobacco Comments   quit 20+ yrs ago   Social History   Substance and Sexual Activity  Alcohol Use Yes   Comment: on occasion, 1-2 beers or wine occ.     Family History:  History reviewed. No pertinent family history.  Past medical history, surgical  history, medications, allergies, family history and social history reviewed with patient today and changes made to appropriate areas of the chart.   Review of Systems  Constitutional: Negative.   HENT:  Positive for ear discharge and hearing loss. Negative for congestion, ear pain, nosebleeds, sinus pain, sore throat and tinnitus.   Eyes:  Positive for blurred vision. Negative for double vision, photophobia, pain, discharge and redness.       Eye infection, seeing eye doctor  Respiratory: Negative.  Negative for stridor.   Cardiovascular: Negative.   Gastrointestinal:  Positive for constipation and heartburn. Negative for abdominal pain, blood in stool, diarrhea, melena, nausea and vomiting.  Genitourinary: Negative.   Musculoskeletal:  Positive for back pain (better today). Negative for falls, joint pain, myalgias and neck pain.  Skin: Negative.   Neurological:  Positive for dizziness. Negative for tingling, tremors, sensory change, speech change, focal weakness, seizures, loss of consciousness, weakness and headaches.  Endo/Heme/Allergies: Negative.   Psychiatric/Behavioral: Negative.     All other ROS negative except what is listed above and in the HPI.      Objective:    BP 103/64   Pulse 73   Temp 97.9 F (36.6 C)   Wt 154 lb (69.9 kg)   SpO2 98%   BMI 20.89 kg/m   Wt Readings from Last 3  Encounters:  05/10/22 154 lb (69.9 kg)  04/26/22 156 lb 12.8 oz (71.1 kg)  11/10/21 161 lb 3.2 oz (73.1 kg)    No results found.  Physical Exam Vitals and nursing note reviewed.  Constitutional:      General: He is not in acute distress.    Appearance: Normal appearance. He is obese. He is not ill-appearing, toxic-appearing or diaphoretic.  HENT:     Head: Normocephalic and atraumatic.     Comments: Profoundly hard of hearing    Right Ear: Tympanic membrane, ear canal and external ear normal. There is no impacted cerumen.     Left Ear: Tympanic membrane, ear canal and external ear normal. There is no impacted cerumen.     Nose: Nose normal. No congestion or rhinorrhea.     Mouth/Throat:     Mouth: Mucous membranes are moist.     Pharynx: Oropharynx is clear. No oropharyngeal exudate or posterior oropharyngeal erythema.  Eyes:     General: No scleral icterus.       Right eye: No discharge.        Left eye: No discharge.     Extraocular Movements: Extraocular movements intact.     Conjunctiva/sclera: Conjunctivae normal.     Pupils: Pupils are equal, round, and reactive to light.  Neck:     Vascular: No carotid bruit.  Cardiovascular:     Rate and Rhythm: Normal rate and regular rhythm.     Pulses: Normal pulses.     Heart sounds: No murmur heard.    No friction rub. No gallop.  Pulmonary:     Effort: Pulmonary effort is normal. No respiratory distress.     Breath sounds: Normal breath sounds. No stridor. No wheezing, rhonchi or rales.  Chest:     Chest wall: No tenderness.  Abdominal:     General: Abdomen is flat. Bowel sounds are normal. There is no distension.     Palpations: Abdomen is soft. There is no mass.     Tenderness: There is no abdominal tenderness. There is no right CVA tenderness, left CVA tenderness, guarding or rebound.     Hernia: No  hernia is present.  Genitourinary:    Comments: Genital exam deferred with shared decision making Musculoskeletal:         General: No swelling, tenderness, deformity or signs of injury.     Cervical back: Normal range of motion and neck supple. No rigidity. No muscular tenderness.     Right lower leg: No edema.     Left lower leg: No edema.  Lymphadenopathy:     Cervical: No cervical adenopathy.  Skin:    General: Skin is warm and dry.     Capillary Refill: Capillary refill takes less than 2 seconds.     Coloration: Skin is not jaundiced or pale.     Findings: No bruising, erythema, lesion or rash.  Neurological:     General: No focal deficit present.     Mental Status: He is alert and oriented to person, place, and time.     Cranial Nerves: No cranial nerve deficit.     Sensory: No sensory deficit.     Motor: No weakness.     Coordination: Coordination normal.     Gait: Gait normal.     Deep Tendon Reflexes: Reflexes normal.  Psychiatric:        Mood and Affect: Mood normal.        Behavior: Behavior normal.        Thought Content: Thought content normal.        Judgment: Judgment normal.        05/10/2022   11:28 AM 06/03/2020    9:40 AM 05/22/2018    9:11 AM 12/20/2016   10:12 AM  6CIT Screen  What Year? 0 points 4 points 4 points 0 points  What month? 0 points 0 points 0 points 0 points  What time? 0 points 3 points 0 points 0 points  Count back from 20 0 points 0 points 0 points 0 points  Months in reverse 0 points 2 points 2 points 2 points  Repeat phrase 2 points 2 points 2 points 4 points  Total Score 2 points 11 points 8 points 6 points     Results for orders placed or performed in visit on 05/10/22  Urinalysis, Routine w reflex microscopic  Result Value Ref Range   Specific Gravity, UA 1.015 1.005 - 1.030   pH, UA 7.0 5.0 - 7.5   Color, UA Yellow Yellow   Appearance Ur Cloudy (A) Clear   Leukocytes,UA Negative Negative   Protein,UA Negative Negative/Trace   Glucose, UA Negative Negative   Ketones, UA Negative Negative   RBC, UA Negative Negative   Bilirubin, UA Negative  Negative   Urobilinogen, Ur 1.0 0.2 - 1.0 mg/dL   Nitrite, UA Negative Negative  Microalbumin, Urine Waived  Result Value Ref Range   Microalb, Ur Waived 30 (H) 0 - 19 mg/L   Creatinine, Urine Waived 100 10 - 300 mg/dL   Microalb/Creat Ratio <30 <30 mg/g      Assessment & Plan:   Problem List Items Addressed This Visit       Cardiovascular and Mediastinum   Hypertension    Overtreated and getting dizzy- will stop his amlodipine and recheck 1 month. Call with any concerns.       Relevant Medications   rosuvastatin (CRESTOR) 20 MG tablet   Other Relevant Orders   Comprehensive metabolic panel   CBC with Differential/Platelet   Urinalysis, Routine w reflex microscopic (Completed)   Microalbumin, Urine Waived (Completed)     Endocrine   Thyroid  disorder    Rechecking labs today. Await results. Treat as needed.       Relevant Orders   Comprehensive metabolic panel   CBC with Differential/Platelet   TSH     Other   Hyperlipidemia    Under good control on current regimen. Continue current regimen. Continue to monitor. Call with any concerns. Refills given. Labs drawn today.       Relevant Medications   rosuvastatin (CRESTOR) 20 MG tablet   Other Relevant Orders   Comprehensive metabolic panel   CBC with Differential/Platelet   Lipid Panel w/o Chol/HDL Ratio   Advance directive declined by patient    A voluntary discussion about advance care planning including the explanation and discussion of advance directives was extensively discussed  with the patient for 2 minutes with patient present.  Explanation about the health care proxy and Living will was reviewed and packet with forms with explanation of how to fill them out was given.  During this discussion, the patient was not able to identify a health care proxy and plans to fill out the paperwork required.  Patient was offered a separate [place] visit for further assistance with forms.         Other  Visit Diagnoses     Encounter for Medicare annual wellness exam    -  Primary   Preventative care discussed today as below.   Hesitancy       Labs drawn today. Await results.    Relevant Orders   PSA        Preventative Services:  Health Risk Assessment and Personalized Prevention Plan: Done today Bone Mass Measurements: N/A CVD Screening: Done Colon Cancer Screening: Up to date  Depression Screening: Done today Diabetes Screening: Done today Glaucoma Screening: See your eye doctor Hepatitis B vaccine: N/A Hepatitis C screening: Up to date HIV Screening: Up to date Flu Vaccine: Get in the Fall Lung cancer Screening: N/A Obesity Screening: Done today Pneumonia Vaccines (2): up to date STI Screening: N/A PSA screening: Done today  LABORATORY TESTING:  Health maintenance labs ordered today as discussed above.   The natural history of prostate cancer and ongoing controversy regarding screening and potential treatment outcomes of prostate cancer has been discussed with the patient. The meaning of a false positive PSA and a false negative PSA has been discussed. He indicates understanding of the limitations of this screening test and wishes to proceed with screening PSA testing.   IMMUNIZATIONS:   - Tdap: Tetanus vaccination status reviewed: last tetanus booster within 10 years. - Influenza: Postponed to flu season - Pneumovax: Up to date - Prevnar: Up to date - Zostavax vaccine: Given elsewhere  SCREENING: - Colonoscopy: Up to date  Discussed with patient purpose of the colonoscopy is to detect colon cancer at curable precancerous or early stages   PATIENT COUNSELING:    Sexuality: Discussed sexually transmitted diseases, partner selection, use of condoms, avoidance of unintended pregnancy  and contraceptive alternatives.   Advised to avoid cigarette smoking.  I discussed with the patient that most people either abstain from alcohol or drink within safe limits  (<=14/week and <=4 drinks/occasion for males, <=7/weeks and <= 3 drinks/occasion for females) and that the risk for alcohol disorders and other health effects rises proportionally with the number of drinks per week and how often a drinker exceeds daily limits.  Discussed cessation/primary prevention of drug use and availability of treatment for abuse.   Diet: Encouraged to adjust caloric intake to  maintain  or achieve ideal body weight, to reduce intake of dietary saturated fat and total fat, to limit sodium intake by avoiding high sodium foods and not adding table salt, and to maintain adequate dietary potassium and calcium preferably from fresh fruits, vegetables, and low-fat dairy products.    stressed the importance of regular exercise  Injury prevention: Discussed safety belts, safety helmets, smoke detector, smoking near bedding or upholstery.   Dental health: Discussed importance of regular tooth brushing, flossing, and dental visits.   Follow up plan: NEXT PREVENTATIVE PHYSICAL DUE IN 1 YEAR. Return in about 4 weeks (around 06/07/2022) for follow up BP.

## 2022-05-10 NOTE — Patient Instructions (Addendum)
STOP AMLODIPINE  Preventative Services:  Health Risk Assessment and Personalized Prevention Plan: Done today Bone Mass Measurements: N/A CVD Screening: Done Colon Cancer Screening: Up to date  Depression Screening: Done today Diabetes Screening: Done today Glaucoma Screening: See your eye doctor Hepatitis B vaccine: N/A Hepatitis C screening: Up to date HIV Screening: Up to date Flu Vaccine: Get in the Fall Lung cancer Screening: N/A Obesity Screening: Done today Pneumonia Vaccines (2): up to date STI Screening: N/A PSA screening: Done today

## 2022-05-10 NOTE — Assessment & Plan Note (Signed)
Under good control on current regimen. Continue current regimen. Continue to monitor. Call with any concerns. Refills given. Labs drawn today.   

## 2022-05-11 LAB — LIPID PANEL W/O CHOL/HDL RATIO
Cholesterol, Total: 130 mg/dL (ref 100–199)
HDL: 43 mg/dL (ref >39)
LDL Chol Calc (NIH): 67 mg/dL (ref 0–99)
Triglycerides: 108 mg/dL (ref 0–149)
VLDL Cholesterol Cal: 20 mg/dL (ref 5–40)

## 2022-05-11 LAB — CBC WITH DIFFERENTIAL/PLATELET
Basophils Absolute: 0.1 10*3/uL (ref 0.0–0.2)
Basos: 1 %
EOS (ABSOLUTE): 0.2 10*3/uL (ref 0.0–0.4)
Eos: 4 %
Hematocrit: 35.7 % — ABNORMAL LOW (ref 37.5–51.0)
Hemoglobin: 12 g/dL — ABNORMAL LOW (ref 13.0–17.7)
Immature Grans (Abs): 0 10*3/uL (ref 0.0–0.1)
Immature Granulocytes: 0 %
Lymphocytes Absolute: 2.4 10*3/uL (ref 0.7–3.1)
Lymphs: 43 %
MCH: 28.2 pg (ref 26.6–33.0)
MCHC: 33.6 g/dL (ref 31.5–35.7)
MCV: 84 fL (ref 79–97)
Monocytes Absolute: 0.5 10*3/uL (ref 0.1–0.9)
Monocytes: 9 %
Neutrophils Absolute: 2.4 10*3/uL (ref 1.4–7.0)
Neutrophils: 43 %
Platelets: 230 10*3/uL (ref 150–450)
RBC: 4.25 x10E6/uL (ref 4.14–5.80)
RDW: 13.1 % (ref 11.6–15.4)
WBC: 5.6 10*3/uL (ref 3.4–10.8)

## 2022-05-11 LAB — COMPREHENSIVE METABOLIC PANEL
ALT: 11 IU/L (ref 0–44)
AST: 17 IU/L (ref 0–40)
Albumin/Globulin Ratio: 2.4 — ABNORMAL HIGH (ref 1.2–2.2)
Albumin: 4.6 g/dL (ref 3.9–4.9)
Alkaline Phosphatase: 65 IU/L (ref 44–121)
BUN/Creatinine Ratio: 8 — ABNORMAL LOW (ref 10–24)
BUN: 8 mg/dL (ref 8–27)
Bilirubin Total: 0.4 mg/dL (ref 0.0–1.2)
CO2: 27 mmol/L (ref 20–29)
Calcium: 9.8 mg/dL (ref 8.6–10.2)
Chloride: 102 mmol/L (ref 96–106)
Creatinine, Ser: 0.95 mg/dL (ref 0.76–1.27)
Globulin, Total: 1.9 g/dL (ref 1.5–4.5)
Glucose: 98 mg/dL (ref 70–99)
Potassium: 4.4 mmol/L (ref 3.5–5.2)
Sodium: 141 mmol/L (ref 134–144)
Total Protein: 6.5 g/dL (ref 6.0–8.5)
eGFR: 88 mL/min/{1.73_m2} (ref >59)

## 2022-05-11 LAB — TSH: TSH: 0.295 u[IU]/mL — ABNORMAL LOW (ref 0.450–4.500)

## 2022-05-11 LAB — PSA: Prostate Specific Ag, Serum: 0.2 ng/mL (ref 0.0–4.0)

## 2022-05-12 ENCOUNTER — Encounter: Payer: Self-pay | Admitting: Family Medicine

## 2022-05-12 MED ORDER — LEVOTHYROXINE SODIUM 25 MCG PO TABS: 25.0000 ug | ORAL_TABLET | Freq: Every day | ORAL | 1 refills | Status: DC

## 2022-05-25 DIAGNOSIS — H353221 Exudative age-related macular degeneration, left eye, with active choroidal neovascularization: Secondary | ICD-10-CM | POA: Diagnosis not present

## 2022-06-04 ENCOUNTER — Other Ambulatory Visit: Payer: Self-pay | Admitting: Family Medicine

## 2022-06-07 NOTE — Telephone Encounter (Signed)
Requested Prescriptions  Pending Prescriptions Disp Refills  . levothyroxine (SYNTHROID) 25 MCG tablet [Pharmacy Med Name: LEVOTHYROXINE 25 MCG TABLET] 30 tablet 0    Sig: TAKE 1 TABLET BY MOUTH EVERY DAY     Endocrinology:  Hypothyroid Agents Failed - 06/04/2022  8:34 AM      Failed - TSH in normal range and within 360 days    TSH  Date Value Ref Range Status  05/10/2022 0.295 (L) 0.450 - 4.500 uIU/mL Final         Passed - Valid encounter within last 12 months    Recent Outpatient Visits          4 weeks ago Encounter for [employer] annual wellness exam   [place], [place], DO   6 months ago Routine general medical examination at a health care facility   [facility], [place] P, DO   8 months ago Sore throat   [place], Lauren A, NP   9 months ago Cellulitis of right ear   [place], Megan P, DO   10 months ago Cellulitis of right ear   [facility] Valerie Roys, DO      Future Appointments            In 3 weeks Wynetta Emery, Barb Merino, DO [facility]

## 2022-06-13 ENCOUNTER — Other Ambulatory Visit: Payer: Self-pay | Admitting: Family Medicine

## 2022-06-14 ENCOUNTER — Ambulatory Visit: Payer: Medicare Other | Admitting: Family Medicine

## 2022-06-14 NOTE — Telephone Encounter (Signed)
Not on current med list. Requested Prescriptions  Pending Prescriptions Disp Refills  . amLODipine (NORVASC) 2.5 MG tablet [Pharmacy Med Name: AMLODIPINE BESYLATE 2.5 MG TAB] 90 tablet 1    Sig: TAKE 1 TABLET BY MOUTH EVERY DAY     Cardiovascular: Calcium Channel Blockers 2 Passed - 06/13/2022  2:33 PM      Passed - Last BP in normal range    BP Readings from Last 1 Encounters:  05/10/22 103/64         Passed - Last Heart Rate in normal range    Pulse Readings from Last 1 Encounters:  05/10/22 73         Passed - Valid encounter within last 6 months    Recent Outpatient Visits          1 month ago Encounter for [employer] annual wellness exam   [place], Megan P, DO   7 months ago Routine general medical examination at a health care facility   [facility], [place] P, DO   8 months ago Sore throat   [place], Lauren A, NP   9 months ago Cellulitis of right ear   [place], Megan P, DO   10 months ago Cellulitis of right ear   [facility] Valerie Roys, DO      Future Appointments            In 2 weeks Wynetta Emery, Barb Merino, DO [facility], [facility]

## 2022-06-16 ENCOUNTER — Other Ambulatory Visit: Payer: Self-pay | Admitting: Family Medicine

## 2022-06-16 NOTE — Telephone Encounter (Signed)
Refusing the Synthroid 50 mcg refill request because it was discontinued on 05/12/2022.  Dose is now 25 mcg.

## 2022-06-20 ENCOUNTER — Other Ambulatory Visit: Payer: Self-pay | Admitting: Family Medicine

## 2022-06-21 NOTE — Telephone Encounter (Signed)
Medication no longer on current medication list Requested Prescriptions  Pending Prescriptions Disp Refills  . amLODipine (NORVASC) 2.5 MG tablet [Pharmacy Med Name: AMLODIPINE BESYLATE 2.5 MG TAB] 90 tablet 1    Sig: TAKE 1 TABLET BY MOUTH EVERY DAY     Cardiovascular: Calcium Channel Blockers 2 Passed - 06/20/2022  7:15 PM      Passed - Last BP in normal range    BP Readings from Last 1 Encounters:  05/10/22 103/64         Passed - Last Heart Rate in normal range    Pulse Readings from Last 1 Encounters:  05/10/22 73         Passed - Valid encounter within last 6 months    Recent Outpatient Visits          1 month ago Encounter for [employer] annual wellness exam   [place], Megan P, DO   7 months ago Routine general medical examination at a health care facility   [facility], [place] P, DO   9 months ago Sore throat   [place], Lauren A, NP   10 months ago Cellulitis of right ear   [place], Megan P, DO   10 months ago Cellulitis of right ear   [facility] Valerie Roys, DO      Future Appointments            In 1 week Wynetta Emery, Barb Merino, DO [facility], [facility]

## 2022-06-24 ENCOUNTER — Other Ambulatory Visit: Payer: Self-pay | Admitting: Family Medicine

## 2022-06-25 NOTE — Telephone Encounter (Signed)
Medication d/c'd 05/10/2022. Requested Prescriptions  Pending Prescriptions Disp Refills  . amLODipine (NORVASC) 2.5 MG tablet [Pharmacy Med Name: AMLODIPINE BESYLATE 2.5 MG TAB] 90 tablet 1    Sig: TAKE 1 TABLET BY MOUTH EVERY DAY     Cardiovascular: Calcium Channel Blockers 2 Passed - 06/24/2022  6:52 PM      Passed - Last BP in normal range    BP Readings from Last 1 Encounters:  05/10/22 103/64         Passed - Last Heart Rate in normal range    Pulse Readings from Last 1 Encounters:  05/10/22 73         Passed - Valid encounter within last 6 months    Recent Outpatient Visits          1 month ago Encounter for [employer] annual wellness exam   [place], Megan P, DO   7 months ago Routine general medical examination at a health care facility   [facility], [place] P, DO   9 months ago Sore throat   [place], NP   10 months ago Cellulitis of right ear   [place], Megan P, DO   10 months ago Cellulitis of right ear   [facility] Valerie Roys, DO      Future Appointments            In 3 days Wynetta Emery, Barb Merino, DO [facility], [place]

## 2022-06-28 ENCOUNTER — Ambulatory Visit (INDEPENDENT_AMBULATORY_CARE_PROVIDER_SITE_OTHER): Payer: Medicare Other | Admitting: Family Medicine

## 2022-06-28 ENCOUNTER — Encounter: Payer: Self-pay | Admitting: Family Medicine

## 2022-06-28 VITALS — BP 120/75 | HR 73 | Temp 98.0°F | Wt 159.0 lb

## 2022-06-28 DIAGNOSIS — I10 Essential (primary) hypertension: Secondary | ICD-10-CM

## 2022-06-28 DIAGNOSIS — Z23 Encounter for immunization: Secondary | ICD-10-CM | POA: Diagnosis not present

## 2022-06-28 NOTE — Progress Notes (Signed)
BP 120/75   Pulse 73   Temp 98 F (36.7 C)   Wt 159 lb (72.1 kg)   SpO2 100%   BMI 21.56 kg/m    Subjective:    Patient ID: Marcus Wright, male    DOB: 1955/12/12, 67 y.o.   MRN: [id number]  HPI: Marcus Wright is a 66 y.o. male  Chief Complaint  Patient presents with   Hypertension    Patient states he is still taking amlodipine but is not feeling dizzy anymore    HYPOTENSION  Hypotension status: better  Satisfied with current treatment? yes Duration of hypertension: chronic BP monitoring frequency:  not checking Medication compliance:  meds have stopped Previous BP meds: amlodipine Aspirin: no Recurrent headaches: no Visual changes: no Palpitations: no Dyspnea: no Chest pain: no Lower extremity edema: no Dizzy/lightheaded: no   Relevant past medical, surgical, family and social history reviewed and updated as indicated. Interim medical history since our last visit reviewed. Allergies and medications reviewed and updated.  Review of Systems  Constitutional: Negative.   Respiratory: Negative.    Cardiovascular: Negative.   Gastrointestinal: Negative.   Musculoskeletal: Negative.   Neurological: Negative.   Psychiatric/Behavioral: Negative.      Per HPI unless specifically indicated above     Objective:    BP 120/75   Pulse 73   Temp 98 F (36.7 C)   Wt 159 lb (72.1 kg)   SpO2 100%   BMI 21.56 kg/m   Wt Readings from Last 3 Encounters:  06/28/22 159 lb (72.1 kg)  05/10/22 154 lb (69.9 kg)  04/26/22 156 lb 12.8 oz (71.1 kg)    Physical Exam Vitals and nursing note reviewed.  Constitutional:      General: He is not in acute distress.    Appearance: Normal appearance. He is normal weight. He is not ill-appearing, toxic-appearing or diaphoretic.  HENT:     Head: Normocephalic and atraumatic.     Right Ear: External ear normal.     Left Ear: External ear normal.     Nose: Nose normal.     Mouth/Throat:     Mouth: Mucous membranes are  moist.     Pharynx: Oropharynx is clear.  Eyes:     General: No scleral icterus.       Right eye: No discharge.        Left eye: No discharge.     Extraocular Movements: Extraocular movements intact.     Conjunctiva/sclera: Conjunctivae normal.     Pupils: Pupils are equal, round, and reactive to light.  Cardiovascular:     Rate and Rhythm: Normal rate and regular rhythm.     Pulses: Normal pulses.     Heart sounds: Normal heart sounds. No murmur heard.    No friction rub. No gallop.  Pulmonary:     Effort: Pulmonary effort is normal. No respiratory distress.     Breath sounds: Normal breath sounds. No stridor. No wheezing, rhonchi or rales.  Chest:     Chest wall: No tenderness.  Musculoskeletal:        General: Normal range of motion.     Cervical back: Normal range of motion and neck supple.  Skin:    General: Skin is warm and dry.     Capillary Refill: Capillary refill takes less than 2 seconds.     Coloration: Skin is not jaundiced or pale.     Findings: No bruising, erythema, lesion or rash.  Neurological:  General: No focal deficit present.     Mental Status: He is alert and oriented to person, place, and time. Mental status is at baseline.  Psychiatric:        Mood and Affect: Mood normal.        Behavior: Behavior normal.        Thought Content: Thought content normal.        Judgment: Judgment normal.     Results for orders placed or performed in visit on 05/10/22  Comprehensive metabolic panel  Result Value Ref Range   Glucose 98 70 - 99 mg/dL   BUN 8 8 - 27 mg/dL   Creatinine, Ser 0.95 0.76 - 1.27 mg/dL   eGFR 88 >59 mL/min/1.73   BUN/Creatinine Ratio 8 (L) 10 - 24   Sodium 141 134 - 144 mmol/L   Potassium 4.4 3.5 - 5.2 mmol/L   Chloride 102 96 - 106 mmol/L   CO2 27 20 - 29 mmol/L   Calcium 9.8 8.6 - 10.2 mg/dL   Total Protein 6.5 6.0 - 8.5 g/dL   Albumin 4.6 3.9 - 4.9 g/dL   Globulin, Total 1.9 1.5 - 4.5 g/dL   Albumin/Globulin Ratio 2.4 (H) 1.2 -  2.2   Bilirubin Total 0.4 0.0 - 1.2 mg/dL   Alkaline Phosphatase 65 44 - 121 IU/L   AST 17 0 - 40 IU/L   ALT 11 0 - 44 IU/L  CBC with Differential/Platelet  Result Value Ref Range   WBC 5.6 3.4 - 10.8 x10E3/uL   RBC 4.25 4.14 - 5.80 x10E6/uL   Hemoglobin 12.0 (L) 13.0 - 17.7 g/dL   Hematocrit 35.7 (L) 37.5 - 51.0 %   MCV 84 79 - 97 fL   MCH 28.2 26.6 - 33.0 pg   MCHC 33.6 31.5 - 35.7 g/dL   RDW 13.1 11.6 - 15.4 %   Platelets 230 150 - 450 x10E3/uL   Neutrophils 43 Not Estab. %   Lymphs 43 Not Estab. %   Monocytes 9 Not Estab. %   Eos 4 Not Estab. %   Basos 1 Not Estab. %   Neutrophils Absolute 2.4 1.4 - 7.0 x10E3/uL   Lymphocytes Absolute 2.4 0.7 - 3.1 x10E3/uL   Monocytes Absolute 0.5 0.1 - 0.9 x10E3/uL   EOS (ABSOLUTE) 0.2 0.0 - 0.4 x10E3/uL   Basophils Absolute 0.1 0.0 - 0.2 x10E3/uL   Immature Granulocytes 0 Not Estab. %   Immature Grans (Abs) 0.0 0.0 - 0.1 x10E3/uL  Lipid Panel w/o Chol/HDL Ratio  Result Value Ref Range   Cholesterol, Total 130 100 - 199 mg/dL   Triglycerides 108 0 - 149 mg/dL   HDL 43 >39 mg/dL   VLDL Cholesterol Cal 20 5 - 40 mg/dL   LDL Chol Calc (NIH) 67 0 - 99 mg/dL  PSA  Result Value Ref Range   Prostate Specific Ag, Serum 0.2 0.0 - 4.0 ng/mL  TSH  Result Value Ref Range   TSH 0.295 (L) 0.450 - 4.500 uIU/mL  Urinalysis, Routine w reflex microscopic  Result Value Ref Range   Specific Gravity, UA 1.015 1.005 - 1.030   pH, UA 7.0 5.0 - 7.5   Color, UA Yellow Yellow   Appearance Ur Cloudy (A) Clear   Leukocytes,UA Negative Negative   Protein,UA Negative Negative/Trace   Glucose, UA Negative Negative   Ketones, UA Negative Negative   RBC, UA Negative Negative   Bilirubin, UA Negative Negative   Urobilinogen, Ur 1.0 0.2 - 1.0 mg/dL  Nitrite, UA Negative Negative  Microalbumin, Urine Waived  Result Value Ref Range   Microalb, Ur Waived 30 (H) 0 - 19 mg/L   Creatinine, Urine Waived 100 10 - 300 mg/dL   Microalb/Creat Ratio <30 <30 mg/g       Assessment & Plan:   Problem List Items Addressed This Visit       Cardiovascular and Mediastinum   Hypertension - Primary    Doing well off medicine. Continue to monitor. Call with any concerns.       Relevant Orders   Basic metabolic panel   Other Visit Diagnoses     Need for influenza vaccination       Relevant Orders   Flu Vaccine QUAD High Dose(Fluad) (Completed)        Follow up plan: Return in about 6 months (around 12/28/2022).

## 2022-06-28 NOTE — Assessment & Plan Note (Signed)
Doing well off medicine. Continue to monitor. Call with any concerns.  

## 2022-06-29 DIAGNOSIS — H353221 Exudative age-related macular degeneration, left eye, with active choroidal neovascularization: Secondary | ICD-10-CM | POA: Diagnosis not present

## 2022-06-29 LAB — BASIC METABOLIC PANEL
BUN/Creatinine Ratio: 9 — ABNORMAL LOW (ref 10–24)
BUN: 8 mg/dL (ref 8–27)
CO2: 25 mmol/L (ref 20–29)
Calcium: 9.8 mg/dL (ref 8.6–10.2)
Chloride: 102 mmol/L (ref 96–106)
Creatinine, Ser: 0.93 mg/dL (ref 0.76–1.27)
Glucose: 97 mg/dL (ref 70–99)
Potassium: 4.5 mmol/L (ref 3.5–5.2)
Sodium: 143 mmol/L (ref 134–144)
eGFR: 90 mL/min/{1.73_m2} (ref >59)

## 2022-06-30 ENCOUNTER — Encounter: Payer: Self-pay | Admitting: Family Medicine

## 2022-07-09 ENCOUNTER — Other Ambulatory Visit: Payer: Self-pay | Admitting: Family Medicine

## 2022-07-09 NOTE — Telephone Encounter (Signed)
Requested Prescriptions  Pending Prescriptions Disp Refills  . levothyroxine (SYNTHROID) 25 MCG tablet [Pharmacy Med Name: LEVOTHYROXINE 25 MCG TABLET] 90 tablet 0    Sig: TAKE 1 TABLET BY MOUTH EVERY DAY     Endocrinology:  Hypothyroid Agents Failed - 07/09/2022  1:30 PM      Failed - TSH in normal range and within 360 days    TSH  Date Value Ref Range Status  05/10/2022 0.295 (L) 0.450 - 4.500 uIU/mL Final         Passed - Valid encounter within last 12 months    Recent Outpatient Visits          1 week ago Primary hypertension   [place], Megan P, DO   2 months ago Encounter for [employer] annual wellness exam   [employer], Megan P, DO   8 months ago Routine general medical examination at a health care facility   [facility], Megan P, DO   9 months ago Sore throat   [place], Lauren A, NP   10 months ago Cellulitis of right ear   [place], [place], DO

## 2022-08-09 DIAGNOSIS — H353221 Exudative age-related macular degeneration, left eye, with active choroidal neovascularization: Secondary | ICD-10-CM | POA: Diagnosis not present

## 2022-09-06 DIAGNOSIS — H353221 Exudative age-related macular degeneration, left eye, with active choroidal neovascularization: Secondary | ICD-10-CM | POA: Diagnosis not present

## 2022-09-25 ENCOUNTER — Other Ambulatory Visit: Payer: Self-pay | Admitting: Family Medicine

## 2022-09-26 NOTE — Telephone Encounter (Signed)
Requested medication (s) are due for refill today: yes  Requested medication (s) are on the active medication list: yes  Last refill:  07/09/22 #90  Future visit scheduled: no  Notes to clinic: Located in the TSH results note dated 05/10/22- pt was to come back in a few weeks for TSH- was not ordered and not done Requested Prescriptions  Pending Prescriptions Disp Refills   levothyroxine (SYNTHROID) 25 MCG tablet [Pharmacy Med Name: LEVOTHYROXINE 25 MCG TABLET] 90 tablet 0    Sig: TAKE 1 [place] DAY     Endocrinology:  Hypothyroid Agents Failed - 09/25/2022  2:29 PM      Failed - TSH in normal range and within 360 days    TSH  Date Value Ref Range Status  05/10/2022 0.295 (L) 0.450 - 4.500 uIU/mL Final         Passed - Valid encounter within last 12 months    Recent Outpatient Visits           3 months ago Primary hypertension   [place], Megan P, DO   4 months ago Encounter for [employer] annual wellness exam   [employer], Megan P, DO   10 months ago Routine general medical examination at a health care facility   [place], [place], DO   1 year ago Sore throat   [place], NP   1 year ago Cellulitis of right ear   [place], [place], DO

## 2022-09-28 NOTE — Telephone Encounter (Signed)
LVM asking patient to call back to schedule his follow up appointment. Pt due 12/28/2022

## 2022-09-29 NOTE — Telephone Encounter (Signed)
2nd attempt to reach patient.

## 2022-09-30 NOTE — Telephone Encounter (Signed)
Acupuncturist (granddaughter) returned the call, and I gave [employer] [address], [place] - LVM asking patient to call back to schedule his follow up appointment. Pt due 12/28/2022   Per request, scheduled pt for 12/17/22 Amber stated pt needs medication; if possible, please send refill.   Please advise.

## 2022-10-11 DIAGNOSIS — H353221 Exudative age-related macular degeneration, left eye, with active choroidal neovascularization: Secondary | ICD-10-CM | POA: Diagnosis not present

## 2022-10-20 ENCOUNTER — Encounter: Payer: Self-pay | Admitting: Family Medicine

## 2022-11-09 DIAGNOSIS — H353221 Exudative age-related macular degeneration, left eye, with active choroidal neovascularization: Secondary | ICD-10-CM | POA: Diagnosis not present

## 2022-11-29 ENCOUNTER — Other Ambulatory Visit: Payer: Self-pay | Admitting: Family Medicine

## 2022-11-30 NOTE — Telephone Encounter (Signed)
Requested Prescriptions  Pending Prescriptions Disp Refills   albuterol (VENTOLIN HFA) 108 (90 Base) MCG/ACT inhaler [Pharmacy Med Name: ALBUTEROL HFA (PROAIR) INHALER] 8.5 each 6    Sig: INHALE 2 PUFFS INTO THE LUNGS EVERY 4 HOURS AS NEEDED FOR WHEEZING OR SHORTNESS OF BREATH.     Pulmonology:  Beta Agonists 2 Passed - 11/29/2022  3:25 PM      Passed - Last BP in normal range    BP Readings from Last 1 Encounters:  06/28/22 120/75         Passed - Last Heart Rate in normal range    Pulse Readings from Last 1 Encounters:  06/28/22 73         Passed - Valid encounter within last 12 months    Recent Outpatient Visits           5 months ago Primary hypertension   [place], [place] P, DO   6 months ago Encounter for [employer] annual wellness exam   [place], [place], DO   1 year ago Routine general medical examination at a health care facility   [place], [place], DO   1 year ago Sore throat   [place], NP   1 year ago Cellulitis of right ear   [place], [place], DO       Future Appointments             In 3 weeks Wynetta Emery, Barb Merino, DO [place], [facility]

## 2022-12-04 ENCOUNTER — Emergency Department: Payer: Medicare Other

## 2022-12-04 ENCOUNTER — Emergency Department
Admission: EM | Admit: 2022-12-04 | Discharge: 2022-12-05 | Disposition: A | Discharge status: Home / Self Care | Payer: Medicare Other | Attending: Emergency Medicine | Admitting: Emergency Medicine

## 2022-12-04 DIAGNOSIS — R0689 Other abnormalities of breathing: Secondary | ICD-10-CM | POA: Diagnosis not present

## 2022-12-04 DIAGNOSIS — R0602 Shortness of breath: Secondary | ICD-10-CM | POA: Insufficient documentation

## 2022-12-04 DIAGNOSIS — K047 Periapical abscess without sinus: Secondary | ICD-10-CM | POA: Diagnosis not present

## 2022-12-04 DIAGNOSIS — Z743 Need for continuous supervision: Secondary | ICD-10-CM | POA: Diagnosis not present

## 2022-12-04 DIAGNOSIS — R6889 Other general symptoms and signs: Secondary | ICD-10-CM | POA: Diagnosis not present

## 2022-12-04 LAB — COOXEMETRY PANEL
Carboxyhemoglobin: <0.3 % — ABNORMAL LOW (ref 0.5–1.5)
Methemoglobin: <0.7 % (ref 0.0–1.5)
O2 Saturation: 45.5 %
Total hemoglobin: 12.9 g/dL (ref 12.0–16.0)
Total oxygen content: 45.5 %

## 2022-12-04 LAB — CBC WITH DIFFERENTIAL/PLATELET
Abs Immature Granulocytes: 0.03 10*3/uL (ref 0.00–0.07)
Basophils Absolute: 0.1 10*3/uL (ref 0.0–0.1)
Basophils Relative: 1 %
Eosinophils Absolute: 0.3 10*3/uL (ref 0.0–0.5)
Eosinophils Relative: 4 %
HCT: 38.1 % — ABNORMAL LOW (ref 39.0–52.0)
Hemoglobin: 12.4 g/dL — ABNORMAL LOW (ref 13.0–17.0)
Immature Granulocytes: 0 %
Lymphocytes Relative: 29 %
Lymphs Abs: 2.2 10*3/uL (ref 0.7–4.0)
MCH: 28.2 pg (ref 26.0–34.0)
MCHC: 32.5 g/dL (ref 30.0–36.0)
MCV: 86.6 fL (ref 80.0–100.0)
Monocytes Absolute: 0.7 10*3/uL (ref 0.1–1.0)
Monocytes Relative: 9 %
Neutro Abs: 4.2 10*3/uL (ref 1.7–7.7)
Neutrophils Relative %: 57 %
Platelets: 185 10*3/uL (ref 150–400)
RBC: 4.4 MIL/uL (ref 4.22–5.81)
RDW: 12.7 % (ref 11.5–15.5)
WBC: 7.4 10*3/uL (ref 4.0–10.5)
nRBC: 0 % (ref 0.0–0.2)

## 2022-12-04 LAB — LACTIC ACID, PLASMA: Lactic Acid, Venous: 1.4 mmol/L (ref 0.5–1.9)

## 2022-12-04 LAB — TROPONIN I (HIGH SENSITIVITY): Troponin I (High Sensitivity): 7 ng/L (ref <18)

## 2022-12-04 MED ORDER — AMOXICILLIN-POT CLAVULANATE 875-125 MG PO TABS: 1.0000 | ORAL_TABLET | Freq: Two times a day (BID) | ORAL | 0 refills | Status: AC

## 2022-12-04 NOTE — Discharge Instructions (Signed)
We discussed how his heart marker did go up slightly but was still in the normal range.  However given he had resolution of his symptoms and no chest pain or shortness of breath we have opted to still allow you to go home but if he develops return of chest pain or shortness of breath please return to ER for repeat evaluation.  Use your albuterol inhaler every 2-4 hours as needed for wheezing or return of SOB.  Open the windows of your home for ventilation.  Take the antibiotic for your dental infection.

## 2022-12-04 NOTE — ED Notes (Signed)
Called RT to verify aware that blood for cooxemetry panel has been drawn and sent

## 2022-12-04 NOTE — ED Triage Notes (Signed)
Pt presents to the ED with [facility] from home for [facility]. Pt A&Ox4 at time of triage. Pt reports dyspnea on exertion. Resp equal, unlabored, and chest symmetrical. Pt breathing normally with occasional deep respirations and intermittent noisy breathing. Pt does have a hx of sinus cancer and has had radiation.

## 2022-12-04 NOTE — ED Provider Notes (Signed)
[facility] Provider Note    Event Date/Time   First MD Initiated Contact with Patient 12/04/22 2159     (approximate)   History   Shortness of Breath   HPI  Marcus Wright is a 67 y.o. male  here with SOB. History is somewhat limited 2/2 poor hearing and slurred speech at baseline. Pt reports that he used a "bug bomb" in his house then became SOB. He called EMS. Per EMS report, pt was tachypneic and c/o SOB and there did seem to be recently deployed bug bmobs in the house. He states he now feels better. He does c/o pain in his R lower jaw and has had some swelling there. No difficulty swallowing. Family stats his speech and mental status are at baseline.       Physical Exam   Triage Vital Signs: ED Triage Vitals  Enc Vitals Group     BP 12/04/22 2140 (!) 160/110     Pulse Rate 12/04/22 2140 76     Resp 12/04/22 2140 15     Temp 12/04/22 2140 98.1 F (36.7 C)     Temp Source 12/04/22 2140 Oral     SpO2 12/04/22 2134 100 %     Weight 12/04/22 2136 165 lb (74.8 kg)     Height 12/04/22 2136 6' (1.829 m)     Head Circumference --      Peak Flow --      Pain Score 12/04/22 2135 0     Pain Loc --      Pain Edu? --      Excl. in [place]? --     Most recent vital signs: Vitals:   12/05/22 0030 12/05/22 0100  BP: (!) 159/96 (!) 146/82  Pulse: 63 (!) 59  Resp: 14 11  Temp:    SpO2: 99% 97%     General: Awake, no distress.  CV:  Good peripheral perfusion. RRR. Resp:  Normal work of breathing. Lungs clear to auscultation bilaterally with normal WOB. Abd:  No distention. No tenderness. Other:  [place] poor dentition with erythema, slight swelling of right lower jaw. No purulence. No abscess. No sublingual swelling.   ED Results / Procedures / Treatments   Labs (all labs ordered are listed, but only abnormal results are displayed) Labs Reviewed  CBC WITH DIFFERENTIAL/PLATELET - Abnormal; Notable for the following components:      Result  Value   Hemoglobin 12.4 (*)    HCT 38.1 (*)    All other components within normal limits  COMPREHENSIVE METABOLIC PANEL - Abnormal; Notable for the following components:   Glucose, Bld 110 (*)    All other components within normal limits  COOXEMETRY PANEL - Abnormal; Notable for the following components:   Carboxyhemoglobin <0.3 (*)    All other components within normal limits  LACTIC ACID, PLASMA  BRAIN NATRIURETIC PEPTIDE  LACTIC ACID, PLASMA  TROPONIN I (HIGH SENSITIVITY)  TROPONIN I (HIGH SENSITIVITY)     EKG Normal sinus rhythm, VR 79. PR 57, QRS 11, QTc 443. No acute ST elevations or depressions.   RADIOLOGY Small nodular density in left mid lung. O/w normal.   I also independently reviewed and agree with radiologist interpretations.   PROCEDURES:  Critical Care performed: No   MEDICATIONS ORDERED IN ED: Medications - No data to display   IMPRESSION / MDM / [place] / ED COURSE  I reviewed the triage vital signs and the nursing notes.  Differential diagnosis includes, but is not limited to, CAP, CHF, ACS, bronchospasm, chemical pneumonitis, anemia.  Patient's presentation is most consistent with acute presentation with potential threat to life or bodily function.  The patient is on the cardiac monitor to evaluate for evidence of arrhythmia and/or significant heart rate changes  67 yo M here with SOB in setting of exposure to "bug bomb" at his house today. Lungs are clear now and he is satting well on RA. CXR is clear. Labs pending. EKG nonischemic and he has no CP, do not suspect ACS. Suspect possible transient bronchospasm in setting of environmental exposure whic his now resolved.   Otherwise, tp does incidentally have moderate swelling and pain of R lower jaw with significantly poor dentition. H/o recurrent dental infections 2/2 his dry mouth from prior chemo and radiation. Will cover with augmentin. He does not appear  septic or have appreciable abscess. No signs of Ludwig's.    FINAL CLINICAL IMPRESSION(S) / ED DIAGNOSES   Final diagnoses:  SOB (shortness of breath)  Dental infection     Rx / DC Orders   ED Discharge Orders          Ordered    amoxicillin-clavulanate (AUGMENTIN) 875-125 MG tablet  2 times daily        12/04/22 2350             Note:  This document was prepared using Dragon voice recognition software and may include unintentional dictation errors.   Duffy Bruce, MD 12/05/22 [phone]

## 2022-12-05 LAB — COMPREHENSIVE METABOLIC PANEL
ALT: 16 U/L (ref 0–44)
AST: 30 U/L (ref 15–41)
Albumin: 4.1 g/dL (ref 3.5–5.0)
Alkaline Phosphatase: 59 U/L (ref 38–126)
Anion gap: 5 (ref 5–15)
BUN: 10 mg/dL (ref 8–23)
CO2: 29 mmol/L (ref 22–32)
Calcium: 9 mg/dL (ref 8.9–10.3)
Chloride: 103 mmol/L (ref 98–111)
Creatinine, Ser: 0.85 mg/dL (ref 0.61–1.24)
GFR, Estimated: >60 mL/min (ref >60)
Glucose, Bld: 110 mg/dL — ABNORMAL HIGH (ref 70–99)
Potassium: 3.6 mmol/L (ref 3.5–5.1)
Sodium: 137 mmol/L (ref 135–145)
Total Bilirubin: 0.8 mg/dL (ref 0.3–1.2)
Total Protein: 6.5 g/dL (ref 6.5–8.1)

## 2022-12-05 LAB — BRAIN NATRIURETIC PEPTIDE: B Natriuretic Peptide: 18.1 pg/mL (ref 0.0–100.0)

## 2022-12-05 LAB — TROPONIN I (HIGH SENSITIVITY): Troponin I (High Sensitivity): 17 ng/L (ref <18)

## 2022-12-05 NOTE — ED Provider Notes (Signed)
3:04 AM Assumed care for off going team.   Blood pressure (!) 165/90, pulse (!) 59, temperature 98.1 F (36.7 C), temperature source Oral, resp. rate 18, height 6' (1.829 m), weight 74.8 kg, SpO2 96 %.  See their HPI for full report but in brief pending trop  Call ox is normal.  CBC shows stable hemoglobin.  CMP normal BNP normal lactate normal.  Reevaluated patient.  I was able to write on the board if he had any chest pain or shortness of breath and he stated no to both.  Joslyn Hy is at bedside who is POA.  They do report that there were bug bombs in his house they suspect caused him to flareup and start havign SOB..  Since being in the emergency room his shortness of breath has resolved.  His troponin did go from 7-17 which I did discuss with them but this could just be from the bronchospasm.  We discussed repeating a troponin 2 hours versus going home but given he has resolution of symptoms they prefer to go home.  We considered the possibility of pulmonary embolism but given resolution of symptoms in the setting of bug bomb exposure seems less likely.  No swelling in his legs or calf tenderness to suggest PE.  At this time they feel comfortable going home on the course of Augmentin prescribed by Dr. Ellender Hose and they will return if they develop any chest pain or shortness of breath.  He is going to evaluate the house before letting him go back in and make sure that the exposure is not there.   Considered admission but patient's been observed in the emergency room for over 4 hours without continued symptoms after being removed from the bug bomb exposure       Vanessa Reid, MD 12/05/22 [phone]

## 2022-12-10 DIAGNOSIS — H353221 Exudative age-related macular degeneration, left eye, with active choroidal neovascularization: Secondary | ICD-10-CM | POA: Diagnosis not present

## 2022-12-14 ENCOUNTER — Telehealth: Payer: Self-pay

## 2022-12-14 NOTE — Telephone Encounter (Signed)
        Patient  visited [facility] on 12/05/2022  for shortness of breath.   Telephone encounter attempt :  1st  Unable to leave message no answer/busy.   [place] Resource Care Guide   [email or web address]  ?? [phone]   Website: [email or web address]  [email or web address]

## 2022-12-16 ENCOUNTER — Telehealth: Payer: Self-pay

## 2022-12-16 NOTE — Telephone Encounter (Signed)
        Patient  visited [facility] on 12/05/2022  for shortness of breath.   Telephone encounter attempt :  2nd  Unable to leave message no answer/busy.   [place] Resource Care Guide   [email or web address]  ?? [id number]   Website: [email or web address]  [email or web address]

## 2022-12-17 ENCOUNTER — Telehealth: Payer: Self-pay

## 2022-12-17 ENCOUNTER — Ambulatory Visit: Payer: Medicare Other | Admitting: Family Medicine

## 2022-12-17 NOTE — Telephone Encounter (Signed)
        Patient  visited [facility] on 12/05/2022  for shortness of breath.   Telephone encounter attempt :  3rd  Unable to leave message no answer/busy.    [place] Resource Care Guide   [email or web address]  ?? [phone]   Website: [email or web address]  [email or web address]

## 2022-12-20 ENCOUNTER — Other Ambulatory Visit: Payer: Self-pay | Admitting: Family Medicine

## 2022-12-21 ENCOUNTER — Ambulatory Visit (INDEPENDENT_AMBULATORY_CARE_PROVIDER_SITE_OTHER): Payer: Medicare Other | Admitting: Family Medicine

## 2022-12-21 ENCOUNTER — Other Ambulatory Visit: Payer: Self-pay | Admitting: Family Medicine

## 2022-12-21 ENCOUNTER — Encounter: Payer: Self-pay | Admitting: Family Medicine

## 2022-12-21 VITALS — BP 102/67 | HR 65 | Temp 97.7°F | Wt 158.9 lb

## 2022-12-21 DIAGNOSIS — I10 Essential (primary) hypertension: Secondary | ICD-10-CM

## 2022-12-21 DIAGNOSIS — E782 Mixed hyperlipidemia: Secondary | ICD-10-CM

## 2022-12-21 DIAGNOSIS — E039 Hypothyroidism, unspecified: Secondary | ICD-10-CM | POA: Diagnosis not present

## 2022-12-21 DIAGNOSIS — E079 Disorder of thyroid, unspecified: Secondary | ICD-10-CM | POA: Diagnosis not present

## 2022-12-21 MED ORDER — OFLOXACIN 0.3 % OP SOLN: OPHTHALMIC | 0 refills | Status: DC

## 2022-12-21 MED ORDER — ROSUVASTATIN CALCIUM 20 MG PO TABS: 20.0000 mg | ORAL_TABLET | Freq: Every day | ORAL | 1 refills | Status: DC

## 2022-12-21 NOTE — Telephone Encounter (Signed)
Requested Prescriptions  Pending Prescriptions Disp Refills   fluticasone (FLONASE) 50 MCG/ACT nasal spray [Pharmacy Med Name: FLUTICASONE PROP 50 MCG SPRAY] 48 mL 4    Sig: SPRAY 2 SPRAYS INTO EACH NOSTRIL EVERY DAY     Ear, Nose, and Throat: Nasal Preparations - Corticosteroids Passed - 12/20/2022  7:34 PM      Passed - Valid encounter within last 12 months    Recent Outpatient Visits           Today Primary hypertension   [place], [place], DO   5 months ago Primary hypertension   [place], [place], DO   7 months ago Encounter for [employer] annual wellness exam   [place], DO   1 year ago Routine general medical examination at a health care facility   [place], [place], DO   1 year ago Sore throat   [place], NP       Future Appointments             In 6 months Wynetta Emery, Barb Merino, DO [place], [facility]

## 2022-12-21 NOTE — Assessment & Plan Note (Signed)
Doing well on no medication. Continue to monitor. Call with any concerns.

## 2022-12-21 NOTE — Assessment & Plan Note (Signed)
Rechecking labs today. Await results. Treat as needed.  °

## 2022-12-21 NOTE — Progress Notes (Signed)
BP 102/67   Pulse 65   Temp 97.7 F (36.5 C) (Oral)   Wt 158 lb 14.4 oz (72.1 kg)   SpO2 97%   BMI 21.55 kg/m    Subjective:    Patient ID: Marcus Wright, male    DOB: 1956/05/03, 67 y.o.   MRN: [id number]  HPI: Marcus Wright is a 67 y.o. male  Chief Complaint  Patient presents with   Hypertension   ER FOLLOW UP Time since discharge: 17 days Hospital/facility: [facility] Diagnosis: SOB after using a bug bomb at home Procedures/tests: Labs, CXR, EKG  Consultants: none New medications: none Discharge instructions: follow up here  Status: better- has entirely resolved.  HYPERTENSION / HYPERLIPIDEMIA Satisfied with current treatment? yes Duration of hypertension: chronic BP monitoring frequency: not checking BP medication side effects: no Past BP meds: none Duration of hyperlipidemia: chronic Cholesterol medication side effects: no Cholesterol supplements: none Past cholesterol medications: crestor Medication compliance: excellent compliance Aspirin: no Recent stressors: no Recurrent headaches: no Visual changes: no Palpitations: no Dyspnea: no Chest pain: no Lower extremity edema: no Dizzy/lightheaded: no  HYPOTHYROIDISM Thyroid control status:controlled Satisfied with current treatment? yes Medication side effects: no Medication compliance: excellent compliance Recent dose adjustment:no Fatigue: no Cold intolerance: yes Heat intolerance: yes Weight gain: no Weight loss: yes Constipation: no Diarrhea/loose stools: no Palpitations: no Lower extremity edema: no Anxiety/depressed mood: no   Relevant past medical, surgical, family and social history reviewed and updated as indicated. Interim medical history since our last visit reviewed. Allergies and medications reviewed and updated.  Review of Systems  Constitutional:  Positive for chills and diaphoresis. Negative for activity change, appetite change, fatigue, fever and unexpected weight  change.  HENT: Negative.    Respiratory: Negative.    Cardiovascular: Negative.   Gastrointestinal: Negative.   Psychiatric/Behavioral: Negative.      Per HPI unless specifically indicated above     Objective:    BP 102/67   Pulse 65   Temp 97.7 F (36.5 C) (Oral)   Wt 158 lb 14.4 oz (72.1 kg)   SpO2 97%   BMI 21.55 kg/m   Wt Readings from Last 3 Encounters:  12/21/22 158 lb 14.4 oz (72.1 kg)  12/04/22 165 lb (74.8 kg)  06/28/22 159 lb (72.1 kg)    Physical Exam Vitals and nursing note reviewed.  Constitutional:      General: He is not in acute distress.    Appearance: Normal appearance. He is normal weight. He is not ill-appearing, toxic-appearing or diaphoretic.  HENT:     Head: Normocephalic and atraumatic.     Right Ear: External ear normal.     Left Ear: External ear normal.     Nose: Nose normal.     Mouth/Throat:     Mouth: Mucous membranes are moist.     Pharynx: Oropharynx is clear.  Eyes:     General: No scleral icterus.       Right eye: No discharge.        Left eye: No discharge.     Extraocular Movements: Extraocular movements intact.     Conjunctiva/sclera: Conjunctivae normal.     Pupils: Pupils are equal, round, and reactive to light.  Cardiovascular:     Rate and Rhythm: Normal rate and regular rhythm.     Pulses: Normal pulses.     Heart sounds: Normal heart sounds. No murmur heard.    No friction rub. No gallop.  Pulmonary:     Effort: Pulmonary  effort is normal. No respiratory distress.     Breath sounds: Normal breath sounds. No stridor. No wheezing, rhonchi or rales.  Chest:     Chest wall: No tenderness.  Musculoskeletal:        General: Normal range of motion.     Cervical back: Normal range of motion and neck supple.  Skin:    General: Skin is warm and dry.     Capillary Refill: Capillary refill takes less than 2 seconds.     Coloration: Skin is not jaundiced or pale.     Findings: No bruising, erythema, lesion or rash.   Neurological:     General: No focal deficit present.     Mental Status: He is alert and oriented to person, place, and time. Mental status is at baseline.  Psychiatric:        Mood and Affect: Mood normal.        Behavior: Behavior normal.        Thought Content: Thought content normal.        Judgment: Judgment normal.     Results for orders placed or performed during the hospital encounter of 12/04/22  CBC with Differential  Result Value Ref Range   WBC 7.4 4.0 - 10.5 K/uL   RBC 4.40 4.22 - 5.81 MIL/uL   Hemoglobin 12.4 (L) 13.0 - 17.0 g/dL   HCT 38.1 (L) 39.0 - 52.0 %   MCV 86.6 80.0 - 100.0 fL   MCH 28.2 26.0 - 34.0 pg   MCHC 32.5 30.0 - 36.0 g/dL   RDW 12.7 11.5 - 15.5 %   Platelets 185 150 - 400 K/uL   nRBC 0.0 0.0 - 0.2 %   Neutrophils Relative % 57 %   Neutro Abs 4.2 1.7 - 7.7 K/uL   Lymphocytes Relative 29 %   Lymphs Abs 2.2 0.7 - 4.0 K/uL   Monocytes Relative 9 %   Monocytes Absolute 0.7 0.1 - 1.0 K/uL   Eosinophils Relative 4 %   Eosinophils Absolute 0.3 0.0 - 0.5 K/uL   Basophils Relative 1 %   Basophils Absolute 0.1 0.0 - 0.1 K/uL   Immature Granulocytes 0 %   Abs Immature Granulocytes 0.03 0.00 - 0.07 K/uL  Comprehensive metabolic panel  Result Value Ref Range   Sodium 137 135 - 145 mmol/L   Potassium 3.6 3.5 - 5.1 mmol/L   Chloride 103 98 - 111 mmol/L   CO2 29 22 - 32 mmol/L   Glucose, Bld 110 (H) 70 - 99 mg/dL   BUN 10 8 - 23 mg/dL   Creatinine, Ser 0.85 0.61 - 1.24 mg/dL   Calcium 9.0 8.9 - 10.3 mg/dL   Total Protein 6.5 6.5 - 8.1 g/dL   Albumin 4.1 3.5 - 5.0 g/dL   AST 30 15 - 41 U/L   ALT 16 0 - 44 U/L   Alkaline Phosphatase 59 38 - 126 U/L   Total Bilirubin 0.8 0.3 - 1.2 mg/dL   GFR, Estimated >60 >60 mL/min   Anion gap 5 5 - 15  Lactic acid, plasma  Result Value Ref Range   Lactic Acid, Venous 1.4 0.5 - 1.9 mmol/L  Brain natriuretic peptide  Result Value Ref Range   B Natriuretic Peptide 18.1 0.0 - 100.0 pg/mL  Cooxemetry Panel,  hospital-performed (carboxy, met, total hgb, O2 sat)  Result Value Ref Range   Total hemoglobin 12.9 12.0 - 16.0 g/dL   O2 Saturation 45.5 %   Carboxyhemoglobin <0.3 (L) 0.5 -  1.5 %   Methemoglobin <0.7 0.0 - 1.5 %   Total oxygen content 45.5 %  Troponin I (High Sensitivity)  Result Value Ref Range   Troponin I (High Sensitivity) 7 <18 ng/L  Troponin I (High Sensitivity)  Result Value Ref Range   Troponin I (High Sensitivity) 17 <18 ng/L      Assessment & Plan:   Problem List Items Addressed This Visit       Cardiovascular and Mediastinum   Hypertension - Primary    Doing well on no medication. Continue to monitor. Call with any concerns.       Relevant Medications   rosuvastatin (CRESTOR) 20 MG tablet   Other Relevant Orders   CBC with Differential/Platelet   Comprehensive metabolic panel     Endocrine   Hypothyroidism    Rechecking labs today. Await results. Treat as needed.        Other   Hyperlipidemia    Under good control on current regimen. Continue current regimen. Continue to monitor. Call with any concerns. Refills given. Labs drawn today.       Relevant Medications   rosuvastatin (CRESTOR) 20 MG tablet   Other Relevant Orders   CBC with Differential/Platelet   Comprehensive metabolic panel   Lipid Panel w/o Chol/HDL Ratio     Follow up plan: Return in about 6 months (around 06/23/2023) for physical.

## 2022-12-21 NOTE — Assessment & Plan Note (Signed)
Under good control on current regimen. Continue current regimen. Continue to monitor. Call with any concerns. Refills given. Labs drawn today.   

## 2022-12-22 ENCOUNTER — Other Ambulatory Visit: Payer: Self-pay | Admitting: Family Medicine

## 2022-12-22 DIAGNOSIS — E039 Hypothyroidism, unspecified: Secondary | ICD-10-CM

## 2022-12-22 LAB — CBC WITH DIFFERENTIAL/PLATELET
Basophils Absolute: 0.1 10*3/uL (ref 0.0–0.2)
Basos: 1 %
EOS (ABSOLUTE): 0.2 10*3/uL (ref 0.0–0.4)
Eos: 4 %
Hematocrit: 41.5 % (ref 37.5–51.0)
Hemoglobin: 14 g/dL (ref 13.0–17.7)
Immature Grans (Abs): 0 10*3/uL (ref 0.0–0.1)
Immature Granulocytes: 0 %
Lymphocytes Absolute: 2 10*3/uL (ref 0.7–3.1)
Lymphs: 40 %
MCH: 28.9 pg (ref 26.6–33.0)
MCHC: 33.7 g/dL (ref 31.5–35.7)
MCV: 86 fL (ref 79–97)
Monocytes Absolute: 0.3 10*3/uL (ref 0.1–0.9)
Monocytes: 7 %
Neutrophils Absolute: 2.4 10*3/uL (ref 1.4–7.0)
Neutrophils: 48 %
Platelets: 253 10*3/uL (ref 150–450)
RBC: 4.85 x10E6/uL (ref 4.14–5.80)
RDW: 13 % (ref 11.6–15.4)
WBC: 4.9 10*3/uL (ref 3.4–10.8)

## 2022-12-22 LAB — COMPREHENSIVE METABOLIC PANEL
ALT: 32 IU/L (ref 0–44)
AST: 34 IU/L (ref 0–40)
Albumin/Globulin Ratio: 1.9 (ref 1.2–2.2)
Albumin: 4.7 g/dL (ref 3.9–4.9)
Alkaline Phosphatase: 90 IU/L (ref 44–121)
BUN/Creatinine Ratio: 10 (ref 10–24)
BUN: 10 mg/dL (ref 8–27)
Bilirubin Total: 0.6 mg/dL (ref 0.0–1.2)
CO2: 25 mmol/L (ref 20–29)
Calcium: 10.2 mg/dL (ref 8.6–10.2)
Chloride: 99 mmol/L (ref 96–106)
Creatinine, Ser: 0.97 mg/dL (ref 0.76–1.27)
Globulin, Total: 2.5 g/dL (ref 1.5–4.5)
Glucose: 104 mg/dL — ABNORMAL HIGH (ref 70–99)
Potassium: 4.2 mmol/L (ref 3.5–5.2)
Sodium: 143 mmol/L (ref 134–144)
Total Protein: 7.2 g/dL (ref 6.0–8.5)
eGFR: 85 mL/min/{1.73_m2} (ref >59)

## 2022-12-22 LAB — TSH: TSH: 5.24 u[IU]/mL — ABNORMAL HIGH (ref 0.450–4.500)

## 2022-12-22 LAB — LIPID PANEL W/O CHOL/HDL RATIO
Cholesterol, Total: 155 mg/dL (ref 100–199)
HDL: 51 mg/dL (ref >39)
LDL Chol Calc (NIH): 85 mg/dL (ref 0–99)
Triglycerides: 102 mg/dL (ref 0–149)
VLDL Cholesterol Cal: 19 mg/dL (ref 5–40)

## 2022-12-22 MED ORDER — LEVOTHYROXINE SODIUM 50 MCG PO TABS: 50.0000 ug | ORAL_TABLET | Freq: Every day | ORAL | 1 refills | Status: DC

## 2022-12-22 NOTE — Telephone Encounter (Signed)
Requested medication (s) are due for refill today: not quite  Requested medication (s) are on the active medication list: yes  Last refill:  09/30/22 #90/0  Future visit scheduled: yes  Notes to clinic:  pt had labs done yesterday and TSH was abnormal and higher than previous so didn't want to refill current dose since provider hasn't reviewed results yet.    Requested Prescriptions  Pending Prescriptions Disp Refills   levothyroxine (SYNTHROID) 25 MCG tablet [Pharmacy Med Name: LEVOTHYROXINE 25 MCG TABLET] 90 tablet 0    Sig: TAKE 1 TABLET BY MOUTH EVERY DAY     Endocrinology:  Hypothyroid Agents Failed - 12/21/2022 11:07 AM      Failed - TSH in normal range and within 360 days    TSH  Date Value Ref Range Status  12/21/2022 5.240 (H) 0.450 - 4.500 uIU/mL Final         Passed - Valid encounter within last 12 months    Recent Outpatient Visits           Yesterday Primary hypertension   [place], [place], DO   5 months ago Primary hypertension   [place], [place], DO   7 months ago Encounter for [employer] annual wellness exam   [place], DO   1 year ago Routine general medical examination at a health care facility   [place], [place], DO   1 year ago Sore throat   [place], NP       Future Appointments             In 6 months Wynetta Emery, Barb Merino, DO [place], [facility]

## 2023-01-03 ENCOUNTER — Other Ambulatory Visit: Payer: Self-pay | Admitting: Family Medicine

## 2023-01-04 NOTE — Telephone Encounter (Signed)
Requested medication (s) are due for refill today - no  Requested medication (s) are on the active medication list -yes  Future visit scheduled -yes  Last refill: 12/21/22 67ml  Notes to clinic: off protocol- provider review - possible duplicate  Requested Prescriptions  Pending Prescriptions Disp Refills   ofloxacin (OCUFLOX) 0.3 % ophthalmic solution [Pharmacy Med Name: OFLOXACIN 0.3% EYE DROPS] 5 mL 0    Sig: INSTILL 1 DROP 4 TIMES A DAY RIGHT EAR     Off-Protocol Failed - 01/03/2023  7:23 PM      Failed - Medication not assigned to a protocol, review manually.      Passed - Valid encounter within last 12 months    Recent Outpatient Visits           2 weeks ago Primary hypertension   [place] [facility] [place], [place], DO   6 months ago Primary hypertension   [place] [facility] [place], Megan P, DO   7 months ago Encounter for [employer] annual wellness exam   [place] [facility] [place], [place] P, DO   1 year ago Routine general medical examination at a health care facility   [facility] [place], [place] P, DO   1 year ago Sore throat   [place] [facility] Gerre Scull, NP       Future Appointments             In 5 months La[place] Benes, Oralia Rud, DO [place] [facility], [facility]               Requested Prescriptions  Pending Prescriptions Disp Refills   ofloxacin (OCUFLOX) 0.3 % ophthalmic solution [Pharmacy Med Name: OFLOXACIN 0.3% EYE DROPS] 5 mL 0    Sig: INSTILL 1 DROP 4 TIMES A DAY RIGHT EAR     Off-Protocol Failed - 01/03/2023  7:23 PM      Failed - Medication not assigned to a protocol, review manually.      Passed - Valid encounter within last 12 months    Recent Outpatient Visits           2 weeks ago Primary hypertension   [place] [facility] [place], [place], DO   6 months ago Primary hypertension   [facility] [place], Megan P, DO   7 months ago Encounter for [employer] annual wellness exam   [place] [facility] [place], [place] P, DO   1 year ago Routine general medical examination at a health care facility   [facility] [place], [place] P, DO   1 year ago Sore throat   [place] [facility] Gerre Scull, NP       Future Appointments             In 5 months Laural Benes, Oralia Rud, DO [place] [facility], [facility]

## 2023-01-13 ENCOUNTER — Other Ambulatory Visit: Payer: Self-pay | Admitting: Family Medicine

## 2023-01-14 NOTE — Telephone Encounter (Signed)
Requested Prescriptions  Pending Prescriptions Disp Refills   levothyroxine (SYNTHROID) 50 MCG tablet [Pharmacy Med Name: LEVOTHYROXINE 50 MCG TABLET] 90 tablet 0    Sig: TAKE 1 TABLET BY MOUTH EVERY DAY     Endocrinology:  Hypothyroid Agents Failed - 01/13/2023 10:32 AM      Failed - TSH in normal range and within 360 days    TSH  Date Value Ref Range Status  12/21/2022 5.240 (H) 0.450 - 4.500 uIU/mL Final         Passed - Valid encounter within last 12 months    Recent Outpatient Visits           3 weeks ago Primary hypertension   [place] [facility] [place], [place] P, DO   6 months ago Primary hypertension   [place] [facility] [facility] [place], Megan P, DO   8 months ago Encounter for [employer] annual wellness exam   [place] [facility] [place], [place] P, DO   1 year ago Routine general medical examination at a health care facility   [facility] [place], [place] P, DO   1 year ago Sore throat   [place] [facility] Gerre Scull, NP       Future Appointments             In 5 months Laural Benes, Oralia Rud, DO  [facility], [facility]

## 2023-01-24 ENCOUNTER — Emergency Department: Payer: Medicare Other

## 2023-01-24 ENCOUNTER — Emergency Department
Admission: EM | Admit: 2023-01-24 | Discharge: 2023-01-24 | Disposition: A | Discharge status: Home / Self Care | Payer: Medicare Other | Attending: Emergency Medicine | Admitting: Emergency Medicine

## 2023-01-24 ENCOUNTER — Ambulatory Visit: Payer: Self-pay

## 2023-01-24 ENCOUNTER — Ambulatory Visit: Payer: Medicare Other | Admitting: Physician Assistant

## 2023-01-24 ENCOUNTER — Other Ambulatory Visit: Payer: Self-pay

## 2023-01-24 DIAGNOSIS — Z8522 Personal history of malignant neoplasm of nasal cavities, middle ear, and accessory sinuses: Secondary | ICD-10-CM | POA: Insufficient documentation

## 2023-01-24 DIAGNOSIS — J40 Bronchitis, not specified as acute or chronic: Secondary | ICD-10-CM

## 2023-01-24 DIAGNOSIS — H7291 Unspecified perforation of tympanic membrane, right ear: Secondary | ICD-10-CM | POA: Diagnosis not present

## 2023-01-24 DIAGNOSIS — R069 Unspecified abnormalities of breathing: Secondary | ICD-10-CM | POA: Diagnosis not present

## 2023-01-24 DIAGNOSIS — T7029XA Other effects of high altitude, initial encounter: Secondary | ICD-10-CM

## 2023-01-24 DIAGNOSIS — R0602 Shortness of breath: Secondary | ICD-10-CM | POA: Diagnosis not present

## 2023-01-24 DIAGNOSIS — I10 Essential (primary) hypertension: Secondary | ICD-10-CM | POA: Insufficient documentation

## 2023-01-24 DIAGNOSIS — Z743 Need for continuous supervision: Secondary | ICD-10-CM | POA: Diagnosis not present

## 2023-01-24 DIAGNOSIS — R6889 Other general symptoms and signs: Secondary | ICD-10-CM | POA: Diagnosis not present

## 2023-01-24 LAB — CBC
HCT: 39.4 % (ref 39.0–52.0)
Hemoglobin: 13.1 g/dL (ref 13.0–17.0)
MCH: 28.4 pg (ref 26.0–34.0)
MCHC: 33.2 g/dL (ref 30.0–36.0)
MCV: 85.3 fL (ref 80.0–100.0)
Platelets: 233 10*3/uL (ref 150–400)
RBC: 4.62 MIL/uL (ref 4.22–5.81)
RDW: 12.9 % (ref 11.5–15.5)
WBC: 8.6 10*3/uL (ref 4.0–10.5)
nRBC: 0 % (ref 0.0–0.2)

## 2023-01-24 LAB — BASIC METABOLIC PANEL
Anion gap: 8 (ref 5–15)
BUN: 9 mg/dL (ref 8–23)
CO2: 27 mmol/L (ref 22–32)
Calcium: 9.1 mg/dL (ref 8.9–10.3)
Chloride: 102 mmol/L (ref 98–111)
Creatinine, Ser: 0.79 mg/dL (ref 0.61–1.24)
GFR, Estimated: >60 mL/min (ref >60)
Glucose, Bld: 108 mg/dL — ABNORMAL HIGH (ref 70–99)
Potassium: 3.3 mmol/L — ABNORMAL LOW (ref 3.5–5.1)
Sodium: 137 mmol/L (ref 135–145)

## 2023-01-24 MED ORDER — CEPHALEXIN 500 MG PO CAPS: 500.0000 mg | ORAL_CAPSULE | Freq: Two times a day (BID) | ORAL | 0 refills | Status: DC

## 2023-01-24 MED ORDER — CEPHALEXIN 500 MG PO CAPS
500.0000 mg | ORAL_CAPSULE | Freq: Once | ORAL | Status: AC
  Administered 2023-01-24: 500 mg via ORAL
  Filled 2023-01-24: qty 1

## 2023-01-24 MED ORDER — OFLOXACIN 0.3 % OP SOLN: OPHTHALMIC | 0 refills | Status: DC

## 2023-01-24 MED ORDER — AZITHROMYCIN 250 MG PO TABS: ORAL_TABLET | ORAL | 0 refills | Status: AC

## 2023-01-24 MED ORDER — AZITHROMYCIN 500 MG PO TABS
500.0000 mg | ORAL_TABLET | Freq: Once | ORAL | Status: AC
  Administered 2023-01-24: 500 mg via ORAL
  Filled 2023-01-24: qty 1

## 2023-01-24 MED ORDER — GUAIFENESIN-CODEINE 100-10 MG/5ML PO SOLN: 5.0000 mL | Freq: Four times a day (QID) | ORAL | 0 refills | Status: DC | PRN

## 2023-01-24 NOTE — ED Triage Notes (Signed)
Pt to ED for trouble breathing and difficulty swallowing started at 0300 this am. Pt reports pain with coughing.  Pt has spit cut with him, however pt visualized swallowing saliva, RR even and unlabored. NAD noted Hx sinus cancer.

## 2023-01-24 NOTE — ED Provider Notes (Signed)
[facility] Provider Note    Event Date/Time   First MD Initiated Contact with Patient 01/24/23 1340     (approximate)  History   Chief Complaint: Shortness of Breath  HPI  Marcus Wright is a 67 y.o. male with a past medical history of sinus cancer, hypertension, hyperlipidemia, very hard of hearing, presents to the emergency department with cough with sputum production as well as ear drainage.  Patient is spitting up brown sputum at times in the emergency department.  No trouble swallowing contrary to triage note.  Somewhat difficult to communicate given significant hearing difficulties.  Physical Exam   Triage Vital Signs: ED Triage Vitals  Enc Vitals Group     BP 01/24/23 1226 128/79     Pulse Rate 01/24/23 1225 76     Resp 01/24/23 1225 18     Temp 01/24/23 1225 98.4 F (36.9 C)     Temp src --      SpO2 01/24/23 1225 99 %     Weight 01/24/23 1226 140 lb (63.5 kg)     Height --      Head Circumference --      Peak Flow --      Pain Score 01/24/23 1225 0     Pain Loc --      Pain Edu? --      Excl. in GC? --     Most recent vital signs: Vitals:   01/24/23 1225 01/24/23 1226  BP:  128/79  Pulse: 76   Resp: 18   Temp: 98.4 F (36.9 C)   SpO2: 99%     General: Awake, no distress.  CV:  Good peripheral perfusion.  Regular rate and rhythm  Resp:  Normal effort.  Equal breath sounds bilaterally.  Abd:  No distention.  Soft, nontender.  No rebound or guarding. Other:  Patient has mild drainage from the right ear he appears to have a defect in the tympanic membrane.  Possibly from the significant amount of coughing that the patient has been doing.  States he just noticed this this morning.  Patient states he has been coughing for the last 2 or 3 days now with brown sputum production.  Denies any fever.  Denies any trouble swallowing.   ED Results / Procedures / Treatments   EKG  EKG viewed and interpreted by myself shows a normal  sinus rhythm at 80 bpm with a narrow QRS, normal axis, normal intervals, nonspecific ST changes without ST elevation.  RADIOLOGY  I have reviewed and interpreted chest x-ray images.  No consolidation seen on my evaluation. Radiology has read the x-ray as chronic lung changes no acute process.   MEDICATIONS ORDERED IN ED: Medications  azithromycin (ZITHROMAX) tablet 500 mg (has no administration in time range)  cephALEXin (KEFLEX) capsule 500 mg (has no administration in time range)     IMPRESSION / MDM / ASSESSMENT AND PLAN / ED COURSE  I reviewed the triage vital signs and the nursing notes.  Patient's presentation is most consistent with acute presentation with potential threat to life or bodily function.  Patient presents emergency department for several days of cough now with brown sputum production.  Also since this morning he has had some drainage from the right ear.  On examination he appears to have a small defect of the tympanic membrane possibly ruptured tympanic membrane from the coughing the patient has been doing as well as blowing his nose frequently.  Symptoms are concerning  for acute bronchitis given the brown sputum production but otherwise reassuring chest x-ray.  Lab work is reassuring as well with a normal CBC with normal white blood cell count and a reassuring chemistry.  Given the patient's reassuring workup reassuring physical exam we will place the patient on ofloxacin eardrops as well as oral Keflex and Zithromax.  We will also prescribe a cough medication for the patient and have him follow-up with his doctor in 2 to 3 days for recheck.  Patient agreeable to plan.  FINAL CLINICAL IMPRESSION(S) / ED DIAGNOSES   Cough Bronchitis Tympanic membrane rupture   Note:  This document was prepared using Dragon voice recognition software and may include unintentional dictation errors.   Minna Antis, MD 01/24/23 [phone]

## 2023-01-24 NOTE — Telephone Encounter (Signed)
    Productive cough with wheezing, SOB. Symptoms: Above Frequency: Yesterday Pertinent Negatives: Patient denies fever Disposition: [] ED /[] Urgent Care (no appt availability in office) / [x] Appointment(In office/virtual)/ []  [facility]/ [] Home Care/ [] Refused Recommended Disposition /[] [place] [facility]/ []  Follow-up with PCP Additional Notes:   Reason for Disposition  [1] MILD difficulty breathing (e.g., minimal/no SOB at rest, SOB with walking, pulse <100) AND [2] still present when not coughing  Answer Assessment - Initial Assessment Questions 1. ONSET: "When did the cough begin?"      Yesterday 2. SEVERITY: "How bad is the cough today?"      Moderate 3. SPUTUM: "Describe the color of your sputum" (none, dry cough; clear, white, yellow, green)     clear 4. HEMOPTYSIS: "Are you coughing up any blood?" If so ask: "How much?" (flecks, streaks, tablespoons, etc.)     N/a 5. DIFFICULTY BREATHING: "Are you having difficulty breathing?" If Yes, ask: "How bad is it?" (e.g., mild, moderate, severe)    - MILD: No SOB at rest, mild SOB with walking, speaks normally in sentences, can lie down, no retractions, pulse < 100.    - MODERATE: SOB at rest, SOB with minimal exertion and prefers to sit, cannot lie down flat, speaks in phrases, mild retractions, audible wheezing, pulse 100-120.    - SEVERE: Very SOB at rest, speaks in single words, struggling to breathe, sitting hunched forward, retractions, pulse > 120      Mild 6. FEVER: "Do you have a fever?" If Yes, ask: "What is your temperature, how was it measured, and when did it start?"     No 7. CARDIAC HISTORY: "Do you have any history of heart disease?" (e.g., heart attack, congestive heart failure)      No 8. LUNG HISTORY: "Do you have any history of lung disease?"  (e.g., pulmonary embolus, asthma, emphysema)     No 9. PE RISK FACTORS: "Do you have a history of blood clots?" (or: recent major surgery, recent prolonged  travel, bedridden)     No 10. OTHER SYMPTOMS: "Do you have any other symptoms?" (e.g., runny nose, wheezing, chest pain)       Sore throat, wheezing 11. PREGNANCY: "Is there any chance you are pregnant?" "When was your last menstrual period?"       N/a 12. TRAVEL: "Have you traveled out of the country in the last month?" (e.g., travel history, exposures)       No  Protocols used: Cough - Acute Productive-A-AH

## 2023-01-24 NOTE — Discharge Instructions (Addendum)
Please take your antibiotics as prescribed.  Please also use your eardrops as prescribed.  You have also been prescribed a cough medication that he may use as needed, as written to help with your cough/discomfort.  Please follow-up with your doctor in 2 to 3 days for recheck/reevaluation.  Return to the emergency department for any worsening symptoms or any other symptom personally concerning to yourself.

## 2023-01-28 ENCOUNTER — Inpatient Hospital Stay: Payer: Medicare Other | Admitting: Family Medicine

## 2023-01-28 DIAGNOSIS — H353221 Exudative age-related macular degeneration, left eye, with active choroidal neovascularization: Secondary | ICD-10-CM | POA: Diagnosis not present

## 2023-01-31 ENCOUNTER — Ambulatory Visit (INDEPENDENT_AMBULATORY_CARE_PROVIDER_SITE_OTHER): Payer: Medicare Other | Admitting: Family Medicine

## 2023-01-31 ENCOUNTER — Encounter: Payer: Self-pay | Admitting: Family Medicine

## 2023-01-31 VITALS — BP 103/66 | HR 57 | Temp 98.0°F | Ht 72.0 in | Wt 156.8 lb

## 2023-01-31 DIAGNOSIS — J209 Acute bronchitis, unspecified: Secondary | ICD-10-CM | POA: Diagnosis not present

## 2023-01-31 DIAGNOSIS — H6061 Unspecified chronic otitis externa, right ear: Secondary | ICD-10-CM | POA: Diagnosis not present

## 2023-01-31 NOTE — Patient Instructions (Addendum)
Your ear looks really good. A little red, but no swelling and no drainage. Finish the antibiotics. Your lungs sound great! Everything else looks good.   Call or send a message to me if you run out of your ear drops- I don't want you to have to go to the ER unless there's a big emergency

## 2023-01-31 NOTE — Progress Notes (Signed)
BP 103/66   Pulse (!) 57   Temp 98 F (36.7 C) (Oral)   Ht 6' (1.829 m)   Wt 156 lb 12.8 oz (71.1 kg)   SpO2 98%   BMI 21.27 kg/m    Subjective:    Patient ID: Marcus Wright, male    DOB: [birth year], 67 y.o.   MRN: [id number]  HPI: Marcus Wright is a 67 y.o. male  Chief Complaint  Patient presents with   Bronchitis   Otalgia   ER FOLLOW UP Time since discharge: 7 days Hospital/facility: [facility] Diagnosis: Bronchitis and barotrauma Procedures/tests:  Consultants: None New medications: Ofloxicin ear drops, Keflex and Azithromycin, cough medicine Discharge instructions:  Follow up here in a few days Status: better  Since getting out of the hospital he is feeling better. He ntoes that his throat is not colosing. He notes that he is still haivng drainage coming down his throat that he is coughing up occasionally in the morning. He is otherwise feeling well.      12/21/2022    9:59 AM 05/10/2022   11:25 AM 11/10/2021    1:35 PM 08/05/2021   10:05 AM 08/01/2020   10:35 AM  Depression screen PHQ 2/9  Decreased Interest 3 0 0 0 0  Down, Depressed, Hopeless 0 0 0 0 0  PHQ - 2 Score 3 0 0 0 0  Altered sleeping 0  0 0 0  Tired, decreased energy 0  0 0 0  Change in appetite 0  0 0 0  Feeling bad or failure about yourself  0  0 0 0  Trouble concentrating 0  0 0 0  Moving slowly or fidgety/restless 3  0 0 0  Suicidal thoughts 0  0 0 0  PHQ-9 Score 6  0 0 0  Difficult doing work/chores Not difficult at all   Not difficult at all Not difficult at all      12/21/2022    9:59 AM 11/10/2021    1:35 PM 08/05/2021   10:06 AM 08/01/2020   10:35 AM  GAD 7 : Generalized Anxiety Score  Nervous, Anxious, on Edge 0 0 0 0  Control/stop worrying 0 0 0 0  Worry too much - different things 0 0 0 0  Trouble relaxing 0 0 0 0  Restless 0 0 0 0  Easily annoyed or irritable 0 0 0 0  Afraid - awful might happen 0 0 0 0  Total GAD 7 Score 0 0 0 0  Anxiety Difficulty Not difficult at all   Not difficult at all Not difficult at all      Relevant past medical, surgical, family and social history reviewed and updated as indicated. Interim medical history since our last visit reviewed. Allergies and medications reviewed and updated.  Review of Systems  Constitutional: Negative.   Respiratory: Negative.    Cardiovascular: Negative.   Gastrointestinal: Negative.   Musculoskeletal: Negative.   Psychiatric/Behavioral: Negative.      Per HPI unless specifically indicated above     Objective:    BP 103/66   Pulse (!) 57   Temp 98 F (36.7 C) (Oral)   Ht 6' (1.829 m)   Wt 156 lb 12.8 oz (71.1 kg)   SpO2 98%   BMI 21.27 kg/m   Wt Readings from Last 3 Encounters:  01/31/23 156 lb 12.8 oz (71.1 kg)  01/24/23 140 lb (63.5 kg)  12/21/22 158 lb 14.4 oz (72.1 kg)    Physical  Exam Vitals and nursing note reviewed.  Constitutional:      General: He is not in acute distress.    Appearance: Normal appearance. He is not ill-appearing, toxic-appearing or diaphoretic.  HENT:     Head: Normocephalic and atraumatic.     Right Ear: External ear normal.     Left Ear: Tympanic membrane, ear canal and external ear normal.     Ears:     Comments: Erythematous R EAC, no drainage or swelling or pus    Nose: Nose normal. No congestion or rhinorrhea.     Mouth/Throat:     Mouth: Mucous membranes are moist.     Pharynx: Oropharynx is clear. No oropharyngeal exudate or posterior oropharyngeal erythema.  Eyes:     General: No scleral icterus.       Right eye: No discharge.        Left eye: No discharge.     Extraocular Movements: Extraocular movements intact.     Conjunctiva/sclera: Conjunctivae normal.     Pupils: Pupils are equal, round, and reactive to light.  Cardiovascular:     Rate and Rhythm: Normal rate and regular rhythm.     Pulses: Normal pulses.     Heart sounds: Normal heart sounds. No murmur heard.    No friction rub. No gallop.  Pulmonary:     Effort: Pulmonary  effort is normal. No respiratory distress.     Breath sounds: Normal breath sounds. No stridor. No wheezing, rhonchi or rales.  Chest:     Chest wall: No tenderness.  Musculoskeletal:        General: Normal range of motion.     Cervical back: Normal range of motion and neck supple.  Skin:    General: Skin is warm and dry.     Capillary Refill: Capillary refill takes less than 2 seconds.     Coloration: Skin is not jaundiced or pale.     Findings: No bruising, erythema, lesion or rash.  Neurological:     General: No focal deficit present.     Mental Status: He is alert and oriented to person, place, and time. Mental status is at baseline.  Psychiatric:        Mood and Affect: Mood normal.        Behavior: Behavior normal.        Thought Content: Thought content normal.        Judgment: Judgment normal.     Results for orders placed or performed during the hospital encounter of 01/24/23  CBC  Result Value Ref Range   WBC 8.6 4.0 - 10.5 K/uL   RBC 4.62 4.22 - 5.81 MIL/uL   Hemoglobin 13.1 13.0 - 17.0 g/dL   HCT 40.9 81.1 - 91.4 %   MCV 85.3 80.0 - 100.0 fL   MCH 28.4 26.0 - 34.0 pg   MCHC 33.2 30.0 - 36.0 g/dL   RDW 78.2 95.6 - 21.3 %   Platelets 233 150 - 400 K/uL   nRBC 0.0 0.0 - 0.2 %  Basic metabolic panel  Result Value Ref Range   Sodium 137 135 - 145 mmol/L   Potassium 3.3 (L) 3.5 - 5.1 mmol/L   Chloride 102 98 - 111 mmol/L   CO2 27 22 - 32 mmol/L   Glucose, Bld 108 (H) 70 - 99 mg/dL   BUN 9 8 - 23 mg/dL   Creatinine, Ser 0.86 0.61 - 1.24 mg/dL   Calcium 9.1 8.9 - 57.8 mg/dL   GFR,  Estimated >60 >60 mL/min   Anion gap 8 5 - 15      Assessment & Plan:   Problem List Items Addressed This Visit       Nervous and Auditory   Chronic otitis externa of right ear - Primary   Other Visit Diagnoses     Acute bronchitis, unspecified organism       Finish his antibiotics. Lungs clear. Call with any concerns. Continue to monitor.        Follow up plan: Return  September physical, OK to cancel earlier appts.

## 2023-02-01 ENCOUNTER — Telehealth: Payer: Self-pay

## 2023-02-01 NOTE — Telephone Encounter (Signed)
Transition Care Management Unsuccessful Follow-up Telephone Call  Date of discharge and from where:  01/24/2023 [facility]  Attempts:  1st Attempt  Reason for unsuccessful TCM follow-up call:  No answer/busy  Shiza Thelen [facility]  [facility] Population Health Community Resource Care Guide   [email or web address]  ?? [phone]   Website: [email or web address]

## 2023-02-02 ENCOUNTER — Other Ambulatory Visit: Payer: Medicare Other

## 2023-02-02 DIAGNOSIS — E039 Hypothyroidism, unspecified: Secondary | ICD-10-CM

## 2023-02-03 ENCOUNTER — Telehealth: Payer: Self-pay

## 2023-02-03 ENCOUNTER — Other Ambulatory Visit: Payer: Self-pay | Admitting: Family Medicine

## 2023-02-03 LAB — TSH: TSH: 1.04 u[IU]/mL (ref 0.450–4.500)

## 2023-02-03 MED ORDER — LEVOTHYROXINE SODIUM 50 MCG PO TABS: 50.0000 ug | ORAL_TABLET | Freq: Every day | ORAL | 3 refills | Status: DC

## 2023-02-03 NOTE — Telephone Encounter (Signed)
Transition Care Management Unsuccessful Follow-up Telephone Call  Date of discharge and from where:  01/24/2023 [facility]  Attempts:  2nd Attempt  Reason for unsuccessful TCM follow-up call:  No answer/busy  Davis Ambrosini [facility]  [facility] Population Health Community Resource Care Guide   [email or web address]  ?? [phone]   Website: [email or web address]

## 2023-02-17 ENCOUNTER — Other Ambulatory Visit: Payer: Self-pay | Admitting: Family Medicine

## 2023-02-17 NOTE — Telephone Encounter (Signed)
Dc'd 12/22/22 M. Johnson DO  Requested Prescriptions  Signed Prescriptions Disp Refills   albuterol (VENTOLIN HFA) 108 (90 Base) MCG/ACT inhaler 8.5 each 2    Sig: INHALE 2 PUFFS BY MOUTH EVERY 4 HOURS AS NEEDED FOR WHEEZE OR FOR SHORTNESS OF BREATH     Pulmonology:  Beta Agonists 2 Passed - 02/17/2023 12:17 PM      Passed - Last BP in normal range    BP Readings from Last 1 Encounters:  01/31/23 103/66         Passed - Last Heart Rate in normal range    Pulse Readings from Last 1 Encounters:  01/31/23 (!) 57         Passed - Valid encounter within last 12 months    Recent Outpatient Visits           2 weeks ago Chronic otitis externa of right ear, unspecified type   [place] [facility] [place], [place], DO   1 month ago Primary hypertension   [place] [facility] [place], [place], DO   7 months ago Primary hypertension   [place] [facility] [place], Megan P, DO   9 months ago Encounter for [employer] annual wellness exam   [place] [facility] [place], [place] P, DO   1 year ago Routine general medical examination at a health care facility   [facility] Dorcas Carrow, DO       Future Appointments             In 4 months Laural Benes, Oralia Rud, DO [place] [facility]            Refused Prescriptions Disp Refills   levothyroxine (SYNTHROID) 25 MCG tablet [Pharmacy Med Name: LEVOTHYROXINE 25 MCG TABLET] 90 tablet     Sig: TAKE 1 TABLET BY MOUTH EVERY DAY     Endocrinology:  Hypothyroid Agents Passed - 02/17/2023 12:17 PM      Passed - TSH in normal range and within 360 days    TSH  Date Value Ref Range Status  02/02/2023 1.040 0.450 - 4.500 uIU/mL Final         Passed - Valid encounter within last 12 months    Recent Outpatient Visits           2 weeks ago Chronic otitis externa of right ear, unspecified type   [place] [facility]  [place], Megan P, DO   1 month ago Primary hypertension   [place] [facility] [place], [place], DO   7 months ago Primary hypertension   [place] [facility] [place], Megan P, DO   9 months ago Encounter for [employer] annual wellness exam   [place] [facility] [place], [place] P, DO   1 year ago Routine general medical examination at a health care facility   [facility] Dorcas Carrow, DO       Future Appointments             In 4 months Laural Benes, Oralia Rud, DO [place] [facility], [facility]

## 2023-02-17 NOTE — Telephone Encounter (Signed)
Requested Prescriptions  Pending Prescriptions Disp Refills   levothyroxine (SYNTHROID) 25 MCG tablet [Pharmacy Med Name: LEVOTHYROXINE 25 MCG TABLET] 90 tablet     Sig: TAKE 1 TABLET BY MOUTH EVERY DAY     Endocrinology:  Hypothyroid Agents Passed - 02/17/2023 12:17 PM      Passed - TSH in normal range and within 360 days    TSH  Date Value Ref Range Status  02/02/2023 1.040 0.450 - 4.500 uIU/mL Final         Passed - Valid encounter within last 12 months    Recent Outpatient Visits           2 weeks ago Chronic otitis externa of right ear, unspecified type   [place] [facility] [place], Megan P, DO   1 month ago Primary hypertension   [place] [facility] [place], Megan P, DO   7 months ago Primary hypertension   [place] [facility] [place], Megan P, DO   9 months ago Encounter for [employer] annual wellness exam   [place] [facility] [place], [place] P, DO   1 year ago Routine general medical examination at a health care facility   [facility] [place], [place] P, DO       Future Appointments             In 4 months Laural Benes, Megan P, DO [place] [facility]             albuterol (VENTOLIN HFA) 108 (90 Base) MCG/ACT inhaler [Pharmacy Med Name: ALBUTEROL HFA (PROAIR) INHALER] 8.5 each 2    Sig: INHALE 2 PUFFS BY MOUTH EVERY 4 HOURS AS NEEDED FOR WHEEZE OR FOR SHORTNESS OF BREATH     Pulmonology:  Beta Agonists 2 Passed - 02/17/2023 12:17 PM      Passed - Last BP in normal range    BP Readings from Last 1 Encounters:  01/31/23 103/66         Passed - Last Heart Rate in normal range    Pulse Readings from Last 1 Encounters:  01/31/23 (!) 57         Passed - Valid encounter within last 12 months    Recent Outpatient Visits           2 weeks ago Chronic otitis externa of right ear, unspecified type   [place] [facility] [place], [place], DO    1 month ago Primary hypertension   [place] [facility] [place], [place], DO   7 months ago Primary hypertension   [place] [facility] [place], Megan P, DO   9 months ago Encounter for [employer] annual wellness exam   [facility] [place], [place] P, DO   1 year ago Routine general medical examination at a health care facility   [facility] Dorcas Carrow, DO       Future Appointments             In 4 months Laural Benes, Oralia Rud, DO [place] [facility], [facility]

## 2023-03-29 DIAGNOSIS — H353131 Nonexudative age-related macular degeneration, bilateral, early dry stage: Secondary | ICD-10-CM | POA: Diagnosis not present

## 2023-03-29 DIAGNOSIS — H353221 Exudative age-related macular degeneration, left eye, with active choroidal neovascularization: Secondary | ICD-10-CM | POA: Diagnosis not present

## 2023-03-29 DIAGNOSIS — H2513 Age-related nuclear cataract, bilateral: Secondary | ICD-10-CM | POA: Diagnosis not present

## 2023-05-03 ENCOUNTER — Encounter: Payer: Self-pay | Admitting: Family Medicine

## 2023-05-03 ENCOUNTER — Ambulatory Visit (INDEPENDENT_AMBULATORY_CARE_PROVIDER_SITE_OTHER): Payer: Medicare Other | Admitting: Family Medicine

## 2023-05-03 VITALS — BP 109/73 | HR 73 | Temp 97.8°F | Wt 158.6 lb

## 2023-05-03 DIAGNOSIS — W57XXXA Bitten or stung by nonvenomous insect and other nonvenomous arthropods, initial encounter: Secondary | ICD-10-CM | POA: Diagnosis not present

## 2023-05-03 DIAGNOSIS — S30861A Insect bite (nonvenomous) of abdominal wall, initial encounter: Secondary | ICD-10-CM

## 2023-05-03 MED ORDER — MUPIROCIN 2 % EX OINT: 1.0000 | TOPICAL_OINTMENT | Freq: Two times a day (BID) | CUTANEOUS | 0 refills | Status: DC

## 2023-05-03 NOTE — Progress Notes (Signed)
BP 109/73   Pulse 73   Temp 97.8 F (36.6 C) (Oral)   Wt 158 lb 9.6 oz (71.9 kg)   BMI 21.51 kg/m    Subjective:    Patient ID: Marcus Wright, male    DOB: 02/20/1956, 67 y.o.   MRN: [id number]  HPI: Marcus Wright is a 67 y.o. male  Chief Complaint  Patient presents with   Insect Bite    Pt states its been there for abotu 2-3 weeks   SKIN INFECTION Duration: 2-3 weeks Location: abdomen History of trauma in area: thinks he got bit by a spider Pain: no Quality: itchy Severity: moderate Redness: yes Swelling: no Oozing: no Pus: no Fevers: no Nausea/vomiting: no Status: stable Treatments attempted:none  Tetanus: UTD  Relevant past medical, surgical, family and social history reviewed and updated as indicated. Interim medical history since our last visit reviewed. Allergies and medications reviewed and updated.  Review of Systems  Constitutional: Negative.   Respiratory: Negative.    Cardiovascular: Negative.   Gastrointestinal: Negative.   Musculoskeletal: Negative.   Skin:  Positive for rash. Negative for color change, pallor and wound.  Psychiatric/Behavioral: Negative.      Per HPI unless specifically indicated above     Objective:    BP 109/73   Pulse 73   Temp 97.8 F (36.6 C) (Oral)   Wt 158 lb 9.6 oz (71.9 kg)   BMI 21.51 kg/m   Wt Readings from Last 3 Encounters:  05/03/23 158 lb 9.6 oz (71.9 kg)  01/31/23 156 lb 12.8 oz (71.1 kg)  01/24/23 140 lb (63.5 kg)    Physical Exam Vitals and nursing note reviewed.  Constitutional:      General: He is not in acute distress.    Appearance: Normal appearance. He is not ill-appearing, toxic-appearing or diaphoretic.  HENT:     Head: Normocephalic and atraumatic.     Right Ear: External ear normal.     Left Ear: External ear normal.     Nose: Nose normal.     Mouth/Throat:     Mouth: Mucous membranes are moist.     Pharynx: Oropharynx is clear.  Eyes:     General: No scleral icterus.        Right eye: No discharge.        Left eye: No discharge.     Extraocular Movements: Extraocular movements intact.     Conjunctiva/sclera: Conjunctivae normal.     Pupils: Pupils are equal, round, and reactive to light.  Cardiovascular:     Rate and Rhythm: Normal rate and regular rhythm.     Pulses: Normal pulses.     Heart sounds: Normal heart sounds. No murmur heard.    No friction rub. No gallop.  Pulmonary:     Effort: Pulmonary effort is normal. No respiratory distress.     Breath sounds: Normal breath sounds. No stridor. No wheezing, rhonchi or rales.  Chest:     Chest wall: No tenderness.  Musculoskeletal:        General: Normal range of motion.     Cervical back: Normal range of motion and neck supple.  Skin:    General: Skin is warm and dry.     Capillary Refill: Capillary refill takes less than 2 seconds.     Coloration: Skin is not jaundiced or pale.     Findings: No bruising, erythema, lesion or rash.     Comments: Irritated bite on abdomen with scab  Neurological:  General: No focal deficit present.     Mental Status: He is alert and oriented to person, place, and time. Mental status is at baseline.  Psychiatric:        Mood and Affect: Mood normal.        Behavior: Behavior normal.        Thought Content: Thought content normal.        Judgment: Judgment normal.     Results for orders placed or performed in visit on 05/03/23  Spotted Fever Group Antibodies  Result Value Ref Range   Spotted Fever Group IgG Comment Neg:<1:64   Spotted Fever Group IgM <1:64 Neg:<1:64   Result Comment Comment   Lyme Disease Serology w/Reflex  Result Value Ref Range   Lyme Total Antibody EIA Negative Negative      Assessment & Plan:   Problem List Items Addressed This Visit   None Visit Diagnoses     Insect bite of abdominal wall, initial encounter    -  Primary   Will treat with bactroban and check for tick diseases. Call with any concerns. Continue to monitor.    Relevant Orders   Spotted Fever Group Antibodies (Completed)   Lyme Disease Serology w/Reflex (Completed)        Follow up plan: Return as scheduled for september.

## 2023-05-05 ENCOUNTER — Encounter: Payer: Self-pay | Admitting: Family Medicine

## 2023-05-12 LAB — LYME DISEASE SEROLOGY W/REFLEX: Lyme Total Antibody EIA: NEGATIVE

## 2023-05-31 DIAGNOSIS — H353221 Exudative age-related macular degeneration, left eye, with active choroidal neovascularization: Secondary | ICD-10-CM | POA: Diagnosis not present

## 2023-06-11 ENCOUNTER — Other Ambulatory Visit: Payer: Self-pay | Admitting: Family Medicine

## 2023-06-13 ENCOUNTER — Other Ambulatory Visit: Payer: Self-pay | Admitting: Family Medicine

## 2023-06-13 NOTE — Telephone Encounter (Signed)
Requested Prescriptions  Pending Prescriptions Disp Refills   rosuvastatin (CRESTOR) 20 MG tablet [Pharmacy Med Name: ROSUVASTATIN CALCIUM 20 MG TAB] 90 tablet 1    Sig: TAKE 1 TABLET BY MOUTH EVERY DAY     Cardiovascular:  Antilipid - Statins 2 Failed - 06/11/2023  8:46 AM      Failed - Lipid Panel in normal range within the last 12 months    Cholesterol, Total  Date Value Ref Range Status  12/21/2022 155 100 - 199 mg/dL Final   Cholesterol Piccolo, Waived  Date Value Ref Range Status  05/21/2016 251 (H) <200 mg/dL Final    Comment:                            Desirable                <200                         Borderline High      200- 239                         High                     >239    LDL Chol Calc (NIH)  Date Value Ref Range Status  12/21/2022 85 0 - 99 mg/dL Final   HDL  Date Value Ref Range Status  12/21/2022 51 >39 mg/dL Final   Triglycerides  Date Value Ref Range Status  12/21/2022 102 0 - 149 mg/dL Final   Triglycerides Piccolo,Waived  Date Value Ref Range Status  05/21/2016 292 (H) <150 mg/dL Final    Comment:                            Normal                   <150                         Borderline High     150 - 199                         High                200 - 499                         Very High                >499          Passed - Cr in normal range and within 360 days    Creatinine  Date Value Ref Range Status  07/18/2012 1.03 0.60 - 1.30 mg/dL Final   Creatinine, Ser  Date Value Ref Range Status  01/24/2023 0.79 0.61 - 1.24 mg/dL Final         Passed - Patient is not pregnant      Passed - Valid encounter within last 12 months    Recent Outpatient Visits           1 month ago Insect bite of abdominal wall, initial encounter   [place] [facility] [place], Megan P, DO   4 months ago Chronic otitis externa of  right ear, unspecified type   [place] [facility] [place], Megan P, DO   5  months ago Primary hypertension   [place] [facility] [place], [place], [place]   11 months ago Primary hypertension   [place] [facility] [place], [place], DO   1 year ago Encounter for [employer] annual wellness exam   [place] [facility] Dorcas Carrow, DO       Future Appointments             In 3 days Dorcas Carrow, DO [facility]

## 2023-06-15 NOTE — Telephone Encounter (Signed)
Requested medication (s) are due for refill today: yes  Requested medication (s) are on the active medication list: yes  Last refill:  02/17/23 #8.5 2 refills  Future visit scheduled: yes tomorrow  Notes to clinic:   do you want to refill Rx or wait until tomorrow's OV ?     Requested Prescriptions  Pending Prescriptions Disp Refills   albuterol (VENTOLIN HFA) 108 (90 Base) MCG/ACT inhaler [Pharmacy Med Name: ALBUTEROL HFA (PROAIR) INHALER] 8.5 each 2    Sig: INHALE 2 PUFFS BY MOUTH EVERY 4 HOURS AS NEEDED FOR WHEEZE OR FOR SHORTNESS OF BREATH     Pulmonology:  Beta Agonists 2 Passed - 06/13/2023  6:48 PM      Passed - Last BP in normal range    BP Readings from Last 1 Encounters:  05/03/23 109/73         Passed - Last Heart Rate in normal range    Pulse Readings from Last 1 Encounters:  05/03/23 73         Passed - Valid encounter within last 12 months    Recent Outpatient Visits           1 month ago Insect bite of abdominal wall, initial encounter   [place] [facility] [place], Megan P, DO   4 months ago Chronic otitis externa of right ear, unspecified type   [place] [facility] [place], Megan P, DO   5 months ago Primary hypertension   [place] [facility] [place], [place], DO   11 months ago Primary hypertension   [place] [facility] [place], [place], DO   1 year ago Encounter for [employer] annual wellness exam   [place] [facility] [place], Oralia Rud, DO       Future Appointments             Tomorrow Dorcas Carrow, DO  [facility]

## 2023-06-16 ENCOUNTER — Encounter: Payer: Self-pay | Admitting: Family Medicine

## 2023-06-16 ENCOUNTER — Ambulatory Visit (INDEPENDENT_AMBULATORY_CARE_PROVIDER_SITE_OTHER): Payer: Medicare Other | Admitting: Family Medicine

## 2023-06-16 VITALS — BP 147/70 | HR 65 | Ht 72.0 in | Wt 160.8 lb

## 2023-06-16 DIAGNOSIS — Z23 Encounter for immunization: Secondary | ICD-10-CM | POA: Diagnosis not present

## 2023-06-16 DIAGNOSIS — Z0000 Encounter for general adult medical examination without abnormal findings: Secondary | ICD-10-CM | POA: Diagnosis not present

## 2023-06-16 DIAGNOSIS — I10 Essential (primary) hypertension: Secondary | ICD-10-CM | POA: Diagnosis not present

## 2023-06-16 DIAGNOSIS — Z8522 Personal history of malignant neoplasm of nasal cavities, middle ear, and accessory sinuses: Secondary | ICD-10-CM

## 2023-06-16 DIAGNOSIS — R3911 Hesitancy of micturition: Secondary | ICD-10-CM

## 2023-06-16 DIAGNOSIS — E782 Mixed hyperlipidemia: Secondary | ICD-10-CM | POA: Diagnosis not present

## 2023-06-16 DIAGNOSIS — E039 Hypothyroidism, unspecified: Secondary | ICD-10-CM

## 2023-06-16 LAB — URINALYSIS, ROUTINE W REFLEX MICROSCOPIC
Bilirubin, UA: NEGATIVE
Glucose, UA: NEGATIVE
Ketones, UA: NEGATIVE
Leukocytes,UA: NEGATIVE
Nitrite, UA: NEGATIVE
Protein,UA: NEGATIVE
RBC, UA: NEGATIVE
Specific Gravity, UA: 1.02 (ref 1.005–1.030)
Urobilinogen, Ur: 0.2 mg/dL (ref 0.2–1.0)
pH, UA: 7 (ref 5.0–7.5)

## 2023-06-16 LAB — MICROALBUMIN, URINE WAIVED
Creatinine, Urine Waived: 100 mg/dL (ref 10–300)
Microalb, Ur Waived: 30 mg/L — ABNORMAL HIGH (ref 0–19)
Microalb/Creat Ratio: <30 mg/g (ref <30)

## 2023-06-16 NOTE — Patient Instructions (Addendum)
You have an appointment with your ENT doctor in [place] about why that node is bigger and painful.   08/01/23 at 10:15  Dr. Marene Lenz-  [address]  [address]  [place], [place] [address]   Preventative Services:  Health Risk Assessment and Personalized Prevention Plan: Done today Bone Mass Measurements: N/A CVD Screening: done today Colon Cancer Screening: up to date Depression Screening: done today Diabetes Screening: done today Glaucoma Screening: see your eye doctor Hepatitis B vaccine: N/A Hepatitis C screening: up to date HIV Screening: up to date Flu Vaccine: given today Lung cancer Screening: N/A Obesity Screening: done today Pneumonia Vaccines (2): up to date STI Screening: N/A PSA screening: Done today.

## 2023-06-16 NOTE — Progress Notes (Signed)
BP (!) 147/70   Pulse 65   Ht 6' (1.829 m)   Wt 160 lb 12.8 oz (72.9 kg)   SpO2 98%   BMI 21.81 kg/m    Subjective:    Patient ID: Marcus Wright, male    DOB: [birth year], 67 y.o.   MRN: [id number]  HPI: Marcus Wright is a 67 y.o. male presenting on 06/16/2023 for comprehensive medical examination. Current medical complaints include:  HYPOTHYROIDISM Thyroid control status:controlled Satisfied with current treatment? yes Medication side effects: no Medication compliance: excellent compliance Recent dose adjustment:no Fatigue: no Cold intolerance: no Heat intolerance: no Weight gain: no Weight loss: no Constipation: no Diarrhea/loose stools: no Palpitations: no Lower extremity edema: no Anxiety/depressed mood: no  HYPERLIPIDEMIA Hyperlipidemia status: excellent compliance Satisfied with current treatment?  yes Side effects:  no Medication compliance: excellent compliance Past cholesterol meds: crestor Supplements: none Aspirin:  no The 10-year ASCVD risk score (Arnett DK, et al., 2019) is: 16.9%   Values used to calculate the score:     Age: 36 years     Sex: Male     Is Non-Hispanic African American: No     Diabetic: No     Tobacco smoker: No     Systolic Blood Pressure: 147 mmHg     Is BP treated: No     HDL Cholesterol: 51 mg/dL     Total Cholesterol: 155 mg/dL Chest pain:  no Coronary artery disease:  no  Has noticed some swelling and enlargement of his lymph node on the R side for the past 2 weeks. Is not feeling sick. No drainage from his ear. Unsure when was the last time saw his ENT doctor. No other concerns at this time.   Interim Problems from his last visit: no  Functional Status Survey: Is the patient deaf or have difficulty hearing?: Yes Does the patient have difficulty seeing, even when wearing glasses/contacts?: Yes Does the patient have difficulty concentrating, remembering, or making decisions?: No Does the patient have difficulty  walking or climbing stairs?: No Does the patient have difficulty dressing or bathing?: No Does the patient have difficulty doing errands alone such as visiting a doctor's office or shopping?: No  FALL RISK:    06/16/2023    9:05 AM 05/03/2023    9:47 AM 12/21/2022    9:58 AM 05/10/2022   11:25 AM 11/10/2021    1:32 PM  Fall Risk   Falls in the past year? 0 0 0 1 0  Number falls in past yr: 0 0 0 0 0  Injury with Fall? 0 0 0 0 0  Risk for fall due to : No Fall Risks No Fall Risks No Fall Risks History of fall(s) No Fall Risks  Follow up Falls evaluation completed Falls evaluation completed Falls evaluation completed Falls evaluation completed Falls evaluation completed    Depression Screen    06/16/2023    9:05 AM 05/03/2023    9:47 AM 12/21/2022    9:59 AM 05/10/2022   11:25 AM 11/10/2021    1:35 PM  Depression screen PHQ 2/9  Decreased Interest 0 0 3 0 0  Down, Depressed, Hopeless 0 0 0 0 0  PHQ - 2 Score 0 0 3 0 0  Altered sleeping 0 0 0  0  Tired, decreased energy 0 0 0  0  Change in appetite 0 0 0  0  Feeling bad or failure about yourself  0 0 0  0  Trouble concentrating 0  0 0  0  Moving slowly or fidgety/restless 0 0 3  0  Suicidal thoughts 0 0 0  0  PHQ-9 Score 0 0 6  0  Difficult doing work/chores  Not difficult at all Not difficult at all      Advanced Directives Does patient have a HCPOA?    yes If yes, name and contact information:  Does patient have a living will or MOST form?  yes  Past Medical History:  Past Medical History:  Diagnosis Date   Cancer (HCC) 2003   skin'   Hyperlipidemia    Hypertension    Hypothyroidism    Thyroid disorder    Wears dentures    full upper   Wears hearing aid     Surgical History:  Past Surgical History:  Procedure Laterality Date   BACK SURGERY  2011   BREAST SURGERY Right 11-22-12   CHOLECYSTECTOMY     COLONOSCOPY     COLONOSCOPY WITH PROPOFOL N/A 07/19/2016   Procedure: COLONOSCOPY WITH PROPOFOL;  Surgeon: Midge Minium, MD;  Location: [facility];  Service: Endoscopy;  Laterality: N/A;   REPLACEMENT TOTAL KNEE Right 1993   SINUS ENDO W/FUSION Right 09/03/2020   Procedure: Removal of Right nasopharyngeal mass, Biopsy and frozen sections;  Surgeon: Laren Boom, DO;  Location: [facility] OR;  Service: ENT;  Laterality: Right;   SINUS SURGERY WITH INSTATRAK  2003   cancer    Medications:  Current Outpatient Medications on File Prior to Visit  Medication Sig   fluticasone (FLONASE) 50 MCG/ACT nasal spray SPRAY 2 SPRAYS INTO EACH NOSTRIL EVERY DAY   levothyroxine (SYNTHROID) 50 MCG tablet Take 1 tablet (50 mcg total) by mouth daily.   mupirocin ointment (BACTROBAN) 2 % Apply 1 Application topically 2 (two) times daily.   ofloxacin (OCUFLOX) 0.3 % ophthalmic solution Place 3 drops in the right ear 3 times daily for the next 7 days   rosuvastatin (CRESTOR) 20 MG tablet TAKE 1 TABLET BY MOUTH EVERY DAY   albuterol (VENTOLIN HFA) 108 (90 Base) MCG/ACT inhaler INHALE 2 PUFFS BY MOUTH EVERY 4 HOURS AS NEEDED FOR WHEEZE OR FOR SHORTNESS OF BREATH   No current facility-administered medications on file prior to visit.    Allergies:  No Known Allergies  Social History:  Social History   Socioeconomic History   Marital status: Widowed    Spouse name: Not on file   Number of children: Not on file   Years of education: Not on file   Highest education level: 8th grade  Occupational History   Not on file  Tobacco Use   Smoking status: Former   Smokeless tobacco: Never   Tobacco comments:    quit 20+ yrs ago  Vaping Use   Vaping status: Never Used  Substance and Sexual Activity   Alcohol use: Yes    Comment: on occasion, 1-2 beers or wine occ.    Drug use: No   Sexual activity: Not Currently  Other Topics Concern   Not on file  Social History Narrative   Not on file   Social Determinants of Health   Financial Resource Strain: Low Risk  (04/29/2023)   Overall Financial Resource Strain  (CARDIA)    Difficulty of Paying Living Expenses: Not hard at all  Food Insecurity: No Food Insecurity (04/29/2023)   Hunger Vital Sign    Worried About Running Out of Food in the Last Year: Never true    Ran Out of Food in  the Last Year: Never true  Transportation Needs: No Transportation Needs (04/29/2023)   PRAPARE - Administrator, Civil Service (Medical): No    Lack of Transportation (Non-Medical): No  Physical Activity: Insufficiently Active (04/29/2023)   Exercise Vital Sign    Days of Exercise per Week: 1 day    Minutes of Exercise per Session: 60 min  Stress: No Stress Concern Present (04/29/2023)   [employer] of Occupational Health - Occupational Stress Questionnaire    Feeling of Stress : Not at all  Social Connections: Socially Isolated (04/29/2023)   Social Connection and Isolation Panel [NHANES]    Frequency of Communication with Friends and Family: More than three times a week    Frequency of Social Gatherings with Friends and Family: More than three times a week    Attends Religious Services: Never    Database administrator or Organizations: No    Attends Banker Meetings: Not on file    Marital Status: Widowed  Catering manager Violence: Not on file   Social History   Tobacco Use  Smoking Status Former  Smokeless Tobacco Never  Tobacco Comments   quit 20+ yrs ago   Social History   Substance and Sexual Activity  Alcohol Use Yes   Comment: on occasion, 1-2 beers or wine occ.     Family History:  History reviewed. No pertinent family history.  Past medical history, surgical history, medications, allergies, family history and social history reviewed with patient today and changes made to appropriate areas of the chart.   Review of Systems  Constitutional: Negative.   HENT:  Positive for ear discharge, ear pain and hearing loss. Negative for congestion, nosebleeds, sinus pain, sore throat and tinnitus.   Eyes: Negative.    Respiratory:  Positive for cough. Negative for hemoptysis, sputum production, shortness of breath, wheezing and stridor.   Cardiovascular: Negative.   Gastrointestinal: Negative.   Genitourinary: Negative.   Musculoskeletal: Negative.   Skin: Negative.   Neurological: Negative.   Endo/Heme/Allergies: Negative.   Psychiatric/Behavioral: Negative.     All other ROS negative except what is listed above and in the HPI.      Objective:    BP (!) 147/70   Pulse 65   Ht 6' (1.829 m)   Wt 160 lb 12.8 oz (72.9 kg)   SpO2 98%   BMI 21.81 kg/m   Wt Readings from Last 3 Encounters:  06/16/23 160 lb 12.8 oz (72.9 kg)  05/03/23 158 lb 9.6 oz (71.9 kg)  01/31/23 156 lb 12.8 oz (71.1 kg)     Physical Exam Vitals and nursing note reviewed.  Constitutional:      General: He is not in acute distress.    Appearance: Normal appearance. He is not ill-appearing, toxic-appearing or diaphoretic.  HENT:     Head: Normocephalic and atraumatic.     Right Ear: Tympanic membrane, ear canal and external ear normal. There is no impacted cerumen.     Left Ear: Tympanic membrane, ear canal and external ear normal. There is no impacted cerumen.     Nose: Nose normal. No congestion or rhinorrhea.     Mouth/Throat:     Mouth: Mucous membranes are moist.     Pharynx: Oropharynx is clear. Posterior oropharyngeal erythema (posterior pharynx red, no sign of exudate or swelling) present. No oropharyngeal exudate.  Eyes:     General: No scleral icterus.       Right eye: No discharge.  Left eye: No discharge.     Extraocular Movements: Extraocular movements intact.     Conjunctiva/sclera: Conjunctivae normal.     Pupils: Pupils are equal, round, and reactive to light.  Neck:     Vascular: No carotid bruit.   Cardiovascular:     Rate and Rhythm: Normal rate and regular rhythm.     Pulses: Normal pulses.     Heart sounds: No murmur heard.    No friction rub. No gallop.  Pulmonary:     Effort:  Pulmonary effort is normal. No respiratory distress.     Breath sounds: Normal breath sounds. No stridor. No wheezing, rhonchi or rales.  Chest:     Chest wall: No tenderness.  Abdominal:     General: Abdomen is flat. Bowel sounds are normal. There is no distension.     Palpations: Abdomen is soft. There is no mass.     Tenderness: There is no abdominal tenderness. There is no right CVA tenderness, left CVA tenderness, guarding or rebound.     Hernia: No hernia is present.  Genitourinary:    Comments: Genital exam deferred with shared decision making Musculoskeletal:        General: No swelling, tenderness, deformity or signs of injury.     Cervical back: Normal range of motion and neck supple. No rigidity. No muscular tenderness.     Right lower leg: No edema.     Left lower leg: No edema.  Lymphadenopathy:     Cervical: No cervical adenopathy.  Skin:    General: Skin is warm and dry.     Capillary Refill: Capillary refill takes less than 2 seconds.     Coloration: Skin is not jaundiced or pale.     Findings: No bruising, erythema, lesion or rash.  Neurological:     General: No focal deficit present.     Mental Status: He is alert and oriented to person, place, and time.     Cranial Nerves: No cranial nerve deficit.     Sensory: No sensory deficit.     Motor: No weakness.     Coordination: Coordination normal.     Gait: Gait normal.     Deep Tendon Reflexes: Reflexes normal.  Psychiatric:        Mood and Affect: Mood normal.        Behavior: Behavior normal.        Thought Content: Thought content normal.        Judgment: Judgment normal.        06/16/2023    9:28 AM 05/10/2022   11:28 AM 06/03/2020    9:40 AM 05/22/2018    9:11 AM 12/20/2016   10:12 AM  6CIT Screen  What Year? 0 points 0 points 4 points 4 points 0 points  What month? 0 points 0 points 0 points 0 points 0 points  What time? 0 points 0 points 3 points 0 points 0 points  Count back from 20 0 points 0  points 0 points 0 points 0 points  Months in reverse 4 points 0 points 2 points 2 points 2 points  Repeat phrase 0 points 2 points 2 points 2 points 4 points  Total Score 4 points 2 points 11 points 8 points 6 points    Results for orders placed or performed in visit on 06/16/23  Urinalysis, Routine w reflex microscopic  Result Value Ref Range   Specific Gravity, UA 1.020 1.005 - 1.030   pH, UA 7.0 5.0 -  7.5   Color, UA Yellow Yellow   Appearance Ur Clear Clear   Leukocytes,UA Negative Negative   Protein,UA Negative Negative/Trace   Glucose, UA Negative Negative   Ketones, UA Negative Negative   RBC, UA Negative Negative   Bilirubin, UA Negative Negative   Urobilinogen, Ur 0.2 0.2 - 1.0 mg/dL   Nitrite, UA Negative Negative   Microscopic Examination Comment   Microalbumin, Urine Waived  Result Value Ref Range   Microalb, Ur Waived 30 (H) 0 - 19 mg/L   Creatinine, Urine Waived 100 10 - 300 mg/dL   Microalb/Creat Ratio <30 <30 mg/g      Assessment & Plan:   Problem List Items Addressed This Visit       Cardiovascular and Mediastinum   Hypertension    Running a little high today. Will work on [employer] and recheck next visit. Call with any concerns.       Relevant Orders   Comprehensive metabolic panel   CBC with Differential/Platelet   Microalbumin, Urine Waived (Completed)     Endocrine   Hypothyroidism    Rechecking labs today. Await results. Treat as needed.       Relevant Orders   Comprehensive metabolic panel   CBC with Differential/Platelet   TSH     Other   History of sinus cancer    Has had enlargement of his lymph node on R side. Not feeling sick. Exam non-discript today with only a little bit of oropharyngeal erythema. Has not seen ENT since 2022. Appointment scheduled with them- will get him back into see them.       Hyperlipidemia    Under good control on current regimen. Continue current regimen. Continue to monitor. Call with any concerns.  Refills given. Labs drawn today.        Relevant Orders   Comprehensive metabolic panel   CBC with Differential/Platelet   Lipid Panel w/o Chol/HDL Ratio   Other Visit Diagnoses     Encounter for Medicare annual wellness exam    -  Primary   Preventative care discussed today as below.   Routine general medical examination at a health care facility       Vaccines up to date. Screening labs checked today. Colonoscopy up to date. Continue diet and exercise. Call with any concerns.   Hesitancy       Relevant Orders   Comprehensive metabolic panel   CBC with Differential/Platelet   PSA   Urinalysis, Routine w reflex microscopic (Completed)   Needs flu shot       Flu shot given today.   Relevant Orders   Flu Vaccine Trivalent High Dose (Fluad) (Completed)        Preventative Services:  Health Risk Assessment and Personalized Prevention Plan: Done today Bone Mass Measurements: N/A CVD Screening: done today Colon Cancer Screening: up to date Depression Screening: done today Diabetes Screening: done today Glaucoma Screening: see your eye doctor Hepatitis B vaccine: N/A Hepatitis C screening: up to date HIV Screening: up to date Flu Vaccine: given today Lung cancer Screening: N/A Obesity Screening: done today Pneumonia Vaccines (2): up to date STI Screening: N/A PSA screening: Done today.   LABORATORY TESTING:  Health maintenance labs ordered today as discussed above.   The natural history of prostate cancer and ongoing controversy regarding screening and potential treatment outcomes of prostate cancer has been discussed with the patient. The meaning of a false positive PSA and a false negative PSA has been discussed. He indicates  understanding of the limitations of this screening test and wishes  to proceed with screening PSA testing.   IMMUNIZATIONS:   - Tdap: Tetanus vaccination status reviewed: last tetanus booster within 10 years. - Influenza: Administered today -  Pneumovax: Up to date - Prevnar: Up to date - Zostavax vaccine: Refused  SCREENING: - Colonoscopy: Up to date  Discussed with patient purpose of the colonoscopy is to detect colon cancer at curable precancerous or early stages    PATIENT COUNSELING:    Sexuality: Discussed sexually transmitted diseases, partner selection, use of condoms, avoidance of unintended pregnancy  and contraceptive alternatives.   Advised to avoid cigarette smoking.  I discussed with the patient that most people either abstain from alcohol or drink within safe limits (<=14/week and <=4 drinks/occasion for males, <=7/weeks and <= 3 drinks/occasion for females) and that the risk for alcohol disorders and other health effects rises proportionally with the number of drinks per week and how often a drinker exceeds daily limits.  Discussed cessation/primary prevention of drug use and availability of treatment for abuse.   Diet: Encouraged to adjust caloric intake to maintain  or achieve ideal body weight, to reduce intake of dietary saturated fat and total fat, to limit sodium intake by avoiding high sodium foods and not adding table salt, and to maintain adequate dietary potassium and calcium preferably from fresh fruits, vegetables, and low-fat dairy products.    stressed the importance of regular exercise  Injury prevention: Discussed safety belts, safety helmets, smoke detector, smoking near bedding or upholstery.   Dental health: Discussed importance of regular tooth brushing, flossing, and dental visits.   Follow up plan: NEXT PREVENTATIVE PHYSICAL DUE IN 1 YEAR. Return in about 6 months (around 12/14/2023).

## 2023-06-16 NOTE — Assessment & Plan Note (Signed)
Rechecking labs today. Await results. Treat as needed.  °

## 2023-06-16 NOTE — Assessment & Plan Note (Signed)
Running a little high today. Will work on [employer] and recheck next visit. Call with any concerns.

## 2023-06-16 NOTE — Assessment & Plan Note (Signed)
Has had enlargement of his lymph node on R side. Not feeling sick. Exam non-discript today with only a little bit of oropharyngeal erythema. Has not seen ENT since 2022. Appointment scheduled with them- will get him back into see them.

## 2023-06-16 NOTE — Assessment & Plan Note (Signed)
Under good control on current regimen. Continue current regimen. Continue to monitor. Call with any concerns. Refills given. Labs drawn today.   

## 2023-06-17 LAB — COMPREHENSIVE METABOLIC PANEL
ALT: 14 IU/L (ref 0–44)
AST: 21 IU/L (ref 0–40)
Albumin: 4.6 g/dL (ref 3.9–4.9)
Alkaline Phosphatase: 72 IU/L (ref 44–121)
BUN/Creatinine Ratio: 11 (ref 10–24)
BUN: 10 mg/dL (ref 8–27)
Bilirubin Total: 0.6 mg/dL (ref 0.0–1.2)
CO2: 26 mmol/L (ref 20–29)
Calcium: 9.6 mg/dL (ref 8.6–10.2)
Chloride: 99 mmol/L (ref 96–106)
Creatinine, Ser: 0.89 mg/dL (ref 0.76–1.27)
Globulin, Total: 2.1 g/dL (ref 1.5–4.5)
Glucose: 83 mg/dL (ref 70–99)
Potassium: 4.3 mmol/L (ref 3.5–5.2)
Sodium: 141 mmol/L (ref 134–144)
Total Protein: 6.7 g/dL (ref 6.0–8.5)
eGFR: 93 mL/min/{1.73_m2} (ref >59)

## 2023-06-17 LAB — CBC WITH DIFFERENTIAL/PLATELET
Basophils Absolute: 0.1 10*3/uL (ref 0.0–0.2)
Basos: 1 %
EOS (ABSOLUTE): 0.3 10*3/uL (ref 0.0–0.4)
Eos: 5 %
Hematocrit: 41.8 % (ref 37.5–51.0)
Hemoglobin: 13.5 g/dL (ref 13.0–17.7)
Immature Grans (Abs): 0 10*3/uL (ref 0.0–0.1)
Immature Granulocytes: 0 %
Lymphocytes Absolute: 2.2 10*3/uL (ref 0.7–3.1)
Lymphs: 40 %
MCH: 28.1 pg (ref 26.6–33.0)
MCHC: 32.3 g/dL (ref 31.5–35.7)
MCV: 87 fL (ref 79–97)
Monocytes Absolute: 0.5 10*3/uL (ref 0.1–0.9)
Monocytes: 9 %
Neutrophils Absolute: 2.5 10*3/uL (ref 1.4–7.0)
Neutrophils: 45 %
Platelets: 255 10*3/uL (ref 150–450)
RBC: 4.8 x10E6/uL (ref 4.14–5.80)
RDW: 13.1 % (ref 11.6–15.4)
WBC: 5.5 10*3/uL (ref 3.4–10.8)

## 2023-06-17 LAB — TSH: TSH: 0.68 u[IU]/mL (ref 0.450–4.500)

## 2023-06-17 LAB — PSA: Prostate Specific Ag, Serum: 0.3 ng/mL (ref 0.0–4.0)

## 2023-06-17 LAB — LIPID PANEL W/O CHOL/HDL RATIO
Cholesterol, Total: 153 mg/dL (ref 100–199)
HDL: 56 mg/dL (ref >39)
LDL Chol Calc (NIH): 81 mg/dL (ref 0–99)
Triglycerides: 87 mg/dL (ref 0–149)
VLDL Cholesterol Cal: 16 mg/dL (ref 5–40)

## 2023-06-23 ENCOUNTER — Ambulatory Visit: Payer: Medicare Other | Admitting: Family Medicine

## 2023-08-22 DIAGNOSIS — H353221 Exudative age-related macular degeneration, left eye, with active choroidal neovascularization: Secondary | ICD-10-CM | POA: Diagnosis not present

## 2023-11-14 DIAGNOSIS — B379 Candidiasis, unspecified: Secondary | ICD-10-CM | POA: Diagnosis not present

## 2023-11-14 DIAGNOSIS — H6121 Impacted cerumen, right ear: Secondary | ICD-10-CM | POA: Diagnosis not present

## 2023-11-14 DIAGNOSIS — R070 Pain in throat: Secondary | ICD-10-CM | POA: Diagnosis not present

## 2023-11-15 ENCOUNTER — Other Ambulatory Visit: Payer: Self-pay | Admitting: Family Medicine

## 2023-11-16 NOTE — Telephone Encounter (Signed)
Requested Prescriptions  Pending Prescriptions Disp Refills   albuterol (VENTOLIN HFA) 108 (90 Base) MCG/ACT inhaler [Pharmacy Med Name: ALBUTEROL HFA (PROAIR) INHALER] 8.5 each 0    Sig: INHALE 2 PUFFS BY MOUTH EVERY 4 HOURS AS NEEDED FOR WHEEZE OR FOR SHORTNESS OF BREATH     Pulmonology:  Beta Agonists 2 Failed - 11/16/2023 11:38 AM      Failed - Last BP in normal range    BP Readings from Last 1 Encounters:  06/16/23 (!) 147/70         Passed - Last Heart Rate in normal range    Pulse Readings from Last 1 Encounters:  06/16/23 65         Passed - Valid encounter within last 12 months    Recent Outpatient Visits           5 months ago Encounter for [employer] annual wellness exam   [place] [facility] [place], Megan P, DO   6 months ago Insect bite of abdominal wall, initial encounter   [place] [facility] [place], Megan P, DO   9 months ago Chronic otitis externa of right ear, unspecified type   [facility] [place], Megan P, DO   11 months ago Primary hypertension   [place] [facility] [place], [place], DO   1 year ago Primary hypertension   [place] [facility] [place], [place], DO       Future Appointments             In 4 weeks Laural Benes, Oralia Rud, DO [place] [facility]             mupirocin ointment (BACTROBAN) 2 % [Pharmacy Med Name: MUPIROCIN 2% OINTMENT] 22 g 0    Sig: APPLY TO AFFECTED AREA TWICE A DAY     Off-Protocol Failed - 11/16/2023 11:38 AM      Failed - Medication not assigned to a protocol, review manually.      Passed - Valid encounter within last 12 months    Recent Outpatient Visits           5 months ago Encounter for [employer] annual wellness exam   [place] [facility] [place], [place] P, DO   6 months ago Insect bite of abdominal wall, initial encounter   [place] [facility], Megan P, DO    9 months ago Chronic otitis externa of right ear, unspecified type   [place] [facility] [place], Megan P, DO   11 months ago Primary hypertension   [facility] [place], [place], DO   1 year ago Primary hypertension   [place] [facility] [place], [place], DO       Future Appointments             In 4 weeks Laural Benes, Oralia Rud, DO  [facility]             rosuvastatin (CRESTOR) 20 MG tablet [Pharmacy Med Name: ROSUVASTATIN CALCIUM 20 MG TAB] 90 tablet 0    Sig: TAKE 1 TABLET BY MOUTH EVERY DAY     Cardiovascular:  Antilipid - Statins 2 Failed - 11/16/2023 11:38 AM      Failed - Lipid Panel in normal range within the last 12 months    Cholesterol, Total  Date Value Ref Range Status  06/16/2023 153 100 - 199 mg/dL  Final   Cholesterol Piccolo, Waived  Date Value Ref Range Status  05/21/2016 251 (H) <200 mg/dL Final    Comment:                            Desirable                <200                         Borderline High      200- 239                         High                     >239    LDL Chol Calc (NIH)  Date Value Ref Range Status  06/16/2023 81 0 - 99 mg/dL Final   HDL  Date Value Ref Range Status  06/16/2023 56 >39 mg/dL Final   Triglycerides  Date Value Ref Range Status  06/16/2023 87 0 - 149 mg/dL Final   Triglycerides Piccolo,Waived  Date Value Ref Range Status  05/21/2016 292 (H) <150 mg/dL Final    Comment:                            Normal                   <150                         Borderline High     150 - 199                         High                200 - 499                         Very High                >499          Passed - Cr in normal range and within 360 days    Creatinine  Date Value Ref Range Status  07/18/2012 1.03 0.60 - 1.30 mg/dL Final   Creatinine, Ser  Date Value Ref Range Status  06/16/2023 0.89 0.76 - 1.27 mg/dL Final          Passed - Patient is not pregnant      Passed - Valid encounter within last 12 months    Recent Outpatient Visits           5 months ago Encounter for [employer] annual wellness exam   [place] [facility] [place], Megan P, DO   6 months ago Insect bite of abdominal wall, initial encounter   [place] [facility] [place], Megan P, DO   9 months ago Chronic otitis externa of right ear, unspecified type   [place] [facility] [place], Megan P, DO   11 months ago Primary hypertension   [place] [facility] [place], [place], DO   1 year ago Primary hypertension   [place] [facility] [place], Oralia Rud, DO       Future Appointments  In 4 weeks Laural Benes, Oralia Rud, DO [place] [facility]

## 2023-11-16 NOTE — Telephone Encounter (Signed)
Requested medication (s) are due for refill today: yes  Requested medication (s) are on the active medication list: yes  Last refill:  05/03/23  Future visit scheduled: yes  Notes to clinic:   Medication not assigned to a protocol, review manually      Requested Prescriptions  Pending Prescriptions Disp Refills   mupirocin ointment (BACTROBAN) 2 % [Pharmacy Med Name: MUPIROCIN 2% OINTMENT] 22 g 0    Sig: APPLY TO AFFECTED AREA TWICE A DAY     Off-Protocol Failed - 11/16/2023 11:48 AM      Failed - Medication not assigned to a protocol, review manually.      Passed - Valid encounter within last 12 months    Recent Outpatient Visits           5 months ago Encounter for [employer] annual wellness exam   [place] [facility] [place], [place] P, DO   6 months ago Insect bite of abdominal wall, initial encounter   [facility] [place], Megan P, DO   9 months ago Chronic otitis externa of right ear, unspecified type   [place] [facility] [place], Megan P, DO   11 months ago Primary hypertension   [place] [facility] [place], Abie, DO   1 year ago Primary hypertension   [place] [facility] [place], [place], DO       Future Appointments             In 4 weeks Laural Benes, Oralia Rud, DO [facility]            Signed Prescriptions Disp Refills   albuterol (VENTOLIN HFA) 108 (90 Base) MCG/ACT inhaler 8.5 each 0    Sig: INHALE 2 PUFFS BY MOUTH EVERY 4 HOURS AS NEEDED FOR WHEEZE OR FOR SHORTNESS OF BREATH     Pulmonology:  Beta Agonists 2 Failed - 11/16/2023 11:48 AM      Failed - Last BP in normal range    BP Readings from Last 1 Encounters:  06/16/23 (!) 147/70         Passed - Last Heart Rate in normal range    Pulse Readings from Last 1 Encounters:  06/16/23 65         Passed - Valid encounter within last 12 months    Recent Outpatient Visits            5 months ago Encounter for [employer] annual wellness exam   [place] [facility] [place], Megan P, DO   6 months ago Insect bite of abdominal wall, initial encounter   [place] [facility], [facility] P, DO   9 months ago Chronic otitis externa of right ear, unspecified type   [place] [facility] [place], Megan P, DO   11 months ago Primary hypertension   [place] [facility] [place], [place], DO   1 year ago Primary hypertension   [place] [facility] [place], [place], DO       Future Appointments             In 4 weeks Johnson, Megan P, DO  [facility]             rosuvastatin (CRESTOR) 20 MG tablet 90 tablet 0    Sig: TAKE 1 TABLET BY MOUTH EVERY DAY     Cardiovascular:  Antilipid - Statins 2 Failed -  11/16/2023 11:48 AM      Failed - Lipid Panel in normal range within the last 12 months    Cholesterol, Total  Date Value Ref Range Status  06/16/2023 153 100 - 199 mg/dL Final   Cholesterol [place]  Date Value Ref Range Status  05/21/2016 251 (H) <200 mg/dL Final    Comment:                            Desirable                <200                         Borderline High      200- 239                         High                     >239    LDL Chol Calc (NIH)  Date Value Ref Range Status  06/16/2023 81 0 - 99 mg/dL Final   HDL  Date Value Ref Range Status  06/16/2023 56 >39 mg/dL Final   Triglycerides  Date Value Ref Range Status  06/16/2023 87 0 - 149 mg/dL Final   Triglycerides Piccolo,Waived  Date Value Ref Range Status  05/21/2016 292 (H) <150 mg/dL Final    Comment:                            Normal                   <150                         Borderline High     150 - 199                         High                200 - 499                         Very High                >499          Passed - Cr in normal range and  within 360 days    Creatinine  Date Value Ref Range Status  07/18/2012 1.03 0.60 - 1.30 mg/dL Final   Creatinine, Ser  Date Value Ref Range Status  06/16/2023 0.89 0.76 - 1.27 mg/dL Final         Passed - Patient is not pregnant      Passed - Valid encounter within last 12 months    Recent Outpatient Visits           5 months ago Encounter for [employer] annual wellness exam   [place] [facility] [place], Megan P, DO   6 months ago Insect bite of abdominal wall, initial encounter   [place] [facility] [place], Megan P, DO   9 months ago Chronic otitis externa of right ear, unspecified type   [place] [facility] [place], Megan P, DO   11 months ago Primary hypertension   [place]  [facility] [place], Megan P, DO   1 year ago Primary hypertension   [place] [facility] [place], [place], DO       Future Appointments             In 4 weeks Laural Benes, Oralia Rud, DO [facility]

## 2023-12-05 DIAGNOSIS — B379 Candidiasis, unspecified: Secondary | ICD-10-CM | POA: Diagnosis not present

## 2023-12-09 ENCOUNTER — Other Ambulatory Visit: Payer: Self-pay | Admitting: Family Medicine

## 2023-12-12 NOTE — Telephone Encounter (Signed)
 Requested medication (s) are due for refill today: yes  Requested medication (s) are on the active medication list: yes  Last refill:  02/03/23  Future visit scheduled: yes  Notes to clinic:    Pharmacy comment: Product Backordered/Unavailable:NEED NEW RX. PREVIOUS BRAND NOT AVAILABLE. PLEASE  SPECIFY THAT ITS OK TO CHANGE TO ANOTHER BRAND.       Requested Prescriptions  Pending Prescriptions Disp Refills   levothyroxine (SYNTHROID) 50 MCG tablet [Pharmacy Med Name: LEVOTHYROXINE 50 MCG TABLET] 90 tablet 3    Sig: TAKE 1 TABLET BY MOUTH EVERY DAY     Endocrinology:  Hypothyroid Agents Passed - 12/12/2023 10:32 AM      Passed - TSH in normal range and within 360 days    TSH  Date Value Ref Range Status  06/16/2023 0.680 0.450 - 4.500 uIU/mL Final         Passed - Valid encounter within last 12 months    Recent Outpatient Visits           5 months ago Encounter for [employer] annual wellness exam   [place] [facility] [place], Megan P, DO   7 months ago Insect bite of abdominal wall, initial encounter   [place] [facility] [place], Megan P, DO   10 months ago Chronic otitis externa of right ear, unspecified type   [place] [facility] [place], Megan P, DO   11 months ago Primary hypertension   [place] [facility] [place], [place], DO   1 year ago Primary hypertension   [place] [facility] [place], Oralia Rud, DO       Future Appointments             In 2 days Dorcas Carrow, DO [place] [facility]

## 2023-12-14 ENCOUNTER — Encounter: Payer: Self-pay | Admitting: Family Medicine

## 2023-12-14 ENCOUNTER — Ambulatory Visit (INDEPENDENT_AMBULATORY_CARE_PROVIDER_SITE_OTHER): Payer: Medicare Other | Admitting: Family Medicine

## 2023-12-14 VITALS — BP 152/82 | HR 77 | Ht 72.0 in | Wt 164.6 lb

## 2023-12-14 DIAGNOSIS — E039 Hypothyroidism, unspecified: Secondary | ICD-10-CM

## 2023-12-14 DIAGNOSIS — I10 Essential (primary) hypertension: Secondary | ICD-10-CM

## 2023-12-14 DIAGNOSIS — E782 Mixed hyperlipidemia: Secondary | ICD-10-CM

## 2023-12-14 LAB — MICROALBUMIN, URINE WAIVED
Creatinine, Urine Waived: 50 mg/dL (ref 10–300)
Microalb, Ur Waived: 80 mg/L — ABNORMAL HIGH (ref 0–19)
Microalb/Creat Ratio: >300 mg/g — ABNORMAL HIGH (ref <30)

## 2023-12-14 MED ORDER — ROSUVASTATIN CALCIUM 20 MG PO TABS: 20.0000 mg | ORAL_TABLET | Freq: Every day | ORAL | 1 refills | Status: DC

## 2023-12-14 MED ORDER — LISINOPRIL 10 MG PO TABS
10.0000 mg | ORAL_TABLET | Freq: Every day | ORAL | 3 refills | Status: DC
Start: 2023-12-14 — End: 2024-01-23

## 2023-12-14 NOTE — Progress Notes (Signed)
 BP (!) 152/82   Pulse 77   Ht 6' (1.829 m)   Wt 164 lb 9.6 oz (74.7 kg)   SpO2 98%   BMI 22.32 kg/m    Subjective:    Patient ID: Marcus Wright, male    DOB: [birth year], 68 y.o.   MRN: [id number]  HPI: Marcus Wright is a 68 y.o. male  Chief Complaint  Patient presents with   Hypertension   Hyperlipidemia   HYPERTENSION / HYPERLIPIDEMIA Satisfied with current treatment? no Duration of hypertension: chronic BP monitoring frequency: not checking BP medication side effects: no Past BP meds: none Duration of hyperlipidemia: chronic Cholesterol medication side effects: no Cholesterol supplements: none Past cholesterol medications: crestor Medication compliance: excellent compliance Aspirin: no Recent stressors: no Recurrent headaches: no Visual changes: no Palpitations: no Dyspnea: no Chest pain: no Lower extremity edema: no Dizzy/lightheaded: no  HYPOTHYROIDISM Thyroid control status:controlled Satisfied with current treatment? yes Medication side effects: no Medication compliance: excellent compliance Recent dose adjustment:no Fatigue: no Cold intolerance: no Heat intolerance: no Weight gain: no Weight loss: no Constipation: no Diarrhea/loose stools: no Palpitations: no Lower extremity edema: no Anxiety/depressed mood: no  Relevant past medical, surgical, family and social history reviewed and updated as indicated. Interim medical history since our last visit reviewed. Allergies and medications reviewed and updated.  Review of Systems  Constitutional: Negative.   Respiratory: Negative.    Cardiovascular: Negative.   Gastrointestinal: Negative.   Musculoskeletal: Negative.   Psychiatric/Behavioral: Negative.      Per HPI unless specifically indicated above     Objective:    BP (!) 152/82   Pulse 77   Ht 6' (1.829 m)   Wt 164 lb 9.6 oz (74.7 kg)   SpO2 98%   BMI 22.32 kg/m   Wt Readings from Last 3 Encounters:  12/14/23 164 lb  9.6 oz (74.7 kg)  06/16/23 160 lb 12.8 oz (72.9 kg)  05/03/23 158 lb 9.6 oz (71.9 kg)    Physical Exam Vitals and nursing note reviewed.  Constitutional:      General: He is not in acute distress.    Appearance: Normal appearance. He is not ill-appearing, toxic-appearing or diaphoretic.  HENT:     Head: Normocephalic and atraumatic.     Comments: Profound hearing loss    Right Ear: External ear normal.     Left Ear: External ear normal.     Nose: Nose normal.     Mouth/Throat:     Mouth: Mucous membranes are moist.     Pharynx: Oropharynx is clear.  Eyes:     General: No scleral icterus.       Right eye: No discharge.        Left eye: No discharge.     Extraocular Movements: Extraocular movements intact.     Conjunctiva/sclera: Conjunctivae normal.     Pupils: Pupils are equal, round, and reactive to light.  Cardiovascular:     Rate and Rhythm: Normal rate and regular rhythm.     Pulses: Normal pulses.     Heart sounds: Normal heart sounds. No murmur heard.    No friction rub. No gallop.  Pulmonary:     Effort: Pulmonary effort is normal. No respiratory distress.     Breath sounds: Normal breath sounds. No stridor. No wheezing, rhonchi or rales.  Chest:     Chest wall: No tenderness.  Musculoskeletal:        General: Normal range of motion.     Cervical  back: Normal range of motion and neck supple.  Skin:    General: Skin is warm and dry.     Capillary Refill: Capillary refill takes less than 2 seconds.     Coloration: Skin is not jaundiced or pale.     Findings: No bruising, erythema, lesion or rash.  Neurological:     General: No focal deficit present.     Mental Status: He is alert and oriented to person, place, and time. Mental status is at baseline.  Psychiatric:        Mood and Affect: Mood normal.        Behavior: Behavior normal.        Thought Content: Thought content normal.        Judgment: Judgment normal.     Results for orders placed or performed in  visit on 06/16/23  Urinalysis, Routine w reflex microscopic   Collection Time: 06/16/23  9:36 AM  Result Value Ref Range   Specific Gravity, UA 1.020 1.005 - 1.030   pH, UA 7.0 5.0 - 7.5   Color, UA Yellow Yellow   Appearance Ur Clear Clear   Leukocytes,UA Negative Negative   Protein,UA Negative Negative/Trace   Glucose, UA Negative Negative   Ketones, UA Negative Negative   RBC, UA Negative Negative   Bilirubin, UA Negative Negative   Urobilinogen, Ur 0.2 0.2 - 1.0 mg/dL   Nitrite, UA Negative Negative   Microscopic Examination Comment   Microalbumin, Urine Waived   Collection Time: 06/16/23  9:36 AM  Result Value Ref Range   Microalb, Ur Waived 30 (H) 0 - 19 mg/L   Creatinine, Urine Waived 100 10 - 300 mg/dL   Microalb/Creat Ratio <30 <30 mg/g  Comprehensive metabolic panel   Collection Time: 06/16/23  9:46 AM  Result Value Ref Range   Glucose 83 70 - 99 mg/dL   BUN 10 8 - 27 mg/dL   Creatinine, Ser 1.61 0.76 - 1.27 mg/dL   eGFR 93 >09 UE/AVW/0.98   BUN/Creatinine Ratio 11 10 - 24   Sodium 141 134 - 144 mmol/L   Potassium 4.3 3.5 - 5.2 mmol/L   Chloride 99 96 - 106 mmol/L   CO2 26 20 - 29 mmol/L   Calcium 9.6 8.6 - 10.2 mg/dL   Total Protein 6.7 6.0 - 8.5 g/dL   Albumin 4.6 3.9 - 4.9 g/dL   Globulin, Total 2.1 1.5 - 4.5 g/dL   Bilirubin Total 0.6 0.0 - 1.2 mg/dL   Alkaline Phosphatase 72 44 - 121 IU/L   AST 21 0 - 40 IU/L   ALT 14 0 - 44 IU/L  CBC with Differential/Platelet   Collection Time: 06/16/23  9:46 AM  Result Value Ref Range   WBC 5.5 3.4 - 10.8 x10E3/uL   RBC 4.80 4.14 - 5.80 x10E6/uL   Hemoglobin 13.5 13.0 - 17.7 g/dL   Hematocrit 11.9 14.7 - 51.0 %   MCV 87 79 - 97 fL   MCH 28.1 26.6 - 33.0 pg   MCHC 32.3 31.5 - 35.7 g/dL   RDW 82.9 56.2 - 13.0 %   Platelets 255 150 - 450 x10E3/uL   Neutrophils 45 Not Estab. %   Lymphs 40 Not Estab. %   Monocytes 9 Not Estab. %   Eos 5 Not Estab. %   Basos 1 Not Estab. %   Neutrophils Absolute 2.5 1.4 - 7.0  x10E3/uL   Lymphocytes Absolute 2.2 0.7 - 3.1 x10E3/uL   Monocytes Absolute 0.5 0.1 -  0.9 x10E3/uL   EOS (ABSOLUTE) 0.3 0.0 - 0.4 x10E3/uL   Basophils Absolute 0.1 0.0 - 0.2 x10E3/uL   Immature Granulocytes 0 Not Estab. %   Immature Grans (Abs) 0.0 0.0 - 0.1 x10E3/uL  Lipid Panel w/o Chol/HDL Ratio   Collection Time: 06/16/23  9:46 AM  Result Value Ref Range   Cholesterol, Total 153 100 - 199 mg/dL   Triglycerides 87 0 - 149 mg/dL   HDL 56 >16 mg/dL   VLDL Cholesterol Cal 16 5 - 40 mg/dL   LDL Chol Calc (NIH) 81 0 - 99 mg/dL  PSA   Collection Time: 06/16/23  9:46 AM  Result Value Ref Range   Prostate Specific Ag, Serum 0.3 0.0 - 4.0 ng/mL  TSH   Collection Time: 06/16/23  9:46 AM  Result Value Ref Range   TSH 0.680 0.450 - 4.500 uIU/mL      Assessment & Plan:   Problem List Items Addressed This Visit       Cardiovascular and Mediastinum   Hypertension - Primary   Running high. Will start him on lisinopril and recheck in 6 weeks. Call with any concerns.       Relevant Medications   lisinopril (ZESTRIL) 10 MG tablet   rosuvastatin (CRESTOR) 20 MG tablet   Other Relevant Orders   CBC with Differential/Platelet   Comprehensive metabolic panel   Microalbumin, Urine Waived     Endocrine   Hypothyroidism   Rechecking labs today. Await results. Treat as needed.       Relevant Orders   CBC with Differential/Platelet   Comprehensive metabolic panel   TSH     Other   Hyperlipidemia   Under good control on current regimen. Continue current regimen. Continue to monitor. Call with any concerns. Refills given. Labs drawn today.       Relevant Medications   lisinopril (ZESTRIL) 10 MG tablet   rosuvastatin (CRESTOR) 20 MG tablet   Other Relevant Orders   CBC with Differential/Platelet   Comprehensive metabolic panel   Lipid Panel w/o Chol/HDL Ratio     Follow up plan: Return in about 4 weeks (around 01/11/2024).

## 2023-12-14 NOTE — Assessment & Plan Note (Signed)
 Rechecking labs today. Await results. Treat as needed.

## 2023-12-14 NOTE — Assessment & Plan Note (Signed)
 Under good control on current regimen. Continue current regimen. Continue to monitor. Call with any concerns. Refills given. Labs drawn today.

## 2023-12-14 NOTE — Assessment & Plan Note (Signed)
 Running high. Will start him on lisinopril and recheck in 6 weeks. Call with any concerns.

## 2023-12-15 LAB — COMPREHENSIVE METABOLIC PANEL
ALT: 13 IU/L (ref 0–44)
AST: 19 IU/L (ref 0–40)
Albumin: 4.7 g/dL (ref 3.9–4.9)
Alkaline Phosphatase: 84 IU/L (ref 44–121)
BUN/Creatinine Ratio: 9 — ABNORMAL LOW (ref 10–24)
BUN: 10 mg/dL (ref 8–27)
Bilirubin Total: 0.6 mg/dL (ref 0.0–1.2)
CO2: 27 mmol/L (ref 20–29)
Calcium: 9.8 mg/dL (ref 8.6–10.2)
Chloride: 98 mmol/L (ref 96–106)
Creatinine, Ser: 1.06 mg/dL (ref 0.76–1.27)
Globulin, Total: 2.3 g/dL (ref 1.5–4.5)
Glucose: 112 mg/dL — ABNORMAL HIGH (ref 70–99)
Potassium: 4.3 mmol/L (ref 3.5–5.2)
Sodium: 140 mmol/L (ref 134–144)
Total Protein: 7 g/dL (ref 6.0–8.5)
eGFR: 76 mL/min/{1.73_m2} (ref >59)

## 2023-12-15 LAB — CBC WITH DIFFERENTIAL/PLATELET
Basophils Absolute: 0.1 10*3/uL (ref 0.0–0.2)
Basos: 1 %
EOS (ABSOLUTE): 0.3 10*3/uL (ref 0.0–0.4)
Eos: 5 %
Hematocrit: 43.1 % (ref 37.5–51.0)
Hemoglobin: 14.4 g/dL (ref 13.0–17.7)
Immature Grans (Abs): 0 10*3/uL (ref 0.0–0.1)
Immature Granulocytes: 0 %
Lymphocytes Absolute: 2.3 10*3/uL (ref 0.7–3.1)
Lymphs: 39 %
MCH: 28.7 pg (ref 26.6–33.0)
MCHC: 33.4 g/dL (ref 31.5–35.7)
MCV: 86 fL (ref 79–97)
Monocytes Absolute: 0.4 10*3/uL (ref 0.1–0.9)
Monocytes: 7 %
Neutrophils Absolute: 2.8 10*3/uL (ref 1.4–7.0)
Neutrophils: 48 %
Platelets: 213 10*3/uL (ref 150–450)
RBC: 5.01 x10E6/uL (ref 4.14–5.80)
RDW: 13.1 % (ref 11.6–15.4)
WBC: 5.9 10*3/uL (ref 3.4–10.8)

## 2023-12-15 LAB — LIPID PANEL W/O CHOL/HDL RATIO
Cholesterol, Total: 169 mg/dL (ref 100–199)
HDL: 58 mg/dL (ref >39)
LDL Chol Calc (NIH): 85 mg/dL (ref 0–99)
Triglycerides: 151 mg/dL — ABNORMAL HIGH (ref 0–149)
VLDL Cholesterol Cal: 26 mg/dL (ref 5–40)

## 2023-12-15 LAB — TSH: TSH: 0.348 u[IU]/mL — ABNORMAL LOW (ref 0.450–4.500)

## 2023-12-16 ENCOUNTER — Other Ambulatory Visit: Payer: Self-pay | Admitting: Family Medicine

## 2023-12-16 ENCOUNTER — Encounter: Payer: Self-pay | Admitting: Family Medicine

## 2023-12-16 MED ORDER — LEVOTHYROXINE SODIUM 25 MCG PO TABS: 25.0000 ug | ORAL_TABLET | Freq: Every day | ORAL | 2 refills | Status: DC

## 2023-12-26 DIAGNOSIS — Z8522 Personal history of malignant neoplasm of nasal cavities, middle ear, and accessory sinuses: Secondary | ICD-10-CM | POA: Diagnosis not present

## 2023-12-26 DIAGNOSIS — B379 Candidiasis, unspecified: Secondary | ICD-10-CM | POA: Diagnosis not present

## 2023-12-27 ENCOUNTER — Other Ambulatory Visit: Payer: Self-pay | Admitting: Student

## 2023-12-27 DIAGNOSIS — Z8522 Personal history of malignant neoplasm of nasal cavities, middle ear, and accessory sinuses: Secondary | ICD-10-CM

## 2024-01-05 ENCOUNTER — Encounter: Payer: Self-pay | Admitting: Family Medicine

## 2024-01-06 ENCOUNTER — Other Ambulatory Visit: Payer: Self-pay | Admitting: Family Medicine

## 2024-01-06 NOTE — Telephone Encounter (Signed)
 Requested Prescriptions  Pending Prescriptions Disp Refills   albuterol (VENTOLIN HFA) 108 (90 Base) MCG/ACT inhaler [Pharmacy Med Name: ALBUTEROL HFA (PROAIR) INHALER] 8.5 each 0    Sig: INHALE 2 PUFFS BY MOUTH EVERY 4 HOURS AS NEEDED FOR WHEEZE OR FOR SHORTNESS OF BREATH     Pulmonology:  Beta Agonists 2 Failed - 01/06/2024  4:06 PM      Failed - Last BP in normal range    BP Readings from Last 1 Encounters:  12/14/23 (!) 152/82         Passed - Last Heart Rate in normal range    Pulse Readings from Last 1 Encounters:  12/14/23 77         Passed - Valid encounter within last 12 months    Recent Outpatient Visits           3 weeks ago Primary hypertension   [place] [facility] [place], [place], DO

## 2024-01-07 ENCOUNTER — Other Ambulatory Visit: Payer: Self-pay | Admitting: Family Medicine

## 2024-01-09 NOTE — Telephone Encounter (Signed)
 Requested medication (s) are due for refill today: No  Requested medication (s) are on the active medication list: No  Last refill:  12/16/23 (25 MCG)  Future visit scheduled: Yes  Notes to clinic:  50 MCG dose not on current medication list      Requested Prescriptions  Pending Prescriptions Disp Refills   levothyroxine (SYNTHROID) 50 MCG tablet [Pharmacy Med Name: LEVOTHYROXINE 50 MCG TABLET] 30 tablet 0    Sig: TAKE 1 TABLET BY MOUTH EVERY DAY. DR APPROVED CHANGE IN BRAND DUE TO BACKORDER     Endocrinology:  Hypothyroid Agents Failed - 01/09/2024 12:54 PM      Failed - TSH in normal range and within 360 days    TSH  Date Value Ref Range Status  12/14/2023 0.348 (L) 0.450 - 4.500 uIU/mL Final         Passed - Valid encounter within last 12 months    Recent Outpatient Visits           3 weeks ago Primary hypertension   [place] [facility] [place], [place], DO

## 2024-01-19 ENCOUNTER — Ambulatory Visit
Admission: RE | Admit: 2024-01-19 | Discharge: 2024-01-19 | Disposition: A | Discharge status: Home / Self Care | Source: Ambulatory Visit | Attending: Student

## 2024-01-19 ENCOUNTER — Ambulatory Visit
Admission: RE | Admit: 2024-01-19 | Discharge: 2024-01-19 | Disposition: A | Discharge status: Home / Self Care | Source: Ambulatory Visit | Attending: Student | Admitting: Student

## 2024-01-19 DIAGNOSIS — M542 Cervicalgia: Secondary | ICD-10-CM | POA: Diagnosis not present

## 2024-01-19 DIAGNOSIS — I700 Atherosclerosis of aorta: Secondary | ICD-10-CM | POA: Diagnosis not present

## 2024-01-19 DIAGNOSIS — R633 Feeding difficulties, unspecified: Secondary | ICD-10-CM | POA: Diagnosis not present

## 2024-01-19 DIAGNOSIS — Z8522 Personal history of malignant neoplasm of nasal cavities, middle ear, and accessory sinuses: Secondary | ICD-10-CM

## 2024-01-19 MED ORDER — IOPAMIDOL (ISOVUE-300) INJECTION 61%
100.0000 mL | Freq: Once | INTRAVENOUS | Status: AC | PRN
  Administered 2024-01-19: 100 mL via INTRAVENOUS

## 2024-01-23 ENCOUNTER — Encounter: Payer: Self-pay | Admitting: Family Medicine

## 2024-01-23 ENCOUNTER — Ambulatory Visit (INDEPENDENT_AMBULATORY_CARE_PROVIDER_SITE_OTHER): Admitting: Family Medicine

## 2024-01-23 VITALS — BP 111/70 | HR 72 | Ht 72.0 in | Wt 165.0 lb

## 2024-01-23 DIAGNOSIS — I10 Essential (primary) hypertension: Secondary | ICD-10-CM

## 2024-01-23 DIAGNOSIS — E039 Hypothyroidism, unspecified: Secondary | ICD-10-CM | POA: Diagnosis not present

## 2024-01-23 NOTE — Assessment & Plan Note (Signed)
 Dizzy on lisinopril . Has been off for several days and BP running well. Stop lisinopril . Call with any concerns.

## 2024-01-23 NOTE — Assessment & Plan Note (Signed)
 Rechecking labs today. Await results. Treat as needed.

## 2024-01-23 NOTE — Progress Notes (Signed)
 BP 111/70 (BP Location: Left Arm, Patient Position: Sitting)   Pulse 72   Ht 6' (1.829 m)   Wt 165 lb (74.8 kg)   SpO2 98%   BMI 22.38 kg/m    Subjective:    Patient ID: Marcus Wright, male    DOB: March 27, 1956, 68 y.o.   MRN: [id number]  HPI: Marcus Wright is a 68 y.o. male  Chief Complaint  Patient presents with   Hypertension    Pt complains that lisinopril  makes him dizzy.    HYPERTENSION- was dizzy on his lisinopril  all the time. Stopped it about 4 days ago. Now feeling better. Not dizzy anymore.   Hypertension status: better  Satisfied with current treatment? yes Duration of hypertension: chronic BP monitoring frequency:  not checking BP medication side effects:  yes Medication compliance: stopped medicine 4 days ago Previous BP meds:lisinopril  Aspirin: no Recurrent headaches: no Visual changes: no Palpitations: no Dyspnea: no Chest pain: no Lower extremity edema: no Dizzy/lightheaded: yes  HYPOTHYROIDISM Thyroid  control status: unsure Satisfied with current treatment? yes Medication side effects: no Medication compliance: excellent compliance Recent dose adjustment:yes Fatigue: no Cold intolerance: no Heat intolerance: no Weight gain: no Weight loss: no Constipation: no Diarrhea/loose stools: no Palpitations: no Lower extremity edema: no Anxiety/depressed mood: no   Relevant past medical, surgical, family and social history reviewed and updated as indicated. Interim medical history since our last visit reviewed. Allergies and medications reviewed and updated.  Review of Systems  Constitutional: Negative.   Respiratory: Negative.    Cardiovascular: Negative.   Musculoskeletal: Negative.   Neurological:  Positive for dizziness and light-headedness. Negative for tremors, seizures, syncope, facial asymmetry, speech difficulty, weakness, numbness and headaches.  Hematological: Negative.   Psychiatric/Behavioral: Negative.      Per HPI  unless specifically indicated above     Objective:    BP 111/70 (BP Location: Left Arm, Patient Position: Sitting)   Pulse 72   Ht 6' (1.829 m)   Wt 165 lb (74.8 kg)   SpO2 98%   BMI 22.38 kg/m   Wt Readings from Last 3 Encounters:  01/23/24 165 lb (74.8 kg)  12/14/23 164 lb 9.6 oz (74.7 kg)  06/16/23 160 lb 12.8 oz (72.9 kg)    Physical Exam Vitals and nursing note reviewed.  Constitutional:      General: He is not in acute distress.    Appearance: Normal appearance. He is not ill-appearing, toxic-appearing or diaphoretic.  HENT:     Head: Normocephalic and atraumatic.     Comments: Profound hearing loss    Right Ear: External ear normal.     Left Ear: External ear normal.     Nose: Nose normal.     Mouth/Throat:     Mouth: Mucous membranes are moist.     Pharynx: Oropharynx is clear.  Eyes:     General: No scleral icterus.       Right eye: No discharge.        Left eye: No discharge.     Extraocular Movements: Extraocular movements intact.     Conjunctiva/sclera: Conjunctivae normal.     Pupils: Pupils are equal, round, and reactive to light.  Cardiovascular:     Rate and Rhythm: Normal rate and regular rhythm.     Pulses: Normal pulses.     Heart sounds: Normal heart sounds. No murmur heard.    No friction rub. No gallop.  Pulmonary:     Effort: Pulmonary effort is normal. No respiratory  distress.     Breath sounds: Normal breath sounds. No stridor. No wheezing, rhonchi or rales.  Chest:     Chest wall: No tenderness.  Musculoskeletal:        General: Normal range of motion.     Cervical back: Normal range of motion and neck supple.  Skin:    General: Skin is warm and dry.     Capillary Refill: Capillary refill takes less than 2 seconds.     Coloration: Skin is not jaundiced or pale.     Findings: No bruising, erythema, lesion or rash.  Neurological:     General: No focal deficit present.     Mental Status: He is alert and oriented to person, place, and  time. Mental status is at baseline.  Psychiatric:        Mood and Affect: Mood normal.        Behavior: Behavior normal.        Thought Content: Thought content normal.        Judgment: Judgment normal.     Results for orders placed or performed in visit on 12/14/23  Microalbumin, Urine Waived   Collection Time: 12/14/23 10:32 AM  Result Value Ref Range   Microalb, Ur Waived 80 (H) 0 - 19 mg/L   Creatinine, Urine Waived 50 10 - 300 mg/dL   Microalb/Creat Ratio >300 (H) <30 mg/g  CBC with Differential/Platelet   Collection Time: 12/14/23 10:33 AM  Result Value Ref Range   WBC 5.9 3.4 - 10.8 x10E3/uL   RBC 5.01 4.14 - 5.80 x10E6/uL   Hemoglobin 14.4 13.0 - 17.7 g/dL   Hematocrit 78.4 69.6 - 51.0 %   MCV 86 79 - 97 fL   MCH 28.7 26.6 - 33.0 pg   MCHC 33.4 31.5 - 35.7 g/dL   RDW 29.5 28.4 - 13.2 %   Platelets 213 150 - 450 x10E3/uL   Neutrophils 48 Not Estab. %   Lymphs 39 Not Estab. %   Monocytes 7 Not Estab. %   Eos 5 Not Estab. %   Basos 1 Not Estab. %   Neutrophils Absolute 2.8 1.4 - 7.0 x10E3/uL   Lymphocytes Absolute 2.3 0.7 - 3.1 x10E3/uL   Monocytes Absolute 0.4 0.1 - 0.9 x10E3/uL   EOS (ABSOLUTE) 0.3 0.0 - 0.4 x10E3/uL   Basophils Absolute 0.1 0.0 - 0.2 x10E3/uL   Immature Granulocytes 0 Not Estab. %   Immature Grans (Abs) 0.0 0.0 - 0.1 x10E3/uL  Comprehensive metabolic panel   Collection Time: 12/14/23 10:33 AM  Result Value Ref Range   Glucose 112 (H) 70 - 99 mg/dL   BUN 10 8 - 27 mg/dL   Creatinine, Ser 4.40 0.76 - 1.27 mg/dL   eGFR 76 >10 UV/OZD/6.64   BUN/Creatinine Ratio 9 (L) 10 - 24   Sodium 140 134 - 144 mmol/L   Potassium 4.3 3.5 - 5.2 mmol/L   Chloride 98 96 - 106 mmol/L   CO2 27 20 - 29 mmol/L   Calcium  9.8 8.6 - 10.2 mg/dL   Total Protein 7.0 6.0 - 8.5 g/dL   Albumin  4.7 3.9 - 4.9 g/dL   Globulin, Total 2.3 1.5 - 4.5 g/dL   Bilirubin Total 0.6 0.0 - 1.2 mg/dL   Alkaline Phosphatase 84 44 - 121 IU/L   AST 19 0 - 40 IU/L   ALT 13 0 - 44 IU/L   Lipid Panel w/o Chol/HDL Ratio   Collection Time: 12/14/23 10:33 AM  Result Value Ref Range  Cholesterol, Total 169 100 - 199 mg/dL   Triglycerides 993 (H) 0 - 149 mg/dL   HDL 58 >71 mg/dL   VLDL Cholesterol Cal 26 5 - 40 mg/dL   LDL Chol Calc (NIH) 85 0 - 99 mg/dL  TSH   Collection Time: 12/14/23 10:33 AM  Result Value Ref Range   TSH 0.348 (L) 0.450 - 4.500 uIU/mL      Assessment & Plan:   Problem List Items Addressed This Visit       Cardiovascular and Mediastinum   Hypertension - Primary   Dizzy on lisinopril . Has been off for several days and BP running well. Stop lisinopril . Call with any concerns.       Relevant Orders   Basic metabolic panel with GFR     Endocrine   Hypothyroidism   Rechecking labs today. Await results. Treat as needed.       Relevant Orders   TSH     Follow up plan: Return in about 5 months (around 06/24/2024) for physical.

## 2024-01-24 ENCOUNTER — Other Ambulatory Visit: Payer: Self-pay | Admitting: Family Medicine

## 2024-01-24 ENCOUNTER — Encounter: Payer: Self-pay | Admitting: Family Medicine

## 2024-01-24 LAB — BASIC METABOLIC PANEL WITH GFR
BUN/Creatinine Ratio: 7 — ABNORMAL LOW (ref 10–24)
BUN: 7 mg/dL — ABNORMAL LOW (ref 8–27)
CO2: 25 mmol/L (ref 20–29)
Calcium: 9.6 mg/dL (ref 8.6–10.2)
Chloride: 98 mmol/L (ref 96–106)
Creatinine, Ser: 0.99 mg/dL (ref 0.76–1.27)
Glucose: 102 mg/dL — ABNORMAL HIGH (ref 70–99)
Potassium: 4.2 mmol/L (ref 3.5–5.2)
Sodium: 138 mmol/L (ref 134–144)
eGFR: 82 mL/min/{1.73_m2} (ref >59)

## 2024-01-24 LAB — TSH: TSH: 2.66 u[IU]/mL (ref 0.450–4.500)

## 2024-01-24 MED ORDER — LEVOTHYROXINE SODIUM 25 MCG PO TABS: 25.0000 ug | ORAL_TABLET | Freq: Every day | ORAL | 3 refills | Status: DC

## 2024-02-17 ENCOUNTER — Other Ambulatory Visit: Payer: Self-pay | Admitting: Family Medicine

## 2024-02-21 ENCOUNTER — Other Ambulatory Visit: Payer: Self-pay | Admitting: *Deleted

## 2024-02-21 NOTE — Telephone Encounter (Signed)
 Requested Prescriptions  Pending Prescriptions Disp Refills   albuterol  (VENTOLIN  HFA) 108 (90 Base) MCG/ACT inhaler [Pharmacy Med Name: ALBUTEROL  HFA (PROAIR ) INHALER] 8.5 each 2    Sig: INHALE 2 PUFFS BY MOUTH EVERY 4 HOURS AS NEEDED FOR WHEEZE OR FOR SHORTNESS OF BREATH     Pulmonology:  Beta Agonists 2 Passed - 02/21/2024  4:21 PM      Passed - Last BP in normal range    BP Readings from Last 1 Encounters:  01/23/24 111/70         Passed - Last Heart Rate in normal range    Pulse Readings from Last 1 Encounters:  01/23/24 72         Passed - Valid encounter within last 12 months    Recent Outpatient Visits           4 weeks ago Primary hypertension   [facility] [place], [place], DO   2 months ago Primary hypertension   [facility] [place], [place], DO

## 2024-03-04 ENCOUNTER — Other Ambulatory Visit: Payer: Self-pay | Admitting: Family Medicine

## 2024-03-05 NOTE — Telephone Encounter (Signed)
 Requested Prescriptions  Pending Prescriptions Disp Refills   fluticasone  (FLONASE ) 50 MCG/ACT nasal spray [Pharmacy Med Name: FLUTICASONE  PROP 50 MCG SPRAY] 48 mL 1    Sig: SPRAY 2 SPRAYS INTO EACH NOSTRIL EVERY DAY     Ear, Nose, and Throat: Nasal Preparations - Corticosteroids Passed - 03/05/2024  5:47 PM      Passed - Valid encounter within last 12 months    Recent Outpatient Visits           1 month ago Primary hypertension   [facility] [place], Megan P, DO   2 months ago Primary hypertension   [facility] [place], [place], DO

## 2024-04-20 ENCOUNTER — Telehealth: Payer: Self-pay | Admitting: Family Medicine

## 2024-04-20 NOTE — Telephone Encounter (Signed)
 Copied from CRM #[id number]. Topic: Medicare AWV >> Apr 20, 2024 10:20 AM Nathanel DEL wrote: Reason for CRM: Called 04/20/2024 to sched AWV - NO VOICEMAIL  Nathanel Paschal; Care Guide Ambulatory Clinical Support [place] [facility] Direct Dial: [phone]

## 2024-06-18 ENCOUNTER — Ambulatory Visit: Admitting: Family Medicine

## 2024-07-10 ENCOUNTER — Ambulatory Visit (INDEPENDENT_AMBULATORY_CARE_PROVIDER_SITE_OTHER): Admitting: Emergency Medicine

## 2024-07-10 VITALS — BP 136/82 | Ht 73.0 in | Wt 166.0 lb

## 2024-07-10 DIAGNOSIS — Z23 Encounter for immunization: Secondary | ICD-10-CM | POA: Diagnosis not present

## 2024-07-10 NOTE — Patient Instructions (Signed)
 Marcus Wright,  Thank you for taking the time for your Medicare Wellness Visit. I appreciate your continued commitment to your health goals. Please review the care plan we discussed, and feel free to reach out if I can assist you further.  Medicare recommends these wellness visits once per year to help you and your care team stay ahead of potential health issues. These visits are designed to focus on prevention, allowing your provider to concentrate on managing your acute and chronic conditions during your regular appointments.  Please note that Annual Wellness Visits do not include a physical exam. Some assessments may be limited, especially if the visit was conducted virtually. If needed, we may recommend a separate in-person follow-up with your provider.  Ongoing Care Seeing your primary care provider every 3 to 6 months helps us  monitor your health and provide consistent, personalized care.   Referrals If a referral was made during today's visit and you haven't received any updates within two weeks, please contact the referred provider directly to check on the status.  Recommended Screenings:  You may get the shingles vaccines at your local pharmacy at your convenience.  Health Maintenance  Topic Date Due   COVID-19 Vaccine (1) Never done   Zoster (Shingles) Vaccine (1 of 2) Never done   Medicare Annual Wellness Visit  06/15/2024   DTaP/Tdap/Td vaccine (2 - Td or Tdap) 05/21/2026   Colon Cancer Screening  07/19/2026   Pneumococcal Vaccine for age over 12  Completed   Flu Shot  Completed   Hepatitis C Screening  Completed   Meningitis B Vaccine  Aged Out       07/10/2024    9:24 AM  Advanced Directives  Does Patient Have a Medical Advance Directive? Yes  Type of Estate agent of [place];Living will  Does patient want to make changes to medical advance directive? No - Patient declined  Copy of Healthcare Power of Attorney in Chart? No - copy requested    Advance Care Planning is important because it: Ensures you receive medical care that aligns with your values, goals, and preferences. Provides guidance to your family and loved ones, reducing the emotional burden of decision-making during critical moments.  Vision: Annual vision screenings are recommended for early detection of glaucoma, cataracts, and diabetic retinopathy. These exams can also reveal signs of chronic conditions such as diabetes and high blood pressure.  Dental: Annual dental screenings help detect early signs of oral cancer, gum disease, and other conditions linked to overall health, including heart disease and diabetes.  Please see the attached documents for additional preventive care recommendations.   Fall Prevention in the Home, Adult Falls can cause injuries and affect people of all ages. There are many simple things that you can do to make your home safe and to help prevent falls. If you need it, ask for help making these changes. What actions can I take to prevent falls? General information Use good lighting in all rooms. Make sure to: Replace any light bulbs that burn out. Turn on lights if it is dark and use night-lights. Keep items that you use often in easy-to-reach places. Lower the shelves around your home if needed. Move furniture so that there are clear paths around it. Do not keep throw rugs or other things on the floor that can make you trip. If any of your floors are uneven, fix them. Add color or contrast paint or tape to clearly mark and help you see: Grab bars or handrails.  First and last steps of staircases. Where the edge of each step is. If you use a ladder or stepladder: Make sure that it is fully opened. Do not climb a closed ladder. Make sure the sides of the ladder are locked in place. Have someone hold the ladder while you use it. Know where your pets are as you move through your home. What can I do in the bathroom?     Keep the  floor dry. Clean up any water  that is on the floor right away. Remove soap buildup in the bathtub or shower. Buildup makes bathtubs and showers slippery. Use non-skid mats or decals on the floor of the bathtub or shower. Attach bath mats securely with double-sided, non-slip rug tape. If you need to sit down while you are in the shower, use a non-slip stool. Install grab bars by the toilet and in the bathtub and shower. Do not use towel bars as grab bars. What can I do in the bedroom? Make sure that you have a light by your bed that is easy to reach. Do not use any sheets or blankets on your bed that hang to the floor. Have a firm bench or chair with side arms that you can use for support when you get dressed. What can I do in the kitchen? Clean up any spills right away. If you need to reach something above you, use a sturdy step stool that has a grab bar. Keep electrical cables out of the way. Do not use floor polish or wax that makes floors slippery. What can I do with my stairs? Do not leave anything on the stairs. Make sure that you have a light switch at the top and the bottom of the stairs. Have them installed if you do not have them. Make sure that there are handrails on both sides of the stairs. Fix handrails that are broken or loose. Make sure that handrails are as long as the staircases. Install non-slip stair treads on all stairs in your home if they do not have carpet. Avoid having throw rugs at the top or bottom of stairs, or secure the rugs with carpet tape to prevent them from moving. Choose a carpet design that does not hide the edge of steps on the stairs. Make sure that carpet is firmly attached to the stairs. Fix any carpet that is loose or worn. What can I do on the outside of my home? Use bright outdoor lighting. Repair the edges of walkways and driveways and fix any cracks. Clear paths of anything that can make you trip, such as tools or rocks. Add color or contrast  paint or tape to clearly mark and help you see high doorway thresholds. Trim any bushes or trees on the main path into your home. Check that handrails are securely fastened and in good repair. Both sides of all steps should have handrails. Install guardrails along the edges of any raised decks or porches. Have leaves, snow, and ice cleared regularly. Use sand, salt, or ice melt on walkways during winter months if you live where there is ice and snow. In the garage, clean up any spills right away, including grease or oil spills. What other actions can I take? Review your medicines with your health care provider. Some medicines can make you confused or feel dizzy. This can increase your chance of falling. Wear closed-toe shoes that fit well and support your feet. Wear shoes that have rubber soles and low heels. Use a cane,  walker, scooter, or crutches that help you move around if needed. Talk with your provider about other ways that you can decrease your risk of falls. This may include seeing a physical therapist to learn to do exercises to improve movement and strength. Where to find more information Centers for Disease Control and Prevention, STEADI: TonerPromos.no [employer] on Aging: BaseRingTones.pl National Institute on Aging: BaseRingTones.pl Contact a health care provider if: You are afraid of falling at home. You feel weak, drowsy, or dizzy at home. You fall at home. Get help right away if you: Lose consciousness or have trouble moving after a fall. Have a fall that causes a head injury. These symptoms may be an emergency. Get help right away. Call 911. Do not wait to see if the symptoms will go away. Do not drive yourself to the hospital. This information is not intended to replace advice given to you by your health care provider. Make sure you discuss any questions you have with your health care provider. Document Revised: 05/17/2022 Document Reviewed: 05/17/2022 Elsevier Patient Education   2024 [employer].

## 2024-07-10 NOTE — Progress Notes (Signed)
 Subjective:   Marcus Wright is a 68 y.o. who presents for a Medicare Wellness preventive visit.  As a reminder, Annual Wellness Visits don't include a physical exam, and some assessments may be limited, especially if this visit is performed virtually. We may recommend an in-person follow-up visit with your provider if needed.  Visit Complete: In person    Persons Participating in Visit: Patient.  AWV Questionnaire: No: Patient Medicare AWV questionnaire was not completed prior to this visit.  Cardiac Risk Factors include: advanced age (>60men, >75 women);male gender;dyslipidemia;hypertension     Objective:    Today's Vitals   07/10/24 0904 07/10/24 0944  BP: (!) 152/102 136/82  Weight: 166 lb (75.3 kg)   Height: 6' 1 (1.854 m)    Body mass index is 21.9 kg/m.     07/10/2024    9:24 AM 12/04/2022    9:38 PM 10/27/2021   10:59 AM 03/13/2021    3:32 PM 01/14/2021    8:58 AM 12/25/2020   11:51 PM 12/19/2020    1:50 PM  Advanced Directives  Does Patient Have a Medical Advance Directive? Yes No Yes No Yes No No  Type of Estate agent of [place];Living will  Healthcare Power of [place];Living will  Healthcare Power of [place];Living will    Does patient want to make changes to medical advance directive? No - Patient declined        Copy of Healthcare Power of Attorney in Chart? No - copy requested    No - copy requested    Would patient like information on creating a medical advance directive?       No - Patient declined    Current Medications (verified) Outpatient Encounter Medications as of 07/10/2024  Medication Sig   albuterol  (VENTOLIN  HFA) 108 (90 Base) MCG/ACT inhaler INHALE 2 PUFFS BY MOUTH EVERY 4 HOURS AS NEEDED FOR WHEEZE OR FOR SHORTNESS OF BREATH   fluticasone  (FLONASE ) 50 MCG/ACT nasal spray SPRAY 2 SPRAYS INTO EACH NOSTRIL EVERY DAY   levothyroxine  (SYNTHROID ) 25 MCG tablet Take 1 tablet (25 mcg total) by mouth daily before breakfast.    rosuvastatin  (CRESTOR ) 20 MG tablet Take 1 tablet (20 mg total) by mouth daily.   mupirocin  ointment (BACTROBAN ) 2 % APPLY TO AFFECTED AREA TWICE A DAY (Patient not taking: Reported on 07/10/2024)   ofloxacin  (OCUFLOX ) 0.3 % ophthalmic solution Place 3 drops in the right ear 3 times daily for the next 7 days (Patient not taking: Reported on 07/10/2024)   No facility-administered encounter medications on file as of 07/10/2024.    Allergies (verified) Patient has no known allergies.   History: Past Medical History:  Diagnosis Date   Cancer (HCC) 2003   skin'   Hyperlipidemia    Hypertension    Hypothyroidism    Thyroid  disorder    Wears dentures    full upper   Wears hearing aid    Past Surgical History:  Procedure Laterality Date   BACK SURGERY  2011   BREAST SURGERY Right 11-22-12   CHOLECYSTECTOMY     COLONOSCOPY     COLONOSCOPY WITH PROPOFOL  N/A 07/19/2016   Procedure: COLONOSCOPY WITH PROPOFOL ;  Surgeon: Rogelia Copping, MD;  Location[facility];  Service: Endoscopy;  Laterality: N/A;   REPLACEMENT TOTAL KNEE Right 1993   SINUS ENDO W/FUSION Right 09/03/2020   Procedure: Removal of Right nasopharyngeal mass, Biopsy and frozen sections;  Surgeon: Llewellyn Gerard LABOR, DO;  Location: [facility] OR;  Service: ENT;  Laterality: Right;   SINUS SURGERY WITH INSTATRAK  2003   cancer   History reviewed. No pertinent family history. Social History   Socioeconomic History   Marital status: Widowed    Spouse name: Not on file   Number of children: Not on file   Years of education: Not on file   Highest education level: 7th grade  Occupational History   Not on file  Tobacco Use   Smoking status: Former    Types: Cigarettes   Smokeless tobacco: Never   Tobacco comments:    quit 20+ yrs ago  Vaping Use   Vaping status: Never Used  Substance and Sexual Activity   Alcohol use: Not Currently    Comment: on occasion, 1-2 beers or wine occ. , last use 2015   Drug use: No    Sexual activity: Not Currently  Other Topics Concern   Not on file  Social History Narrative   Not on file   Social Drivers of Health   Financial Resource Strain: Low Risk  (07/10/2024)   Overall Financial Resource Strain (CARDIA)    Difficulty of Paying Living Expenses: Not hard at all  Food Insecurity: No Food Insecurity (07/10/2024)   Hunger Vital Sign    Worried About Running Out of Food in the Last Year: Never true    Ran Out of Food in the Last Year: Never true  Transportation Needs: No Transportation Needs (07/10/2024)   PRAPARE - Administrator, Civil Service (Medical): No    Lack of Transportation (Non-Medical): No  Physical Activity: Inactive (07/10/2024)   Exercise Vital Sign    Days of Exercise per Week: 0 days    Minutes of Exercise per Session: 0 min  Stress: No Stress Concern Present (07/10/2024)   [employer] of Occupational Health - Occupational Stress Questionnaire    Feeling of Stress: Not at all  Social Connections: Socially Isolated (07/10/2024)   Social Connection and Isolation Panel    Frequency of Communication with Friends and Family: More than three times a week    Frequency of Social Gatherings with Friends and Family: More than three times a week    Attends Religious Services: Never    Database administrator or Organizations: No    Attends Banker Meetings: Never    Marital Status: Widowed    Tobacco Counseling Counseling given: Not Answered Tobacco comments: quit 20+ yrs ago    Clinical Intake:  Pre-visit preparation completed: Yes  Pain : No/denies pain     BMI - recorded: 21.9 Nutritional Status: BMI of 19-24  Normal Nutritional Risks: None Diabetes: No  No results found for: HGBA1C   How often do you need to have someone help you when you read instructions, pamphlets, or other written materials from your doctor or pharmacy?: 1 - Never  Interpreter Needed?: No  Information entered by :: Vina Ned, CMA   Activities of Daily Living     07/10/2024    9:10 AM  In your present state of health, do you have any difficulty performing the following activities:  Hearing? 1  Comment bilateral hearing loss, has hearing aids, but doesn't wear  Vision? 0  Difficulty concentrating or making decisions? 0  Walking or climbing stairs? 0  Dressing or bathing? 0  Doing errands, shopping? 0  Preparing Food and eating ? N  Using the Toilet? N  In the past six months, have you accidently leaked urine? N  Do you have  problems with loss of bowel control? N  Managing your Medications? N  Managing your Finances? N  Housekeeping or managing your Housekeeping? N    Patient Care Team: Vicci Duwaine SQUIBB, DO as PCP - General (Family Medicine) Blair Mt, MD as Referring Physician (Otolaryngology)  I have updated your Care Teams any recent Medical Services you may have received from other providers in the past year.     Assessment:   This is a routine wellness examination for [place].  Hearing/Vision screen Hearing Screening - Comments:: Bilateral hearing loss Vision Screening - Comments:: Gets routine eye exams. [facility]   Goals Addressed               This Visit's Progress     DIET - INCREASE WATER  INTAKE (pt-stated)         Depression Screen     07/10/2024    9:20 AM 12/19/2023    9:29 AM 06/16/2023    9:05 AM 05/03/2023    9:47 AM 12/21/2022    9:59 AM 05/10/2022   11:25 AM 11/10/2021    1:35 PM  PHQ 2/9 Scores  PHQ - 2 Score 3 1 0 0 3 0 0  PHQ- 9 Score 4 1 0 0 6  0    Fall Risk     07/10/2024    9:27 AM 06/16/2023    9:05 AM 05/03/2023    9:47 AM 12/21/2022    9:58 AM 05/10/2022   11:25 AM  Fall Risk   Falls in the past year? 0 0 0 0 1  Number falls in past yr: 0 0 0 0 0  Injury with Fall? 0 0 0 0 0  Risk for fall due to : No Fall Risks No Fall Risks No Fall Risks No Fall Risks History of fall(s)  Follow up Falls evaluation completed Falls  evaluation completed Falls evaluation completed Falls evaluation completed Falls evaluation completed      Data saved with a previous flowsheet row definition    MEDICARE RISK AT HOME:  Medicare Risk at Home Any stairs in or around the home?: Yes If so, are there any without handrails?: No Home free of loose throw rugs in walkways, pet beds, electrical cords, etc?: Yes Adequate lighting in your home to reduce risk of falls?: Yes Life alert?: No Use of a cane, walker or w/c?: No Grab bars in the bathroom?: Yes Shower chair or bench in shower?: No Elevated toilet seat or a handicapped toilet?: Yes  TIMED UP AND GO:  Was the test performed?  Yes  Length of time to ambulate 10 feet: 5 sec Gait steady and fast without use of assistive device  Cognitive Function: 6CIT completed        07/10/2024    9:31 AM 06/16/2023    9:28 AM 05/10/2022   11:28 AM 06/03/2020    9:40 AM 05/22/2018    9:11 AM  6CIT Screen  What Year? 0 points 0 points 0 points 4 points 4 points  What month? 0 points 0 points 0 points 0 points 0 points  What time? 0 points 0 points 0 points 3 points 0 points  Count back from 20 0 points 0 points 0 points 0 points 0 points  Months in reverse 4 points 4 points 0 points 2 points 2 points  Repeat phrase 0 points 0 points 2 points 2 points 2 points  Total Score 4 points 4 points 2 points 11 points 8 points  Immunizations Immunization History  Administered Date(s) Administered   Fluad Quad(high Dose 65+) 05/28/2019, 06/03/2020, 06/28/2022   Fluad Trivalent(High Dose 65+) 06/16/2023   INFLUENZA, HIGH DOSE SEASONAL PF 07/10/2024   Influenza,inj,Quad PF,6+ Mos 11/06/2015, 06/21/2016, 06/22/2017, 05/22/2018   Pneumococcal Conjugate-13 02/05/2021   Pneumococcal Polysaccharide-23 11/29/2019   Tdap 05/21/2016    Screening Tests Health Maintenance  Topic Date Due   COVID-19 Vaccine (1) Never done   Zoster Vaccines- Shingrix (1 of 2) Never done   Medicare Annual  Wellness (AWV)  06/15/2024   DTaP/Tdap/Td (2 - Td or Tdap) 05/21/2026   Colonoscopy  07/19/2026   Pneumococcal Vaccine: 50+ Years  Completed   Influenza Vaccine  Completed   Hepatitis C Screening  Completed   Meningococcal B Vaccine  Aged Out    Health Maintenance Items Addressed: Vaccines Given today: flu, See Nurse Notes at the end of this note  Additional Screening:  Vision Screening: Recommended annual ophthalmology exams for early detection of glaucoma and other disorders of the eye. Is the patient up to date with their annual eye exam?  Yes  Who is the provider or what is the name of the office in which the patient attends annual eye exams? [facility] [place]  Dental Screening: Recommended annual dental exams for proper oral hygiene  Community Resource Referral / Chronic Care Management: CRR required this visit?  No   CCM required this visit?  No   Plan:    I have personally reviewed and noted the following in the patient's chart:   Medical and social history Use of alcohol, tobacco or illicit drugs  Current medications and supplements including opioid prescriptions. Patient is not currently taking opioid prescriptions. Functional ability and status Nutritional status Physical activity Advanced directives List of other physicians Hospitalizations, surgeries, and ER visits in previous 12 months Vitals Screenings to include cognitive, depression, and falls Referrals and appointments  In addition, I have reviewed and discussed with patient certain preventive protocols, quality metrics, and best practice recommendations. A written personalized care plan for preventive services as well as general preventive health recommendations were provided to patient.   Vina Ned, CMA   07/10/2024   After Visit Summary: (In Person-Printed) AVS printed and given to the patient  Notes:  Flu vaccine given 6 CIT Score - 4 Wants Shingles vaccine (pharmacy) Declined  Covid vaccine

## 2024-07-24 ENCOUNTER — Ambulatory Visit (INDEPENDENT_AMBULATORY_CARE_PROVIDER_SITE_OTHER): Admitting: Family Medicine

## 2024-07-24 ENCOUNTER — Encounter: Payer: Self-pay | Admitting: Family Medicine

## 2024-07-24 VITALS — BP 96/64 | HR 73 | Temp 97.5°F | Ht 73.0 in | Wt 169.2 lb

## 2024-07-24 DIAGNOSIS — E039 Hypothyroidism, unspecified: Secondary | ICD-10-CM

## 2024-07-24 DIAGNOSIS — E782 Mixed hyperlipidemia: Secondary | ICD-10-CM | POA: Diagnosis not present

## 2024-07-24 DIAGNOSIS — Z0000 Encounter for general adult medical examination without abnormal findings: Secondary | ICD-10-CM

## 2024-07-24 DIAGNOSIS — I10 Essential (primary) hypertension: Secondary | ICD-10-CM | POA: Diagnosis not present

## 2024-07-24 DIAGNOSIS — B370 Candidal stomatitis: Secondary | ICD-10-CM

## 2024-07-24 DIAGNOSIS — H353221 Exudative age-related macular degeneration, left eye, with active choroidal neovascularization: Secondary | ICD-10-CM | POA: Diagnosis not present

## 2024-07-24 DIAGNOSIS — R3911 Hesitancy of micturition: Secondary | ICD-10-CM

## 2024-07-24 DIAGNOSIS — M5412 Radiculopathy, cervical region: Secondary | ICD-10-CM | POA: Diagnosis not present

## 2024-07-24 LAB — MICROALBUMIN, URINE WAIVED
Creatinine, Urine Waived: 50 mg/dL (ref 10–300)
Microalb, Ur Waived: 80 mg/L — ABNORMAL HIGH (ref 0–19)

## 2024-07-24 MED ORDER — NYSTATIN 100000 UNIT/ML MT SUSP: 5.0000 mL | Freq: Four times a day (QID) | OROMUCOSAL | 0 refills | Status: AC

## 2024-07-24 NOTE — Assessment & Plan Note (Signed)
 Rechecking labs today. Await results. Treat as needed.

## 2024-07-24 NOTE — Assessment & Plan Note (Signed)
 Running low not on medicine. Continue hydration. Continue to monitor. Call with any concerns. Labs drawn today.

## 2024-07-24 NOTE — Progress Notes (Signed)
 BP 96/64   Pulse 73   Temp (!) 97.5 F (36.4 C)   Ht 6' 1 (1.854 m)   Wt 169 lb 3.2 oz (76.7 kg)   BMI 22.32 kg/m    Subjective:    Patient ID: Marcus Wright, male    DOB: [birth year], 68 y.o.   MRN: [id number]  HPI: Marcus Wright is a 68 y.o. male presenting on 07/24/2024 for comprehensive medical examination. Current medical complaints include:  HYPERTENSION / HYPERLIPIDEMIA Satisfied with current treatment? yes Duration of hypertension: chronic BP monitoring frequency: rarely BP medication side effects: not on anything Duration of hyperlipidemia: chronic Cholesterol medication side effects: not on anything Cholesterol supplements: none Past cholesterol medications: none Medication compliance: N/A Aspirin: no Recent stressors: no Recurrent headaches: no Visual changes: no Palpitations: no Dyspnea: no Chest pain: no Lower extremity edema: no Dizzy/lightheaded: no  HYPOTHYROIDISM Thyroid  control status:stable Satisfied with current treatment? yes Medication side effects: no Medication compliance: excellent compliance Recent dose adjustment:no Fatigue: no Cold intolerance: no Heat intolerance: no Weight gain: no Weight loss: no Constipation: no Diarrhea/loose stools: no Palpitations: no Lower extremity edema: no Anxiety/depressed mood: no   He currently lives with: alone Interim Problems from his last visit: none  Depression Screen done today and results listed below:     07/10/2024    9:20 AM 12/19/2023    9:29 AM 06/16/2023    9:05 AM 05/03/2023    9:47 AM 12/21/2022    9:59 AM  Depression screen PHQ 2/9  Decreased Interest 3 1 0 0 3  Down, Depressed, Hopeless 0 0 0 0 0  PHQ - 2 Score 3 1 0 0 3  Altered sleeping 0 0 0 0 0  Tired, decreased energy 1 0 0 0 0  Change in appetite 0 0 0 0 0  Feeling bad or failure about yourself  0 0 0 0 0  Trouble concentrating 0 0 0 0 0  Moving slowly or fidgety/restless 0 0 0 0 3  Suicidal thoughts 0 0 0  0 0  PHQ-9 Score 4 1 0 0 6  Difficult doing work/chores Not difficult at all Not difficult at all  Not difficult at all Not difficult at all    Past Medical History:  Past Medical History:  Diagnosis Date   Arthritis    Cancer (HCC) 2003   skin'   GERD (gastroesophageal reflux disease)    Hyperlipidemia    Hypertension    Hypothyroidism    Thyroid  disorder    Wears dentures    full upper   Wears hearing aid     Surgical History:  Past Surgical History:  Procedure Laterality Date   BACK SURGERY  2011   BREAST SURGERY Right 11-22-12   CHOLECYSTECTOMY     COLONOSCOPY     COLONOSCOPY WITH PROPOFOL  N/A 07/19/2016   Procedure: COLONOSCOPY WITH PROPOFOL ;  Surgeon: Marcus Copping, MD;  Location: [facility];  Service: Endoscopy;  Laterality: N/A;   REPLACEMENT TOTAL KNEE Right 1993   SINUS ENDO W/FUSION Right 09/03/2020   Procedure: Removal of Right nasopharyngeal mass, Biopsy and frozen sections;  Surgeon: Marcus Wright LABOR, DO;  Location: [facility] OR;  Service: ENT;  Laterality: Right;   SINUS SURGERY WITH INSTATRAK  2003   cancer    Medications:  Current Outpatient Medications on File Prior to Visit  Medication Sig   albuterol  (VENTOLIN  HFA) 108 (90 Base) MCG/ACT inhaler INHALE 2 PUFFS BY MOUTH EVERY 4 HOURS AS NEEDED  FOR WHEEZE OR FOR SHORTNESS OF BREATH   fluticasone  (FLONASE ) 50 MCG/ACT nasal spray SPRAY 2 SPRAYS INTO EACH NOSTRIL EVERY DAY   levothyroxine  (SYNTHROID ) 25 MCG tablet Take 1 tablet (25 mcg total) by mouth daily before breakfast.   rosuvastatin  (CRESTOR ) 20 MG tablet Take 1 tablet (20 mg total) by mouth daily.   No current facility-administered medications on file prior to visit.    Allergies:  No Known Allergies  Social History:  Social History   Socioeconomic History   Marital status: Widowed    Spouse name: Not on file   Number of children: Not on file   Years of education: Not on file   Highest education level: 7th grade  Occupational History    Not on file  Tobacco Use   Smoking status: Former    Current packs/day: 0.00    Types: Cigarettes   Smokeless tobacco: Never   Tobacco comments:    quit 20+ yrs ago  Vaping Use   Vaping status: Never Used  Substance and Sexual Activity   Alcohol use: Not Currently    Comment: on occasion, 1-2 beers or wine occ. , last use 2015   Drug use: No   Sexual activity: Not Currently  Other Topics Concern   Not on file  Social History Narrative   Not on file   Social Drivers of Wright   Financial Resource Strain: Low Risk  (07/20/2024)   Overall Financial Resource Strain (CARDIA)    Difficulty of Paying Living Expenses: Not hard at all  Food Insecurity: No Food Insecurity (07/20/2024)   Hunger Vital Sign    Worried About Running Out of Food in the Last Year: Never true    Ran Out of Food in the Last Year: Never true  Transportation Needs: No Transportation Needs (07/20/2024)   PRAPARE - Administrator, Civil Service (Medical): No    Lack of Transportation (Non-Medical): No  Physical Activity: Sufficiently Active (07/20/2024)   Exercise Vital Sign    Days of Exercise per Week: 5 days    Minutes of Exercise per Session: 120 min  Recent Concern: Physical Activity - Inactive (07/10/2024)   Exercise Vital Sign    Days of Exercise per Week: 0 days    Minutes of Exercise per Session: 0 min  Stress: No Stress Concern Present (07/20/2024)   Marcus Wright - Occupational Stress Questionnaire    Feeling of Stress: Not at all  Social Connections: Socially Isolated (07/20/2024)   Social Connection and Isolation Panel    Frequency of Communication with Friends and Family: Never    Frequency of Social Gatherings with Friends and Family: More than three times a week    Attends Religious Services: Never    Database Administrator or Organizations: No    Attends Banker Meetings: Not on file    Marital Status: Widowed  Intimate Partner  Violence: Not At Risk (07/10/2024)   Humiliation, Afraid, Rape, and Kick questionnaire    Fear of Current or Ex-Partner: No    Emotionally Abused: No    Physically Abused: No    Sexually Abused: No   Social History   Tobacco Use  Smoking Status Former   Current packs/day: 0.00   Types: Cigarettes  Smokeless Tobacco Never  Tobacco Comments   quit 20+ yrs ago   Social History   Substance and Sexual Activity  Alcohol Use Not Currently   Comment: on occasion, 1-2 beers or wine  occ. , last use 2015    Family History:  History reviewed. No pertinent family history.  Past medical history, surgical history, medications, allergies, family history and social history reviewed with patient today and changes made to appropriate areas of the chart.   Review of Systems  Constitutional: Negative.   HENT:  Positive for hearing loss and sinus pain. Negative for congestion, ear discharge, ear pain, nosebleeds, sore throat and tinnitus.   Eyes:  Positive for blurred vision. Negative for double vision, photophobia, pain, discharge and redness.  Respiratory: Negative.  Negative for stridor.   Cardiovascular: Negative.   Gastrointestinal: Negative.   Musculoskeletal:  Positive for myalgias and neck pain. Negative for back pain, falls and joint pain.  Skin: Negative.   Neurological: Negative.   Endo/Heme/Allergies: Negative.   Psychiatric/Behavioral: Negative.     All other ROS negative except what is listed above and in the HPI.      Objective:    BP 96/64   Pulse 73   Temp (!) 97.5 F (36.4 C)   Ht 6' 1 (1.854 m)   Wt 169 lb 3.2 oz (76.7 kg)   BMI 22.32 kg/m   Wt Readings from Last 3 Encounters:  07/24/24 169 lb 3.2 oz (76.7 kg)  07/10/24 166 lb (75.3 kg)  01/23/24 165 lb (74.8 kg)    Physical Exam Vitals and nursing note reviewed.  Constitutional:      General: He is not in acute distress.    Appearance: Normal appearance. He is not ill-appearing, toxic-appearing or  diaphoretic.  HENT:     Head: Normocephalic and atraumatic.     Right Ear: Tympanic membrane, ear canal and external ear normal. There is no impacted cerumen.     Left Ear: Tympanic membrane, ear canal and external ear normal. There is no impacted cerumen.     Nose: Nose normal. No congestion or rhinorrhea.     Mouth/Throat:     Mouth: Mucous membranes are moist.     Pharynx: Oropharynx is clear. No oropharyngeal exudate or posterior oropharyngeal erythema.  Eyes:     General: No scleral icterus.       Right eye: No discharge.        Left eye: No discharge.     Extraocular Movements: Extraocular movements intact.     Conjunctiva/sclera: Conjunctivae normal.     Pupils: Pupils are equal, round, and reactive to light.  Neck:     Vascular: No carotid bruit.  Cardiovascular:     Rate and Rhythm: Normal rate and regular rhythm.     Pulses: Normal pulses.     Heart sounds: No murmur heard.    No friction rub. No gallop.  Pulmonary:     Effort: Pulmonary effort is normal. No respiratory distress.     Breath sounds: Normal breath sounds. No stridor. No wheezing, rhonchi or rales.  Chest:     Chest wall: No tenderness.  Abdominal:     General: Abdomen is flat. Bowel sounds are normal. There is no distension.     Palpations: Abdomen is soft. There is no mass.     Tenderness: There is no abdominal tenderness. There is no right CVA tenderness, left CVA tenderness, guarding or rebound.     Hernia: No hernia is present.  Genitourinary:    Comments: Genital exam deferred with shared decision making Musculoskeletal:        General: No swelling, tenderness, deformity or signs of injury.     Cervical back: Normal  range of motion and neck supple. No rigidity. No muscular tenderness.     Right lower leg: No edema.     Left lower leg: No edema.  Lymphadenopathy:     Cervical: No cervical adenopathy.  Skin:    General: Skin is warm and dry.     Capillary Refill: Capillary refill takes less than  2 seconds.     Coloration: Skin is not jaundiced or pale.     Findings: No bruising, erythema, lesion or rash.  Neurological:     General: No focal deficit present.     Mental Status: He is alert and oriented to person, place, and time.     Cranial Nerves: No cranial nerve deficit.     Sensory: No sensory deficit.     Motor: No weakness.     Coordination: Coordination normal.     Gait: Gait normal.     Deep Tendon Reflexes: Reflexes normal.  Psychiatric:        Mood and Affect: Mood normal.        Behavior: Behavior normal.        Thought Content: Thought content normal.        Judgment: Judgment normal.     Results for orders placed or performed in visit on 01/23/24  Basic metabolic panel with GFR   Collection Time: 01/23/24 11:22 AM  Result Value Ref Range   Glucose 102 (H) 70 - 99 mg/dL   BUN 7 (L) 8 - 27 mg/dL   Creatinine, Ser 9.00 0.76 - 1.27 mg/dL   eGFR 82 >40 fO/fpw/8.26   BUN/Creatinine Ratio 7 (L) 10 - 24   Sodium 138 134 - 144 mmol/L   Potassium 4.2 3.5 - 5.2 mmol/L   Chloride 98 96 - 106 mmol/L   CO2 25 20 - 29 mmol/L   Calcium  9.6 8.6 - 10.2 mg/dL  TSH   Collection Time: 01/23/24 11:22 AM  Result Value Ref Range   TSH 2.660 0.450 - 4.500 uIU/mL      Assessment & Plan:   Problem List Items Addressed This Visit       Cardiovascular and Mediastinum   Hypertension   Running low not on medicine. Continue hydration. Continue to monitor. Call with any concerns. Labs drawn today.       Relevant Orders   Comprehensive metabolic panel with GFR   CBC with Differential/Platelet   Microalbumin, Urine Waived   Exudative age-related macular degeneration, left eye, with active choroidal neovascularization (HCC)   Continue to follow with ophthalmology. Call with any concerns.         Endocrine   Hypothyroidism   Rechecking labs today. Await results. Treat as needed.       Relevant Orders   Comprehensive metabolic panel with GFR   CBC with  Differential/Platelet   TSH     Other   Hyperlipidemia   Rechecking labs today. Await results. Treat as needed.       Relevant Orders   Comprehensive metabolic panel with GFR   CBC with Differential/Platelet   Lipid Panel w/o Chol/HDL Ratio   Cervical radicular pain   Will refer for PT. Call with any concerns. Continue to monitor.       Relevant Orders   Ambulatory referral to Physical Therapy   Other Visit Diagnoses       Routine general medical examination at a Wright care facility    -  Primary   Vaccines up to date/declined. Screening labs checked today. Colonoscopy  up to date. Continue diet and exercise. Call with any concerns.     Hesitancy       Labs drawn today. Await results.   Relevant Orders   Comprehensive metabolic panel with GFR   CBC with Differential/Platelet   PSA     Oral candidiasis       Will treat with nystatin . Call with any concerns.   Relevant Medications   nystatin  (MYCOSTATIN ) 100000 UNIT/ML suspension        LABORATORY TESTING:  Wright maintenance labs ordered today as discussed above.   The natural history of prostate cancer and ongoing controversy regarding screening and potential treatment outcomes of prostate cancer has been discussed with the patient. The meaning of a false positive PSA and a false negative PSA has been discussed. He indicates understanding of the limitations of this screening test and wishes to proceed with screening PSA testing.   IMMUNIZATIONS:   - Tdap: Tetanus vaccination status reviewed: last tetanus booster within 10 years. - Influenza: Up to date - Pneumovax: Up to date - Prevnar: Up to date - COVID: Refused - HPV: Not applicable - Shingrix vaccine: Not Up to date  SCREENING: - Colonoscopy: Up to date  Discussed with patient purpose of the colonoscopy is to detect colon cancer at curable precancerous or early stages   PATIENT COUNSELING:    Sexuality: Discussed sexually transmitted diseases, partner  selection, use of condoms, avoidance of unintended pregnancy  and contraceptive alternatives.   Advised to avoid cigarette smoking.  I discussed with the patient that most people either abstain from alcohol or drink within safe limits (<=14/week and <=4 drinks/occasion for males, <=7/weeks and <= 3 drinks/occasion for females) and that the risk for alcohol disorders and other Wright effects rises proportionally with the number of drinks per week and how often a drinker exceeds daily limits.  Discussed cessation/primary prevention of drug use and availability of treatment for abuse.   Diet: Encouraged to adjust caloric intake to maintain  or achieve ideal body weight, to reduce intake of dietary saturated fat and total fat, to limit sodium intake by avoiding high sodium foods and not adding table salt, and to maintain adequate dietary potassium and calcium  preferably from fresh fruits, vegetables, and low-fat dairy products.    stressed the importance of regular exercise  Injury prevention: Discussed safety belts, safety helmets, smoke detector, smoking near bedding or upholstery.   Dental Wright: Discussed importance of regular tooth brushing, flossing, and dental visits.   Follow up plan: NEXT PREVENTATIVE PHYSICAL DUE IN 1 YEAR. Return in about 6 months (around 01/22/2025).

## 2024-07-24 NOTE — Patient Instructions (Signed)
 We'll get them to set up some physical therapy to help stretch out your neck and get you feeling better- they'll call your son  I've sent in some medicine for the yeast at the back of your throat.   I'll see you in 6 months- let us  know if you need anything.

## 2024-07-24 NOTE — Assessment & Plan Note (Signed)
 Will refer for PT. Call with any concerns. Continue to monitor.

## 2024-07-24 NOTE — Assessment & Plan Note (Signed)
Continue to follow with ophthalmology. Call with any concerns.  

## 2024-07-25 LAB — CBC WITH DIFFERENTIAL/PLATELET
Basophils Absolute: 0.1 x10E3/uL (ref 0.0–0.2)
Basos: 1 %
EOS (ABSOLUTE): 0.2 x10E3/uL (ref 0.0–0.4)
Eos: 4 %
Hematocrit: 42.6 % (ref 37.5–51.0)
Hemoglobin: 14.2 g/dL (ref 13.0–17.7)
Immature Grans (Abs): 0 x10E3/uL (ref 0.0–0.1)
Immature Granulocytes: 0 %
Lymphocytes Absolute: 2 x10E3/uL (ref 0.7–3.1)
Lymphs: 39 %
MCH: 29.6 pg (ref 26.6–33.0)
MCHC: 33.3 g/dL (ref 31.5–35.7)
MCV: 89 fL (ref 79–97)
Monocytes Absolute: 0.4 x10E3/uL (ref 0.1–0.9)
Monocytes: 7 %
Neutrophils Absolute: 2.5 x10E3/uL (ref 1.4–7.0)
Neutrophils: 48 %
Platelets: 251 x10E3/uL (ref 150–450)
RBC: 4.8 x10E6/uL (ref 4.14–5.80)
RDW: 12.7 % (ref 11.6–15.4)
WBC: 5.1 x10E3/uL (ref 3.4–10.8)

## 2024-07-25 LAB — PSA: Prostate Specific Ag, Serum: 0.3 ng/mL (ref 0.0–4.0)

## 2024-07-25 LAB — COMPREHENSIVE METABOLIC PANEL WITH GFR
ALT: 9 IU/L (ref 0–44)
AST: 20 IU/L (ref 0–40)
Albumin: 4.7 g/dL (ref 3.9–4.9)
Alkaline Phosphatase: 78 IU/L (ref 47–123)
BUN/Creatinine Ratio: 9 — ABNORMAL LOW (ref 10–24)
BUN: 9 mg/dL (ref 8–27)
Bilirubin Total: 0.6 mg/dL (ref 0.0–1.2)
CO2: 27 mmol/L (ref 20–29)
Calcium: 9.7 mg/dL (ref 8.6–10.2)
Chloride: 98 mmol/L (ref 96–106)
Creatinine, Ser: 1.02 mg/dL (ref 0.76–1.27)
Globulin, Total: 2.1 g/dL (ref 1.5–4.5)
Glucose: 110 mg/dL — ABNORMAL HIGH (ref 70–99)
Potassium: 3.8 mmol/L (ref 3.5–5.2)
Sodium: 142 mmol/L (ref 134–144)
Total Protein: 6.8 g/dL (ref 6.0–8.5)
eGFR: 80 mL/min/1.73 (ref >59)

## 2024-07-25 LAB — LIPID PANEL W/O CHOL/HDL RATIO
Cholesterol, Total: 155 mg/dL (ref 100–199)
HDL: 45 mg/dL (ref >39)
LDL Chol Calc (NIH): 74 mg/dL (ref 0–99)
Triglycerides: 216 mg/dL — ABNORMAL HIGH (ref 0–149)
VLDL Cholesterol Cal: 36 mg/dL (ref 5–40)

## 2024-07-25 LAB — TSH: TSH: 7.24 u[IU]/mL — ABNORMAL HIGH (ref 0.450–4.500)

## 2024-07-27 ENCOUNTER — Ambulatory Visit: Payer: Self-pay | Admitting: Family Medicine

## 2024-07-27 DIAGNOSIS — E039 Hypothyroidism, unspecified: Secondary | ICD-10-CM

## 2024-07-27 MED ORDER — LEVOTHYROXINE SODIUM 50 MCG PO TABS: 50.0000 ug | ORAL_TABLET | Freq: Every day | ORAL | 0 refills | Status: DC

## 2024-08-02 ENCOUNTER — Other Ambulatory Visit: Payer: Self-pay | Admitting: Family Medicine

## 2024-08-03 NOTE — Telephone Encounter (Signed)
 Requested Prescriptions  Pending Prescriptions Disp Refills   albuterol  (VENTOLIN  HFA) 108 (90 Base) MCG/ACT inhaler [Pharmacy Med Name: ALBUTEROL  HFA (PROAIR ) INHALER] 8.5 each 0    Sig: INHALE 2 PUFFS BY MOUTH EVERY 4 HOURS AS NEEDED FOR WHEEZE OR FOR SHORTNESS OF BREATH     Pulmonology:  Beta Agonists 2 Passed - 08/03/2024  4:29 PM      Passed - Last BP in normal range    BP Readings from Last 1 Encounters:  07/24/24 96/64         Passed - Last Heart Rate in normal range    Pulse Readings from Last 1 Encounters:  07/24/24 73         Passed - Valid encounter within last 12 months    Recent Outpatient Visits           1 week ago Routine general medical examination at a health care facility   [facility] [place], Megan P, DO   6 months ago Primary hypertension   [place] [facility] [place], [place], DO   7 months ago Primary hypertension   [place] [facility] [place], [place], DO

## 2024-08-06 ENCOUNTER — Other Ambulatory Visit: Payer: Self-pay | Admitting: Family Medicine

## 2024-08-07 NOTE — Telephone Encounter (Signed)
 Requested Prescriptions  Pending Prescriptions Disp Refills   rosuvastatin  (CRESTOR ) 20 MG tablet [Pharmacy Med Name: ROSUVASTATIN  CALCIUM  20 MG TAB] 90 tablet 3    Sig: TAKE 1 TABLET BY MOUTH EVERY DAY     Cardiovascular:  Antilipid - Statins 2 Failed - 08/07/2024 12:36 PM      Failed - Lipid Panel in normal range within the last 12 months    Cholesterol, Total  Date Value Ref Range Status  07/24/2024 155 100 - 199 mg/dL Final   Cholesterol Piccolo, Waived  Date Value Ref Range Status  05/21/2016 251 (H) <200 mg/dL Final    Comment:                            Desirable                <200                         Borderline High      200- 239                         High                     >239    LDL Chol Calc (NIH)  Date Value Ref Range Status  07/24/2024 74 0 - 99 mg/dL Final   HDL  Date Value Ref Range Status  07/24/2024 45 >39 mg/dL Final   Triglycerides  Date Value Ref Range Status  07/24/2024 216 (H) 0 - 149 mg/dL Final   Triglycerides Piccolo,Waived  Date Value Ref Range Status  05/21/2016 292 (H) <150 mg/dL Final    Comment:                            Normal                   <150                         Borderline High     150 - 199                         High                200 - 499                         Very High                >499          Passed - Cr in normal range and within 360 days    Creatinine  Date Value Ref Range Status  07/18/2012 1.03 0.60 - 1.30 mg/dL Final   Creatinine, Ser  Date Value Ref Range Status  07/24/2024 1.02 0.76 - 1.27 mg/dL Final         Passed - Patient is not pregnant      Passed - Valid encounter within last 12 months    Recent Outpatient Visits           2 weeks ago Routine general medical examination at a health care facility   [facility], [place] P, DO   6 months ago Primary hypertension  [place] [facility] [place], [place] P, DO   7 months ago Primary  hypertension   [facility] [place], [place], [place]

## 2024-08-29 ENCOUNTER — Other Ambulatory Visit: Payer: Self-pay | Admitting: Family Medicine

## 2024-08-31 NOTE — Telephone Encounter (Signed)
 Requested Prescriptions  Pending Prescriptions Disp Refills   fluticasone  (FLONASE ) 50 MCG/ACT nasal spray [Pharmacy Med Name: FLUTICASONE  PROP 50 MCG SPRAY] 48 mL 1    Sig: SPRAY 2 SPRAYS INTO EACH NOSTRIL EVERY DAY     Ear, Nose, and Throat: Nasal Preparations - Corticosteroids Passed - 08/31/2024  2:19 PM      Passed - Valid encounter within last 12 months    Recent Outpatient Visits           1 month ago Routine general medical examination at a health care facility   [facility], Megan P, DO   7 months ago Primary hypertension   [place] [facility] [place], Megan P, DO   8 months ago Primary hypertension   [facility] [place], [place], DO

## 2024-09-07 ENCOUNTER — Other Ambulatory Visit

## 2024-09-07 DIAGNOSIS — E039 Hypothyroidism, unspecified: Secondary | ICD-10-CM

## 2024-09-08 LAB — TSH: TSH: 2.67 u[IU]/mL (ref 0.450–4.500)

## 2024-09-10 ENCOUNTER — Ambulatory Visit: Payer: Self-pay | Admitting: Family Medicine

## 2024-09-10 MED ORDER — LEVOTHYROXINE SODIUM 50 MCG PO TABS: 50.0000 ug | ORAL_TABLET | Freq: Every day | ORAL | 3 refills | Status: AC

## 2025-01-22 ENCOUNTER — Ambulatory Visit: Admitting: Family Medicine

## 2025-07-16 ENCOUNTER — Ambulatory Visit
